# Patient Record
Sex: Female | Born: 1935 | Race: White | Hispanic: No | Smoking: Never smoker
Health system: Southern US, Community
[De-identification: ages and names within clinical notes are randomized; demographics above are authoritative.]

## PROBLEM LIST (undated history)

## (undated) DIAGNOSIS — E785 Hyperlipidemia, unspecified: Secondary | ICD-10-CM

---

## 2011-08-01 ENCOUNTER — Emergency Department (HOSPITAL_BASED_OUTPATIENT_CLINIC_OR_DEPARTMENT_OTHER)
Admission: EM | Admit: 2011-08-01 | Discharge: 2011-08-01 | Disposition: A | Discharge status: Home / Self Care | Payer: Medicare Other | Attending: Emergency Medicine | Admitting: Emergency Medicine

## 2011-08-01 ENCOUNTER — Encounter (HOSPITAL_BASED_OUTPATIENT_CLINIC_OR_DEPARTMENT_OTHER): Payer: Self-pay | Admitting: Emergency Medicine

## 2011-08-01 DIAGNOSIS — X500XXA Overexertion from strenuous movement or load, initial encounter: Secondary | ICD-10-CM | POA: Insufficient documentation

## 2011-08-01 DIAGNOSIS — IMO0002 Reserved for concepts with insufficient information to code with codable children: Secondary | ICD-10-CM | POA: Insufficient documentation

## 2011-08-01 DIAGNOSIS — S76319A Strain of muscle, fascia and tendon of the posterior muscle group at thigh level, unspecified thigh, initial encounter: Secondary | ICD-10-CM

## 2011-08-01 DIAGNOSIS — Z79899 Other long term (current) drug therapy: Secondary | ICD-10-CM | POA: Insufficient documentation

## 2011-08-01 DIAGNOSIS — Y92009 Unspecified place in unspecified non-institutional (private) residence as the place of occurrence of the external cause: Secondary | ICD-10-CM | POA: Insufficient documentation

## 2011-08-01 DIAGNOSIS — E785 Hyperlipidemia, unspecified: Secondary | ICD-10-CM | POA: Insufficient documentation

## 2011-08-01 HISTORY — DX: Hyperlipidemia, unspecified: E78.5

## 2011-08-01 MED ORDER — DIAZEPAM 5 MG PO TABS: 5.0000 mg | ORAL_TABLET | Freq: Three times a day (TID) | ORAL | Status: AC | PRN

## 2011-08-01 MED ORDER — NAPROXEN 375 MG PO TABS: 375.0000 mg | ORAL_TABLET | Freq: Two times a day (BID) | ORAL | Status: AC

## 2011-08-01 NOTE — ED Notes (Signed)
Pt. Bed 1 complains of upper left leg pain. States that she was outdoors when the injury occurred.

## 2011-08-01 NOTE — ED Provider Notes (Signed)
History     CSN: [id number]  Arrival date & time 08/01/11  1138   First MD Initiated Contact with Patient 08/01/11 1212      Chief Complaint  Patient presents with  . Leg Injury    (Consider location/radiation/quality/duration/timing/severity/associated sxs/prior treatment) HPI Comments: Patient presents for evaluation of left posterior thigh pain, worse with ambulation and movement, that she sustained proximally 4 days ago while she was lifting a bird bath in her yard. The patient has been able to walk subsequently has normal strength in her lower extremity, and denies any back pain. She denies any numbness or weakness in the lower extremity.  Patient is a 78 y.o. female presenting with leg pain. The history is provided by the patient.  Leg Pain  Incident onset: 4 days ago the patient was working in her yard and tried to lift a bird bath, experiencing acute pain in the left buttock and posterior thigh. Pain has been intermittent subsequently. The incident occurred at home. Injury mechanism: Lifting. The pain is present in the left thigh. The quality of the pain is described as aching. The pain is moderate. The pain has been fluctuating since onset. Pertinent negatives include no numbness, no inability to bear weight, no loss of motion, no muscle weakness, no loss of sensation and no tingling. She reports no foreign bodies present. The symptoms are aggravated by activity. She has tried heat and NSAIDs for the symptoms. The treatment provided moderate relief.    Past Medical History  Diagnosis Date  . Hyperlipidemia     History reviewed. No pertinent past surgical history.  History reviewed. No pertinent family history.  History  Substance Use Topics  . Smoking status: Not on file  . Smokeless tobacco: Not on file  . Alcohol Use:     OB History    Grav Para Term Preterm Abortions TAB SAB Ect Mult Living                  Review of Systems  Constitutional: Negative for  activity change and fatigue.  HENT: Negative for neck pain.   Respiratory: Negative for shortness of breath.   Cardiovascular: Negative for chest pain.  Gastrointestinal: Negative for abdominal pain.  Musculoskeletal: Positive for myalgias. Negative for back pain, joint swelling, arthralgias and gait problem.  Skin: Negative.   Neurological: Negative for tingling, weakness and numbness.  Hematological: Does not bruise/bleed easily.  Psychiatric/Behavioral: Negative.     Allergies  Morphine and related  Home Medications   Current Outpatient Rx  Name Route Sig Dispense Refill  . ASPIRIN 81 MG PO TABS Oral Take 81 mg by mouth daily.    Marland Kitchen CALCIUM CITRATE-VITAMIN D 200-200 MG-UNIT PO TABS Oral Take 3 tablets by mouth daily.    Marland Kitchen EZETIMIBE 10 MG PO TABS Oral Take 10 mg by mouth daily.    . OMEGA-3 FATTY ACIDS 1000 MG PO CAPS Oral Take 1,000 mg by mouth 3 (three) times daily.    . MULTI-VITAMIN/MINERALS PO TABS Oral Take 1 tablet by mouth daily.    Marland Kitchen DIAZEPAM 5 MG PO TABS Oral Take 1 tablet (5 mg total) by mouth every 8 (eight) hours as needed (Muscle spasm - use caution if taking this medication as it can cause drowsiness). 12 tablet 0  . NAPROXEN 375 MG PO TABS Oral Take 1 tablet (375 mg total) by mouth 2 (two) times daily with a meal. As needed for pain 20 tablet 0    BP 151/82  Pulse 65  Temp(Src) 97.2 F (36.2 C) (Oral)  Resp 20  Ht 5\' 5"  (1.651 m)  Wt 146 lb (66.225 kg)  BMI 24.30 kg/m2  SpO2 97%  Physical Exam  Nursing note and vitals reviewed. Constitutional: She is oriented to person, place, and time. She appears well-developed and well-nourished. No distress.  HENT:  Head: Normocephalic and atraumatic.  Eyes: EOM are normal. Pupils are equal, round, and reactive to light.  Neck: Normal range of motion. Neck supple.  Cardiovascular: Normal rate, regular rhythm, normal heart sounds and intact distal pulses.   Pulmonary/Chest: Effort normal and breath sounds normal. No  accessory muscle usage. No respiratory distress.  Abdominal: Soft. Normal appearance and bowel sounds are normal. She exhibits no distension. There is no tenderness.  Musculoskeletal: Normal range of motion. She exhibits no edema and no tenderness.       Left upper leg: Normal. She exhibits no tenderness, no bony tenderness, no swelling, no edema and no deformity.  Neurological: She is alert and oriented to person, place, and time. She has normal strength and normal reflexes. She displays normal reflexes. No cranial nerve deficit. She exhibits normal muscle tone. Coordination normal. GCS eye subscore is 4. GCS verbal subscore is 5. GCS motor subscore is 6.  Skin: Skin is warm and dry. No rash noted. No erythema. No pallor.  Psychiatric: She has a normal mood and affect. Her behavior is normal. Judgment and thought content normal.    ED Course  Procedures (including critical care time)  Labs Reviewed - No data to display No results found.   1. Hamstring muscle strain       MDM  The patient has full intact range of motion through all joints in the lower extremity, has normal strength and sensation throughout the lower extremity, ambulates independently without difficulty, antalgic gait, or unsteadiness. She currently is having no pain. Based on the mechanism of injury and where she localizes her pain to, believe this is a muscle strain. I advised her that light activity like walking will help, to avoid heavy lifting over the next 5 days, and we'll treat her with a nonsteroidal anti-inflammatory medication as well as a low-dose muscle relaxant as needed. The patient states her understanding of and agreement with the plan of care.        Felisa Bonier, MD 08/01/11 1323

## 2011-08-01 NOTE — Discharge Instructions (Signed)
Muscle Strain A muscle strain, or pulled muscle, occurs when a muscle is over-stretched. A small number of muscle fibers may also be torn. This is especially common in athletes. This happens when a sudden violent force placed on a muscle pushes it past its capacity. Usually, recovery from a pulled muscle takes 1 to 2 weeks. But complete healing will take 5 to 6 weeks. There are millions of muscle fibers. Following injury, your body will usually return to normal quickly. HOME CARE INSTRUCTIONS   While awake, apply ice to the sore muscle for 15 to 20 minutes each hour for the first 2 days. Put ice in a plastic bag and place a towel between the bag of ice and your skin.   Do not use the pulled muscle for several days. Do not use the muscle if you have pain.   You may wrap the injured area with an elastic bandage for comfort. Be careful not to bind it too tightly. This may interfere with blood circulation.   Only take over-the-counter or prescription medicines for pain, discomfort, or fever as directed by your caregiver. Do not use aspirin as this will increase bleeding (bruising) at injury site.   Warming up before exercise helps prevent muscle strains.  SEEK MEDICAL CARE IF:  There is increased pain or swelling in the affected area. MAKE SURE YOU:   Understand these instructions.   Will watch your condition.   Will get help right away if you are not doing well or get worse.  Document Released: 05/06/2005 Document Revised: 04/25/2011 Document Reviewed: 12/03/2006 [facility] Patient Information 2012 [place], [place].Sprains Sprains are painful injuries to joints as a result of partial or complete tearing of ligaments. HOME CARE INSTRUCTIONS   For the first 24 hours, keep the injured limb raised on 2 pillows while lying down.   Apply ice bags about every 2 hours for 20 to 30 minutes, while awake, to the injured area for the first 24 hours. Then apply as directed by your caregiver. Place the ice  in a plastic bag with a towel around it to prevent frostbite to the skin.   Only take over-the-counter or prescription medicines for pain, discomfort, or fever as directed by your caregiver.   If an ace bandage (a stretchy, elastic wrapping bandage) has been applied today, remove and reapply every 3 to 4 hours. Apply firm enough to keep swelling down. Donot apply tightly. Watch fingers or toes for swelling, bluish discoloration, coldness, numbness, or excessive pain. If any of these problems (symptoms) occur, remove the ace bandage and reapply it more loosely. Contact your caregiver or return to this location if these symptoms persist.  Persistent pain and inability to use the injured area for more than 2 to 3 days are warning signs. See a caregiver for a follow-up visit as soon as possible. A hairline fracture (broken bone) may not show on X-rays. Persistent pain and swelling indicate that further evaluation, use of crutches, and/or more X-rays are needed. X-rays may sometimes not show a small fracture until a week or ten days later. Make a follow-up appointment with your own caregiver or to whom we have referred you. A specialist in reading X-rays(radiologist) will re-read your X-rays. Make sure you know how to obtain your X-ray results. Do not assume everything is normal if you do not hear from [place]. SEEK IMMEDIATE MEDICAL CARE IF:  You develop severe pain or more swelling.   The pain is not controlled with medicine.   Your  skin or nails below the injury turn blue or grey or feel cold or numb.  Document Released: 05/03/2000 Document Revised: 04/25/2011 Document Reviewed: 12/21/2007 [facility] Patient Information 2012 [place], [place].  RESOURCE GUIDE  Dental Problems  Patients with Medicaid: [facility] [phone]                                           [phone] [address] [employer] [address] Phone:  [phone]                                                   Phone:  [phone]  If unable to pay or uninsured, contact:  [facility] or [facility] to become qualified for the adult dental clinic.  Chronic Pain Problems Contact [facility]  [phone] Patients need to be referred by their primary care doctor.  Insufficient Money for Medicine Contact [employer] Way:  call "211" or [facility] 814-734-6077.  No Primary Care Doctor Call [facility]  [phone] Other agencies that provide inexpensive medical care    [facility]  [phone]    [facility]  [phone]    [facility]  [phone]    [facility]  [phone]    [facility]  [phone]    [facility]  [phone]  Psychological Services [facility]  [phone] [facility]  [phone] [facility] Mental [facility]   [phone] (emergency services [phone])  Substance Abuse Resources [facility]  [phone] [facility] [phone] [facility] [phone] Floydene Flock [phone] Residential & Outpatient Substance Abuse Program  [phone]  Abuse/Neglect [facility] Child Abuse Hotline 212-467-9239 [facility] Child Abuse Hotline 831-338-2459 (After Hours)  Emergency Shelter [facility] [phone]  Maternity Homes [facility] at [facility] [place] [facility] [phone] [facility] [phone]  MRSA Hotline #:   714-882-8634    [facility] Resources  [facility] [place]     [employer]                          [facility] Dept. [address] [place]                       [address]      [address]  [place]                                                Cristobal Goldmann Phone:  [phone]  Phone:  [phone]                 Phone:  [phone]  [facility] Phone:   [phone]  [facility] Child Abuse Hotline [phone] (After Hours)

## 2011-11-14 DIAGNOSIS — Z961 Presence of intraocular lens: Secondary | ICD-10-CM | POA: Insufficient documentation

## 2015-05-18 DIAGNOSIS — M3500 Sicca syndrome, unspecified: Secondary | ICD-10-CM | POA: Insufficient documentation

## 2015-05-18 DIAGNOSIS — M810 Age-related osteoporosis without current pathological fracture: Secondary | ICD-10-CM | POA: Insufficient documentation

## 2015-05-18 DIAGNOSIS — F411 Generalized anxiety disorder: Secondary | ICD-10-CM | POA: Insufficient documentation

## 2015-05-18 DIAGNOSIS — N182 Chronic kidney disease, stage 2 (mild): Secondary | ICD-10-CM | POA: Insufficient documentation

## 2018-11-03 ENCOUNTER — Other Ambulatory Visit: Payer: Self-pay

## 2018-11-03 ENCOUNTER — Emergency Department (HOSPITAL_COMMUNITY): Payer: Medicare HMO

## 2018-11-03 ENCOUNTER — Encounter (HOSPITAL_COMMUNITY): Payer: Self-pay | Admitting: Emergency Medicine

## 2018-11-03 ENCOUNTER — Emergency Department (HOSPITAL_COMMUNITY)
Admission: EM | Admit: 2018-11-03 | Discharge: 2018-11-03 | Disposition: A | Discharge status: Home / Self Care | Payer: Medicare HMO | Attending: Emergency Medicine | Admitting: Emergency Medicine

## 2018-11-03 DIAGNOSIS — W01190A Fall on same level from slipping, tripping and stumbling with subsequent striking against furniture, initial encounter: Secondary | ICD-10-CM | POA: Insufficient documentation

## 2018-11-03 DIAGNOSIS — Y939 Activity, unspecified: Secondary | ICD-10-CM | POA: Diagnosis not present

## 2018-11-03 DIAGNOSIS — Y999 Unspecified external cause status: Secondary | ICD-10-CM | POA: Insufficient documentation

## 2018-11-03 DIAGNOSIS — Y929 Unspecified place or not applicable: Secondary | ICD-10-CM | POA: Diagnosis not present

## 2018-11-03 DIAGNOSIS — S20212A Contusion of left front wall of thorax, initial encounter: Secondary | ICD-10-CM | POA: Diagnosis not present

## 2018-11-03 DIAGNOSIS — Z7982 Long term (current) use of aspirin: Secondary | ICD-10-CM | POA: Diagnosis not present

## 2018-11-03 DIAGNOSIS — S29001A Unspecified injury of muscle and tendon of front wall of thorax, initial encounter: Secondary | ICD-10-CM | POA: Diagnosis present

## 2018-11-03 DIAGNOSIS — Z79899 Other long term (current) drug therapy: Secondary | ICD-10-CM | POA: Diagnosis not present

## 2018-11-03 DIAGNOSIS — J449 Chronic obstructive pulmonary disease, unspecified: Secondary | ICD-10-CM | POA: Insufficient documentation

## 2018-11-03 MED ORDER — LIDOCAINE 5 % EX PTCH: 1.0000 | MEDICATED_PATCH | CUTANEOUS | 0 refills | Status: DC

## 2018-11-03 MED ORDER — IBUPROFEN 200 MG PO TABS
600.0000 mg | ORAL_TABLET | Freq: Once | ORAL | Status: AC
  Administered 2018-11-03: 08:00:00 600 mg via ORAL
  Filled 2018-11-03: qty 3

## 2018-11-03 MED ORDER — LIDOCAINE 5 % EX PTCH
1.0000 | MEDICATED_PATCH | CUTANEOUS | Status: DC
  Administered 2018-11-03: 1 via TRANSDERMAL
  Filled 2018-11-03: qty 1

## 2018-11-03 NOTE — Discharge Instructions (Addendum)
There are no rib fractures.  However you likely have contusion to your ribs.  Take lidocaine patch as prescribed.  Continue Motrin at home.  Use incentive spirometry as often as possible.

## 2018-11-03 NOTE — ED Provider Notes (Signed)
[facility] DEPT Provider Note   CSN: [id number] Arrival date & time: 11/03/18  0636    History   Chief Complaint Chief Complaint  Patient presents with  . Fall    HPI Lauren Davidson is a 85 y.o. female.     The history is provided by the patient.  Chest Pain Pain location:  L chest Pain quality: aching   Pain radiates to:  Does not radiate Pain severity:  Mild Onset quality:  Gradual Duration:  3 days Timing:  Intermittent Progression:  Waxing and waning Chronicity:  New Context comment:  Patient hit left side of ribs on a chair after fall three days ago.  Relieved by: motrin  Worsened by:  Deep breathing and movement Associated symptoms: no abdominal pain, no back pain, no cough, no fever, no palpitations, no shortness of breath and no vomiting   Risk factors: high cholesterol     Past Medical History:  Diagnosis Date  . Hyperlipidemia     There are no active problems to display for this patient.   History reviewed. No pertinent surgical history.   OB History   No obstetric history on file.      Home Medications    Prior to Admission medications   Medication Sig Start Date End Date Taking? Authorizing Provider  aspirin 81 MG tablet Take 81 mg by mouth daily.    [provider]  calcium citrate-vitamin D 200-200 MG-UNIT TABS Take 3 tablets by mouth daily.    [provider]  ezetimibe (ZETIA) 10 MG tablet Take 10 mg by mouth daily.    [provider]  fish oil-omega-3 fatty acids 1000 MG capsule Take 1,000 mg by mouth 3 (three) times daily.    [provider]  Multiple Vitamins-Minerals (MULTIVITAMIN WITH MINERALS) tablet Take 1 tablet by mouth daily.    [provider]    Family History History reviewed. No pertinent family history.  Social History Social History   Tobacco Use  . Smoking status: Never Smoker  . Smokeless tobacco: Never Used  Substance Use Topics  .  Alcohol use: Never    Frequency: Never  . Drug use: Never     Allergies   Morphine and related   Review of Systems Review of Systems  Constitutional: Negative for chills and fever.  HENT: Negative for ear pain and sore throat.   Eyes: Negative for pain and visual disturbance.  Respiratory: Negative for cough and shortness of breath.   Cardiovascular: Positive for chest pain. Negative for palpitations.  Gastrointestinal: Negative for abdominal pain and vomiting.  Genitourinary: Negative for dysuria and hematuria.  Musculoskeletal: Negative for arthralgias and back pain.  Skin: Negative for color change and rash.  Neurological: Negative for seizures and syncope.  All other systems reviewed and are negative.    Physical Exam Updated Vital Signs  ED Triage Vitals  Enc Vitals Group     BP 11/03/18 0700 (!) 183/73     Pulse Rate 11/03/18 0700 88     Resp --      Temp --      Temp src --      SpO2 11/03/18 0641 100 %     Weight 11/03/18 0645 140 lb (63.5 kg)     Height 11/03/18 0645 5\' 4"  (1.626 m)     Head Circumference --      Peak Flow --      Pain Score 11/03/18 0645 9  Pain Loc --      Pain Edu? --      Excl. in GC? --     Physical Exam Vitals signs and nursing note reviewed.  Constitutional:      General: She is not in acute distress.    Appearance: She is well-developed.  HENT:     Head: Normocephalic and atraumatic.     Mouth/Throat:     Mouth: Mucous membranes are moist.  Eyes:     Extraocular Movements: Extraocular movements intact.     Conjunctiva/sclera: Conjunctivae normal.     Pupils: Pupils are equal, round, and reactive to light.  Neck:     Musculoskeletal: Normal range of motion and neck supple.  Cardiovascular:     Rate and Rhythm: Normal rate and regular rhythm.     Pulses: Normal pulses.     Heart sounds: Normal heart sounds. No murmur.  Pulmonary:     Effort: Pulmonary effort is normal. No respiratory distress.     Breath sounds:  Normal breath sounds.  Abdominal:     Palpations: Abdomen is soft.     Tenderness: There is no abdominal tenderness.  Musculoskeletal:        General: Tenderness (TTP over anterior left ribs) present.     Comments: No midline spinal tenderness  Skin:    General: Skin is warm and dry.  Neurological:     Mental Status: She is alert.      ED Treatments / Results  Labs (all labs ordered are listed, but only abnormal results are displayed) Labs Reviewed - No data to display  EKG None  Radiology Dg Chest 2 View  Result Date: 11/03/2018 CLINICAL DATA:  Fall.  Left lower rib pain. EXAM: CHEST - 2 VIEW COMPARISON:  Chest 05/26/2017.  Thoracic spine 12/18/2016 FINDINGS: Heart size mildly enlarged. Negative for heart failure. Lungs are hyperinflated with changes of COPD and mild scarring. No acute infiltrate or effusion. Atherosclerotic aortic arch. Chronic fracture deformity right humerus Moderate compression fracture approximately T6 was not present on 12/18/2016. No other thoracic fractures. No displaced rib fracture. IMPRESSION: COPD and mild scarring.  No acute cardiopulmonary abnormality Moderate compression fracture approximately T6 of indeterminate age but not present in 2018. Electronically Signed   By: Marlan Palauharles  Clark M.D.   On: 11/03/2018 08:05    Procedures Procedures (including critical care time)  Medications Ordered in ED Medications  lidocaine (LIDODERM) 5 % 1 patch (1 patch Transdermal Patch Applied 11/03/18 0717)  ibuprofen (ADVIL) tablet 600 mg (600 mg Oral Given 11/03/18 [id number])     Initial Impression / Assessment and Plan / ED Course  I have reviewed the triage vital signs and the nursing notes.  Pertinent labs & imaging results that were available during my care of the patient were reviewed by me and considered in my medical decision making (see chart for details).     Lauren Davidson is a 85 year old female with history of COPD who presents to the ED after fall  with left-sided rib pain.  Patient with normal vitals.  No fever.  Patient fell 3 days ago and has had ongoing left-sided rib pain especially when she moves or takes a deep breath.  Patient with no signs of respiratory distress.  Equal breath sounds bilaterally.  Chest x-ray showed no fracture, no pneumothorax.  She has age-indeterminate T6 compression fracture that has not been previously seen however she has no tenderness in this area.  Patient felt better after Motrin, lidocaine patch.  Will prescribe lidocaine patches.  She has been taking Motrin at home.  Discharged in the ED in good condition and told to return to the ED if symptoms worsen.  Patient likely with rib contusion. Incentive spiro ordered.  This chart was dictated using voice recognition software.  Despite best efforts to proofread,  errors can occur which can change the documentation meaning.    Final Clinical Impressions(s) / ED Diagnoses   Final diagnoses:  Contusion of rib on left side, initial encounter    ED Discharge Orders    None       Virgina NorfolkCuratolo, Kimm Ungaro, DO 11/03/18 [id number]

## 2018-11-03 NOTE — ED Notes (Signed)
[facility] has been contacted regarding patient transport.  

## 2018-11-03 NOTE — ED Notes (Signed)
Bed: WA21 Expected date:  Expected time:  Means of arrival:  Comments: 85 yo F/ left rib pain from fall

## 2018-11-03 NOTE — ED Triage Notes (Addendum)
[facility] transported pt from [employer] [facility] [place] to [facility] ED and reports the following:  Pt fell on a chair on 11/01/18, since then the pain has increased to a sharp 9/10 rib pain on left side. No deformity or swelling. Abdomen soft and non-tender. Ambulatory, can move around.

## 2019-03-01 DIAGNOSIS — K219 Gastro-esophageal reflux disease without esophagitis: Secondary | ICD-10-CM | POA: Insufficient documentation

## 2020-05-23 DIAGNOSIS — Z20828 Contact with and (suspected) exposure to other viral communicable diseases: Secondary | ICD-10-CM | POA: Diagnosis not present

## 2020-05-23 DIAGNOSIS — Z1159 Encounter for screening for other viral diseases: Secondary | ICD-10-CM | POA: Diagnosis not present

## 2020-05-26 DIAGNOSIS — Z20828 Contact with and (suspected) exposure to other viral communicable diseases: Secondary | ICD-10-CM | POA: Diagnosis not present

## 2020-05-26 DIAGNOSIS — Z1159 Encounter for screening for other viral diseases: Secondary | ICD-10-CM | POA: Diagnosis not present

## 2020-06-01 DIAGNOSIS — Z20828 Contact with and (suspected) exposure to other viral communicable diseases: Secondary | ICD-10-CM | POA: Diagnosis not present

## 2020-06-06 DIAGNOSIS — Z20828 Contact with and (suspected) exposure to other viral communicable diseases: Secondary | ICD-10-CM | POA: Diagnosis not present

## 2020-06-08 DIAGNOSIS — Z20828 Contact with and (suspected) exposure to other viral communicable diseases: Secondary | ICD-10-CM | POA: Diagnosis not present

## 2020-06-08 DIAGNOSIS — R4789 Other speech disturbances: Secondary | ICD-10-CM | POA: Diagnosis not present

## 2020-06-09 DIAGNOSIS — R4789 Other speech disturbances: Secondary | ICD-10-CM | POA: Diagnosis not present

## 2020-06-12 DIAGNOSIS — H353131 Nonexudative age-related macular degeneration, bilateral, early dry stage: Secondary | ICD-10-CM | POA: Diagnosis not present

## 2020-06-12 DIAGNOSIS — H35343 Macular cyst, hole, or pseudohole, bilateral: Secondary | ICD-10-CM | POA: Diagnosis not present

## 2020-06-12 DIAGNOSIS — R4789 Other speech disturbances: Secondary | ICD-10-CM | POA: Diagnosis not present

## 2020-06-12 DIAGNOSIS — Z20828 Contact with and (suspected) exposure to other viral communicable diseases: Secondary | ICD-10-CM | POA: Diagnosis not present

## 2020-06-12 DIAGNOSIS — H35373 Puckering of macula, bilateral: Secondary | ICD-10-CM | POA: Diagnosis not present

## 2020-06-13 DIAGNOSIS — R4789 Other speech disturbances: Secondary | ICD-10-CM | POA: Diagnosis not present

## 2020-06-15 DIAGNOSIS — Z20828 Contact with and (suspected) exposure to other viral communicable diseases: Secondary | ICD-10-CM | POA: Diagnosis not present

## 2020-06-15 DIAGNOSIS — R4789 Other speech disturbances: Secondary | ICD-10-CM | POA: Diagnosis not present

## 2020-06-19 DIAGNOSIS — Z20828 Contact with and (suspected) exposure to other viral communicable diseases: Secondary | ICD-10-CM | POA: Diagnosis not present

## 2020-06-19 DIAGNOSIS — R4789 Other speech disturbances: Secondary | ICD-10-CM | POA: Diagnosis not present

## 2020-06-20 DIAGNOSIS — R4789 Other speech disturbances: Secondary | ICD-10-CM | POA: Diagnosis not present

## 2020-06-22 DIAGNOSIS — R4789 Other speech disturbances: Secondary | ICD-10-CM | POA: Diagnosis not present

## 2020-06-22 DIAGNOSIS — Z20828 Contact with and (suspected) exposure to other viral communicable diseases: Secondary | ICD-10-CM | POA: Diagnosis not present

## 2020-06-23 ENCOUNTER — Other Ambulatory Visit: Payer: Self-pay

## 2020-06-23 ENCOUNTER — Ambulatory Visit (INDEPENDENT_AMBULATORY_CARE_PROVIDER_SITE_OTHER): Payer: Medicare HMO | Admitting: Podiatry

## 2020-06-23 DIAGNOSIS — B351 Tinea unguium: Secondary | ICD-10-CM

## 2020-06-23 DIAGNOSIS — M79675 Pain in left toe(s): Secondary | ICD-10-CM | POA: Diagnosis not present

## 2020-06-23 DIAGNOSIS — M79674 Pain in right toe(s): Secondary | ICD-10-CM

## 2020-06-26 DIAGNOSIS — R4789 Other speech disturbances: Secondary | ICD-10-CM | POA: Diagnosis not present

## 2020-06-26 DIAGNOSIS — Z20828 Contact with and (suspected) exposure to other viral communicable diseases: Secondary | ICD-10-CM | POA: Diagnosis not present

## 2020-06-27 ENCOUNTER — Encounter: Payer: Self-pay | Admitting: Podiatry

## 2020-06-27 DIAGNOSIS — I340 Nonrheumatic mitral (valve) insufficiency: Secondary | ICD-10-CM | POA: Diagnosis not present

## 2020-06-27 DIAGNOSIS — N1831 Chronic kidney disease, stage 3a: Secondary | ICD-10-CM | POA: Diagnosis not present

## 2020-06-27 NOTE — Progress Notes (Signed)
  Subjective:  Patient ID: Lauren Davidson, female    DOB: 1921/10/07,  MRN: [id number]  Chief Complaint  Patient presents with  . Nail Problem    Bilateral hallux nails possible ingrown Right hallux nail is curving inward    87 y.o. female returns for the above complaint.  Patient presents with thickened elongated dystrophic toenails x10.  Patient states they are ingrowing a little been causing her pain.  She would like to have the nails debrided down.  She is not able to do it herself.  She denies any other acute complaints.  She has not seen anyone else prior to see me.  She is not a diabetic  Objective:  There were no vitals filed for this visit. Podiatric Exam: Vascular: dorsalis pedis and posterior tibial pulses are palpable bilateral. Capillary return is immediate. Temperature gradient is WNL. Skin turgor WNL  Sensorium: Normal Semmes Weinstein monofilament test. Normal tactile sensation bilaterally. Nail Exam: Pt has thick disfigured discolored nails with subungual debris noted bilateral entire nail hallux through fifth toenails.  Pain on palpation to the nails. Ulcer Exam: There is no evidence of ulcer or pre-ulcerative changes or infection. Orthopedic Exam: Muscle tone and strength are WNL. No limitations in general ROM. No crepitus or effusions noted. HAV  B/L.  Hammer toes 2-5  B/L. Skin: No Porokeratosis. No infection or ulcers    Assessment & Plan:   1. Pain due to onychomycosis of toenails of both feet     Patient was evaluated and treated and all questions answered.  Onychomycosis with pain  -Nails palliatively debrided as below. -Educated on self-care  Procedure: Nail Debridement Rationale: pain  Type of Debridement: manual, sharp debridement. Instrumentation: Nail nipper, rotary burr. Number of Nails: 10  Procedures and Treatment: Consent by patient was obtained for treatment procedures. The patient understood the discussion of treatment and procedures well. All  questions were answered thoroughly reviewed. Debridement of mycotic and hypertrophic toenails, 1 through 5 bilateral and clearing of subungual debris. No ulceration, no infection noted.  Return Visit-Office Procedure: Patient instructed to return to the office for a follow up visit 3 months for continued evaluation and treatment.  Nicholes Rough, DPM    No follow-ups on file.

## 2020-06-28 DIAGNOSIS — R4789 Other speech disturbances: Secondary | ICD-10-CM | POA: Diagnosis not present

## 2020-06-29 DIAGNOSIS — R4789 Other speech disturbances: Secondary | ICD-10-CM | POA: Diagnosis not present

## 2020-06-29 DIAGNOSIS — Z20828 Contact with and (suspected) exposure to other viral communicable diseases: Secondary | ICD-10-CM | POA: Diagnosis not present

## 2020-07-03 DIAGNOSIS — Z20828 Contact with and (suspected) exposure to other viral communicable diseases: Secondary | ICD-10-CM | POA: Diagnosis not present

## 2020-07-04 DIAGNOSIS — R4789 Other speech disturbances: Secondary | ICD-10-CM | POA: Diagnosis not present

## 2020-07-06 DIAGNOSIS — R4789 Other speech disturbances: Secondary | ICD-10-CM | POA: Diagnosis not present

## 2020-07-06 DIAGNOSIS — Z20828 Contact with and (suspected) exposure to other viral communicable diseases: Secondary | ICD-10-CM | POA: Diagnosis not present

## 2020-07-10 DIAGNOSIS — Z20828 Contact with and (suspected) exposure to other viral communicable diseases: Secondary | ICD-10-CM | POA: Diagnosis not present

## 2020-07-11 DIAGNOSIS — R4789 Other speech disturbances: Secondary | ICD-10-CM | POA: Diagnosis not present

## 2020-07-12 DIAGNOSIS — M6281 Muscle weakness (generalized): Secondary | ICD-10-CM | POA: Diagnosis not present

## 2020-07-13 DIAGNOSIS — Z20828 Contact with and (suspected) exposure to other viral communicable diseases: Secondary | ICD-10-CM | POA: Diagnosis not present

## 2020-07-13 DIAGNOSIS — R4789 Other speech disturbances: Secondary | ICD-10-CM | POA: Diagnosis not present

## 2020-07-17 DIAGNOSIS — Z20828 Contact with and (suspected) exposure to other viral communicable diseases: Secondary | ICD-10-CM | POA: Diagnosis not present

## 2020-07-17 DIAGNOSIS — M6281 Muscle weakness (generalized): Secondary | ICD-10-CM | POA: Diagnosis not present

## 2020-07-18 DIAGNOSIS — R4789 Other speech disturbances: Secondary | ICD-10-CM | POA: Diagnosis not present

## 2020-07-19 DIAGNOSIS — M6281 Muscle weakness (generalized): Secondary | ICD-10-CM | POA: Diagnosis not present

## 2020-07-20 DIAGNOSIS — Z20828 Contact with and (suspected) exposure to other viral communicable diseases: Secondary | ICD-10-CM | POA: Diagnosis not present

## 2020-07-20 DIAGNOSIS — R4789 Other speech disturbances: Secondary | ICD-10-CM | POA: Diagnosis not present

## 2020-07-21 DIAGNOSIS — M6281 Muscle weakness (generalized): Secondary | ICD-10-CM | POA: Diagnosis not present

## 2020-07-24 DIAGNOSIS — M6281 Muscle weakness (generalized): Secondary | ICD-10-CM | POA: Diagnosis not present

## 2020-07-24 DIAGNOSIS — Z20828 Contact with and (suspected) exposure to other viral communicable diseases: Secondary | ICD-10-CM | POA: Diagnosis not present

## 2020-07-25 DIAGNOSIS — R4789 Other speech disturbances: Secondary | ICD-10-CM | POA: Diagnosis not present

## 2020-07-26 DIAGNOSIS — M6281 Muscle weakness (generalized): Secondary | ICD-10-CM | POA: Diagnosis not present

## 2020-07-27 DIAGNOSIS — R4789 Other speech disturbances: Secondary | ICD-10-CM | POA: Diagnosis not present

## 2020-07-28 DIAGNOSIS — M6281 Muscle weakness (generalized): Secondary | ICD-10-CM | POA: Diagnosis not present

## 2020-08-02 DIAGNOSIS — M6281 Muscle weakness (generalized): Secondary | ICD-10-CM | POA: Diagnosis not present

## 2020-08-07 DIAGNOSIS — Z20828 Contact with and (suspected) exposure to other viral communicable diseases: Secondary | ICD-10-CM | POA: Diagnosis not present

## 2020-08-09 DIAGNOSIS — M6281 Muscle weakness (generalized): Secondary | ICD-10-CM | POA: Diagnosis not present

## 2020-08-14 DIAGNOSIS — Z20828 Contact with and (suspected) exposure to other viral communicable diseases: Secondary | ICD-10-CM | POA: Diagnosis not present

## 2020-08-14 DIAGNOSIS — M6281 Muscle weakness (generalized): Secondary | ICD-10-CM | POA: Diagnosis not present

## 2020-08-16 DIAGNOSIS — M6281 Muscle weakness (generalized): Secondary | ICD-10-CM | POA: Diagnosis not present

## 2020-08-18 DIAGNOSIS — M6281 Muscle weakness (generalized): Secondary | ICD-10-CM | POA: Diagnosis not present

## 2020-08-21 DIAGNOSIS — Z20828 Contact with and (suspected) exposure to other viral communicable diseases: Secondary | ICD-10-CM | POA: Diagnosis not present

## 2020-08-28 DIAGNOSIS — Z20828 Contact with and (suspected) exposure to other viral communicable diseases: Secondary | ICD-10-CM | POA: Diagnosis not present

## 2020-09-04 DIAGNOSIS — Z20828 Contact with and (suspected) exposure to other viral communicable diseases: Secondary | ICD-10-CM | POA: Diagnosis not present

## 2020-09-11 DIAGNOSIS — Z20828 Contact with and (suspected) exposure to other viral communicable diseases: Secondary | ICD-10-CM | POA: Diagnosis not present

## 2020-09-18 DIAGNOSIS — Z20828 Contact with and (suspected) exposure to other viral communicable diseases: Secondary | ICD-10-CM | POA: Diagnosis not present

## 2020-09-20 ENCOUNTER — Ambulatory Visit: Payer: Medicare HMO | Admitting: Podiatry

## 2020-09-25 DIAGNOSIS — Z20828 Contact with and (suspected) exposure to other viral communicable diseases: Secondary | ICD-10-CM | POA: Diagnosis not present

## 2020-09-29 DIAGNOSIS — N1831 Chronic kidney disease, stage 3a: Secondary | ICD-10-CM | POA: Diagnosis not present

## 2020-09-29 DIAGNOSIS — E538 Deficiency of other specified B group vitamins: Secondary | ICD-10-CM | POA: Diagnosis not present

## 2020-09-29 DIAGNOSIS — N393 Stress incontinence (female) (male): Secondary | ICD-10-CM | POA: Diagnosis not present

## 2020-09-29 DIAGNOSIS — I340 Nonrheumatic mitral (valve) insufficiency: Secondary | ICD-10-CM | POA: Diagnosis not present

## 2020-10-02 DIAGNOSIS — Z20828 Contact with and (suspected) exposure to other viral communicable diseases: Secondary | ICD-10-CM | POA: Diagnosis not present

## 2020-10-09 DIAGNOSIS — Z20828 Contact with and (suspected) exposure to other viral communicable diseases: Secondary | ICD-10-CM | POA: Diagnosis not present

## 2020-10-16 DIAGNOSIS — Z20828 Contact with and (suspected) exposure to other viral communicable diseases: Secondary | ICD-10-CM | POA: Diagnosis not present

## 2020-10-23 DIAGNOSIS — Z20828 Contact with and (suspected) exposure to other viral communicable diseases: Secondary | ICD-10-CM | POA: Diagnosis not present

## 2020-10-26 DIAGNOSIS — Z20828 Contact with and (suspected) exposure to other viral communicable diseases: Secondary | ICD-10-CM | POA: Diagnosis not present

## 2020-10-30 DIAGNOSIS — Z20828 Contact with and (suspected) exposure to other viral communicable diseases: Secondary | ICD-10-CM | POA: Diagnosis not present

## 2020-10-31 DIAGNOSIS — L570 Actinic keratosis: Secondary | ICD-10-CM | POA: Diagnosis not present

## 2020-10-31 DIAGNOSIS — Z859 Personal history of malignant neoplasm, unspecified: Secondary | ICD-10-CM | POA: Diagnosis not present

## 2020-10-31 DIAGNOSIS — L820 Inflamed seborrheic keratosis: Secondary | ICD-10-CM | POA: Diagnosis not present

## 2020-10-31 DIAGNOSIS — Z85828 Personal history of other malignant neoplasm of skin: Secondary | ICD-10-CM | POA: Diagnosis not present

## 2020-10-31 DIAGNOSIS — D1801 Hemangioma of skin and subcutaneous tissue: Secondary | ICD-10-CM | POA: Diagnosis not present

## 2020-10-31 DIAGNOSIS — L821 Other seborrheic keratosis: Secondary | ICD-10-CM | POA: Diagnosis not present

## 2020-11-02 DIAGNOSIS — Z20828 Contact with and (suspected) exposure to other viral communicable diseases: Secondary | ICD-10-CM | POA: Diagnosis not present

## 2020-11-06 DIAGNOSIS — Z20828 Contact with and (suspected) exposure to other viral communicable diseases: Secondary | ICD-10-CM | POA: Diagnosis not present

## 2020-11-09 DIAGNOSIS — Z20828 Contact with and (suspected) exposure to other viral communicable diseases: Secondary | ICD-10-CM | POA: Diagnosis not present

## 2020-11-13 DIAGNOSIS — Z20828 Contact with and (suspected) exposure to other viral communicable diseases: Secondary | ICD-10-CM | POA: Diagnosis not present

## 2020-11-16 DIAGNOSIS — Z20828 Contact with and (suspected) exposure to other viral communicable diseases: Secondary | ICD-10-CM | POA: Diagnosis not present

## 2020-11-21 DIAGNOSIS — Z20828 Contact with and (suspected) exposure to other viral communicable diseases: Secondary | ICD-10-CM | POA: Diagnosis not present

## 2020-11-23 DIAGNOSIS — Z20828 Contact with and (suspected) exposure to other viral communicable diseases: Secondary | ICD-10-CM | POA: Diagnosis not present

## 2020-11-27 DIAGNOSIS — Z20828 Contact with and (suspected) exposure to other viral communicable diseases: Secondary | ICD-10-CM | POA: Diagnosis not present

## 2020-12-04 DIAGNOSIS — Z20828 Contact with and (suspected) exposure to other viral communicable diseases: Secondary | ICD-10-CM | POA: Diagnosis not present

## 2020-12-18 DIAGNOSIS — Z20828 Contact with and (suspected) exposure to other viral communicable diseases: Secondary | ICD-10-CM | POA: Diagnosis not present

## 2020-12-21 DIAGNOSIS — Z20828 Contact with and (suspected) exposure to other viral communicable diseases: Secondary | ICD-10-CM | POA: Diagnosis not present

## 2020-12-25 DIAGNOSIS — Z20828 Contact with and (suspected) exposure to other viral communicable diseases: Secondary | ICD-10-CM | POA: Diagnosis not present

## 2020-12-28 DIAGNOSIS — Z20828 Contact with and (suspected) exposure to other viral communicable diseases: Secondary | ICD-10-CM | POA: Diagnosis not present

## 2021-01-01 DIAGNOSIS — I340 Nonrheumatic mitral (valve) insufficiency: Secondary | ICD-10-CM | POA: Diagnosis not present

## 2021-01-01 DIAGNOSIS — N1831 Chronic kidney disease, stage 3a: Secondary | ICD-10-CM | POA: Diagnosis not present

## 2021-01-01 DIAGNOSIS — H903 Sensorineural hearing loss, bilateral: Secondary | ICD-10-CM | POA: Diagnosis not present

## 2021-01-01 DIAGNOSIS — R682 Dry mouth, unspecified: Secondary | ICD-10-CM | POA: Diagnosis not present

## 2021-01-01 DIAGNOSIS — R531 Weakness: Secondary | ICD-10-CM | POA: Diagnosis not present

## 2021-01-08 DIAGNOSIS — Z20828 Contact with and (suspected) exposure to other viral communicable diseases: Secondary | ICD-10-CM | POA: Diagnosis not present

## 2021-01-11 DIAGNOSIS — Z8616 Personal history of COVID-19: Secondary | ICD-10-CM | POA: Diagnosis not present

## 2021-01-12 DIAGNOSIS — H35373 Puckering of macula, bilateral: Secondary | ICD-10-CM | POA: Diagnosis not present

## 2021-01-12 DIAGNOSIS — H5203 Hypermetropia, bilateral: Secondary | ICD-10-CM | POA: Diagnosis not present

## 2021-01-12 DIAGNOSIS — Z961 Presence of intraocular lens: Secondary | ICD-10-CM | POA: Diagnosis not present

## 2021-01-12 DIAGNOSIS — H04123 Dry eye syndrome of bilateral lacrimal glands: Secondary | ICD-10-CM | POA: Diagnosis not present

## 2021-01-15 DIAGNOSIS — Z20828 Contact with and (suspected) exposure to other viral communicable diseases: Secondary | ICD-10-CM | POA: Diagnosis not present

## 2021-01-18 DIAGNOSIS — Z20828 Contact with and (suspected) exposure to other viral communicable diseases: Secondary | ICD-10-CM | POA: Diagnosis not present

## 2021-01-22 DIAGNOSIS — Z20828 Contact with and (suspected) exposure to other viral communicable diseases: Secondary | ICD-10-CM | POA: Diagnosis not present

## 2021-01-25 DIAGNOSIS — Z20828 Contact with and (suspected) exposure to other viral communicable diseases: Secondary | ICD-10-CM | POA: Diagnosis not present

## 2021-02-01 DIAGNOSIS — Z20828 Contact with and (suspected) exposure to other viral communicable diseases: Secondary | ICD-10-CM | POA: Diagnosis not present

## 2021-02-01 DIAGNOSIS — L820 Inflamed seborrheic keratosis: Secondary | ICD-10-CM | POA: Diagnosis not present

## 2021-02-08 DIAGNOSIS — L820 Inflamed seborrheic keratosis: Secondary | ICD-10-CM | POA: Diagnosis not present

## 2021-02-08 DIAGNOSIS — Z20828 Contact with and (suspected) exposure to other viral communicable diseases: Secondary | ICD-10-CM | POA: Diagnosis not present

## 2021-02-08 DIAGNOSIS — L570 Actinic keratosis: Secondary | ICD-10-CM | POA: Diagnosis not present

## 2021-02-15 DIAGNOSIS — Z8616 Personal history of COVID-19: Secondary | ICD-10-CM | POA: Diagnosis not present

## 2021-02-22 DIAGNOSIS — Z8616 Personal history of COVID-19: Secondary | ICD-10-CM | POA: Diagnosis not present

## 2021-03-01 DIAGNOSIS — Z20828 Contact with and (suspected) exposure to other viral communicable diseases: Secondary | ICD-10-CM | POA: Diagnosis not present

## 2021-04-06 DIAGNOSIS — Z79899 Other long term (current) drug therapy: Secondary | ICD-10-CM | POA: Diagnosis not present

## 2021-04-06 DIAGNOSIS — M3500 Sicca syndrome, unspecified: Secondary | ICD-10-CM | POA: Diagnosis not present

## 2021-04-06 DIAGNOSIS — I340 Nonrheumatic mitral (valve) insufficiency: Secondary | ICD-10-CM | POA: Diagnosis not present

## 2021-04-06 DIAGNOSIS — N1831 Chronic kidney disease, stage 3a: Secondary | ICD-10-CM | POA: Diagnosis not present

## 2021-04-06 DIAGNOSIS — E538 Deficiency of other specified B group vitamins: Secondary | ICD-10-CM | POA: Diagnosis not present

## 2021-04-06 DIAGNOSIS — Z0000 Encounter for general adult medical examination without abnormal findings: Secondary | ICD-10-CM | POA: Diagnosis not present

## 2021-04-06 DIAGNOSIS — Z1389 Encounter for screening for other disorder: Secondary | ICD-10-CM | POA: Diagnosis not present

## 2021-04-06 DIAGNOSIS — F411 Generalized anxiety disorder: Secondary | ICD-10-CM | POA: Diagnosis not present

## 2021-04-06 DIAGNOSIS — H903 Sensorineural hearing loss, bilateral: Secondary | ICD-10-CM | POA: Diagnosis not present

## 2021-04-24 DIAGNOSIS — Z20822 Contact with and (suspected) exposure to covid-19: Secondary | ICD-10-CM | POA: Diagnosis not present

## 2021-04-24 DIAGNOSIS — Z85828 Personal history of other malignant neoplasm of skin: Secondary | ICD-10-CM | POA: Diagnosis not present

## 2021-05-31 DIAGNOSIS — H903 Sensorineural hearing loss, bilateral: Secondary | ICD-10-CM | POA: Diagnosis not present

## 2021-05-31 DIAGNOSIS — J384 Edema of larynx: Secondary | ICD-10-CM | POA: Diagnosis not present

## 2021-05-31 DIAGNOSIS — R490 Dysphonia: Secondary | ICD-10-CM | POA: Diagnosis not present

## 2021-05-31 DIAGNOSIS — Z974 Presence of external hearing-aid: Secondary | ICD-10-CM | POA: Diagnosis not present

## 2021-07-11 DIAGNOSIS — R030 Elevated blood-pressure reading, without diagnosis of hypertension: Secondary | ICD-10-CM | POA: Diagnosis not present

## 2021-07-11 DIAGNOSIS — F419 Anxiety disorder, unspecified: Secondary | ICD-10-CM | POA: Diagnosis not present

## 2021-07-11 DIAGNOSIS — N1831 Chronic kidney disease, stage 3a: Secondary | ICD-10-CM | POA: Diagnosis not present

## 2021-07-11 DIAGNOSIS — I340 Nonrheumatic mitral (valve) insufficiency: Secondary | ICD-10-CM | POA: Diagnosis not present

## 2021-07-11 DIAGNOSIS — M3500 Sicca syndrome, unspecified: Secondary | ICD-10-CM | POA: Diagnosis not present

## 2021-07-13 DIAGNOSIS — H35373 Puckering of macula, bilateral: Secondary | ICD-10-CM | POA: Diagnosis not present

## 2021-07-13 DIAGNOSIS — H04123 Dry eye syndrome of bilateral lacrimal glands: Secondary | ICD-10-CM | POA: Diagnosis not present

## 2021-08-07 DIAGNOSIS — H903 Sensorineural hearing loss, bilateral: Secondary | ICD-10-CM | POA: Diagnosis not present

## 2021-08-28 DIAGNOSIS — H903 Sensorineural hearing loss, bilateral: Secondary | ICD-10-CM | POA: Diagnosis not present

## 2021-09-07 ENCOUNTER — Ambulatory Visit
Admission: RE | Admit: 2021-09-07 | Discharge: 2021-09-07 | Disposition: A | Discharge status: Home / Self Care | Payer: Medicare HMO | Source: Ambulatory Visit | Attending: Internal Medicine | Admitting: Internal Medicine

## 2021-09-07 ENCOUNTER — Other Ambulatory Visit: Payer: Self-pay | Admitting: Internal Medicine

## 2021-09-07 DIAGNOSIS — M545 Low back pain, unspecified: Secondary | ICD-10-CM

## 2021-09-17 ENCOUNTER — Emergency Department (HOSPITAL_COMMUNITY): Payer: Medicare HMO

## 2021-09-17 ENCOUNTER — Encounter (HOSPITAL_COMMUNITY): Payer: Self-pay

## 2021-09-17 ENCOUNTER — Observation Stay (HOSPITAL_COMMUNITY)
Admission: EM | Admit: 2021-09-17 | Discharge: 2021-09-20 | Disposition: A | Discharge status: Skilled Nursing Facility | Payer: Medicare HMO | Attending: Internal Medicine | Admitting: Internal Medicine

## 2021-09-17 ENCOUNTER — Other Ambulatory Visit: Payer: Self-pay

## 2021-09-17 DIAGNOSIS — R52 Pain, unspecified: Secondary | ICD-10-CM | POA: Diagnosis present

## 2021-09-17 DIAGNOSIS — X58XXXA Exposure to other specified factors, initial encounter: Secondary | ICD-10-CM | POA: Diagnosis not present

## 2021-09-17 DIAGNOSIS — I700 Atherosclerosis of aorta: Secondary | ICD-10-CM | POA: Diagnosis not present

## 2021-09-17 DIAGNOSIS — M810 Age-related osteoporosis without current pathological fracture: Secondary | ICD-10-CM | POA: Diagnosis not present

## 2021-09-17 DIAGNOSIS — M5126 Other intervertebral disc displacement, lumbar region: Secondary | ICD-10-CM | POA: Insufficient documentation

## 2021-09-17 DIAGNOSIS — Z7982 Long term (current) use of aspirin: Secondary | ICD-10-CM | POA: Diagnosis not present

## 2021-09-17 DIAGNOSIS — E785 Hyperlipidemia, unspecified: Secondary | ICD-10-CM | POA: Insufficient documentation

## 2021-09-17 DIAGNOSIS — Z79899 Other long term (current) drug therapy: Secondary | ICD-10-CM | POA: Diagnosis not present

## 2021-09-17 DIAGNOSIS — R2681 Unsteadiness on feet: Secondary | ICD-10-CM | POA: Insufficient documentation

## 2021-09-17 DIAGNOSIS — M25551 Pain in right hip: Secondary | ICD-10-CM | POA: Diagnosis not present

## 2021-09-17 DIAGNOSIS — M48061 Spinal stenosis, lumbar region without neurogenic claudication: Secondary | ICD-10-CM | POA: Diagnosis not present

## 2021-09-17 DIAGNOSIS — E559 Vitamin D deficiency, unspecified: Secondary | ICD-10-CM | POA: Insufficient documentation

## 2021-09-17 DIAGNOSIS — M549 Dorsalgia, unspecified: Secondary | ICD-10-CM | POA: Diagnosis present

## 2021-09-17 DIAGNOSIS — M545 Low back pain, unspecified: Secondary | ICD-10-CM | POA: Insufficient documentation

## 2021-09-17 DIAGNOSIS — S32040A Wedge compression fracture of fourth lumbar vertebra, initial encounter for closed fracture: Principal | ICD-10-CM | POA: Insufficient documentation

## 2021-09-17 DIAGNOSIS — I10 Essential (primary) hypertension: Secondary | ICD-10-CM | POA: Diagnosis present

## 2021-09-17 DIAGNOSIS — M25559 Pain in unspecified hip: Secondary | ICD-10-CM | POA: Diagnosis present

## 2021-09-17 DIAGNOSIS — Z743 Need for continuous supervision: Secondary | ICD-10-CM | POA: Diagnosis not present

## 2021-09-17 HISTORY — DX: Wedge compression fracture of fourth lumbar vertebra, initial encounter for closed fracture: S32.040A

## 2021-09-17 LAB — CBC WITH DIFFERENTIAL/PLATELET
Abs Immature Granulocytes: 0.03 10*3/uL (ref 0.00–0.07)
Basophils Absolute: 0.1 10*3/uL (ref 0.0–0.1)
Basophils Relative: 1 %
Eosinophils Absolute: 0.1 10*3/uL (ref 0.0–0.5)
Eosinophils Relative: 2 %
HCT: 42.4 % (ref 36.0–46.0)
Hemoglobin: 14.6 g/dL (ref 12.0–15.0)
Immature Granulocytes: 0 %
Lymphocytes Relative: 12 %
Lymphs Abs: 1.1 10*3/uL (ref 0.7–4.0)
MCH: 34.8 pg — ABNORMAL HIGH (ref 26.0–34.0)
MCHC: 34.4 g/dL (ref 30.0–36.0)
MCV: 101.2 fL — ABNORMAL HIGH (ref 80.0–100.0)
Monocytes Absolute: 0.9 10*3/uL (ref 0.1–1.0)
Monocytes Relative: 10 %
Neutro Abs: 7.1 10*3/uL (ref 1.7–7.7)
Neutrophils Relative %: 75 %
Platelets: 238 10*3/uL (ref 150–400)
RBC: 4.19 MIL/uL (ref 3.87–5.11)
RDW: 12.8 % (ref 11.5–15.5)
WBC: 9.2 10*3/uL (ref 4.0–10.5)
nRBC: 0 % (ref 0.0–0.2)

## 2021-09-17 LAB — URINALYSIS, ROUTINE W REFLEX MICROSCOPIC
Bilirubin Urine: NEGATIVE
Glucose, UA: NEGATIVE mg/dL
Hgb urine dipstick: NEGATIVE
Ketones, ur: NEGATIVE mg/dL
Leukocytes,Ua: NEGATIVE
Nitrite: NEGATIVE
Protein, ur: NEGATIVE mg/dL
Specific Gravity, Urine: 1.009 (ref 1.005–1.030)
pH: 8 (ref 5.0–8.0)

## 2021-09-17 LAB — BASIC METABOLIC PANEL
Anion gap: 8 (ref 5–15)
BUN: 22 mg/dL (ref 8–23)
CO2: 27 mmol/L (ref 22–32)
Calcium: 9.1 mg/dL (ref 8.9–10.3)
Chloride: 102 mmol/L (ref 98–111)
Creatinine, Ser: 0.77 mg/dL (ref 0.44–1.00)
GFR, Estimated: >60 mL/min (ref >60)
Glucose, Bld: 123 mg/dL — ABNORMAL HIGH (ref 70–99)
Potassium: 4 mmol/L (ref 3.5–5.1)
Sodium: 137 mmol/L (ref 135–145)

## 2021-09-17 MED ORDER — ACETAMINOPHEN 650 MG RE SUPP
650.0000 mg | Freq: Four times a day (QID) | RECTAL | Status: DC | PRN
Start: 2021-09-17 — End: 2021-09-20

## 2021-09-17 MED ORDER — PROCHLORPERAZINE EDISYLATE 10 MG/2ML IJ SOLN
10.0000 mg | Freq: Four times a day (QID) | INTRAMUSCULAR | Status: DC | PRN
Start: 2021-09-17 — End: 2021-09-20

## 2021-09-17 MED ORDER — HYDROCODONE-ACETAMINOPHEN 5-325 MG PO TABS
1.0000 | ORAL_TABLET | Freq: Once | ORAL | Status: AC
  Administered 2021-09-17: 1 via ORAL
  Filled 2021-09-17: qty 1

## 2021-09-17 MED ORDER — FENTANYL CITRATE PF 50 MCG/ML IJ SOSY
50.0000 ug | PREFILLED_SYRINGE | Freq: Once | INTRAMUSCULAR | Status: AC
  Administered 2021-09-17: 50 ug via INTRAVENOUS
  Filled 2021-09-17: qty 1

## 2021-09-17 MED ORDER — ACETAMINOPHEN 325 MG PO TABS
650.0000 mg | ORAL_TABLET | Freq: Four times a day (QID) | ORAL | Status: DC | PRN
  Administered 2021-09-17 – 2021-09-20 (×3): 650 mg via ORAL
  Filled 2021-09-17 (×3): qty 2

## 2021-09-17 MED ORDER — HYDRALAZINE HCL 20 MG/ML IJ SOLN
10.0000 mg | Freq: Three times a day (TID) | INTRAMUSCULAR | Status: DC | PRN
  Administered 2021-09-17: 10 mg via INTRAVENOUS
  Filled 2021-09-17: qty 1

## 2021-09-17 MED ORDER — DOCUSATE SODIUM 100 MG PO CAPS
100.0000 mg | ORAL_CAPSULE | Freq: Two times a day (BID) | ORAL | Status: DC
  Administered 2021-09-17 – 2021-09-20 (×6): 100 mg via ORAL
  Filled 2021-09-17 (×6): qty 1

## 2021-09-17 MED ORDER — HYDROMORPHONE HCL 1 MG/ML IJ SOLN
0.5000 mg | INTRAMUSCULAR | Status: DC | PRN
  Administered 2021-09-18: 0.5 mg via INTRAVENOUS
  Filled 2021-09-17: qty 0.5

## 2021-09-17 NOTE — ED Provider Notes (Signed)
?[facility]-EMERGENCY DEPT ?Provider Note ? ? ?CSN: [id number] ?Arrival date & time: 09/17/21  0816 ? ?  ? ?History ? ?Chief Complaint  ?Patient presents with  ? Hip Pain  ? ? ?Lauren Davidson is a 88 y.o. female.  She is here with a complaint of low back pain radiating down her right leg its been going on for a few weeks.  She does not recall any trauma although states she did walk in a charity walk recently.  No recent falls.  She saw her primary care doctor who got an x-ray and found a compression fracture.  She has tried Tylenol and ibuprofen and now is on meloxicam without any improvement in her pain.  Pain is worse with movement.  She uses a rollator to get around.  Denies any abdominal pain urinary symptoms numbness or weakness.  No fevers or chills.  Rates the pain as severe 9 out of 10. ? ?The history is provided by the patient.  ?Back Pain ?Location:  Lumbar spine and gluteal region ?Radiates to:  R thigh ?Pain severity:  Severe ?Pain is:  Same all the time ?Onset quality:  Gradual ?Duration:  2 weeks ?Timing:  Constant ?Progression:  Unchanged ?Chronicity:  New ?Context: not recent injury   ?Relieved by:  Nothing ?Worsened by:  Ambulation ?Ineffective treatments:  Bed rest and NSAIDs ?Associated symptoms: leg pain   ?Associated symptoms: no abdominal pain, no bladder incontinence, no bowel incontinence, no chest pain, no dysuria, no fever, no numbness and no weakness   ? ?  ? ?Home Medications ?Prior to Admission medications   ?Medication Sig Start Date End Date Taking? Authorizing Provider  ?aspirin 81 MG tablet Take 81 mg by mouth daily.    [provider]  ?calcium citrate-vitamin D 200-200 MG-UNIT TABS Take 3 tablets by mouth daily.    [provider]  ?ezetimibe (ZETIA) 10 MG tablet Take 10 mg by mouth daily.    [provider]  ?fish oil-omega-3 fatty acids 1000 MG capsule Take 1,000 mg by mouth 3 (three) times daily.    [provider]   ?lidocaine (LIDODERM) 5 % Place 1 patch onto the skin daily. Remove & Discard patch within 12 hours or as directed by MD 11/03/18   Virgina Norfolk, DO  ?Multiple Vitamins-Minerals (MULTIVITAMIN WITH MINERALS) tablet Take 1 tablet by mouth daily.    [provider]  ?   ? ?Allergies    ?Buspirone, Lovastatin, Meperidine, Morphine and related, and Statins   ? ?Review of Systems   ?Review of Systems  ?Constitutional:  Negative for fever.  ?Cardiovascular:  Negative for chest pain and leg swelling.  ?Gastrointestinal:  Negative for abdominal pain and bowel incontinence.  ?Genitourinary:  Negative for bladder incontinence and dysuria.  ?Musculoskeletal:  Positive for back pain and gait problem.  ?Skin:  Negative for rash.  ?Neurological:  Negative for weakness and numbness.  ? ?Physical Exam ?Updated Vital Signs ?BP (!) 186/85 (BP Location: Left Arm)   Pulse 63   Temp 97.9 ?F (36.6 ?C) (Oral)   Resp 16   Ht 5\' 4"  (1.626 m)   Wt 63.5 kg   SpO2 95%   BMI 24.03 kg/m?  ?Physical Exam ?Vitals and nursing note reviewed.  ?Constitutional:   ?   General: She is not in acute distress. ?   Appearance: Normal appearance. She is well-developed.  ?HENT:  ?   Head: Normocephalic and atraumatic.  ?Eyes:  ?   Conjunctiva/sclera:  Conjunctivae normal.  ?Cardiovascular:  ?   Rate and Rhythm: Normal rate and regular rhythm.  ?   Heart sounds: No murmur heard. ?Pulmonary:  ?   Effort: Pulmonary effort is normal. No respiratory distress.  ?   Breath sounds: Normal breath sounds.  ?Abdominal:  ?   Palpations: Abdomen is soft.  ?   Tenderness: There is no abdominal tenderness. There is no guarding or rebound.  ?Musculoskeletal:     ?   General: Tenderness present. No swelling. Normal range of motion.  ?   Cervical back: Neck supple.  ?   Comments: She has some reproducible tenderness in her right buttock area.  She can move her right leg but it causes her discomfort.  Distal pulses intact.  ?Skin: ?   General: Skin is warm and  dry.  ?   Capillary Refill: Capillary refill takes less than 2 seconds.  ?Neurological:  ?   General: No focal deficit present.  ?   Mental Status: She is alert.  ?   Sensory: No sensory deficit.  ?   Motor: No weakness.  ? ? ?ED Results / Procedures / Treatments   ?Labs ?(all labs ordered are listed, but only abnormal results are displayed) ?Labs Reviewed  ?BASIC METABOLIC PANEL - Abnormal; Notable for the following components:  ?    Result Value  ? Glucose, Bld 123 (*)   ? All other components within normal limits  ?CBC WITH DIFFERENTIAL/PLATELET - Abnormal; Notable for the following components:  ? MCV 101.2 (*)   ? MCH 34.8 (*)   ? All other components within normal limits  ?URINALYSIS, ROUTINE W REFLEX MICROSCOPIC - Abnormal; Notable for the following components:  ? APPearance CLOUDY (*)   ? All other components within normal limits  ?COMPREHENSIVE METABOLIC PANEL  ?CBC  ?VITAMIN D 25 HYDROXY (VIT D DEFICIENCY, FRACTURES)  ? ? ?EKG ?None ? ?Radiology ?CT Lumbar Spine Wo Contrast ? ?Result Date: 09/17/2021 ?CLINICAL DATA:  Larey SeatFell. Compression fracture. EXAM: CT LUMBAR SPINE WITHOUT CONTRAST TECHNIQUE: Multidetector CT imaging of the lumbar spine was performed without intravenous contrast administration. Multiplanar CT image reconstructions were also generated. RADIATION DOSE REDUCTION: This exam was performed according to the departmental dose-optimization program which includes automated exposure control, adjustment of the mA and/or kV according to patient size and/or use of iterative reconstruction technique. COMPARISON:  Lumbar spine radiographs 09/07/2021 FINDINGS: Segmentation: There are five lumbar type vertebral bodies. The last full intervertebral disc space is labeled L5-S1. Alignment: Normal overall alignment. Vertebrae: Diffuse osteoporosis. There is a 60% compression fracture the L4 vertebral body. Associated retropulsion. The fracture does involve the pedicles bilaterally and could potentially be  unstable. The facets are maintained. No other fractures are identified. Paraspinal and other soft tissues: Paraspinal hematoma at L4. Advanced aortic calcifications but no aneurysm. Disc levels: T12-L1: No significant findings. L1-2: Mild annular bulge and mild osteophytic ridging but no significant spinal or foraminal stenosis. L2-3: Left paracentral and foraminal disc protrusion contacting and slightly displacing the left L2 nerve. There is also a diffuse bulging annulus and moderate facet disease contributing to mild spinal and bilateral lateral recess stenosis. L3-4: Mild retropulsion the L4 fracture in combination with a bulging degenerated annulus and osteophytic ridging contributing to moderate spinal and bilateral lateral recess stenosis and mild bilateral foraminal stenosis, left greater than right. L4-5: Bulging degenerated annulus, osteophytic ridging, facet disease and ligamentum flavum thickening all contributing to severe spinal and bilateral lateral recess stenosis and moderate bilateral foraminal  stenosis. L5-S1: No significant findings. IMPRESSION: 1. Acute or subacute 60% compression fracture of L4. The fracture does involve the pedicles bilaterally and could potentially be unstable. 2. 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 3. Moderate spinal and bilateral lateral recess stenosis and mild bilateral foraminal stenosis at L3-4. 4. Left paracentral and foraminal disc protrusion at L2-3 contacting and slightly displacing the left L2 nerve. Aortic Atherosclerosis (ICD10-I70.0). Electronically Signed   By: Rudie Meyer M.D.   On: 09/17/2021 10:17  ? ?DG Hip Unilat With Pelvis 2-3 Views Right ? ?Result Date: 09/17/2021 ?CLINICAL DATA:  Right hip pain without trauma. EXAM: DG HIP (WITH OR WITHOUT PELVIS) 2-3V RIGHT COMPARISON:  10/26/2019 from [facility] FINDINGS: AP view of the pelvis and AP/frog leg views of the  right hip. Femoral heads are located. Osteopenia. Joint spaces are maintained for age. No acute fracture. Lumbosacral spondylosis, suboptimally evaluated. IMPRESSION: No acute osseous abnormality. Electronically Signed   By: Jacolyn Reedy

## 2021-09-17 NOTE — H&P (Signed)
?History and Physical  ? ? ?Patient: Lauren Davidson:[id number] DOB: 1921-10-06 ?DOA: 09/17/2021 ?DOS: the patient was seen and examined on 09/17/2021 ?PCP: Patient, No Pcp Per (Inactive)  ?Patient coming from: ALF/ILF ? ?Chief Complaint:  ?Chief Complaint  ?Patient presents with  ? Hip Pain  ? ?HPI: Lauren Davidson is a 88 y.o. female with medical history significant of HLD, GAD. Presenting with back pain. She reports that she was in her normal state of health until a couple of days ago. She began to feel sharp back pain radiating down her right leg. She says that she did a charity walk last week. She didn't have any falls or obvious injury at the time. It was 3 days after that when she began to have the back pain. She tried APAP and ibuprofen, but it didn't help. This morning when her pain was unresolved, she decided to come to the ED for help. She denies any other aggravating or alleviating factors.  ? ?Review of Systems: As mentioned in the history of present illness. All other systems reviewed and are negative. ?Past Medical History:  ?Diagnosis Date  ? Hyperlipidemia   ? ?PSHx ?History reviewed. No pertinent surgical history. ? ? ?Social History:  reports that she has never smoked. She has never used smokeless tobacco. She reports that she does not drink alcohol and does not use drugs. ? ?Allergies  ?Allergen Reactions  ? Buspirone   ?  Other reaction(s): weakness  ? Lovastatin   ?  Other reaction(s): Myalgias (intolerance)  ? Meperidine   ?  Other reaction(s): Other (See Comments) ?Unknown reaction  ? Morphine And Related   ? Statins   ?  Other reaction(s): Myalgias (intolerance), Unknown  ? ? ?History reviewed. No pertinent family history. ? ?Prior to Admission medications   ?Medication Sig Start Date End Date Taking? Authorizing Provider  ?aspirin 81 MG tablet Take 81 mg by mouth daily.    [provider]  ?calcium citrate-vitamin D 200-200 MG-UNIT TABS Take 3 tablets by mouth daily.    [provider]  ?ezetimibe (ZETIA) 10 MG tablet Take 10 mg by mouth daily.    [provider]  ?fish oil-omega-3 fatty acids 1000 MG capsule Take 1,000 mg by mouth 3 (three) times daily.    [provider]  ?lidocaine (LIDODERM) 5 % Place 1 patch onto the skin daily. Remove & Discard patch within 12 hours or as directed by MD 11/03/18   Virgina Norfolk, DO  ?Multiple Vitamins-Minerals (MULTIVITAMIN WITH MINERALS) tablet Take 1 tablet by mouth daily.    [provider]  ? ? ?Physical Exam: ?Vitals:  ? 09/17/21 1600 09/17/21 1615 09/17/21 1630 09/17/21 1634  ?BP: (!) 181/89 (!) 177/75 (!) 169/80   ?Pulse: 88 82 83   ?Resp: 17 15 (!) 21   ?Temp:    97.7 ?F (36.5 ?C)  ?TempSrc:    Oral  ?SpO2: 94% 95% 95%   ?Weight:      ?Height:      ? ?General: 88 y.o. female resting in bed in NAD ?Eyes: PERRL, normal sclera ?ENMT: Nares patent w/o discharge, orophaynx clear, dentition normal, ears w/o discharge/lesions/ulcers ?Neck: Supple, trachea midline ?Cardiovascular: RRR, +S1, S2, no m/g/r, equal pulses throughout ?Respiratory: CTABL, no w/r/r, normal WOB ?GI: BS+, NDNT, no masses noted, no organomegaly noted ?MSK: No e/c/c ?Neuro: A&O x 3, no focal deficits ?Psyc: Appropriate interaction and affect, calm/cooperative ? ?Data Reviewed: ? ?Na+ 137 ?Scr  0.77 ?WBC  9.2 ?Hgb  14.6 ? ?CT Lumbar Spine w/o contrast ?1. Acute or subacute 60% compression fracture of L4. The fracture does involve the pedicles bilaterally and could potentially be unstable. ?2. 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 ?3. Moderate spinal and bilateral lateral recess stenosis and mild bilateral foraminal stenosis at L3-4. ?4. Left paracentral and foraminal disc protrusion at L2-3 contacting and slightly displacing the left L2 nerve. ? ?Assessment and Plan: ?No notes have been filed under this hospital service. ?Service: Hospitalist ? ?L4 Compression  fracture ?Multifactorial spinal and bilateral lateral recess stenosis and moderate bilateral foraminal stenosis of L4-L5 ?    - admit to obs, med-surg ?    - EDP spoke with neurosurgery; recommend bracing and PT/OT; no surgical intervention planned ?    - PT/OT/TOC ? ?Intractable pain ?    - d/t above ? ?HLD ?    - continue home regimen when confirmed ? ?HTN ?    - doesn't look like she's on any home meds ?    - likely pain component here; will have PRNs available ? ? Advance Care Planning:   Code Status: DNR ? ?Consults: EDP spoke with neurosurgery ? ?Family Communication: w/ son by phone ? ?Severity of Illness: ?The appropriate patient status for this patient is OBSERVATION. Observation status is judged to be reasonable and necessary in order to provide the required intensity of service to ensure the patient's safety. The patient's presenting symptoms, physical exam findings, and initial radiographic and laboratory data in the context of their medical condition is felt to place them at decreased risk for further clinical deterioration. Furthermore, it is anticipated that the patient will be medically stable for discharge from the hospital within 2 midnights of admission.  ? ?Author: ?Teddy Spike, DO ?09/17/2021 5:17 PM ? ?For on call review [email or web address].  ?

## 2021-09-17 NOTE — ED Triage Notes (Signed)
Pt BIB EMS from [facility] c/o right hip pain x few days denies fall or injury to area. ?158/82 ?97% ?196 bgl ?88 hr ?

## 2021-09-17 NOTE — Progress Notes (Signed)
Orthopedic Tech Progress Note ?Patient Details:  ?Farrel Gordon Tirey ?02-18-1922 ?[id number] ?Patient refused the application of her back brace from [employer]  ?Patient ID: Katrinka Blazing, female   DOB: February 07, 1922, 88 y.o.   MRN: [id number] ? ?Veleda Mun E Fern Canova ?09/17/2021, 1:12 PM ? ?

## 2021-09-17 NOTE — ED Notes (Signed)
Call received from pt daughter Taiya Nutting [phone] requesting rtn call for pt status/updates. ENMiles ?

## 2021-09-17 NOTE — Progress Notes (Signed)
Orthopedic Tech Progress Note ?Patient Details:  ?Theresia Lo Lindamood ?Jul 09, 1921 ?[id number] ?Called in order for back brace  ?Patient ID: Johny Shears, female   DOB: Feb 11, 1922, 88 y.o.   MRN: [id number] ? ?Donald Pore ?09/17/2021, 11:35 AM ? ?

## 2021-09-17 NOTE — Progress Notes (Signed)
.  Transition of Care [facility]) - Emergency Department Mini Assessment ? ? ?Patient Details  ?Name: Lauren Davidson ?MRN: [id number] ?Date of Birth: Jul 14, 1921 ? ?Transition of Care (TOC) CM/SW Contact:    ?Valentina Shaggy Jibreel Fedewa, LCSW ?Phone Number: ?09/17/2021, 2:51 PM ? ? ?Clinical Narrative: ? ?CSW TOC spoke with pt at the bedside. Pt stated she is from [employer]. She stated she was in a [place] about two weeks ago. She stated she uses a Rollator at baseline. Pt stated when she is weight-bearing she is in a lot of pain. CSW explained that a PT order has been made to recommend if the placement is needed.  ? ?CSW spoke with pt's son [name], he stated pt has never had an SNF placement. He stated he is out of state and does not know the area. CSW informed pt's son of the SNF placement process. And that someone will follow up with him after pt is seen by Physical Therapy.TOC to follow.  ? ? ?ED Mini Assessment: ?  ? ?Barriers to Discharge: SNF Pending bed offer ? ?  ? ?  ? ?Interventions which prevented an admission or readmission: SNF Placement ? ? ? ?Patient Contact and Communications ?  ?  ?  ? ,     ?  ?  ? ?  ?  ?  ? ?Admission diagnosis:  rt leg pain ?There are no problems to display for this patient. ? ?PCP:  Patient, No Pcp Per (Inactive) ?Pharmacy:  No Pharmacies Listed  ?

## 2021-09-17 NOTE — Evaluation (Signed)
Physical Therapy Evaluation ?Patient Details ?Name: Lauren Davidson ?MRN: [id number] ?DOB: [birth year] ?Today's Date: 09/17/2021 ? ?History of Present Illness ? GIRTRUDE ENSLIN is a 88 y.o. female. PMH minimal. Resides at [facility]. Comes to ED 09/17/21  with a complaint of lowback pain.CT- There is 50% decrease in height of body of L4 vertebra. There is  mild retropulsion of upper endplate of body of L1 vertebraback pain radiating down her right leg its been going on for a few weeks.  She does not recall any trauma although states she did walk in a charity walk recently.  ?Clinical Impression ? The patient  reports pain  increases with mobility, reports down to right knee. Patient required max assist with bed mobility. Did stand at Rw and took small side steps with imcreased back and right knee pain. Patient resides in independent Living     and now will require increased assistance. Pt admitted with above diagnosis.  Pt currently with functional limitations due to the deficits listed below (see PT Problem List). Pt will benefit from skilled PT to increase their independence and safety with mobility to allow discharge to the venue listed below.   ? ?   ? ?Recommendations for follow up therapy are one component of a multi-disciplinary discharge planning process, led by the attending physician.  Recommendations may be updated based on patient status, additional functional criteria and insurance authorization. ? ?Follow Up Recommendations Skilled nursing-short term rehab (<3 hours/day) ? ?  ?Assistance Recommended at Discharge Frequent or constant Supervision/Assistance  ?Patient can return home with the following ? A lot of help with walking and/or transfers;A lot of help with bathing/dressing/bathroom;Assistance with cooking/housework;Assist for transportation;Help with stairs or ramp for entrance ? ?  ?Equipment Recommendations None recommended by PT  ?Recommendations for Other Services ?    ?  ?Functional Status  Assessment Patient has had a recent decline in their functional status and demonstrates the ability to make significant improvements in function in a reasonable and predictable amount of time.  ? ?  ?Precautions / Restrictions Precautions ?Precautions: Fall  ? ?  ? ?Mobility ? Bed Mobility ?Overal bed mobility: Needs Assistance ?Bed Mobility: Rolling, Sidelying to Sit, Sit to Sidelying ?Rolling: Mod assist ?Sidelying to sit: Max assist ?  ?  ?Sit to sidelying: Max assist ?General bed mobility comments: assist to roll, assist legs and trunk ?  ? ?Transfers ?Overall transfer level: Needs assistance ?Equipment used: Rolling walker (2 wheels) ?Transfers: Sit to/from Stand ?Sit to Stand: Mod assist ?  ?  ?  ?  ?  ?General transfer comment: stood x 2, side steps  along bed x 2 ' ?  ? ?Ambulation/Gait ?  ?  ?  ?  ?  ?  ?  ?  ? ?Stairs ?  ?  ?  ?  ?  ? ?Wheelchair Mobility ?  ? ?Modified Rankin (Stroke Patients Only) ?  ? ?  ? ?Balance Overall balance assessment: Needs assistance ?Sitting-balance support: Feet supported, Bilateral upper extremity supported ?Sitting balance-Leahy Scale: Fair ?  ?  ?Standing balance support: During functional activity, Bilateral upper extremity supported, Reliant on assistive device for balance ?Standing balance-Leahy Scale: Poor ?  ?  ?  ?  ?  ?  ?  ?  ?  ?  ?  ?  ?   ? ? ? ?Pertinent Vitals/Pain Pain Assessment ?Pain Assessment: 0-10 ?Pain Score: 8  ?Pain Location: back and right knee ?Pain Descriptors / Indicators: Discomfort, Moaning,  Cramping ?Pain Intervention(s): Monitored during session, Premedicated before session, Repositioned, Limited activity within patient's tolerance  ? ? ?Home Living   ?Living Arrangements: Alone ?Available Help at Discharge: Family;Friend(s);Available PRN/intermittently;Personal care attendant ?Type of Home: Independent living facility ?Home Access: Level entry ?  ?  ?  ?Home Layout: One level ?Home Equipment: Rollator (4 wheels) ?   ?  ?Prior Function Prior  Level of Function : Independent/Modified Independent ?  ?  ?  ?  ?  ?  ?  ?  ?  ? ? ?Hand Dominance  ?   ? ?  ?Extremity/Trunk Assessment  ? Upper Extremity Assessment ?Upper Extremity Assessment: Overall WFL for tasks assessed ?  ? ?Lower Extremity Assessment ?Lower Extremity Assessment: Generalized weakness ?  ? ?Cervical / Trunk Assessment ?Cervical / Trunk Assessment: Kyphotic  ?Communication  ? Communication: No difficulties;HOH  ?Cognition Arousal/Alertness: Awake/alert ?Behavior During Therapy: [facility] for tasks assessed/performed ?Overall Cognitive Status: Within Functional Limits for tasks assessed ?  ?  ?  ?  ?  ?  ?  ?  ?  ?  ?  ?  ?  ?  ?  ?  ?  ?  ?  ? ?  ?General Comments   ? ?  ?Exercises    ? ?Assessment/Plan  ?  ?PT Assessment Patient needs continued PT services  ?PT Problem List Decreased strength;Decreased mobility;Decreased safety awareness;Decreased knowledge of precautions;Decreased activity tolerance;Decreased balance;Pain ? ?   ?  ?PT Treatment Interventions DME instruction;Therapeutic activities;Gait training;Therapeutic exercise;Patient/family education;Functional mobility training   ? ?PT Goals (Current goals can be found in the Care Plan section)  ?Acute Rehab PT Goals ?Patient Stated Goal: i want to go home but I can't ?PT Goal Formulation: With patient ?Time For Goal Achievement: 10/01/21 ?Potential to Achieve Goals: Fair ? ?  ?Frequency Min 2X/week ?  ? ? ?Co-evaluation   ?  ?  ?  ?  ? ? ?  ?AM-PAC PT "6 Clicks" Mobility  ?Outcome Measure Help needed turning from your back to your side while in a flat bed without using bedrails?: A Lot ?Help needed moving from lying on your back to sitting on the side of a flat bed without using bedrails?: A Lot ?Help needed moving to and from a bed to a chair (including a wheelchair)?: A Lot ?Help needed standing up from a chair using your arms (e.g., wheelchair or bedside chair)?: A Lot ?Help needed to walk in hospital room?: Total ?Help needed climbing  3-5 steps with a railing? : Total ?6 Click Score: 10 ? ?  ?End of Session Equipment Utilized During Treatment: Gait belt ?Activity Tolerance: Patient limited by pain ?Patient left: in bed ?Nurse Communication: Mobility status ?PT Visit Diagnosis: Unsteadiness on feet (R26.81);Difficulty in walking, not elsewhere classified (R26.2);Pain ?  ? ?Time: 3009-2330 ?PT Time Calculation (min) (ACUTE ONLY): 37 min ? ? ?Charges:   PT Evaluation ?$PT Eval Low Complexity: 1 Low ?PT Treatments ?$Therapeutic Activity: 8-22 mins ?  ?   ? ? ?Blanchard Kelch PT ?Acute Rehabilitation Services ?Pager [phone] ?Office [phone] ? ? ?Lev Cervone, Jobe Igo ?09/17/2021, 5:18 PM ?

## 2021-09-18 DIAGNOSIS — S32040A Wedge compression fracture of fourth lumbar vertebra, initial encounter for closed fracture: Secondary | ICD-10-CM | POA: Diagnosis not present

## 2021-09-18 LAB — CBC
HCT: 39.5 % (ref 36.0–46.0)
Hemoglobin: 13.5 g/dL (ref 12.0–15.0)
MCH: 34.4 pg — ABNORMAL HIGH (ref 26.0–34.0)
MCHC: 34.2 g/dL (ref 30.0–36.0)
MCV: 100.8 fL — ABNORMAL HIGH (ref 80.0–100.0)
Platelets: 268 10*3/uL (ref 150–400)
RBC: 3.92 MIL/uL (ref 3.87–5.11)
RDW: 12.6 % (ref 11.5–15.5)
WBC: 8.3 10*3/uL (ref 4.0–10.5)
nRBC: 0 % (ref 0.0–0.2)

## 2021-09-18 LAB — COMPREHENSIVE METABOLIC PANEL
ALT: 39 U/L (ref 0–44)
AST: 48 U/L — ABNORMAL HIGH (ref 15–41)
Albumin: 3.3 g/dL — ABNORMAL LOW (ref 3.5–5.0)
Alkaline Phosphatase: 86 U/L (ref 38–126)
Anion gap: 7 (ref 5–15)
BUN: 19 mg/dL (ref 8–23)
CO2: 25 mmol/L (ref 22–32)
Calcium: 8.9 mg/dL (ref 8.9–10.3)
Chloride: 103 mmol/L (ref 98–111)
Creatinine, Ser: 0.79 mg/dL (ref 0.44–1.00)
GFR, Estimated: >60 mL/min (ref >60)
Glucose, Bld: 99 mg/dL (ref 70–99)
Potassium: 4 mmol/L (ref 3.5–5.1)
Sodium: 135 mmol/L (ref 135–145)
Total Bilirubin: 0.9 mg/dL (ref 0.3–1.2)
Total Protein: 6.7 g/dL (ref 6.5–8.1)

## 2021-09-18 LAB — VITAMIN D 25 HYDROXY (VIT D DEFICIENCY, FRACTURES): Vit D, 25-Hydroxy: 83.53 ng/mL (ref 30–100)

## 2021-09-18 MED ORDER — PROSIGHT PO TABS
1.0000 | ORAL_TABLET | Freq: Every day | ORAL | Status: DC
  Administered 2021-09-18 – 2021-09-19 (×2): 1 via ORAL
  Filled 2021-09-18 (×3): qty 1

## 2021-09-18 MED ORDER — POLYETHYL GLYC-PROPYL GLYC PF 0.4-0.3 % OP SOLN: 1.0000 [drp] | Freq: Four times a day (QID) | OPHTHALMIC | Status: DC

## 2021-09-18 MED ORDER — OYSTER SHELL CALCIUM/D3 500-5 MG-MCG PO TABS
1.0000 | ORAL_TABLET | Freq: Every day | ORAL | Status: DC
  Administered 2021-09-19 – 2021-09-20 (×2): 1 via ORAL
  Filled 2021-09-18 (×2): qty 1

## 2021-09-18 MED ORDER — HYPROMELLOSE 0.3 % OP GEL: 1.0000 "application " | Freq: Every day | OPHTHALMIC | Status: DC

## 2021-09-18 MED ORDER — LIDOCAINE 5 % EX PTCH
1.0000 | MEDICATED_PATCH | CUTANEOUS | Status: DC
  Administered 2021-09-19 – 2021-09-20 (×2): 1 via TRANSDERMAL
  Filled 2021-09-18 (×3): qty 1

## 2021-09-18 MED ORDER — POLYVINYL ALCOHOL 1.4 % OP SOLN
1.0000 [drp] | Freq: Four times a day (QID) | OPHTHALMIC | Status: DC
  Administered 2021-09-18 – 2021-09-20 (×7): 1 [drp] via OPHTHALMIC
  Filled 2021-09-18: qty 15

## 2021-09-18 MED ORDER — ARTIFICIAL TEARS OPHTHALMIC OINT
TOPICAL_OINTMENT | Freq: Every day | OPHTHALMIC | Status: DC
Start: 2021-09-18 — End: 2021-09-20
  Filled 2021-09-18: qty 3.5

## 2021-09-18 MED ORDER — ADULT MULTIVITAMIN W/MINERALS CH
1.0000 | ORAL_TABLET | Freq: Every day | ORAL | Status: DC
  Administered 2021-09-19 – 2021-09-20 (×2): 1 via ORAL
  Filled 2021-09-18 (×2): qty 1

## 2021-09-18 MED ORDER — MELOXICAM 15 MG PO TABS
15.0000 mg | ORAL_TABLET | Freq: Every day | ORAL | Status: DC
  Administered 2021-09-18 – 2021-09-20 (×3): 15 mg via ORAL
  Filled 2021-09-18 (×3): qty 1

## 2021-09-18 NOTE — Progress Notes (Signed)
?Progress Note ? ? ?Patient: Lauren Davidson [id number] DOB: 08-Oct-1921 DOA: 09/17/2021     0 ?DOS: the patient was seen and examined on 09/18/2021 ?  ?Brief hospital course: ?Taken from H&P. ? ? Lauren Davidson is a 88 y.o. female with medical history significant of HLD, GAD. Presenting with back pain. She reports that she was in her normal state of health until a couple of days ago. She began to feel sharp back pain radiating down her right leg. She says that she did a charity walk last week. She didn't have any falls or obvious injury at the time. It was 3 days after that when she began to have the back pain. She tried APAP and ibuprofen, but it didn't help. This morning when her pain was unresolved, she decided to come to the ED for help. She denies any other aggravating or alleviating factors.  ? ?CT Lumbar Spine w/o contrast ?1. Acute or subacute 60% compression fracture of L4. The fracture does involve the pedicles bilaterally and could potentially be unstable. ?2. 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 ?3. Moderate spinal and bilateral lateral recess stenosis and mild bilateral foraminal stenosis at L3-4. ?4. Left paracentral and foraminal disc protrusion at L2-3 contacting and slightly displacing the left L2 nerve. ? ?09/19/21: Patient was still having pain with ambulation, no pain while lying down.  No other complaints.  Neurosurgery was consulted from ED and they are recommending back brace and conservative management.  No surgical intervention planned. ?PT and OT are recommending rehab-TOC started working on it. ?Patient lives alone in an independent living facility, at baseline she was independent for ADLs. ? ? ?Assessment and Plan: ?* Closed compression fracture of L4 lumbar vertebra, initial encounter ([place]) ?Multifactorial spinal and bilateral lateral recess stenosis and moderate bilateral foraminal stenosis of L4-L5. ?EDP spoke  with neurosurgery; recommend bracing and PT/OT; no surgical intervention planned. ?PT/OT are recommending rehab. ?-TOC consult for work on placement. ?-Continue with pain management and conservative measures ? ?Back pain ?Secondary to above. ?-Continue with pain management ? ?HTN (hypertension) ?Blood pressure within normal limit.  Patient was not on any antihypertensives at home. ?-Continue to monitor ? ? ?HLD (hyperlipidemia) ?Not on any medication at home.  Just taking fish oil. ? ? ?Subjective: Patient was lying comfortably when seen today.  Denies any pain while lying down, stating that pain only occurs with ambulation.  She is a very pleasant but hard of hearing lady, keep apologizing that she does not has her hearing aid.  At baseline she lives independently at an independent living facility and perform all ADLs without any difficulty. ? ?Physical Exam: ?Vitals:  ? 09/18/21 0400 09/18/21 0603 09/18/21 0918 09/18/21 1322  ?BP: (!) 118/57 140/66 (!) 167/73 (!) 165/64  ?Pulse: 71 83 78 75  ?Resp: 18 16 (!) 22 18  ?Temp: 97.7 ?F (36.5 ?C) 97.6 ?F (36.4 ?C) 98 ?F (36.7 ?C) (!) 97.4 ?F (36.3 ?C)  ?TempSrc: Oral  Oral Oral  ?SpO2: 94% 96% 96% 97%  ?Weight:      ?Height:      ? ?General.  Frail elderly lady, in no acute distress. ?Pulmonary.  Lungs clear bilaterally, normal respiratory effort. ?CV.  Regular rate and rhythm, no JVD, rub or murmur. ?Abdomen.  Soft, nontender, nondistended, BS positive. ?CNS.  Alert and oriented .  No focal neurologic deficit. ?Extremities.  No edema, no cyanosis, pulses intact and symmetrical. ?Psychiatry.  Judgment  and insight appears normal. ? ?Data Reviewed: ?Prior notes, labs and images reviewed ? ?Family Communication: Discussed with son on phone. ? ?Disposition: ?Status is: Observation ?The patient remains OBS appropriate and will d/c before 2 midnights. ? Planned Discharge Destination: Skilled nursing facility ? ?Time spent: 45  Minutes ? ?This record has been created using  Systems analyst. Errors have been sought and corrected,but may not always be located. Such creation errors do not reflect on the standard of care. ? ?Author: ?Lorella Nimrod, MD ?09/18/2021 1:45 PM ? ?For on call review [email or web address].  ?

## 2021-09-18 NOTE — Assessment & Plan Note (Signed)
Not on any medication at home.  Just taking fish oil. ?

## 2021-09-18 NOTE — Hospital Course (Signed)
Taken from H&P. ? ? Lauren Davidson is a 88 y.o. female with medical history significant of HLD, GAD. Presenting with back pain. She reports that she was in her normal state of health until a couple of days ago. She began to feel sharp back pain radiating down her right leg. She says that she did a charity walk last week. She didn't have any falls or obvious injury at the time. It was 3 days after that when she began to have the back pain. She tried APAP and ibuprofen, but it didn't help. This morning when her pain was unresolved, she decided to come to the ED for help. She denies any other aggravating or alleviating factors.  ? ?CT Lumbar Spine w/o contrast ?1. Acute or subacute 60% compression fracture of L4. The fracture does involve the pedicles bilaterally and could potentially be unstable. ?2. 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 ?3. Moderate spinal and bilateral lateral recess stenosis and mild bilateral foraminal stenosis at L3-4. ?4. Left paracentral and foraminal disc protrusion at L2-3 contacting and slightly displacing the left L2 nerve. ? ?09/19/21: Patient was still having pain with ambulation, no pain while lying down.  No other complaints.  Neurosurgery was consulted from ED and they are recommending back brace and conservative management.  No surgical intervention planned. ?PT and OT are recommending rehab-TOC started working on it. ?Patient lives alone in an independent living facility, at baseline she was independent for ADLs. ?

## 2021-09-18 NOTE — Evaluation (Signed)
Occupational Therapy Evaluation ?Patient Details ?Name: Lauren Davidson ?MRN: [id number] ?DOB: 1921-12-22 ?Today's Date: 09/18/2021 ? ? ?History of Present Illness Lauren Davidson is a 88 y.o. female. PMH minimal. [facility] Comes to ED 09/17/21  with a complaint of lowback pain.CT- There is 50% decrease in height of body of L4 vertebra. There is  mild retropulsion of upper endplate of body of L1 vertebraback pain radiating down her right leg its been going on for a few weeks.  She does not recall any trauma although states she did walk in a charity walk recently.  ? ?Clinical Impression ?  ?Patient resides at independent living facility and is typically mod I for self care and functional mobility. Currently patient needing increased assistance with self care and transfers due to pain, limited activity tolerance, safety awareness and precautions.Patient needing min A and verbal cues to log roll out of bed and lumbar corset donned in sitting. Overall min A +2 for safety to transfer to recliner chair needing cues to push from bed and not to push walker too far forward while taking steps. Patient total A to don socks due to back precautions/pain. If patient able to increase caregiver support at ILF close to 24/7 could D/C with [facility], if unable then recommend SNF. Acute OT to follow. ?   ? ?Recommendations for follow up therapy are one component of a multi-disciplinary discharge planning process, led by the attending physician.  Recommendations may be updated based on patient status, additional functional criteria and insurance authorization.  ? ?Follow Up Recommendations ? Skilled nursing-short term rehab (<3 hours/day) (unless PCA can increase hours ~24/7)  ?  ?Assistance Recommended at Discharge Frequent or constant Supervision/Assistance  ?Patient can return home with the following A little help with walking and/or transfers;A lot of help with bathing/dressing/bathroom;Assistance with  cooking/housework;Assist for transportation;Help with stairs or ramp for entrance ? ?  ?Functional Status Assessment ? Patient has had a recent decline in their functional status and demonstrates the ability to make significant improvements in function in a reasonable and predictable amount of time.  ?Equipment Recommendations ? None recommended by OT  ?  ?   ?Precautions / Restrictions Precautions ?Precautions: Fall ?Required Braces or Orthoses: Spinal Brace ?Spinal Brace: Lumbar corset;Applied in sitting position ?Restrictions ?Weight Bearing Restrictions: No  ? ?  ? ?Mobility Bed Mobility ?Overal bed mobility: Needs Assistance ?Bed Mobility: Rolling, Sidelying to Sit ?Rolling: Min assist ?Sidelying to sit: Min assist, HOB elevated ?  ?  ?  ?General bed mobility comments: Verbal cues for log rolling, min A to bring legs to edge of bed then upright trunk. Use of bed rail and HOB elevated ?  ? ? ? ?  ?Balance Overall balance assessment: Needs assistance ?Sitting-balance support: Feet supported ?Sitting balance-Leahy Scale: Fair ?  ?  ?Standing balance support: During functional activity, Bilateral upper extremity supported, Reliant on assistive device for balance ?Standing balance-Leahy Scale: Poor ?  ?  ?  ?  ?  ?  ?  ?  ?  ?  ?  ?  ?   ? ?ADL either performed or assessed with clinical judgement  ? ?ADL Overall ADL's : Needs assistance/impaired ?Eating/Feeding: Independent;Sitting ?  ?Grooming: Set up;Sitting ?  ?Upper Body Bathing: Minimal assistance;Sitting ?  ?Lower Body Bathing: Maximal assistance;Sit to/from stand;Sitting/lateral leans ?  ?Upper Body Dressing : Minimal assistance;Sitting ?  ?Lower Body Dressing: Total assistance;Sitting/lateral leans;Bed level ?Lower Body Dressing Details (indicate cue type and reason): To  don socks 2* back pain and acute compression fracture ?Toilet Transfer: Minimal assistance;+2 for safety/equipment;Cueing for safety;Cueing for sequencing;Ambulation;Rolling walker (2  wheels) ?Toilet Transfer Details (indicate cue type and reason): Patient needs increased time, min A for steadying and verbal cues not to push walker too far forward while taking steps to recliner. ?Toileting- Clothing Manipulation and Hygiene: Moderate assistance;Sitting/lateral lean;Sit to/from stand ?  ?  ?  ?Functional mobility during ADLs: Minimal assistance;+2 for safety/equipment;Cueing for sequencing;Cueing for safety;Rolling walker (2 wheels) ?General ADL Comments: Patient requiring increased assistance for self care tasks due to pain, decreased activity tolerance, balance, safety  ? ? ? ? ?Pertinent Vitals/Pain Pain Assessment ?Pain Assessment: 0-10 ?Pain Score: 5  ?Pain Location: Back ?Pain Descriptors / Indicators: Discomfort ?Pain Intervention(s): Premedicated before session  ? ? ? ?Hand Dominance Right ?  ?Extremity/Trunk Assessment Upper Extremity Assessment ?Upper Extremity Assessment: Generalized weakness ?  ?Lower Extremity Assessment ?Lower Extremity Assessment: Defer to PT evaluation ?  ?Cervical / Trunk Assessment ?Cervical / Trunk Assessment: Kyphotic ?  ?Communication Communication ?Communication: No difficulties;HOH ?  ?Cognition Arousal/Alertness: Awake/alert ?Behavior During Therapy: [facility] for tasks assessed/performed ?Overall Cognitive Status: Within Functional Limits for tasks assessed ?  ?  ?  ?  ?  ?  ?  ?  ?  ?  ?  ?  ?  ?  ?  ?  ?General Comments: Patient is very pleasant and cooperative. Retired Charity fundraiser ?  ?  ?   ?   ?   ? ? ?Home Living   ?Living Arrangements: Alone ?Available Help at Discharge: Family;Friend(s);Available PRN/intermittently;Personal care attendant ?Type of Home: Independent living facility ?Home Access: Level entry ?  ?  ?Home Layout: One level ?  ?  ?Bathroom Shower/Tub: Walk-in shower ?  ?  ?  ?  ?Home Equipment: Rollator (4 wheels) ?  ?  ?  ? ?  ?Prior Functioning/Environment Prior Level of Function : Independent/Modified Independent ?  ?  ?  ?  ?  ?  ?  ?  ?  ? ?  ?   ?OT Problem List: Decreased activity tolerance;Impaired balance (sitting and/or standing);Decreased safety awareness;Pain;Decreased knowledge of use of DME or AE;Decreased knowledge of precautions ?  ?   ?OT Treatment/Interventions: Self-care/ADL training;DME and/or AE instruction;Energy conservation;Therapeutic activities;Patient/family education;Balance training  ?  ?OT Goals(Current goals can be found in the care plan section) Acute Rehab OT Goals ?Patient Stated Goal: Sit up to chair ~30 mins ?OT Goal Formulation: With patient ?Time For Goal Achievement: 10/02/21 ?Potential to Achieve Goals: Good  ?OT Frequency: Min 2X/week ?  ? ?   ?AM-PAC OT "6 Clicks" Daily Activity     ?Outcome Measure Help from another person eating meals?: None ?Help from another person taking care of personal grooming?: A Little ?Help from another person toileting, which includes using toliet, bedpan, or urinal?: A Lot ?Help from another person bathing (including washing, rinsing, drying)?: A Lot ?Help from another person to put on and taking off regular upper body clothing?: A Little ?Help from another person to put on and taking off regular lower body clothing?: Total ?6 Click Score: 15 ?  ?End of Session Equipment Utilized During Treatment: Rolling walker (2 wheels);Back brace ?Nurse Communication: Mobility status;Other (comment) (doff lumbar corset once in bed) ? ?Activity Tolerance: Patient limited by fatigue ?Patient left: in chair;with call bell/phone within reach ? ?OT Visit Diagnosis: Unsteadiness on feet (R26.81);Other abnormalities of gait and mobility (R26.89)  ?              ?  Time: [id number] ?OT Time Calculation (min): 25 min ?Charges:  OT General Charges ?$OT Visit: 1 Visit ?OT Evaluation ?$OT Eval Low Complexity: 1 Low ?OT Treatments ?$Self Care/Home Management : 8-22 mins ? ?Marlyce HugeEmily Dade Rodin OT ?OT pager: [phone] ? ?Carmelia RollerEmily K Aubree Doody ?09/18/2021, 12:52 PM ?

## 2021-09-18 NOTE — Assessment & Plan Note (Signed)
Multifactorial spinal and bilateral lateral recess stenosis and moderate bilateral foraminal stenosis of L4-L5. ?EDP spoke with neurosurgery; recommend bracing and PT/OT; no surgical intervention planned. ?PT/OT are recommending rehab. ?-TOC consult for work on placement. ?-Continue with pain management and conservative measures ?

## 2021-09-18 NOTE — TOC Progression Note (Addendum)
Transition of Care (TOC) - Progression Note  ? ? ?Patient Details  ?Name: Lauren Davidson ?MRN: [id number] ?Date of Birth: 1921-10-24 ? ?Transition of Care (TOC) CM/SW Contact  ?Kaloni Bisaillon, LCSW ?Phone Number: ?09/18/2021, 1:38 PM ? ?Clinical Narrative:    ?Met with pt and spoke with son, Gershon Mussel, to further discuss plan for SNF.  Pt would prefer to return to IL at [facility], however, aware that therapies continue to recommend SNF rehab to attempt to get her as close to baseline functioning as possible (mod ind with rw).  With pt's permission, I have spoken with Carollee Herter at [facility] who confirms that pt is in Ind Living and would agree with therapy recs as they do not provide 24/7 assistance in [place].  Will begin bed search.  Will monitor progress with therapies while here in case pt makes needed gains to reconsider direct return to IL apt.  ? ?Addendum:  ?SNF bed offered and accepted with [facility].  Pt and son-in-law aware that facility to start authorization and alert [place] when this has been completed and can admit. ?  ?Barriers to Discharge: SNF Pending bed offer ? ?Expected Discharge Plan and Services ?  ?  ?  ?  ?  ?                ?  ?  ?  ?  ?  ?  ?  ?  ?  ?  ? ? ?Social Determinants of Health (SDOH) Interventions ?  ? ?Readmission Risk Interventions ?   ? View : No data to display.  ?  ?  ?  ? ? ?

## 2021-09-18 NOTE — Assessment & Plan Note (Signed)
Secondary to above. ?-Continue with pain management ?

## 2021-09-18 NOTE — Assessment & Plan Note (Signed)
Blood pressure within normal limit.  Patient was not on any antihypertensives at home. ?-Continue to monitor ? ?

## 2021-09-18 NOTE — NC FL2 (Signed)
?  [place] MEDICAID FL2 LEVEL OF CARE SCREENING TOOL  ?  ? ?IDENTIFICATION  ?Patient Name: ?Lauren Davidson Birthdate: 1921-11-30 Sex: female Admission Date (Current Location): ?09/17/2021  ?[place] and [place] Number: ? [place] ?  Facility and Address:  ?[facility],  [place], [place] ?     Provider Number: ?[id number]  ?Attending Physician Name and Address:  ?Lorella Nimrod, MD ? Relative Name and Phone Number:  ?son, Buffi Witkin [phone] ?   ?Current Level of Care: ?Hospital Recommended Level of Care: ?[place] Prior Approval Number: ?  ? ?Date Approved/Denied: ?  PASRR Number: ?[id number] ? ?Discharge Plan: ?SNF ?  ? ?Current Diagnoses: ?Patient Active Problem List  ? Diagnosis Date Noted  ? Back pain 09/17/2021  ? Closed compression fracture of L4 lumbar vertebra, initial encounter ([place]) 09/17/2021  ? Lumbar spinal stenosis 09/17/2021  ? HTN (hypertension) 09/17/2021  ? HLD (hyperlipidemia) 09/17/2021  ? ? ?Orientation RESPIRATION BLADDER Height & Weight   ?  ?Self, Time, Situation, Place ? Normal Continent Weight: 139 lb 15.9 oz (63.5 kg) ?Height:  5\' 4"  (162.6 cm)  ?BEHAVIORAL SYMPTOMS/MOOD NEUROLOGICAL BOWEL NUTRITION STATUS  ?    Continent    ?AMBULATORY STATUS COMMUNICATION OF NEEDS Skin   ?Extensive Assist Verbally Normal ?  ?  ?  ?    ?     ?     ? ? ?Personal Care Assistance Level of Assistance  ?Bathing, Dressing Bathing Assistance: Limited assistance ?  ?Dressing Assistance: Limited assistance ?   ? ?Functional Limitations Info  ?    ?  ?   ? ? ?SPECIAL CARE FACTORS FREQUENCY  ?PT (By licensed PT), OT (By licensed OT)   ?  ?PT Frequency: 5x/wk ?OT Frequency: 5x/wk ?  ?  ?  ?   ? ? ?Contractures Contractures Info: Not present  ? ? ?Additional Factors Info  ?Code Status, Allergies Code Status Info: DNR ?Allergies Info: Statins, Buprenorphine Hcl, Buspirone, Lexapro (Escitalopram), Lovastatin, Meperidine, Morphine And Related ?  ?  ?  ?   ? ?Current  Medications (09/18/2021):  This is the current hospital active medication list ?Current Facility-Administered Medications  ?Medication Dose Route Frequency Provider Last Rate Last Admin  ? acetaminophen (TYLENOL) tablet 650 mg  650 mg Oral Q6H PRN Marylyn Ishihara, Tyrone A, DO   650 mg at 09/17/21 2259  ? Or  ? acetaminophen (TYLENOL) suppository 650 mg  650 mg Rectal Q6H PRN Marylyn Ishihara, Tyrone A, DO      ? docusate sodium (COLACE) capsule 100 mg  100 mg Oral BID Marylyn Ishihara, Tyrone A, DO   100 mg at 09/18/21 [id number]  ? hydrALAZINE (APRESOLINE) injection 10 mg  10 mg Intravenous Q8H PRN Marylyn Ishihara, Tyrone A, DO   10 mg at 09/17/21 2252  ? HYDROmorphone (DILAUDID) injection 0.5 mg  0.5 mg Intravenous Q4H PRN Marylyn Ishihara, Tyrone A, DO   0.5 mg at 09/18/21 1046  ? prochlorperazine (COMPAZINE) injection 10 mg  10 mg Intravenous Q6H PRN Marylyn Ishihara, Tyrone A, DO      ? ? ? ?Discharge Medications: ?Please see discharge summary for a list of discharge medications. ? ?Relevant Imaging Results: ? ?Relevant Lab Results: ? ? ?Additional Information ?SS# [id number]; corset brace when up ? ?Arthea Nobel, LCSW ? ? ? ? ?

## 2021-09-19 DIAGNOSIS — S32040A Wedge compression fracture of fourth lumbar vertebra, initial encounter for closed fracture: Secondary | ICD-10-CM | POA: Diagnosis not present

## 2021-09-19 MED ORDER — OXYCODONE HCL 5 MG PO TABS
5.0000 mg | ORAL_TABLET | ORAL | Status: DC | PRN
  Administered 2021-09-19: 5 mg via ORAL
  Filled 2021-09-19: qty 1

## 2021-09-19 MED ORDER — HYDROMORPHONE HCL 1 MG/ML IJ SOLN: 0.5000 mg | INTRAMUSCULAR | Status: DC | PRN

## 2021-09-19 NOTE — Progress Notes (Signed)
?Progress Note ? ? ?Patient: Lauren Davidson PTW:[id number] DOB: 1921/11/12 DOA: 09/17/2021     0 ?DOS: the patient was seen and examined on 09/19/2021 ?  ?Brief hospital course: ?Taken from H&P. ? ? Lauren Davidson is a 88 y.o. female with medical history significant of HLD, GAD. Presenting with back pain. She reports that she was in her normal state of health until a couple of days ago. She began to feel sharp back pain radiating down her right leg. She says that she did a charity walk last week. She didn't have any falls or obvious injury at the time. It was 3 days after that when she began to have the back pain. She tried APAP and ibuprofen, but it didn't help. This morning when her pain was unresolved, she decided to come to the ED for help. She denies any other aggravating or alleviating factors.  ? ?CT Lumbar Spine w/o contrast ?1. Acute or subacute 60% compression fracture of L4. The fracture does involve the pedicles bilaterally and could potentially be unstable. ?2. 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 ?3. Moderate spinal and bilateral lateral recess stenosis and mild bilateral foraminal stenosis at L3-4. ?4. Left paracentral and foraminal disc protrusion at L2-3 contacting and slightly displacing the left L2 nerve. ? ?09/19/21: Patient was still having pain with ambulation, no pain while lying down.  No other complaints.  Neurosurgery was consulted from ED and they are recommending back brace and conservative management.  No surgical intervention planned. ?PT and OT are recommending rehab-TOC started working on it. ?Patient lives alone in an independent living facility, at baseline she was independent for ADLs. ? ?Assessment and Plan: ?* Closed compression fracture of L4 lumbar vertebra, initial encounter ([facility]) ?Multifactorial spinal and bilateral lateral recess stenosis and moderate bilateral foraminal stenosis of L4-L5. ?EDP spoke  with neurosurgery; recommend bracing and PT/OT; no surgical intervention planned. ?PT/OT are recommending SNF, pending. TOC following ?-Continue with pain management and conservative measures ? ?Back pain ?Secondary to above. ?-Continue with analgesia as needed ? ?HTN (hypertension) ?Blood pressure within normal limit.  Patient was not on any antihypertensives at home. ?-Continue to monitor ? ? ?HLD (hyperlipidemia) ?Not on any medication at home.  Just taking fish oil. ? ? ?  ? ?Subjective: Reports pain when working with PT ? ?Physical Exam: ?Vitals:  ? 09/18/21 1322 09/18/21 2131 09/19/21 0457 09/19/21 1324  ?BP: (!) 165/64 (!) 156/72 (!) 147/90 (!) 125/49  ?Pulse: 75 80 72 77  ?Resp: 18 (!) 22 18 15   ?Temp: (!) 97.4 ?F (36.3 ?C) 97.7 ?F (36.5 ?C) 97.7 ?F (36.5 ?C) 97.7 ?F (36.5 ?C)  ?TempSrc: Oral Oral Oral Oral  ?SpO2: 97% 100% 96% 97%  ?Weight:      ?Height:      ? ?General exam: Awake, laying in bed, in nad ?Respiratory system: Normal respiratory effort, no wheezing ?Cardiovascular system: regular rate, s1, s2 ?Gastrointestinal system: Soft, nondistended, positive BS ?Central nervous system: CN2-12 grossly intact, strength intact ?Extremities: Perfused, no clubbing ?Skin: Normal skin turgor, no notable skin lesions seen ?Psychiatry: Mood normal // no visual hallucinations  ? ?Data Reviewed: ? ?There are no new results to review at this time. ? ?Family Communication: Pt in room, family at bedside ? ?Disposition: ?Status is: Observation ?The patient remains OBS appropriate and will d/c before 2 midnights. ? Planned Discharge Destination: Skilled nursing facility ? ? ? ? ?Author: ? , MD ?09/19/2021 4:28  PM ? ?For on call review [email or web address].  ?

## 2021-09-19 NOTE — Assessment & Plan Note (Signed)
Multifactorial spinal and bilateral lateral recess stenosis and moderate bilateral foraminal stenosis of L4-L5. ?EDP spoke with neurosurgery; recommend bracing and PT/OT; no surgical intervention planned. ?PT/OT are recommending SNF, pending. TOC following ?-Continue with pain management and conservative measures ?

## 2021-09-19 NOTE — Progress Notes (Signed)
Physical Therapy Treatment ?Patient Details ?Name: Lauren Davidson ?MRN: [id number] ?DOB: [birth year] ?Today's Date: 09/19/2021 ? ? ?History of Present Illness Lauren Davidson is a 88 y.o. female. PMH minimal. Resides at [facility]. Comes to ED 09/17/21  with a complaint of lowback pain.CT- There is 50% decrease in height of body of L4 vertebra. There is  mild retropulsion of upper endplate of body of L1 vertebraback pain radiating down her right leg its been going on for a few weeks.  She does not recall any trauma although states she did walk in a charity walk recently. ? ?  ?PT Comments  ? ? Significant functional improvement today. Pt able to transition from supine to edge of bed with supervision. Once sitting, pt required full assist to donn Lumbar brace with vc's to explain proper application.  Pt able to stand from bed with MinA and complete gait training with RW 64ft with Min Guard. Seated B LE strengthening exercises completed and pt educated on continuing to Log roll and prevent twisting at lower back.  Pt remains very motivated to improve and will benefit from short term stay at SNF once medically stable. ?  ?Recommendations for follow up therapy are one component of a multi-disciplinary discharge planning process, led by the attending physician.  Recommendations may be updated based on patient status, additional functional criteria and insurance authorization. ? ?Follow Up Recommendations ? Skilled nursing-short term rehab (<3 hours/day) ?  ?  ?Assistance Recommended at Discharge Frequent or constant Supervision/Assistance  ?Patient can return home with the following A little help with walking and/or transfers;A little help with bathing/dressing/bathroom;Assist for transportation ?  ?Equipment Recommendations ? None recommended by PT  ?  ?Recommendations for Other Services   ? ? ?  ?Precautions / Restrictions Precautions ?Precautions: Fall ?Required Braces or Orthoses: Spinal Brace ?Spinal Brace: Lumbar  corset;Applied in sitting position ?Restrictions ?Weight Bearing Restrictions: No  ?  ? ?Mobility ? Bed Mobility ?Overal bed mobility: Needs Assistance ?Bed Mobility: Rolling, Sidelying to Sit ?Rolling: Min assist ?Sidelying to sit: Supervision, HOB elevated ?  ?  ?  ?General bed mobility comments: Verbal cues for log rolling, min A to bring legs to edge of bed then upright trunk. Use of bed rail and HOB elevated ?  ? ?Transfers ?Overall transfer level: Needs assistance ?Equipment used: Rolling walker (2 wheels) ?Transfers: Sit to/from Stand ?Sit to Stand: Min assist ?  ?  ?  ?  ?  ?  ?  ? ?Ambulation/Gait ?Ambulation/Gait assistance: Min guard ?Gait Distance (Feet): 40 Feet ?Assistive device: Rolling walker (2 wheels) ?Gait Pattern/deviations: Step-to pattern, Decreased stride length, Trunk flexed ?  ?  ?  ?  ? ? ?Stairs ?  ?  ?  ?  ?  ? ? ?Wheelchair Mobility ?  ? ?Modified Rankin (Stroke Patients Only) ?  ? ? ?  ?Balance   ?  ?  ?  ?  ?  ?  ?  ?  ?  ?  ?  ?  ?  ?  ?  ?  ?  ?  ?  ? ?  ?Cognition Arousal/Alertness: Awake/alert ?Behavior During Therapy: [facility] for tasks assessed/performed ?Overall Cognitive Status: Within Functional Limits for tasks assessed ?  ?  ?  ?  ?  ?  ?  ?  ?  ?  ?  ?  ?  ?  ?  ?  ?General Comments: Patient is very pleasant and cooperative. Retired Charity fundraiser ?  ?  ? ?  ?  Exercises General Exercises - Lower Extremity ?Ankle Circles/Pumps: AROM, Both, 10 reps ?Quad Sets: AROM, Both, 5 reps ?Gluteal Sets: AROM, Both, 5 reps ?Long Arc Quad: AROM, Both, 10 reps ? ?  ?General Comments   ?  ?  ? ?Pertinent Vitals/Pain Pain Assessment ?Pain Assessment: 0-10 ?Pain Score: 5  ?Pain Location: Lumbar region radiating down R LE ?Pain Descriptors / Indicators: Discomfort, Aching ?Pain Intervention(s): Monitored during session, Patient requesting pain meds-RN notified, Relaxation, Repositioned  ? ? ?Home Living   ?  ?  ?  ?  ?  ?  ?  ?  ?  ?   ?  ?Prior Function    ?  ?  ?   ? ?PT Goals (current goals can now be found  in the care plan section) Acute Rehab PT Goals ?Patient Stated Goal: i want to go home but I can't ?Progress towards PT goals: Progressing toward goals ? ?  ?Frequency ? ? ? Min 2X/week ? ? ? ?  ?PT Plan Current plan remains appropriate  ? ? ?Co-evaluation   ?  ?  ?  ?  ? ?  ?AM-PAC PT "6 Clicks" Mobility   ?Outcome Measure ? Help needed turning from your back to your side while in a flat bed without using bedrails?: A Little ?Help needed moving from lying on your back to sitting on the side of a flat bed without using bedrails?: A Little ?Help needed moving to and from a bed to a chair (including a wheelchair)?: A Little ?Help needed standing up from a chair using your arms (e.g., wheelchair or bedside chair)?: A Lot ?  ?Help needed climbing 3-5 steps with a railing? : A Lot ?6 Click Score: 13 ? ?  ?End of Session Equipment Utilized During Treatment: Gait belt ?Activity Tolerance: Patient limited by fatigue;Patient limited by pain ?Patient left: in chair;with call bell/phone within reach;with chair alarm set ?Nurse Communication: Mobility status ?PT Visit Diagnosis: Unsteadiness on feet (R26.81);Difficulty in walking, not elsewhere classified (R26.2);Pain ?  ? ? ?Time: 5573-2202 ?PT Time Calculation (min) (ACUTE ONLY): 38 min ? ?Charges:  $Gait Training: 8-22 mins ?$Therapeutic Exercise: 8-22 mins ?$Therapeutic Activity: 8-22 mins          ?          ?Zadie Cleverly, PTA ? ? ? ?Jannet Askew ?09/19/2021, 3:23 PM ? ?

## 2021-09-19 NOTE — Assessment & Plan Note (Addendum)
Secondary to above. ?-Continue with analgesia as needed, recently added oxycodone as tolerated and as needed ?

## 2021-09-20 DIAGNOSIS — M25559 Pain in unspecified hip: Secondary | ICD-10-CM | POA: Diagnosis not present

## 2021-09-20 DIAGNOSIS — E785 Hyperlipidemia, unspecified: Secondary | ICD-10-CM | POA: Diagnosis not present

## 2021-09-20 DIAGNOSIS — X58XXXA Exposure to other specified factors, initial encounter: Secondary | ICD-10-CM | POA: Diagnosis not present

## 2021-09-20 DIAGNOSIS — E559 Vitamin D deficiency, unspecified: Secondary | ICD-10-CM | POA: Diagnosis not present

## 2021-09-20 DIAGNOSIS — M8448XD Pathological fracture, other site, subsequent encounter for fracture with routine healing: Secondary | ICD-10-CM | POA: Diagnosis not present

## 2021-09-20 DIAGNOSIS — I10 Essential (primary) hypertension: Secondary | ICD-10-CM | POA: Diagnosis not present

## 2021-09-20 DIAGNOSIS — E782 Mixed hyperlipidemia: Secondary | ICD-10-CM | POA: Diagnosis not present

## 2021-09-20 DIAGNOSIS — M5126 Other intervertebral disc displacement, lumbar region: Secondary | ICD-10-CM | POA: Diagnosis not present

## 2021-09-20 DIAGNOSIS — R2681 Unsteadiness on feet: Secondary | ICD-10-CM | POA: Diagnosis not present

## 2021-09-20 DIAGNOSIS — M8000XD Age-related osteoporosis with current pathological fracture, unspecified site, subsequent encounter for fracture with routine healing: Secondary | ICD-10-CM | POA: Diagnosis not present

## 2021-09-20 DIAGNOSIS — S32040D Wedge compression fracture of fourth lumbar vertebra, subsequent encounter for fracture with routine healing: Secondary | ICD-10-CM | POA: Diagnosis not present

## 2021-09-20 DIAGNOSIS — M48061 Spinal stenosis, lumbar region without neurogenic claudication: Secondary | ICD-10-CM | POA: Diagnosis not present

## 2021-09-20 DIAGNOSIS — S32040A Wedge compression fracture of fourth lumbar vertebra, initial encounter for closed fracture: Secondary | ICD-10-CM | POA: Diagnosis not present

## 2021-09-20 DIAGNOSIS — I129 Hypertensive chronic kidney disease with stage 1 through stage 4 chronic kidney disease, or unspecified chronic kidney disease: Secondary | ICD-10-CM | POA: Diagnosis not present

## 2021-09-20 DIAGNOSIS — Z79899 Other long term (current) drug therapy: Secondary | ICD-10-CM | POA: Diagnosis not present

## 2021-09-20 DIAGNOSIS — I700 Atherosclerosis of aorta: Secondary | ICD-10-CM | POA: Diagnosis not present

## 2021-09-20 DIAGNOSIS — Z7982 Long term (current) use of aspirin: Secondary | ICD-10-CM | POA: Diagnosis not present

## 2021-09-20 DIAGNOSIS — M48062 Spinal stenosis, lumbar region with neurogenic claudication: Secondary | ICD-10-CM | POA: Diagnosis not present

## 2021-09-20 DIAGNOSIS — M2550 Pain in unspecified joint: Secondary | ICD-10-CM | POA: Diagnosis not present

## 2021-09-20 DIAGNOSIS — Z7401 Bed confinement status: Secondary | ICD-10-CM | POA: Diagnosis not present

## 2021-09-20 DIAGNOSIS — N182 Chronic kidney disease, stage 2 (mild): Secondary | ICD-10-CM | POA: Diagnosis not present

## 2021-09-20 LAB — CBC
HCT: 40.7 % (ref 36.0–46.0)
Hemoglobin: 13.5 g/dL (ref 12.0–15.0)
MCH: 34.1 pg — ABNORMAL HIGH (ref 26.0–34.0)
MCHC: 33.2 g/dL (ref 30.0–36.0)
MCV: 102.8 fL — ABNORMAL HIGH (ref 80.0–100.0)
Platelets: 253 10*3/uL (ref 150–400)
RBC: 3.96 MIL/uL (ref 3.87–5.11)
RDW: 12.9 % (ref 11.5–15.5)
WBC: 7.3 10*3/uL (ref 4.0–10.5)
nRBC: 0 % (ref 0.0–0.2)

## 2021-09-20 LAB — COMPREHENSIVE METABOLIC PANEL
ALT: 38 U/L (ref 0–44)
AST: 48 U/L — ABNORMAL HIGH (ref 15–41)
Albumin: 3.3 g/dL — ABNORMAL LOW (ref 3.5–5.0)
Alkaline Phosphatase: 93 U/L (ref 38–126)
Anion gap: 8 (ref 5–15)
BUN: 29 mg/dL — ABNORMAL HIGH (ref 8–23)
CO2: 26 mmol/L (ref 22–32)
Calcium: 9.1 mg/dL (ref 8.9–10.3)
Chloride: 101 mmol/L (ref 98–111)
Creatinine, Ser: 0.96 mg/dL (ref 0.44–1.00)
GFR, Estimated: 54 mL/min — ABNORMAL LOW (ref >60)
Glucose, Bld: 103 mg/dL — ABNORMAL HIGH (ref 70–99)
Potassium: 4.7 mmol/L (ref 3.5–5.1)
Sodium: 135 mmol/L (ref 135–145)
Total Bilirubin: 0.6 mg/dL (ref 0.3–1.2)
Total Protein: 6.6 g/dL (ref 6.5–8.1)

## 2021-09-20 MED ORDER — OXYCODONE HCL 5 MG PO TABS: 5.0000 mg | ORAL_TABLET | ORAL | 0 refills | Status: DC | PRN

## 2021-09-20 MED ORDER — DOCUSATE SODIUM 100 MG PO CAPS: 100.0000 mg | ORAL_CAPSULE | Freq: Two times a day (BID) | ORAL | 0 refills | Status: AC

## 2021-09-20 NOTE — Progress Notes (Signed)
Occupational Therapy Treatment ?Patient Details ?Name: Lauren Davidson ?MRN: [id number] ?DOB: 08/11/1921 ?Today's Date: 09/20/2021 ? ? ?History of present illness Lauren Davidson is a 88 y.o. female. PMH minimal. Resides at [facility]. Comes to ED 09/17/21  with a complaint of lowback pain.CT- There is 50% decrease in height of body of L4 vertebra. There is  mild retropulsion of upper endplate of body of L1 vertebraback pain radiating down her right leg its been going on for a few weeks.  She does not recall any trauma although states she did walk in a charity walk recently. ?  ?OT comments ? Patient sitting upright in bed upon arrival, wanting to get up for breakfast. Patient needing increased time for all mobility however supervision level for bed mobility. Verbal cues needed for safety to push from bed to stand and min G assist for steadying to transfer to recliner. Patient needing total A to don lumbar corset and reports pain with donning. Patient progressing towards acute OT goals, will continue to follow.   ? ?Recommendations for follow up therapy are one component of a multi-disciplinary discharge planning process, led by the attending physician.  Recommendations may be updated based on patient status, additional functional criteria and insurance authorization. ?   ?Follow Up Recommendations ? Skilled nursing-short term rehab (<3 hours/day)  ?  ?Assistance Recommended at Discharge Frequent or constant Supervision/Assistance  ?Patient can return home with the following ? A little help with walking and/or transfers;A lot of help with bathing/dressing/bathroom;Assistance with cooking/housework;Assist for transportation;Help with stairs or ramp for entrance ?  ?Equipment Recommendations ? None recommended by OT  ?  ?   ?Precautions / Restrictions Precautions ?Precautions: Fall ?Required Braces or Orthoses: Spinal Brace ?Spinal Brace: Lumbar corset;Applied in sitting position ?Restrictions ?Weight Bearing  Restrictions: No  ? ? ?  ? ?Mobility Bed Mobility ?Overal bed mobility: Needs Assistance ?Bed Mobility: Rolling, Sidelying to Sit ?Rolling: Supervision ?Sidelying to sit: Supervision, HOB elevated ?  ?  ?  ?General bed mobility comments: Supervision for safety ?  ? ? ?  ?Balance Overall balance assessment: Needs assistance ?Sitting-balance support: Feet supported ?Sitting balance-Leahy Scale: Good ?  ?  ?Standing balance support: Reliant on assistive device for balance, During functional activity ?Standing balance-Leahy Scale: Poor ?  ?  ?  ?  ?  ?  ?  ?  ?  ?  ?  ?  ?   ? ?ADL either performed or assessed with clinical judgement  ? ?ADL Overall ADL's : Needs assistance/impaired ?Eating/Feeding: Independent;Sitting ?  ?Grooming: Therapist, nutritional;Wash/dry hands;Set up;Sitting ?  ?  ?  ?  ?  ?  ?  ?  ?  ?Toilet Transfer: Min guard;Ambulation;Rolling walker (2 wheels);Cueing for safety ?Toilet Transfer Details (indicate cue type and reason): Patient needs verbal cues to push from bed vs pulling on walker to stand. Cues that it is a rolling walker vs rollator what patient is used to, she attempted to lock rolling walker. Min G for steadying ?  ?  ?  ?  ?Functional mobility during ADLs: Min guard;Rolling walker (2 wheels);Cueing for safety ?  ?  ? ? ? ?Cognition Arousal/Alertness: Awake/alert ?Behavior During Therapy: Lauren Davidson for tasks assessed/performed ?Overall Cognitive Status: Within Functional Limits for tasks assessed ?  ?  ?  ?  ?  ?  ?  ?  ?  ?  ?  ?  ?  ?  ?  ?  ?General Comments: Patient is very pleasant and  cooperative. Retired Charity fundraiser ?  ?  ?   ?   ?   ?   ? ? ?Pertinent Vitals/ Pain       Pain Assessment ?Pain Assessment: Faces ?Faces Pain Scale: Hurts little more ?Pain Location: Lumbar region radiating down R LE ?Pain Descriptors / Indicators: Discomfort, Aching ?Pain Intervention(s): Limited activity within patient's tolerance, Monitored during session ? ?   ?   ? ?Frequency ? Min 2X/week  ? ? ? ? ?  ?Progress Toward  Goals ? ?OT Goals(current goals can now be found in the care plan section) ? Progress towards OT goals: Progressing toward goals ? ?Acute Rehab OT Goals ?Patient Stated Goal: Eat breakfast ?OT Goal Formulation: With patient ?Time For Goal Achievement: 10/02/21 ?Potential to Achieve Goals: Good ?ADL Goals ?Pt Will Perform Lower Body Dressing: with min assist;with adaptive equipment;sit to/from stand;sitting/lateral leans ?Pt Will Transfer to Toilet: with supervision;bedside commode;ambulating (walker) ?Pt Will Perform Toileting - Clothing Manipulation and hygiene: with min assist;sit to/from stand;sitting/lateral leans ?Additional ADL Goal #1: Patient will tolerate 10 minutes of out of bed activity in order to participate in self care tasks.  ?Plan Discharge plan remains appropriate   ? ?   ?AM-PAC OT "6 Clicks" Daily Activity     ?Outcome Measure ? ? Help from another person eating meals?: None ?Help from another person taking care of personal grooming?: A Little ?Help from another person toileting, which includes using toliet, bedpan, or urinal?: A Lot ?Help from another person bathing (including washing, rinsing, drying)?: A Lot ?Help from another person to put on and taking off regular upper body clothing?: A Little ?Help from another person to put on and taking off regular lower body clothing?: Total ?6 Click Score: 15 ? ?  ?End of Session Equipment Utilized During Treatment: Rolling walker (2 wheels);Back brace ? ?OT Visit Diagnosis: Unsteadiness on feet (R26.81);Other abnormalities of gait and mobility (R26.89) ?  ?Activity Tolerance Patient tolerated treatment well ?  ?Patient Left in chair;with call bell/phone within reach ?  ?Nurse Communication Mobility status ?  ? ?   ? ?Time: 5035-4656 ?OT Time Calculation (min): 19 min ? ?Charges: OT General Charges ?$OT Visit: 1 Visit ?OT Treatments ?$Self Care/Home Management : 8-22 mins ? ?Marlyce Huge OT ?OT pager: [phone] ? ? ?Carmelia Roller ?09/20/2021, 9:53 AM ?

## 2021-09-20 NOTE — TOC Transition Note (Signed)
Transition of Care (TOC) - CM/SW Discharge Note ? ? ?Patient Details  ?Name: Lauren Davidson ?MRN: [id number] ?Date of Birth: [birth year] ? ?Transition of Care (TOC) CM/SW Contact:  ?Aleigh Grunden, LCSW ?Phone Number: ?09/20/2021, 3:47 PM ? ? ?Clinical Narrative:    ?Notified by facility they have received insurance auth and can admit pt today. MD has medically cleared for dc.  Pt and son-in-law aware and agreeable.  PTAR called at 3:45pm.  RN to call report to [phone].  No further TOC needs. ? ? ?Final next level of care: Skilled Nursing Facility ?Barriers to Discharge: Barriers Resolved ? ? ?Patient Goals and CMS Choice ?Patient states their goals for this hospitalization and ongoing recovery are:: eventually return to [place] apt at [facility] ?  ?  ? ?Discharge Placement ?  ?           ?Patient chooses bed at: [facility] ?Patient to be transferred to facility by: PTAR ?Name of family member notified: son-in-law, Loni Beckwith ?Patient and family notified of of transfer: 09/20/21 ? ?Discharge Plan and Services ?  ?  ?           ?DME Arranged: N/A ?DME Agency: NA ?  ?  ?  ?  ?  ?  ?  ?  ? ?Social Determinants of Health (SDOH) Interventions ?  ? ? ?Readmission Risk Interventions ?   ? View : No data to display.  ?  ?  ?  ? ? ? ? ? ?

## 2021-09-20 NOTE — TOC Progression Note (Signed)
Transition of Care (TOC) - Progression Note  ? ? ?Patient Details  ?Name: Lauren Davidson ?MRN: [id number] ?Date of Birth: Jul 01, 1921 ? ?Transition of Care (TOC) CM/SW Contact  ?Cadin Luka, LCSW ?Phone Number: ?09/20/2021, 2:37 PM ? ?Clinical Narrative:    ?Insurance auth still pending on SNF.  Have alerted MD/RN/patient and family. ? ? ?Expected Discharge Plan: Skilled Nursing Facility ?Barriers to Discharge: Insurance Authorization ? ?Expected Discharge Plan and Services ?Expected Discharge Plan: Skilled Nursing Facility ?  ?  ?  ?  ?                ?  ?  ?  ?  ?  ?  ?  ?  ?  ?  ? ? ?Social Determinants of Health (SDOH) Interventions ?  ? ?Readmission Risk Interventions ?   ? View : No data to display.  ?  ?  ?  ? ? ?

## 2021-09-20 NOTE — Discharge Summary (Addendum)
?Physician Discharge Summary ?  ?Patient: Lauren Davidson MRN: [id number] DOB: November 09, 1921  ?Admit date:     09/17/2021  ?Discharge date: 09/20/21  ?Discharge Physician: Marylu Lund  ? ?PCP: Patient, No Pcp Per (Inactive)  ? ?Recommendations at discharge:  ? ? Follow up with PCP in 1-2 weeks ?Recommend to avoid narcotic unless truly necessary and when participating with PT. Please ensure normal bowel movements while in rehab ? ?[place] reviewed. No recent controlled substances are listed. Limited quantity of controlled substance prescribed for SNF placement ? ?Discharge Diagnoses: ?Principal Problem: ?  Closed compression fracture of L4 lumbar vertebra, initial encounter (Lauren Davidson) ?Active Problems: ?  Back pain ?  HTN (hypertension) ?  HLD (hyperlipidemia) ? ?Resolved Problems: ?  Intractable pain ? ?Hospital Course: ?Taken from H&P. ? ? Lauren Davidson is a 88 y.o. female with medical history significant of HLD, GAD. Presenting with back pain. She reports that she was in her normal state of health until a couple of days ago. She began to feel sharp back pain radiating down her right leg. She says that she did a charity walk last week. She didn't have any falls or obvious injury at the time. It was 3 days after that when she began to have the back pain. She tried APAP and ibuprofen, but it didn't help. This morning when her pain was unresolved, she decided to come to the ED for help. She denies any other aggravating or alleviating factors.  ? ?CT Lumbar Spine w/o contrast ?1. Acute or subacute 60% compression fracture of L4. The fracture does involve the pedicles bilaterally and could potentially be unstable. ?2. 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 ?3. Moderate spinal and bilateral lateral recess stenosis and mild bilateral foraminal stenosis at L3-4. ?4. Left paracentral and foraminal disc protrusion at L2-3 contacting and slightly displacing  the left L2 nerve. ? ?09/19/21: Patient was still having pain with ambulation, no pain while lying down.  No other complaints.  Neurosurgery was consulted from ED and they are recommending back brace and conservative management.  No surgical intervention planned. ?PT and OT are recommending rehab-TOC started working on it. ?Patient lives alone in an independent living facility, at baseline she was independent for ADLs. ? 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 ?EDP spoke with neurosurgery; recommend bracing and PT/OT; no surgical intervention planned. ?PT/OT are recommending SNF, pending. TOC following ?-Continue with pain management and conservative measures ? ?Back pain ?Secondary to above. ?-Continue with analgesia as needed, recently added oxycodone as tolerated and as needed ? ?HTN (hypertension) ?Blood pressure within normal limit.  Patient was not on any antihypertensives at home. ?-Continue to monitor ? ? ?HLD (hyperlipidemia) ?Not on any medication at home.  Just taking fish oil. ? ? ? ? ?  ? ? ?Consultants:  ?Disposition: Skilled nursing facility ?Diet recommendation:  ?Cardiac diet ?DISCHARGE MEDICATION: ?Allergies as of 09/20/2021   ? ?   Reactions  ? Statins Other (See Comments)  ? Muscle weakness and pain  ? Buprenorphine Hcl Other (See Comments)  ? Patient questioned this entry  ? Buspirone Other (See Comments)  ? Weakness  ? Lexapro [escitalopram] Other (See Comments)  ? "Muscle problems"  ? Lovastatin Other (See Comments)  ? Muscle pain  ? Meperidine Other (See Comments)  ? Demerol- questionable allergy-  reaction not recalled  ? Morphine And Related Rash  ? ?  ? ?  ?Medication List  ?  ? ?TAKE these medications   ? ?acetaminophen 500 MG tablet ?Commonly known as: TYLENOL ?Take 500 mg by mouth every 6 (six) hours as needed (for  pain). ?  ?CALCIUM+D3 PO ?Take 1 tablet by mouth daily with breakfast. ?  ?docusate sodium 100 MG capsule ?Commonly known as: COLACE ?Take 1 capsule (100 mg total) by mouth 2 (two) times daily. ?  ?fish oil-omega-3 fatty acids 1000 MG capsule ?Take 1,000 mg by mouth 2 (two) times daily after a meal. ?  ?lidocaine 5 % ?Commonly known as: Lidoderm ?Place 1 patch onto the skin daily. Remove & Discard patch within 12 hours or as directed by MD ?  ?meloxicam 15 MG tablet ?Commonly known as: MOBIC ?Take 15 mg by mouth daily. ?  ?PreserVision AREDS 2 Caps ?Take 1 capsule by mouth in the morning and at bedtime. ?  ?multivitamin with minerals tablet ?Take 1 tablet by mouth daily with lunch. ?  ?oxyCODONE 5 MG immediate release tablet ?Commonly known as: Oxy IR/ROXICODONE ?Take 1 tablet (5 mg total) by mouth every 4 (four) hours as needed for severe pain. ?  ?Systane Overnight Therapy 0.3 % Gel ophthalmic ointment ?Generic drug: hypromellose ?Place 1 application. into both eyes at bedtime. ?  ?Systane Ultra PF 0.4-0.3 % Soln ?Generic drug: Polyethyl Glyc-Propyl Glyc PF ?Place 1 drop into both eyes 4 (four) times daily. ?  ? ?  ? ? Contact information for after-discharge care   ? ? Destination   ? ? [facility] SNF[facility] .   ?Service: Skilled Nursing ?Contact information: ?[address] ?[place] ?[phone] ? ?  ?  ? ?  ?  ? ?  ?  ? ?  ? ?Discharge Exam: ?Danley Danker Weights  ? 09/17/21 0825  ?Weight: 63.5 kg  ? ?General exam: Awake, laying in bed, in nad ?Respiratory system: Normal respiratory effort, no wheezing ?Cardiovascular system: regular rate, s1, s2 ?Gastrointestinal system: Soft, nondistended, positive BS ?Central nervous system: CN2-12 grossly intact, strength intact ?Extremities: Perfused, no clubbing ?Skin: Normal skin turgor, no notable skin lesions seen ?Psychiatry: Mood normal // no visual hallucinations  ? ?Condition at discharge: fair ? ?The results of  significant diagnostics from this hospitalization (including imaging, microbiology, ancillary and laboratory) are listed below for reference.  ? ?Imaging Studies: ?DG Lumbar Spine Complete ? ?Result Date: 09/10/2021 ?CLINICAL DATA:  Acute low back pain EXAM: LUMBAR SPINE - COMPLETE 4+ VIEW COMPARISON:  None. FINDINGS: There is 50% decrease in height of body of L4 vertebra. There is mild retropulsion of upper endplate of body of L1 vertebra. Degenerative changes are noted with bony spurs and facet hypertrophy at multiple levels. Arterial calcifications are seen in the soft tissues. IMPRESSION: There is compression fracture with 50% decrease in height in the body of L4 vertebra which may suggest recent or old compression fracture. Lumbar spondylosis. Electronically Signed   By: Elmer Picker M.D.   On: 09/10/2021 13:47  ? ?CT Lumbar Spine Wo Contrast ? ?Result Date: 09/17/2021 ?CLINICAL DATA:  [place]. Compression fracture. EXAM: CT LUMBAR SPINE WITHOUT CONTRAST TECHNIQUE: Multidetector CT imaging of the lumbar spine was performed without intravenous contrast administration. Multiplanar CT image reconstructions were also generated. RADIATION DOSE REDUCTION: This exam was performed according to the departmental dose-optimization program which includes automated exposure control, adjustment of the mA and/or kV according to patient size  and/or use of iterative reconstruction technique. COMPARISON:  Lumbar spine radiographs 09/07/2021 FINDINGS: Segmentation: There are five lumbar type vertebral bodies. The last full intervertebral disc space is labeled L5-S1. Alignment: Normal overall alignment. Vertebrae: Diffuse osteoporosis. There is a 60% compression fracture the L4 vertebral body. Associated retropulsion. The fracture does involve the pedicles bilaterally and could potentially be unstable. The facets are maintained. No other fractures are identified. Paraspinal and other soft tissues: Paraspinal hematoma at L4.  Advanced aortic calcifications but no aneurysm. Disc levels: T12-L1: No significant findings. L1-2: Mild annular bulge and mild osteophytic ridging but no significant spinal or foraminal stenosis. L2-3: Left paracentral

## 2021-09-21 DIAGNOSIS — N182 Chronic kidney disease, stage 2 (mild): Secondary | ICD-10-CM | POA: Diagnosis not present

## 2021-09-21 DIAGNOSIS — M48062 Spinal stenosis, lumbar region with neurogenic claudication: Secondary | ICD-10-CM | POA: Diagnosis not present

## 2021-09-21 DIAGNOSIS — S32040D Wedge compression fracture of fourth lumbar vertebra, subsequent encounter for fracture with routine healing: Secondary | ICD-10-CM | POA: Diagnosis not present

## 2021-09-21 DIAGNOSIS — I129 Hypertensive chronic kidney disease with stage 1 through stage 4 chronic kidney disease, or unspecified chronic kidney disease: Secondary | ICD-10-CM | POA: Diagnosis not present

## 2021-09-21 DIAGNOSIS — E782 Mixed hyperlipidemia: Secondary | ICD-10-CM | POA: Diagnosis not present

## 2021-09-21 DIAGNOSIS — M8000XD Age-related osteoporosis with current pathological fracture, unspecified site, subsequent encounter for fracture with routine healing: Secondary | ICD-10-CM | POA: Diagnosis not present

## 2021-10-04 DIAGNOSIS — M48061 Spinal stenosis, lumbar region without neurogenic claudication: Secondary | ICD-10-CM | POA: Diagnosis not present

## 2021-10-04 DIAGNOSIS — E785 Hyperlipidemia, unspecified: Secondary | ICD-10-CM | POA: Diagnosis not present

## 2021-10-04 DIAGNOSIS — S32040A Wedge compression fracture of fourth lumbar vertebra, initial encounter for closed fracture: Secondary | ICD-10-CM | POA: Diagnosis not present

## 2021-10-04 DIAGNOSIS — I10 Essential (primary) hypertension: Secondary | ICD-10-CM | POA: Diagnosis not present

## 2021-10-09 DIAGNOSIS — R488 Other symbolic dysfunctions: Secondary | ICD-10-CM | POA: Diagnosis not present

## 2021-10-09 DIAGNOSIS — R2689 Other abnormalities of gait and mobility: Secondary | ICD-10-CM | POA: Diagnosis not present

## 2021-10-09 DIAGNOSIS — R41841 Cognitive communication deficit: Secondary | ICD-10-CM | POA: Diagnosis not present

## 2021-10-09 DIAGNOSIS — R490 Dysphonia: Secondary | ICD-10-CM | POA: Diagnosis not present

## 2021-10-09 DIAGNOSIS — M6281 Muscle weakness (generalized): Secondary | ICD-10-CM | POA: Diagnosis not present

## 2021-10-10 DIAGNOSIS — M6281 Muscle weakness (generalized): Secondary | ICD-10-CM | POA: Diagnosis not present

## 2021-10-10 DIAGNOSIS — R2689 Other abnormalities of gait and mobility: Secondary | ICD-10-CM | POA: Diagnosis not present

## 2021-10-11 DIAGNOSIS — K5900 Constipation, unspecified: Secondary | ICD-10-CM | POA: Diagnosis not present

## 2021-10-11 DIAGNOSIS — S32040A Wedge compression fracture of fourth lumbar vertebra, initial encounter for closed fracture: Secondary | ICD-10-CM | POA: Diagnosis not present

## 2021-10-11 DIAGNOSIS — M6281 Muscle weakness (generalized): Secondary | ICD-10-CM | POA: Diagnosis not present

## 2021-10-11 DIAGNOSIS — R2689 Other abnormalities of gait and mobility: Secondary | ICD-10-CM | POA: Diagnosis not present

## 2021-10-11 DIAGNOSIS — M3500 Sicca syndrome, unspecified: Secondary | ICD-10-CM | POA: Diagnosis not present

## 2021-10-12 DIAGNOSIS — R488 Other symbolic dysfunctions: Secondary | ICD-10-CM | POA: Diagnosis not present

## 2021-10-12 DIAGNOSIS — M6281 Muscle weakness (generalized): Secondary | ICD-10-CM | POA: Diagnosis not present

## 2021-10-12 DIAGNOSIS — R41841 Cognitive communication deficit: Secondary | ICD-10-CM | POA: Diagnosis not present

## 2021-10-12 DIAGNOSIS — R490 Dysphonia: Secondary | ICD-10-CM | POA: Diagnosis not present

## 2021-10-16 DIAGNOSIS — M6281 Muscle weakness (generalized): Secondary | ICD-10-CM | POA: Diagnosis not present

## 2021-10-17 DIAGNOSIS — M6281 Muscle weakness (generalized): Secondary | ICD-10-CM | POA: Diagnosis not present

## 2021-10-17 DIAGNOSIS — R2689 Other abnormalities of gait and mobility: Secondary | ICD-10-CM | POA: Diagnosis not present

## 2021-10-18 DIAGNOSIS — R2689 Other abnormalities of gait and mobility: Secondary | ICD-10-CM | POA: Diagnosis not present

## 2021-10-18 DIAGNOSIS — M6281 Muscle weakness (generalized): Secondary | ICD-10-CM | POA: Diagnosis not present

## 2021-10-19 DIAGNOSIS — R2689 Other abnormalities of gait and mobility: Secondary | ICD-10-CM | POA: Diagnosis not present

## 2021-10-19 DIAGNOSIS — M6281 Muscle weakness (generalized): Secondary | ICD-10-CM | POA: Diagnosis not present

## 2021-10-22 DIAGNOSIS — M6281 Muscle weakness (generalized): Secondary | ICD-10-CM | POA: Diagnosis not present

## 2021-10-22 DIAGNOSIS — R2689 Other abnormalities of gait and mobility: Secondary | ICD-10-CM | POA: Diagnosis not present

## 2021-10-23 DIAGNOSIS — R2689 Other abnormalities of gait and mobility: Secondary | ICD-10-CM | POA: Diagnosis not present

## 2021-10-23 DIAGNOSIS — M6281 Muscle weakness (generalized): Secondary | ICD-10-CM | POA: Diagnosis not present

## 2021-10-24 DIAGNOSIS — M6281 Muscle weakness (generalized): Secondary | ICD-10-CM | POA: Diagnosis not present

## 2021-10-24 DIAGNOSIS — R490 Dysphonia: Secondary | ICD-10-CM | POA: Diagnosis not present

## 2021-10-24 DIAGNOSIS — R488 Other symbolic dysfunctions: Secondary | ICD-10-CM | POA: Diagnosis not present

## 2021-10-24 DIAGNOSIS — R41841 Cognitive communication deficit: Secondary | ICD-10-CM | POA: Diagnosis not present

## 2021-10-24 DIAGNOSIS — R2689 Other abnormalities of gait and mobility: Secondary | ICD-10-CM | POA: Diagnosis not present

## 2021-10-25 DIAGNOSIS — M6281 Muscle weakness (generalized): Secondary | ICD-10-CM | POA: Diagnosis not present

## 2021-10-25 DIAGNOSIS — R2689 Other abnormalities of gait and mobility: Secondary | ICD-10-CM | POA: Diagnosis not present

## 2021-10-26 DIAGNOSIS — M6281 Muscle weakness (generalized): Secondary | ICD-10-CM | POA: Diagnosis not present

## 2021-10-27 DIAGNOSIS — R488 Other symbolic dysfunctions: Secondary | ICD-10-CM | POA: Diagnosis not present

## 2021-10-27 DIAGNOSIS — R490 Dysphonia: Secondary | ICD-10-CM | POA: Diagnosis not present

## 2021-10-27 DIAGNOSIS — R41841 Cognitive communication deficit: Secondary | ICD-10-CM | POA: Diagnosis not present

## 2021-10-29 DIAGNOSIS — M6281 Muscle weakness (generalized): Secondary | ICD-10-CM | POA: Diagnosis not present

## 2021-10-29 DIAGNOSIS — R2689 Other abnormalities of gait and mobility: Secondary | ICD-10-CM | POA: Diagnosis not present

## 2021-10-30 DIAGNOSIS — R41841 Cognitive communication deficit: Secondary | ICD-10-CM | POA: Diagnosis not present

## 2021-10-30 DIAGNOSIS — R2689 Other abnormalities of gait and mobility: Secondary | ICD-10-CM | POA: Diagnosis not present

## 2021-10-30 DIAGNOSIS — R488 Other symbolic dysfunctions: Secondary | ICD-10-CM | POA: Diagnosis not present

## 2021-10-30 DIAGNOSIS — R490 Dysphonia: Secondary | ICD-10-CM | POA: Diagnosis not present

## 2021-10-30 DIAGNOSIS — M6281 Muscle weakness (generalized): Secondary | ICD-10-CM | POA: Diagnosis not present

## 2021-10-31 DIAGNOSIS — R2689 Other abnormalities of gait and mobility: Secondary | ICD-10-CM | POA: Diagnosis not present

## 2021-10-31 DIAGNOSIS — R41841 Cognitive communication deficit: Secondary | ICD-10-CM | POA: Diagnosis not present

## 2021-10-31 DIAGNOSIS — R488 Other symbolic dysfunctions: Secondary | ICD-10-CM | POA: Diagnosis not present

## 2021-10-31 DIAGNOSIS — R490 Dysphonia: Secondary | ICD-10-CM | POA: Diagnosis not present

## 2021-10-31 DIAGNOSIS — M6281 Muscle weakness (generalized): Secondary | ICD-10-CM | POA: Diagnosis not present

## 2021-11-01 DIAGNOSIS — R41841 Cognitive communication deficit: Secondary | ICD-10-CM | POA: Diagnosis not present

## 2021-11-01 DIAGNOSIS — R490 Dysphonia: Secondary | ICD-10-CM | POA: Diagnosis not present

## 2021-11-01 DIAGNOSIS — M6281 Muscle weakness (generalized): Secondary | ICD-10-CM | POA: Diagnosis not present

## 2021-11-01 DIAGNOSIS — R2689 Other abnormalities of gait and mobility: Secondary | ICD-10-CM | POA: Diagnosis not present

## 2021-11-01 DIAGNOSIS — R488 Other symbolic dysfunctions: Secondary | ICD-10-CM | POA: Diagnosis not present

## 2021-11-02 DIAGNOSIS — S32040D Wedge compression fracture of fourth lumbar vertebra, subsequent encounter for fracture with routine healing: Secondary | ICD-10-CM | POA: Diagnosis not present

## 2021-11-02 DIAGNOSIS — M8000XD Age-related osteoporosis with current pathological fracture, unspecified site, subsequent encounter for fracture with routine healing: Secondary | ICD-10-CM | POA: Diagnosis not present

## 2021-11-03 DIAGNOSIS — M6281 Muscle weakness (generalized): Secondary | ICD-10-CM | POA: Diagnosis not present

## 2021-11-05 DIAGNOSIS — R488 Other symbolic dysfunctions: Secondary | ICD-10-CM | POA: Diagnosis not present

## 2021-11-05 DIAGNOSIS — R41841 Cognitive communication deficit: Secondary | ICD-10-CM | POA: Diagnosis not present

## 2021-11-05 DIAGNOSIS — M6281 Muscle weakness (generalized): Secondary | ICD-10-CM | POA: Diagnosis not present

## 2021-11-05 DIAGNOSIS — R490 Dysphonia: Secondary | ICD-10-CM | POA: Diagnosis not present

## 2021-11-05 DIAGNOSIS — R2689 Other abnormalities of gait and mobility: Secondary | ICD-10-CM | POA: Diagnosis not present

## 2021-11-06 DIAGNOSIS — R2689 Other abnormalities of gait and mobility: Secondary | ICD-10-CM | POA: Diagnosis not present

## 2021-11-06 DIAGNOSIS — M6281 Muscle weakness (generalized): Secondary | ICD-10-CM | POA: Diagnosis not present

## 2021-11-07 DIAGNOSIS — M6281 Muscle weakness (generalized): Secondary | ICD-10-CM | POA: Diagnosis not present

## 2021-11-07 DIAGNOSIS — R2689 Other abnormalities of gait and mobility: Secondary | ICD-10-CM | POA: Diagnosis not present

## 2021-11-08 DIAGNOSIS — R41841 Cognitive communication deficit: Secondary | ICD-10-CM | POA: Diagnosis not present

## 2021-11-08 DIAGNOSIS — M6281 Muscle weakness (generalized): Secondary | ICD-10-CM | POA: Diagnosis not present

## 2021-11-08 DIAGNOSIS — R488 Other symbolic dysfunctions: Secondary | ICD-10-CM | POA: Diagnosis not present

## 2021-11-08 DIAGNOSIS — R2689 Other abnormalities of gait and mobility: Secondary | ICD-10-CM | POA: Diagnosis not present

## 2021-11-08 DIAGNOSIS — R490 Dysphonia: Secondary | ICD-10-CM | POA: Diagnosis not present

## 2021-11-09 DIAGNOSIS — R488 Other symbolic dysfunctions: Secondary | ICD-10-CM | POA: Diagnosis not present

## 2021-11-09 DIAGNOSIS — R490 Dysphonia: Secondary | ICD-10-CM | POA: Diagnosis not present

## 2021-11-09 DIAGNOSIS — R41841 Cognitive communication deficit: Secondary | ICD-10-CM | POA: Diagnosis not present

## 2021-11-12 DIAGNOSIS — R41841 Cognitive communication deficit: Secondary | ICD-10-CM | POA: Diagnosis not present

## 2021-11-12 DIAGNOSIS — M6281 Muscle weakness (generalized): Secondary | ICD-10-CM | POA: Diagnosis not present

## 2021-11-12 DIAGNOSIS — R490 Dysphonia: Secondary | ICD-10-CM | POA: Diagnosis not present

## 2021-11-12 DIAGNOSIS — R488 Other symbolic dysfunctions: Secondary | ICD-10-CM | POA: Diagnosis not present

## 2021-11-12 DIAGNOSIS — R2689 Other abnormalities of gait and mobility: Secondary | ICD-10-CM | POA: Diagnosis not present

## 2021-11-13 DIAGNOSIS — M6281 Muscle weakness (generalized): Secondary | ICD-10-CM | POA: Diagnosis not present

## 2021-11-14 DIAGNOSIS — R41841 Cognitive communication deficit: Secondary | ICD-10-CM | POA: Diagnosis not present

## 2021-11-14 DIAGNOSIS — M6281 Muscle weakness (generalized): Secondary | ICD-10-CM | POA: Diagnosis not present

## 2021-11-14 DIAGNOSIS — R2689 Other abnormalities of gait and mobility: Secondary | ICD-10-CM | POA: Diagnosis not present

## 2021-11-14 DIAGNOSIS — R488 Other symbolic dysfunctions: Secondary | ICD-10-CM | POA: Diagnosis not present

## 2021-11-14 DIAGNOSIS — R490 Dysphonia: Secondary | ICD-10-CM | POA: Diagnosis not present

## 2021-11-15 DIAGNOSIS — R2689 Other abnormalities of gait and mobility: Secondary | ICD-10-CM | POA: Diagnosis not present

## 2021-11-15 DIAGNOSIS — R41841 Cognitive communication deficit: Secondary | ICD-10-CM | POA: Diagnosis not present

## 2021-11-15 DIAGNOSIS — M6281 Muscle weakness (generalized): Secondary | ICD-10-CM | POA: Diagnosis not present

## 2021-11-15 DIAGNOSIS — R490 Dysphonia: Secondary | ICD-10-CM | POA: Diagnosis not present

## 2021-11-15 DIAGNOSIS — R488 Other symbolic dysfunctions: Secondary | ICD-10-CM | POA: Diagnosis not present

## 2021-11-16 DIAGNOSIS — R2689 Other abnormalities of gait and mobility: Secondary | ICD-10-CM | POA: Diagnosis not present

## 2021-11-16 DIAGNOSIS — M6281 Muscle weakness (generalized): Secondary | ICD-10-CM | POA: Diagnosis not present

## 2021-11-19 DIAGNOSIS — R2689 Other abnormalities of gait and mobility: Secondary | ICD-10-CM | POA: Diagnosis not present

## 2021-11-19 DIAGNOSIS — M6281 Muscle weakness (generalized): Secondary | ICD-10-CM | POA: Diagnosis not present

## 2021-11-21 DIAGNOSIS — R2689 Other abnormalities of gait and mobility: Secondary | ICD-10-CM | POA: Diagnosis not present

## 2021-11-21 DIAGNOSIS — M6281 Muscle weakness (generalized): Secondary | ICD-10-CM | POA: Diagnosis not present

## 2021-11-22 DIAGNOSIS — M6281 Muscle weakness (generalized): Secondary | ICD-10-CM | POA: Diagnosis not present

## 2021-11-22 DIAGNOSIS — R41841 Cognitive communication deficit: Secondary | ICD-10-CM | POA: Diagnosis not present

## 2021-11-22 DIAGNOSIS — R488 Other symbolic dysfunctions: Secondary | ICD-10-CM | POA: Diagnosis not present

## 2021-11-22 DIAGNOSIS — R490 Dysphonia: Secondary | ICD-10-CM | POA: Diagnosis not present

## 2021-11-22 DIAGNOSIS — R2689 Other abnormalities of gait and mobility: Secondary | ICD-10-CM | POA: Diagnosis not present

## 2021-11-23 DIAGNOSIS — R2689 Other abnormalities of gait and mobility: Secondary | ICD-10-CM | POA: Diagnosis not present

## 2021-11-23 DIAGNOSIS — M6281 Muscle weakness (generalized): Secondary | ICD-10-CM | POA: Diagnosis not present

## 2021-11-24 DIAGNOSIS — R488 Other symbolic dysfunctions: Secondary | ICD-10-CM | POA: Diagnosis not present

## 2021-11-24 DIAGNOSIS — R490 Dysphonia: Secondary | ICD-10-CM | POA: Diagnosis not present

## 2021-11-24 DIAGNOSIS — R41841 Cognitive communication deficit: Secondary | ICD-10-CM | POA: Diagnosis not present

## 2021-11-26 DIAGNOSIS — R490 Dysphonia: Secondary | ICD-10-CM | POA: Diagnosis not present

## 2021-11-26 DIAGNOSIS — M6281 Muscle weakness (generalized): Secondary | ICD-10-CM | POA: Diagnosis not present

## 2021-11-26 DIAGNOSIS — R488 Other symbolic dysfunctions: Secondary | ICD-10-CM | POA: Diagnosis not present

## 2021-11-26 DIAGNOSIS — R41841 Cognitive communication deficit: Secondary | ICD-10-CM | POA: Diagnosis not present

## 2021-11-26 DIAGNOSIS — R2689 Other abnormalities of gait and mobility: Secondary | ICD-10-CM | POA: Diagnosis not present

## 2021-11-27 DIAGNOSIS — R490 Dysphonia: Secondary | ICD-10-CM | POA: Diagnosis not present

## 2021-11-27 DIAGNOSIS — M6281 Muscle weakness (generalized): Secondary | ICD-10-CM | POA: Diagnosis not present

## 2021-11-27 DIAGNOSIS — R2689 Other abnormalities of gait and mobility: Secondary | ICD-10-CM | POA: Diagnosis not present

## 2021-11-27 DIAGNOSIS — R488 Other symbolic dysfunctions: Secondary | ICD-10-CM | POA: Diagnosis not present

## 2021-11-27 DIAGNOSIS — R41841 Cognitive communication deficit: Secondary | ICD-10-CM | POA: Diagnosis not present

## 2021-11-28 DIAGNOSIS — R490 Dysphonia: Secondary | ICD-10-CM | POA: Diagnosis not present

## 2021-11-28 DIAGNOSIS — R488 Other symbolic dysfunctions: Secondary | ICD-10-CM | POA: Diagnosis not present

## 2021-11-28 DIAGNOSIS — R41841 Cognitive communication deficit: Secondary | ICD-10-CM | POA: Diagnosis not present

## 2021-11-28 DIAGNOSIS — M6281 Muscle weakness (generalized): Secondary | ICD-10-CM | POA: Diagnosis not present

## 2021-11-29 DIAGNOSIS — R41841 Cognitive communication deficit: Secondary | ICD-10-CM | POA: Diagnosis not present

## 2021-11-29 DIAGNOSIS — M6281 Muscle weakness (generalized): Secondary | ICD-10-CM | POA: Diagnosis not present

## 2021-11-29 DIAGNOSIS — R488 Other symbolic dysfunctions: Secondary | ICD-10-CM | POA: Diagnosis not present

## 2021-11-29 DIAGNOSIS — R490 Dysphonia: Secondary | ICD-10-CM | POA: Diagnosis not present

## 2021-11-29 DIAGNOSIS — R2689 Other abnormalities of gait and mobility: Secondary | ICD-10-CM | POA: Diagnosis not present

## 2021-11-30 DIAGNOSIS — M6281 Muscle weakness (generalized): Secondary | ICD-10-CM | POA: Diagnosis not present

## 2021-12-04 DIAGNOSIS — R2689 Other abnormalities of gait and mobility: Secondary | ICD-10-CM | POA: Diagnosis not present

## 2021-12-04 DIAGNOSIS — M6281 Muscle weakness (generalized): Secondary | ICD-10-CM | POA: Diagnosis not present

## 2021-12-05 ENCOUNTER — Other Ambulatory Visit: Payer: Self-pay | Admitting: Internal Medicine

## 2021-12-05 ENCOUNTER — Ambulatory Visit
Admission: RE | Admit: 2021-12-05 | Discharge: 2021-12-05 | Disposition: A | Discharge status: Home / Self Care | Payer: Medicare HMO | Source: Ambulatory Visit | Attending: Internal Medicine | Admitting: Internal Medicine

## 2021-12-05 DIAGNOSIS — S32050D Wedge compression fracture of fifth lumbar vertebra, subsequent encounter for fracture with routine healing: Secondary | ICD-10-CM

## 2021-12-05 DIAGNOSIS — M545 Low back pain, unspecified: Secondary | ICD-10-CM | POA: Diagnosis not present

## 2021-12-05 DIAGNOSIS — M7989 Other specified soft tissue disorders: Secondary | ICD-10-CM | POA: Diagnosis not present

## 2021-12-05 DIAGNOSIS — R5383 Other fatigue: Secondary | ICD-10-CM | POA: Diagnosis not present

## 2021-12-05 DIAGNOSIS — R262 Difficulty in walking, not elsewhere classified: Secondary | ICD-10-CM | POA: Diagnosis not present

## 2021-12-05 DIAGNOSIS — R0602 Shortness of breath: Secondary | ICD-10-CM | POA: Diagnosis not present

## 2021-12-06 DIAGNOSIS — R2689 Other abnormalities of gait and mobility: Secondary | ICD-10-CM | POA: Diagnosis not present

## 2021-12-06 DIAGNOSIS — M6281 Muscle weakness (generalized): Secondary | ICD-10-CM | POA: Diagnosis not present

## 2021-12-07 DIAGNOSIS — M6281 Muscle weakness (generalized): Secondary | ICD-10-CM | POA: Diagnosis not present

## 2021-12-07 DIAGNOSIS — R2689 Other abnormalities of gait and mobility: Secondary | ICD-10-CM | POA: Diagnosis not present

## 2021-12-10 DIAGNOSIS — M6281 Muscle weakness (generalized): Secondary | ICD-10-CM | POA: Diagnosis not present

## 2021-12-10 DIAGNOSIS — R2689 Other abnormalities of gait and mobility: Secondary | ICD-10-CM | POA: Diagnosis not present

## 2021-12-18 DIAGNOSIS — R490 Dysphonia: Secondary | ICD-10-CM | POA: Diagnosis not present

## 2021-12-18 DIAGNOSIS — R2689 Other abnormalities of gait and mobility: Secondary | ICD-10-CM | POA: Diagnosis not present

## 2021-12-18 DIAGNOSIS — R41841 Cognitive communication deficit: Secondary | ICD-10-CM | POA: Diagnosis not present

## 2021-12-18 DIAGNOSIS — R488 Other symbolic dysfunctions: Secondary | ICD-10-CM | POA: Diagnosis not present

## 2021-12-18 DIAGNOSIS — M6281 Muscle weakness (generalized): Secondary | ICD-10-CM | POA: Diagnosis not present

## 2021-12-19 DIAGNOSIS — R488 Other symbolic dysfunctions: Secondary | ICD-10-CM | POA: Diagnosis not present

## 2021-12-19 DIAGNOSIS — R41841 Cognitive communication deficit: Secondary | ICD-10-CM | POA: Diagnosis not present

## 2021-12-19 DIAGNOSIS — M6281 Muscle weakness (generalized): Secondary | ICD-10-CM | POA: Diagnosis not present

## 2021-12-19 DIAGNOSIS — R490 Dysphonia: Secondary | ICD-10-CM | POA: Diagnosis not present

## 2021-12-19 DIAGNOSIS — R2689 Other abnormalities of gait and mobility: Secondary | ICD-10-CM | POA: Diagnosis not present

## 2021-12-20 DIAGNOSIS — M6281 Muscle weakness (generalized): Secondary | ICD-10-CM | POA: Diagnosis not present

## 2021-12-20 DIAGNOSIS — R2689 Other abnormalities of gait and mobility: Secondary | ICD-10-CM | POA: Diagnosis not present

## 2021-12-21 DIAGNOSIS — R2689 Other abnormalities of gait and mobility: Secondary | ICD-10-CM | POA: Diagnosis not present

## 2021-12-21 DIAGNOSIS — M6281 Muscle weakness (generalized): Secondary | ICD-10-CM | POA: Diagnosis not present

## 2021-12-24 DIAGNOSIS — R2689 Other abnormalities of gait and mobility: Secondary | ICD-10-CM | POA: Diagnosis not present

## 2021-12-24 DIAGNOSIS — M6281 Muscle weakness (generalized): Secondary | ICD-10-CM | POA: Diagnosis not present

## 2021-12-25 DIAGNOSIS — M6281 Muscle weakness (generalized): Secondary | ICD-10-CM | POA: Diagnosis not present

## 2021-12-26 DIAGNOSIS — R41841 Cognitive communication deficit: Secondary | ICD-10-CM | POA: Diagnosis not present

## 2021-12-26 DIAGNOSIS — M6281 Muscle weakness (generalized): Secondary | ICD-10-CM | POA: Diagnosis not present

## 2021-12-26 DIAGNOSIS — R488 Other symbolic dysfunctions: Secondary | ICD-10-CM | POA: Diagnosis not present

## 2021-12-26 DIAGNOSIS — R2689 Other abnormalities of gait and mobility: Secondary | ICD-10-CM | POA: Diagnosis not present

## 2021-12-26 DIAGNOSIS — R490 Dysphonia: Secondary | ICD-10-CM | POA: Diagnosis not present

## 2021-12-27 DIAGNOSIS — R488 Other symbolic dysfunctions: Secondary | ICD-10-CM | POA: Diagnosis not present

## 2021-12-27 DIAGNOSIS — M6281 Muscle weakness (generalized): Secondary | ICD-10-CM | POA: Diagnosis not present

## 2021-12-27 DIAGNOSIS — R41841 Cognitive communication deficit: Secondary | ICD-10-CM | POA: Diagnosis not present

## 2021-12-27 DIAGNOSIS — M5459 Other low back pain: Secondary | ICD-10-CM | POA: Diagnosis not present

## 2021-12-27 DIAGNOSIS — R2689 Other abnormalities of gait and mobility: Secondary | ICD-10-CM | POA: Diagnosis not present

## 2021-12-27 DIAGNOSIS — R490 Dysphonia: Secondary | ICD-10-CM | POA: Diagnosis not present

## 2021-12-28 DIAGNOSIS — R41841 Cognitive communication deficit: Secondary | ICD-10-CM | POA: Diagnosis not present

## 2021-12-28 DIAGNOSIS — R490 Dysphonia: Secondary | ICD-10-CM | POA: Diagnosis not present

## 2021-12-28 DIAGNOSIS — R2689 Other abnormalities of gait and mobility: Secondary | ICD-10-CM | POA: Diagnosis not present

## 2021-12-28 DIAGNOSIS — R488 Other symbolic dysfunctions: Secondary | ICD-10-CM | POA: Diagnosis not present

## 2021-12-28 DIAGNOSIS — M6281 Muscle weakness (generalized): Secondary | ICD-10-CM | POA: Diagnosis not present

## 2021-12-31 DIAGNOSIS — R488 Other symbolic dysfunctions: Secondary | ICD-10-CM | POA: Diagnosis not present

## 2021-12-31 DIAGNOSIS — R2689 Other abnormalities of gait and mobility: Secondary | ICD-10-CM | POA: Diagnosis not present

## 2021-12-31 DIAGNOSIS — R490 Dysphonia: Secondary | ICD-10-CM | POA: Diagnosis not present

## 2021-12-31 DIAGNOSIS — M6281 Muscle weakness (generalized): Secondary | ICD-10-CM | POA: Diagnosis not present

## 2021-12-31 DIAGNOSIS — R41841 Cognitive communication deficit: Secondary | ICD-10-CM | POA: Diagnosis not present

## 2022-01-01 DIAGNOSIS — R2689 Other abnormalities of gait and mobility: Secondary | ICD-10-CM | POA: Diagnosis not present

## 2022-01-01 DIAGNOSIS — M6281 Muscle weakness (generalized): Secondary | ICD-10-CM | POA: Diagnosis not present

## 2022-01-02 DIAGNOSIS — M6281 Muscle weakness (generalized): Secondary | ICD-10-CM | POA: Diagnosis not present

## 2022-01-02 DIAGNOSIS — R2689 Other abnormalities of gait and mobility: Secondary | ICD-10-CM | POA: Diagnosis not present

## 2022-01-03 DIAGNOSIS — M6281 Muscle weakness (generalized): Secondary | ICD-10-CM | POA: Diagnosis not present

## 2022-01-03 DIAGNOSIS — R490 Dysphonia: Secondary | ICD-10-CM | POA: Diagnosis not present

## 2022-01-03 DIAGNOSIS — R488 Other symbolic dysfunctions: Secondary | ICD-10-CM | POA: Diagnosis not present

## 2022-01-03 DIAGNOSIS — R2689 Other abnormalities of gait and mobility: Secondary | ICD-10-CM | POA: Diagnosis not present

## 2022-01-03 DIAGNOSIS — R41841 Cognitive communication deficit: Secondary | ICD-10-CM | POA: Diagnosis not present

## 2022-01-07 DIAGNOSIS — R490 Dysphonia: Secondary | ICD-10-CM | POA: Diagnosis not present

## 2022-01-07 DIAGNOSIS — M6281 Muscle weakness (generalized): Secondary | ICD-10-CM | POA: Diagnosis not present

## 2022-01-07 DIAGNOSIS — R41841 Cognitive communication deficit: Secondary | ICD-10-CM | POA: Diagnosis not present

## 2022-01-07 DIAGNOSIS — R2689 Other abnormalities of gait and mobility: Secondary | ICD-10-CM | POA: Diagnosis not present

## 2022-01-07 DIAGNOSIS — R488 Other symbolic dysfunctions: Secondary | ICD-10-CM | POA: Diagnosis not present

## 2022-01-08 DIAGNOSIS — R490 Dysphonia: Secondary | ICD-10-CM | POA: Diagnosis not present

## 2022-01-08 DIAGNOSIS — M6281 Muscle weakness (generalized): Secondary | ICD-10-CM | POA: Diagnosis not present

## 2022-01-08 DIAGNOSIS — R41841 Cognitive communication deficit: Secondary | ICD-10-CM | POA: Diagnosis not present

## 2022-01-08 DIAGNOSIS — R488 Other symbolic dysfunctions: Secondary | ICD-10-CM | POA: Diagnosis not present

## 2022-01-09 DIAGNOSIS — M6281 Muscle weakness (generalized): Secondary | ICD-10-CM | POA: Diagnosis not present

## 2022-01-09 DIAGNOSIS — R2689 Other abnormalities of gait and mobility: Secondary | ICD-10-CM | POA: Diagnosis not present

## 2022-01-10 DIAGNOSIS — R488 Other symbolic dysfunctions: Secondary | ICD-10-CM | POA: Diagnosis not present

## 2022-01-10 DIAGNOSIS — R41841 Cognitive communication deficit: Secondary | ICD-10-CM | POA: Diagnosis not present

## 2022-01-10 DIAGNOSIS — Z961 Presence of intraocular lens: Secondary | ICD-10-CM | POA: Diagnosis not present

## 2022-01-10 DIAGNOSIS — R490 Dysphonia: Secondary | ICD-10-CM | POA: Diagnosis not present

## 2022-01-10 DIAGNOSIS — M6281 Muscle weakness (generalized): Secondary | ICD-10-CM | POA: Diagnosis not present

## 2022-01-10 DIAGNOSIS — R2689 Other abnormalities of gait and mobility: Secondary | ICD-10-CM | POA: Diagnosis not present

## 2022-01-11 DIAGNOSIS — R41841 Cognitive communication deficit: Secondary | ICD-10-CM | POA: Diagnosis not present

## 2022-01-11 DIAGNOSIS — M6281 Muscle weakness (generalized): Secondary | ICD-10-CM | POA: Diagnosis not present

## 2022-01-11 DIAGNOSIS — R2689 Other abnormalities of gait and mobility: Secondary | ICD-10-CM | POA: Diagnosis not present

## 2022-01-11 DIAGNOSIS — R490 Dysphonia: Secondary | ICD-10-CM | POA: Diagnosis not present

## 2022-01-11 DIAGNOSIS — R488 Other symbolic dysfunctions: Secondary | ICD-10-CM | POA: Diagnosis not present

## 2022-01-14 DIAGNOSIS — R488 Other symbolic dysfunctions: Secondary | ICD-10-CM | POA: Diagnosis not present

## 2022-01-14 DIAGNOSIS — R41841 Cognitive communication deficit: Secondary | ICD-10-CM | POA: Diagnosis not present

## 2022-01-14 DIAGNOSIS — R490 Dysphonia: Secondary | ICD-10-CM | POA: Diagnosis not present

## 2022-01-14 DIAGNOSIS — M6281 Muscle weakness (generalized): Secondary | ICD-10-CM | POA: Diagnosis not present

## 2022-01-15 DIAGNOSIS — Z0100 Encounter for examination of eyes and vision without abnormal findings: Secondary | ICD-10-CM | POA: Diagnosis not present

## 2022-01-15 DIAGNOSIS — M6281 Muscle weakness (generalized): Secondary | ICD-10-CM | POA: Diagnosis not present

## 2022-01-15 DIAGNOSIS — R488 Other symbolic dysfunctions: Secondary | ICD-10-CM | POA: Diagnosis not present

## 2022-01-15 DIAGNOSIS — R41841 Cognitive communication deficit: Secondary | ICD-10-CM | POA: Diagnosis not present

## 2022-01-15 DIAGNOSIS — R2689 Other abnormalities of gait and mobility: Secondary | ICD-10-CM | POA: Diagnosis not present

## 2022-01-15 DIAGNOSIS — R490 Dysphonia: Secondary | ICD-10-CM | POA: Diagnosis not present

## 2022-01-16 DIAGNOSIS — R488 Other symbolic dysfunctions: Secondary | ICD-10-CM | POA: Diagnosis not present

## 2022-01-16 DIAGNOSIS — R490 Dysphonia: Secondary | ICD-10-CM | POA: Diagnosis not present

## 2022-01-16 DIAGNOSIS — M6281 Muscle weakness (generalized): Secondary | ICD-10-CM | POA: Diagnosis not present

## 2022-01-16 DIAGNOSIS — R41841 Cognitive communication deficit: Secondary | ICD-10-CM | POA: Diagnosis not present

## 2022-01-16 DIAGNOSIS — R2689 Other abnormalities of gait and mobility: Secondary | ICD-10-CM | POA: Diagnosis not present

## 2022-01-17 DIAGNOSIS — R2689 Other abnormalities of gait and mobility: Secondary | ICD-10-CM | POA: Diagnosis not present

## 2022-01-17 DIAGNOSIS — M6281 Muscle weakness (generalized): Secondary | ICD-10-CM | POA: Diagnosis not present

## 2022-01-18 DIAGNOSIS — R2689 Other abnormalities of gait and mobility: Secondary | ICD-10-CM | POA: Diagnosis not present

## 2022-01-18 DIAGNOSIS — M6281 Muscle weakness (generalized): Secondary | ICD-10-CM | POA: Diagnosis not present

## 2022-01-21 DIAGNOSIS — R490 Dysphonia: Secondary | ICD-10-CM | POA: Diagnosis not present

## 2022-01-21 DIAGNOSIS — R488 Other symbolic dysfunctions: Secondary | ICD-10-CM | POA: Diagnosis not present

## 2022-01-21 DIAGNOSIS — R41841 Cognitive communication deficit: Secondary | ICD-10-CM | POA: Diagnosis not present

## 2022-01-21 DIAGNOSIS — M6281 Muscle weakness (generalized): Secondary | ICD-10-CM | POA: Diagnosis not present

## 2022-01-21 DIAGNOSIS — R2689 Other abnormalities of gait and mobility: Secondary | ICD-10-CM | POA: Diagnosis not present

## 2022-01-22 DIAGNOSIS — M6281 Muscle weakness (generalized): Secondary | ICD-10-CM | POA: Diagnosis not present

## 2022-01-22 DIAGNOSIS — R2689 Other abnormalities of gait and mobility: Secondary | ICD-10-CM | POA: Diagnosis not present

## 2022-01-23 DIAGNOSIS — R2689 Other abnormalities of gait and mobility: Secondary | ICD-10-CM | POA: Diagnosis not present

## 2022-01-23 DIAGNOSIS — M6281 Muscle weakness (generalized): Secondary | ICD-10-CM | POA: Diagnosis not present

## 2022-01-23 DIAGNOSIS — R490 Dysphonia: Secondary | ICD-10-CM | POA: Diagnosis not present

## 2022-01-23 DIAGNOSIS — R41841 Cognitive communication deficit: Secondary | ICD-10-CM | POA: Diagnosis not present

## 2022-01-23 DIAGNOSIS — R488 Other symbolic dysfunctions: Secondary | ICD-10-CM | POA: Diagnosis not present

## 2022-01-24 DIAGNOSIS — R488 Other symbolic dysfunctions: Secondary | ICD-10-CM | POA: Diagnosis not present

## 2022-01-24 DIAGNOSIS — R41841 Cognitive communication deficit: Secondary | ICD-10-CM | POA: Diagnosis not present

## 2022-01-24 DIAGNOSIS — R490 Dysphonia: Secondary | ICD-10-CM | POA: Diagnosis not present

## 2022-01-25 DIAGNOSIS — R490 Dysphonia: Secondary | ICD-10-CM | POA: Diagnosis not present

## 2022-01-25 DIAGNOSIS — R488 Other symbolic dysfunctions: Secondary | ICD-10-CM | POA: Diagnosis not present

## 2022-01-25 DIAGNOSIS — R41841 Cognitive communication deficit: Secondary | ICD-10-CM | POA: Diagnosis not present

## 2022-01-25 DIAGNOSIS — M6281 Muscle weakness (generalized): Secondary | ICD-10-CM | POA: Diagnosis not present

## 2022-01-25 DIAGNOSIS — R2689 Other abnormalities of gait and mobility: Secondary | ICD-10-CM | POA: Diagnosis not present

## 2022-01-28 DIAGNOSIS — H903 Sensorineural hearing loss, bilateral: Secondary | ICD-10-CM | POA: Diagnosis not present

## 2022-01-28 DIAGNOSIS — R2689 Other abnormalities of gait and mobility: Secondary | ICD-10-CM | POA: Diagnosis not present

## 2022-01-28 DIAGNOSIS — M6281 Muscle weakness (generalized): Secondary | ICD-10-CM | POA: Diagnosis not present

## 2022-01-29 DIAGNOSIS — R488 Other symbolic dysfunctions: Secondary | ICD-10-CM | POA: Diagnosis not present

## 2022-01-29 DIAGNOSIS — R41841 Cognitive communication deficit: Secondary | ICD-10-CM | POA: Diagnosis not present

## 2022-01-29 DIAGNOSIS — M6281 Muscle weakness (generalized): Secondary | ICD-10-CM | POA: Diagnosis not present

## 2022-01-29 DIAGNOSIS — R2689 Other abnormalities of gait and mobility: Secondary | ICD-10-CM | POA: Diagnosis not present

## 2022-01-29 DIAGNOSIS — R490 Dysphonia: Secondary | ICD-10-CM | POA: Diagnosis not present

## 2022-01-30 DIAGNOSIS — R2689 Other abnormalities of gait and mobility: Secondary | ICD-10-CM | POA: Diagnosis not present

## 2022-01-30 DIAGNOSIS — M6281 Muscle weakness (generalized): Secondary | ICD-10-CM | POA: Diagnosis not present

## 2022-01-31 DIAGNOSIS — R490 Dysphonia: Secondary | ICD-10-CM | POA: Diagnosis not present

## 2022-01-31 DIAGNOSIS — R488 Other symbolic dysfunctions: Secondary | ICD-10-CM | POA: Diagnosis not present

## 2022-01-31 DIAGNOSIS — R2689 Other abnormalities of gait and mobility: Secondary | ICD-10-CM | POA: Diagnosis not present

## 2022-01-31 DIAGNOSIS — M6281 Muscle weakness (generalized): Secondary | ICD-10-CM | POA: Diagnosis not present

## 2022-01-31 DIAGNOSIS — R41841 Cognitive communication deficit: Secondary | ICD-10-CM | POA: Diagnosis not present

## 2022-02-04 DIAGNOSIS — M6281 Muscle weakness (generalized): Secondary | ICD-10-CM | POA: Diagnosis not present

## 2022-02-04 DIAGNOSIS — R2689 Other abnormalities of gait and mobility: Secondary | ICD-10-CM | POA: Diagnosis not present

## 2022-02-05 DIAGNOSIS — R490 Dysphonia: Secondary | ICD-10-CM | POA: Diagnosis not present

## 2022-02-05 DIAGNOSIS — R41841 Cognitive communication deficit: Secondary | ICD-10-CM | POA: Diagnosis not present

## 2022-02-05 DIAGNOSIS — M6281 Muscle weakness (generalized): Secondary | ICD-10-CM | POA: Diagnosis not present

## 2022-02-05 DIAGNOSIS — R488 Other symbolic dysfunctions: Secondary | ICD-10-CM | POA: Diagnosis not present

## 2022-02-05 DIAGNOSIS — R2689 Other abnormalities of gait and mobility: Secondary | ICD-10-CM | POA: Diagnosis not present

## 2022-02-06 DIAGNOSIS — M6281 Muscle weakness (generalized): Secondary | ICD-10-CM | POA: Diagnosis not present

## 2022-02-06 DIAGNOSIS — R2689 Other abnormalities of gait and mobility: Secondary | ICD-10-CM | POA: Diagnosis not present

## 2022-02-07 DIAGNOSIS — M6281 Muscle weakness (generalized): Secondary | ICD-10-CM | POA: Diagnosis not present

## 2022-02-07 DIAGNOSIS — R2689 Other abnormalities of gait and mobility: Secondary | ICD-10-CM | POA: Diagnosis not present

## 2022-02-08 DIAGNOSIS — M6281 Muscle weakness (generalized): Secondary | ICD-10-CM | POA: Diagnosis not present

## 2022-02-08 DIAGNOSIS — R41841 Cognitive communication deficit: Secondary | ICD-10-CM | POA: Diagnosis not present

## 2022-02-08 DIAGNOSIS — R488 Other symbolic dysfunctions: Secondary | ICD-10-CM | POA: Diagnosis not present

## 2022-02-08 DIAGNOSIS — R490 Dysphonia: Secondary | ICD-10-CM | POA: Diagnosis not present

## 2022-02-09 DIAGNOSIS — M6281 Muscle weakness (generalized): Secondary | ICD-10-CM | POA: Diagnosis not present

## 2022-02-12 DIAGNOSIS — R2689 Other abnormalities of gait and mobility: Secondary | ICD-10-CM | POA: Diagnosis not present

## 2022-02-12 DIAGNOSIS — M6281 Muscle weakness (generalized): Secondary | ICD-10-CM | POA: Diagnosis not present

## 2022-02-13 DIAGNOSIS — M6281 Muscle weakness (generalized): Secondary | ICD-10-CM | POA: Diagnosis not present

## 2022-02-13 DIAGNOSIS — R2689 Other abnormalities of gait and mobility: Secondary | ICD-10-CM | POA: Diagnosis not present

## 2022-02-14 DIAGNOSIS — R490 Dysphonia: Secondary | ICD-10-CM | POA: Diagnosis not present

## 2022-02-14 DIAGNOSIS — M6281 Muscle weakness (generalized): Secondary | ICD-10-CM | POA: Diagnosis not present

## 2022-02-14 DIAGNOSIS — R488 Other symbolic dysfunctions: Secondary | ICD-10-CM | POA: Diagnosis not present

## 2022-02-14 DIAGNOSIS — R2689 Other abnormalities of gait and mobility: Secondary | ICD-10-CM | POA: Diagnosis not present

## 2022-02-14 DIAGNOSIS — R41841 Cognitive communication deficit: Secondary | ICD-10-CM | POA: Diagnosis not present

## 2022-02-18 DIAGNOSIS — R2689 Other abnormalities of gait and mobility: Secondary | ICD-10-CM | POA: Diagnosis not present

## 2022-02-18 DIAGNOSIS — M6281 Muscle weakness (generalized): Secondary | ICD-10-CM | POA: Diagnosis not present

## 2022-02-19 DIAGNOSIS — M6281 Muscle weakness (generalized): Secondary | ICD-10-CM | POA: Diagnosis not present

## 2022-02-19 DIAGNOSIS — R2689 Other abnormalities of gait and mobility: Secondary | ICD-10-CM | POA: Diagnosis not present

## 2022-02-20 DIAGNOSIS — R2689 Other abnormalities of gait and mobility: Secondary | ICD-10-CM | POA: Diagnosis not present

## 2022-02-20 DIAGNOSIS — M6281 Muscle weakness (generalized): Secondary | ICD-10-CM | POA: Diagnosis not present

## 2022-02-21 DIAGNOSIS — R2689 Other abnormalities of gait and mobility: Secondary | ICD-10-CM | POA: Diagnosis not present

## 2022-02-21 DIAGNOSIS — M6281 Muscle weakness (generalized): Secondary | ICD-10-CM | POA: Diagnosis not present

## 2022-02-22 DIAGNOSIS — R2689 Other abnormalities of gait and mobility: Secondary | ICD-10-CM | POA: Diagnosis not present

## 2022-02-22 DIAGNOSIS — M6281 Muscle weakness (generalized): Secondary | ICD-10-CM | POA: Diagnosis not present

## 2022-02-25 DIAGNOSIS — M6281 Muscle weakness (generalized): Secondary | ICD-10-CM | POA: Diagnosis not present

## 2022-02-25 DIAGNOSIS — R2689 Other abnormalities of gait and mobility: Secondary | ICD-10-CM | POA: Diagnosis not present

## 2022-02-26 DIAGNOSIS — R2689 Other abnormalities of gait and mobility: Secondary | ICD-10-CM | POA: Diagnosis not present

## 2022-02-26 DIAGNOSIS — M6281 Muscle weakness (generalized): Secondary | ICD-10-CM | POA: Diagnosis not present

## 2022-02-27 DIAGNOSIS — M6281 Muscle weakness (generalized): Secondary | ICD-10-CM | POA: Diagnosis not present

## 2022-02-27 DIAGNOSIS — R2689 Other abnormalities of gait and mobility: Secondary | ICD-10-CM | POA: Diagnosis not present

## 2022-02-28 DIAGNOSIS — R2689 Other abnormalities of gait and mobility: Secondary | ICD-10-CM | POA: Diagnosis not present

## 2022-02-28 DIAGNOSIS — M6281 Muscle weakness (generalized): Secondary | ICD-10-CM | POA: Diagnosis not present

## 2022-03-01 DIAGNOSIS — M6281 Muscle weakness (generalized): Secondary | ICD-10-CM | POA: Diagnosis not present

## 2022-03-01 DIAGNOSIS — R2689 Other abnormalities of gait and mobility: Secondary | ICD-10-CM | POA: Diagnosis not present

## 2022-03-05 DIAGNOSIS — R2689 Other abnormalities of gait and mobility: Secondary | ICD-10-CM | POA: Diagnosis not present

## 2022-03-05 DIAGNOSIS — M6281 Muscle weakness (generalized): Secondary | ICD-10-CM | POA: Diagnosis not present

## 2022-03-06 DIAGNOSIS — R2689 Other abnormalities of gait and mobility: Secondary | ICD-10-CM | POA: Diagnosis not present

## 2022-03-06 DIAGNOSIS — M6281 Muscle weakness (generalized): Secondary | ICD-10-CM | POA: Diagnosis not present

## 2022-03-07 DIAGNOSIS — M6281 Muscle weakness (generalized): Secondary | ICD-10-CM | POA: Diagnosis not present

## 2022-03-07 DIAGNOSIS — R2689 Other abnormalities of gait and mobility: Secondary | ICD-10-CM | POA: Diagnosis not present

## 2022-03-12 DIAGNOSIS — R2689 Other abnormalities of gait and mobility: Secondary | ICD-10-CM | POA: Diagnosis not present

## 2022-03-12 DIAGNOSIS — M6281 Muscle weakness (generalized): Secondary | ICD-10-CM | POA: Diagnosis not present

## 2022-03-14 DIAGNOSIS — M6281 Muscle weakness (generalized): Secondary | ICD-10-CM | POA: Diagnosis not present

## 2022-03-14 DIAGNOSIS — R2689 Other abnormalities of gait and mobility: Secondary | ICD-10-CM | POA: Diagnosis not present

## 2022-03-18 DIAGNOSIS — R2689 Other abnormalities of gait and mobility: Secondary | ICD-10-CM | POA: Diagnosis not present

## 2022-03-18 DIAGNOSIS — M6281 Muscle weakness (generalized): Secondary | ICD-10-CM | POA: Diagnosis not present

## 2022-03-19 DIAGNOSIS — M6281 Muscle weakness (generalized): Secondary | ICD-10-CM | POA: Diagnosis not present

## 2022-03-19 DIAGNOSIS — R2689 Other abnormalities of gait and mobility: Secondary | ICD-10-CM | POA: Diagnosis not present

## 2022-03-20 DIAGNOSIS — M6281 Muscle weakness (generalized): Secondary | ICD-10-CM | POA: Diagnosis not present

## 2022-03-20 DIAGNOSIS — R2689 Other abnormalities of gait and mobility: Secondary | ICD-10-CM | POA: Diagnosis not present

## 2022-03-22 DIAGNOSIS — M6281 Muscle weakness (generalized): Secondary | ICD-10-CM | POA: Diagnosis not present

## 2022-03-22 DIAGNOSIS — R2689 Other abnormalities of gait and mobility: Secondary | ICD-10-CM | POA: Diagnosis not present

## 2022-03-26 DIAGNOSIS — R2689 Other abnormalities of gait and mobility: Secondary | ICD-10-CM | POA: Diagnosis not present

## 2022-03-26 DIAGNOSIS — M6281 Muscle weakness (generalized): Secondary | ICD-10-CM | POA: Diagnosis not present

## 2022-03-29 DIAGNOSIS — R2689 Other abnormalities of gait and mobility: Secondary | ICD-10-CM | POA: Diagnosis not present

## 2022-03-29 DIAGNOSIS — M6281 Muscle weakness (generalized): Secondary | ICD-10-CM | POA: Diagnosis not present

## 2022-04-02 DIAGNOSIS — M6281 Muscle weakness (generalized): Secondary | ICD-10-CM | POA: Diagnosis not present

## 2022-04-02 DIAGNOSIS — R2689 Other abnormalities of gait and mobility: Secondary | ICD-10-CM | POA: Diagnosis not present

## 2022-04-04 DIAGNOSIS — M6281 Muscle weakness (generalized): Secondary | ICD-10-CM | POA: Diagnosis not present

## 2022-04-04 DIAGNOSIS — R2689 Other abnormalities of gait and mobility: Secondary | ICD-10-CM | POA: Diagnosis not present

## 2022-04-05 DIAGNOSIS — M6281 Muscle weakness (generalized): Secondary | ICD-10-CM | POA: Diagnosis not present

## 2022-04-05 DIAGNOSIS — R2689 Other abnormalities of gait and mobility: Secondary | ICD-10-CM | POA: Diagnosis not present

## 2022-04-08 DIAGNOSIS — M6281 Muscle weakness (generalized): Secondary | ICD-10-CM | POA: Diagnosis not present

## 2022-04-08 DIAGNOSIS — R2689 Other abnormalities of gait and mobility: Secondary | ICD-10-CM | POA: Diagnosis not present

## 2022-04-12 DIAGNOSIS — R2689 Other abnormalities of gait and mobility: Secondary | ICD-10-CM | POA: Diagnosis not present

## 2022-04-12 DIAGNOSIS — M6281 Muscle weakness (generalized): Secondary | ICD-10-CM | POA: Diagnosis not present

## 2022-04-15 DIAGNOSIS — N1831 Chronic kidney disease, stage 3a: Secondary | ICD-10-CM | POA: Diagnosis not present

## 2022-04-15 DIAGNOSIS — Z79899 Other long term (current) drug therapy: Secondary | ICD-10-CM | POA: Diagnosis not present

## 2022-04-15 DIAGNOSIS — I340 Nonrheumatic mitral (valve) insufficiency: Secondary | ICD-10-CM | POA: Diagnosis not present

## 2022-04-15 DIAGNOSIS — E538 Deficiency of other specified B group vitamins: Secondary | ICD-10-CM | POA: Diagnosis not present

## 2022-04-15 DIAGNOSIS — R262 Difficulty in walking, not elsewhere classified: Secondary | ICD-10-CM | POA: Diagnosis not present

## 2022-04-15 DIAGNOSIS — M8000XS Age-related osteoporosis with current pathological fracture, unspecified site, sequela: Secondary | ICD-10-CM | POA: Diagnosis not present

## 2022-04-15 DIAGNOSIS — F411 Generalized anxiety disorder: Secondary | ICD-10-CM | POA: Diagnosis not present

## 2022-04-15 DIAGNOSIS — M3500 Sicca syndrome, unspecified: Secondary | ICD-10-CM | POA: Diagnosis not present

## 2022-04-15 DIAGNOSIS — H903 Sensorineural hearing loss, bilateral: Secondary | ICD-10-CM | POA: Diagnosis not present

## 2022-04-15 DIAGNOSIS — Z0000 Encounter for general adult medical examination without abnormal findings: Secondary | ICD-10-CM | POA: Diagnosis not present

## 2022-04-15 DIAGNOSIS — Z1389 Encounter for screening for other disorder: Secondary | ICD-10-CM | POA: Diagnosis not present

## 2022-04-16 DIAGNOSIS — R2689 Other abnormalities of gait and mobility: Secondary | ICD-10-CM | POA: Diagnosis not present

## 2022-04-16 DIAGNOSIS — M6281 Muscle weakness (generalized): Secondary | ICD-10-CM | POA: Diagnosis not present

## 2022-04-17 DIAGNOSIS — M6281 Muscle weakness (generalized): Secondary | ICD-10-CM | POA: Diagnosis not present

## 2022-04-17 DIAGNOSIS — R2689 Other abnormalities of gait and mobility: Secondary | ICD-10-CM | POA: Diagnosis not present

## 2022-04-25 DIAGNOSIS — M6281 Muscle weakness (generalized): Secondary | ICD-10-CM | POA: Diagnosis not present

## 2022-04-25 DIAGNOSIS — R2689 Other abnormalities of gait and mobility: Secondary | ICD-10-CM | POA: Diagnosis not present

## 2022-04-26 DIAGNOSIS — M6281 Muscle weakness (generalized): Secondary | ICD-10-CM | POA: Diagnosis not present

## 2022-04-26 DIAGNOSIS — R2689 Other abnormalities of gait and mobility: Secondary | ICD-10-CM | POA: Diagnosis not present

## 2022-04-29 DIAGNOSIS — M6281 Muscle weakness (generalized): Secondary | ICD-10-CM | POA: Diagnosis not present

## 2022-04-29 DIAGNOSIS — R2689 Other abnormalities of gait and mobility: Secondary | ICD-10-CM | POA: Diagnosis not present

## 2022-05-01 DIAGNOSIS — R2689 Other abnormalities of gait and mobility: Secondary | ICD-10-CM | POA: Diagnosis not present

## 2022-05-01 DIAGNOSIS — M6281 Muscle weakness (generalized): Secondary | ICD-10-CM | POA: Diagnosis not present

## 2022-05-07 DIAGNOSIS — M6281 Muscle weakness (generalized): Secondary | ICD-10-CM | POA: Diagnosis not present

## 2022-05-07 DIAGNOSIS — R2689 Other abnormalities of gait and mobility: Secondary | ICD-10-CM | POA: Diagnosis not present

## 2022-05-08 DIAGNOSIS — M6281 Muscle weakness (generalized): Secondary | ICD-10-CM | POA: Diagnosis not present

## 2022-05-08 DIAGNOSIS — R2689 Other abnormalities of gait and mobility: Secondary | ICD-10-CM | POA: Diagnosis not present

## 2022-05-15 DIAGNOSIS — R2689 Other abnormalities of gait and mobility: Secondary | ICD-10-CM | POA: Diagnosis not present

## 2022-05-15 DIAGNOSIS — M6281 Muscle weakness (generalized): Secondary | ICD-10-CM | POA: Diagnosis not present

## 2022-05-16 DIAGNOSIS — R2689 Other abnormalities of gait and mobility: Secondary | ICD-10-CM | POA: Diagnosis not present

## 2022-05-16 DIAGNOSIS — M6281 Muscle weakness (generalized): Secondary | ICD-10-CM | POA: Diagnosis not present

## 2022-05-17 DIAGNOSIS — M6281 Muscle weakness (generalized): Secondary | ICD-10-CM | POA: Diagnosis not present

## 2022-05-17 DIAGNOSIS — R2689 Other abnormalities of gait and mobility: Secondary | ICD-10-CM | POA: Diagnosis not present

## 2022-05-31 DIAGNOSIS — M6281 Muscle weakness (generalized): Secondary | ICD-10-CM | POA: Diagnosis not present

## 2022-06-12 ENCOUNTER — Other Ambulatory Visit: Payer: Self-pay

## 2022-06-12 ENCOUNTER — Encounter (HOSPITAL_BASED_OUTPATIENT_CLINIC_OR_DEPARTMENT_OTHER): Payer: Self-pay

## 2022-06-12 ENCOUNTER — Inpatient Hospital Stay (HOSPITAL_BASED_OUTPATIENT_CLINIC_OR_DEPARTMENT_OTHER)
Admission: EM | Admit: 2022-06-12 | Discharge: 2022-06-25 | DRG: 478 | Disposition: A | Discharge status: Skilled Nursing Facility | Payer: Medicare HMO | Source: Skilled Nursing Facility | Attending: Family Medicine | Admitting: Family Medicine

## 2022-06-12 ENCOUNTER — Emergency Department (HOSPITAL_BASED_OUTPATIENT_CLINIC_OR_DEPARTMENT_OTHER): Payer: Medicare HMO

## 2022-06-12 ENCOUNTER — Emergency Department (HOSPITAL_BASED_OUTPATIENT_CLINIC_OR_DEPARTMENT_OTHER): Payer: Medicare HMO | Admitting: Radiology

## 2022-06-12 ENCOUNTER — Encounter (HOSPITAL_COMMUNITY): Payer: Self-pay

## 2022-06-12 DIAGNOSIS — E860 Dehydration: Secondary | ICD-10-CM | POA: Diagnosis present

## 2022-06-12 DIAGNOSIS — F419 Anxiety disorder, unspecified: Secondary | ICD-10-CM | POA: Diagnosis not present

## 2022-06-12 DIAGNOSIS — M48061 Spinal stenosis, lumbar region without neurogenic claudication: Secondary | ICD-10-CM | POA: Diagnosis not present

## 2022-06-12 DIAGNOSIS — M545 Low back pain, unspecified: Secondary | ICD-10-CM | POA: Diagnosis not present

## 2022-06-12 DIAGNOSIS — S32000A Wedge compression fracture of unspecified lumbar vertebra, initial encounter for closed fracture: Secondary | ICD-10-CM | POA: Diagnosis present

## 2022-06-12 DIAGNOSIS — N182 Chronic kidney disease, stage 2 (mild): Secondary | ICD-10-CM | POA: Diagnosis not present

## 2022-06-12 DIAGNOSIS — M47816 Spondylosis without myelopathy or radiculopathy, lumbar region: Secondary | ICD-10-CM | POA: Diagnosis present

## 2022-06-12 DIAGNOSIS — I129 Hypertensive chronic kidney disease with stage 1 through stage 4 chronic kidney disease, or unspecified chronic kidney disease: Secondary | ICD-10-CM | POA: Diagnosis present

## 2022-06-12 DIAGNOSIS — Z888 Allergy status to other drugs, medicaments and biological substances status: Secondary | ICD-10-CM

## 2022-06-12 DIAGNOSIS — K219 Gastro-esophageal reflux disease without esophagitis: Secondary | ICD-10-CM | POA: Diagnosis present

## 2022-06-12 DIAGNOSIS — D696 Thrombocytopenia, unspecified: Secondary | ICD-10-CM | POA: Diagnosis present

## 2022-06-12 DIAGNOSIS — E871 Hypo-osmolality and hyponatremia: Secondary | ICD-10-CM | POA: Diagnosis not present

## 2022-06-12 DIAGNOSIS — E785 Hyperlipidemia, unspecified: Secondary | ICD-10-CM | POA: Diagnosis present

## 2022-06-12 DIAGNOSIS — R9431 Abnormal electrocardiogram [ECG] [EKG]: Secondary | ICD-10-CM | POA: Diagnosis present

## 2022-06-12 DIAGNOSIS — Z1152 Encounter for screening for COVID-19: Secondary | ICD-10-CM | POA: Diagnosis not present

## 2022-06-12 DIAGNOSIS — Z885 Allergy status to narcotic agent status: Secondary | ICD-10-CM | POA: Diagnosis not present

## 2022-06-12 DIAGNOSIS — M4126 Other idiopathic scoliosis, lumbar region: Secondary | ICD-10-CM | POA: Diagnosis not present

## 2022-06-12 DIAGNOSIS — G8929 Other chronic pain: Secondary | ICD-10-CM | POA: Diagnosis present

## 2022-06-12 DIAGNOSIS — Z66 Do not resuscitate: Secondary | ICD-10-CM | POA: Diagnosis not present

## 2022-06-12 DIAGNOSIS — R0902 Hypoxemia: Secondary | ICD-10-CM | POA: Diagnosis not present

## 2022-06-12 DIAGNOSIS — Z79899 Other long term (current) drug therapy: Secondary | ICD-10-CM

## 2022-06-12 DIAGNOSIS — S32020A Wedge compression fracture of second lumbar vertebra, initial encounter for closed fracture: Secondary | ICD-10-CM | POA: Diagnosis not present

## 2022-06-12 DIAGNOSIS — M3500 Sicca syndrome, unspecified: Secondary | ICD-10-CM | POA: Diagnosis present

## 2022-06-12 DIAGNOSIS — M4856XA Collapsed vertebra, not elsewhere classified, lumbar region, initial encounter for fracture: Secondary | ICD-10-CM | POA: Diagnosis not present

## 2022-06-12 DIAGNOSIS — S32040A Wedge compression fracture of fourth lumbar vertebra, initial encounter for closed fracture: Secondary | ICD-10-CM | POA: Diagnosis not present

## 2022-06-12 DIAGNOSIS — M549 Dorsalgia, unspecified: Secondary | ICD-10-CM | POA: Diagnosis not present

## 2022-06-12 DIAGNOSIS — J439 Emphysema, unspecified: Secondary | ICD-10-CM | POA: Diagnosis not present

## 2022-06-12 DIAGNOSIS — J90 Pleural effusion, not elsewhere classified: Secondary | ICD-10-CM | POA: Diagnosis not present

## 2022-06-12 HISTORY — DX: Wedge compression fracture of unspecified lumbar vertebra, initial encounter for closed fracture: S32.000A

## 2022-06-12 LAB — URINALYSIS, ROUTINE W REFLEX MICROSCOPIC
Bilirubin Urine: NEGATIVE
Glucose, UA: NEGATIVE mg/dL
Hgb urine dipstick: NEGATIVE
Ketones, ur: NEGATIVE mg/dL
Leukocytes,Ua: NEGATIVE
Nitrite: NEGATIVE
Protein, ur: NEGATIVE mg/dL
Specific Gravity, Urine: 1.01 (ref 1.005–1.030)
pH: 7 (ref 5.0–8.0)

## 2022-06-12 LAB — RESP PANEL BY RT-PCR (RSV, FLU A&B, COVID)  RVPGX2
Influenza A by PCR: NEGATIVE
Influenza B by PCR: NEGATIVE
Resp Syncytial Virus by PCR: NEGATIVE
SARS Coronavirus 2 by RT PCR: NEGATIVE

## 2022-06-12 LAB — CBC WITH DIFFERENTIAL/PLATELET
Abs Immature Granulocytes: 0.03 10*3/uL (ref 0.00–0.07)
Basophils Absolute: 0 10*3/uL (ref 0.0–0.1)
Basophils Relative: 0 %
Eosinophils Absolute: 0 10*3/uL (ref 0.0–0.5)
Eosinophils Relative: 0 %
HCT: 38.4 % (ref 36.0–46.0)
Hemoglobin: 13.2 g/dL (ref 12.0–15.0)
Immature Granulocytes: 0 %
Lymphocytes Relative: 6 %
Lymphs Abs: 0.6 10*3/uL — ABNORMAL LOW (ref 0.7–4.0)
MCH: 34.8 pg — ABNORMAL HIGH (ref 26.0–34.0)
MCHC: 34.4 g/dL (ref 30.0–36.0)
MCV: 101.3 fL — ABNORMAL HIGH (ref 80.0–100.0)
Monocytes Absolute: 0.9 10*3/uL (ref 0.1–1.0)
Monocytes Relative: 10 %
Neutro Abs: 8.1 10*3/uL — ABNORMAL HIGH (ref 1.7–7.7)
Neutrophils Relative %: 84 %
Platelets: 142 10*3/uL — ABNORMAL LOW (ref 150–400)
RBC: 3.79 MIL/uL — ABNORMAL LOW (ref 3.87–5.11)
RDW: 13.2 % (ref 11.5–15.5)
WBC: 9.7 10*3/uL (ref 4.0–10.5)
nRBC: 0 % (ref 0.0–0.2)

## 2022-06-12 LAB — BRAIN NATRIURETIC PEPTIDE: B Natriuretic Peptide: 343 pg/mL — ABNORMAL HIGH (ref 0.0–100.0)

## 2022-06-12 LAB — BASIC METABOLIC PANEL
Anion gap: 8 (ref 5–15)
BUN: 17 mg/dL (ref 8–23)
CO2: 27 mmol/L (ref 22–32)
Calcium: 9.5 mg/dL (ref 8.9–10.3)
Chloride: 99 mmol/L (ref 98–111)
Creatinine, Ser: 0.73 mg/dL (ref 0.44–1.00)
GFR, Estimated: >60 mL/min (ref >60)
Glucose, Bld: 103 mg/dL — ABNORMAL HIGH (ref 70–99)
Potassium: 4 mmol/L (ref 3.5–5.1)
Sodium: 134 mmol/L — ABNORMAL LOW (ref 135–145)

## 2022-06-12 MED ORDER — OXYCODONE HCL 5 MG PO TABS
2.5000 mg | ORAL_TABLET | Freq: Once | ORAL | Status: AC
Start: 2022-06-12 — End: 2022-06-12
  Administered 2022-06-12: 2.5 mg via ORAL
  Filled 2022-06-12: qty 1

## 2022-06-12 MED ORDER — ACETAMINOPHEN 500 MG PO TABS
1000.0000 mg | ORAL_TABLET | Freq: Once | ORAL | Status: AC
  Administered 2022-06-12: 1000 mg via ORAL
  Filled 2022-06-12: qty 2

## 2022-06-12 NOTE — ED Notes (Signed)
Sheliah Hatch Paged [phone] for TSO Brace.-ABB(NS)

## 2022-06-12 NOTE — ED Notes (Signed)
Pt awake and alert - HOH.  Pt lying supine in bed at this time.  RR even and unlabored on 2L O2 via [place] with symmetrical rise and fall of chest.  Continuous cardiac and pulse ox maintained.  TLSO brace pending.  Skin dry and intact with urinary incontinence reported by patient wearing an adult brief.  Pericare subsequently performed with peri spray by this nurse with assistance of NT [facility] followed by placement of purewick.  Will monitor for acute changes and maintain plan of care.

## 2022-06-12 NOTE — ED Provider Notes (Signed)
[place] EMERGENCY DEPARTMENT AT [facility] Provider Note   CSN: [id number] Arrival date & time: 06/12/22  1352     History  Chief Complaint  Patient presents with   Back Pain    Lauren Davidson is a 89 y.o. female with lumbar spinal stenosis, HTN, HLD, h/0 L4 compression fracture, GERD, osteoporosis, sjogren's syndrome, CKD stage 2, s/p lens implant who presents with back pain.  Per chart review patient was admitted from 09/17/2021 to 09/20/2021 for acute on chronic lower back pain found to have a compression fracture of L4 and severe multifactorial spinal bilateral stenosis, was admitted for severe pain and discharged to a skilled nursing facility.  She is currently living at [facility] independent living center.  She walks with a rollator at baseline.  Patient yesterday she turned her body while sitting causing severe pain in her lower back.  States that she did not have any falls or new trauma.  Denies any radicular pain, changes in bowel or bladder (she states she is intermittently incontinent of bowel/bladder at baseline, and this is unchanged). Cannot pinpoint an exact location of the pain but states it is diffuse throughout her lower back. Denies h/o malignancy, f/c, saddle anesthesia, new numbness/tingling or LE weakness.   With EMS patient was noted to be SpO2 88-91%, placed on 2L [place]. Patient denies CP, SOB, cough, recent illnesses, f/c, urinary symptoms.    Back Pain      Home Medications Prior to Admission medications   Medication Sig Start Date End Date Taking? Authorizing Provider  acetaminophen (TYLENOL) 500 MG tablet Take 500 mg by mouth every 6 (six) hours as needed (for pain).    [provider]  Calcium Carb-Cholecalciferol (CALCIUM+D3 PO) Take 1 tablet by mouth daily with breakfast.    [provider]  docusate sodium (COLACE) 100 MG capsule Take 1 capsule (100 mg total) by mouth 2 (two) times daily. 09/20/21   Jerald Kief, MD   fish oil-omega-3 fatty acids 1000 MG capsule Take 1,000 mg by mouth 2 (two) times daily after a meal.    [provider]  lidocaine (LIDODERM) 5 % Place 1 patch onto the skin daily. Remove & Discard patch within 12 hours or as directed by MD Patient not taking: Reported on 09/17/2021 11/03/18   Virgina Norfolk, DO  meloxicam (MOBIC) 15 MG tablet Take 15 mg by mouth daily.    [provider]  Multiple Vitamins-Minerals (MULTIVITAMIN WITH MINERALS) tablet Take 1 tablet by mouth daily with lunch.    [provider]  Multiple Vitamins-Minerals (PRESERVISION AREDS 2) CAPS Take 1 capsule by mouth in the morning and at bedtime.    [provider]  oxyCODONE (OXY IR/ROXICODONE) 5 MG immediate release tablet Take 1 tablet (5 mg total) by mouth every 4 (four) hours as needed for severe pain. 09/20/21   Jerald Kief, MD  SYSTANE OVERNIGHT THERAPY 0.3 % GEL ophthalmic ointment Place 1 application. into both eyes at bedtime.    [provider]  SYSTANE ULTRA PF 0.4-0.3 % SOLN Place 1 drop into both eyes 4 (four) times daily.    [provider]      Allergies    Statins, Buprenorphine hcl, Buspirone, Lexapro [escitalopram], Lovastatin, Meperidine, and Morphine and related    Review of Systems   Review of Systems  Musculoskeletal:  Positive for back pain.   Review of systems Negative for f/c.  A 10 point review of systems was performed and is  negative unless otherwise reported in HPI.  Physical Exam Updated Vital Signs BP (!) 122/52 (BP Location: Right Arm)   Pulse 70   Temp 98 F (36.7 C) (Oral)   Resp 16   Ht 5\' 4"  (1.626 m)   Wt 63.5 kg   SpO2 99%   BMI 24.03 kg/m  Physical Exam General: Normal appearing female, lying in bed.  HEENT: PERRLA, EOMI, Sclera anicteric, MMM, trachea midline.  Cardiology: RRR, no murmurs/rubs/gallops. BL radial and DP pulses equal bilaterally.  Resp: Normal respiratory rate and effort. CTAB, no wheezes, rhonchi,  crackles. On 2L [place].  Abd: Soft, non-tender, non-distended. No rebound tenderness or guarding.  GU: Deferred. MSK: No peripheral edema or signs of trauma. Extremities without deformity or TTP. No cyanosis or clubbing. Skin: warm, dry. No rashes or lesions. Back: Diffuse TTP throughout lumbar spine and paraspinal muscles. No significant point tenderness to palpation. No e/o trauma.  Neuro: A&Ox4, CNs II-XII grossly intact. 5/5 strength in all four extremities. Sensation grossly intact.  Psych: Normal mood and affect.   ED Results / Procedures / Treatments   Labs (all labs ordered are listed, but only abnormal results are displayed) Labs Reviewed  CBC WITH DIFFERENTIAL/PLATELET - Abnormal; Notable for the following components:      Result Value   RBC 3.79 (*)    MCV 101.3 (*)    MCH 34.8 (*)    Platelets 142 (*)    Neutro Abs 8.1 (*)    Lymphs Abs 0.6 (*)    All other components within normal limits  BASIC METABOLIC PANEL - Abnormal; Notable for the following components:   Sodium 134 (*)    Glucose, Bld 103 (*)    All other components within normal limits  BRAIN NATRIURETIC PEPTIDE - Abnormal; Notable for the following components:   B Natriuretic Peptide 343.0 (*)    All other components within normal limits  RESP PANEL BY RT-PCR (RSV, FLU A&B, COVID)  [id number]  URINALYSIS, ROUTINE W REFLEX MICROSCOPIC    EKG EKG Interpretation  Date/Time:  Wednesday June 12 2022 16:20:11 EST Ventricular Rate:  73 PR Interval:  207 QRS Duration: 144 QT Interval:  458 QTC Calculation: 505 R Axis:   72 Text Interpretation: Sinus rhythm Sinus rhythm with first degree AV block Consider left atrial enlargement IVCD, consider atypical RBBB Confirmed by 03-08-2003 [phone]) on 06/12/2022 4:45:46 PM  Radiology CT Lumbar Spine Wo Contrast  Result Date: 06/12/2022 CLINICAL DATA:  Trauma EXAM: CT LUMBAR SPINE WITHOUT CONTRAST TECHNIQUE: Multidetector CT imaging of the lumbar spine was performed  without intravenous contrast administration. Multiplanar CT image reconstructions were also generated. RADIATION DOSE REDUCTION: This exam was performed according to the departmental dose-optimization program which includes automated exposure control, adjustment of the mA and/or kV according to patient size and/or use of iterative reconstruction technique. COMPARISON:  Same day lumbar spine radiograph, lumbar spine CT 09/17/2021 FINDINGS: Segmentation: 5 lumbar type vertebrae. Alignment: Normal. Vertebrae: Redemonstrated is a severe chronic deformity at L4 with progressive height loss along the anterior aspect of the vertebral body compared to 09/17/2021. compared to prior exam there is a new superior endplate compression deformity at the L2 vertebral body level without significant height loss. Paraspinal and other soft tissues: Aortic atherosclerotic calcification. Disc levels: There is no significant change from prior exam with persistent moderate spinal canal stenosis at L1-L2, severe spinal canal stenosis at L2-L5. there is also severe bilateral neural foraminal stenosis at L3-L4 and L4-L5. IMPRESSION: 1. New superior  endplate compression deformity at the L2 vertebral body level without significant height loss. 2. Severe chronic deformity at L4 with progressive height loss along the anterior aspect of the vertebral body compared to 09/17/2021. 3. Unchanged severe spinal canal stenosis at L2-L5 and severe bilateral neural foraminal stenosis at L3-L4 and L4-L5. Aortic Atherosclerosis (ICD10-I70.0). Electronically Signed   By: Marin Roberts M.D.   On: 06/12/2022 17:41   DG Lumbar Spine Complete  Result Date: 06/12/2022 CLINICAL DATA:  Lower back pain EXAM: LUMBAR SPINE - COMPLETE 4+ VIEW COMPARISON:  12/05/2021 FINDINGS: Osseous demineralization. Five non-rib-bearing lumbar vertebra. Marked compression deformity of L4 vertebral body similar to previous study. Mild superior endplate compression deformity of L2  with minimal height loss, new. No additional fracture, subluxation, or bone destruction. Multilevel facet degenerative changes. Minimal levoconvex lumbar scoliosis. SI joints preserved. Atherosclerotic calcifications aorta. IMPRESSION: Chronic L4 compression fracture with minimal superior endplate compression deformity of L2 vertebral body new since 12/05/2021. Aortic Atherosclerosis (ICD10-I70.0). Electronically Signed   By: Lavonia Dana M.D.   On: 06/12/2022 16:13   DG Chest 2 View  Result Date: 06/12/2022 CLINICAL DATA:  Hypoxia, new oxygen requirement EXAM: CHEST - 2 VIEW COMPARISON:  11/03/2018 FINDINGS: Enlargement of cardiac silhouette. Mediastinal contours and pulmonary vascularity normal. Atherosclerotic calcification aorta. Emphysematous changes with chronic accentuation of interstitial markings unchanged. Small bibasilar pleural effusions. No acute infiltrate or pneumothorax. Bones demineralized with slightly increased compression fracture of a midthoracic vertebra. IMPRESSION: COPD changes with small bibasilar pleural effusions increased versus previous study. Chronic accentuation of interstitial markings without acute infiltrate. Slightly increased compression fracture of a midthoracic vertebra. Aortic Atherosclerosis (ICD10-I70.0) and Emphysema (ICD10-J43.9). Electronically Signed   By: Lavonia Dana M.D.   On: 06/12/2022 16:11    Procedures Procedures    Medications Ordered in ED Medications  acetaminophen (TYLENOL) tablet 1,000 mg (1,000 mg Oral Given 06/12/22 1542)  oxyCODONE (Oxy IR/ROXICODONE) immediate release tablet 2.5 mg (2.5 mg Oral Given 06/12/22 1541)    ED Course/ Medical Decision Making/ A&P                          Medical Decision Making Amount and/or Complexity of Data Reviewed Labs: ordered. Decision-making details documented in ED Course. Radiology: ordered. Decision-making details documented in ED Course.  Risk OTC drugs. Prescription drug management. Decision  regarding hospitalization.    This patient presents to the ED for concern of lower back pain and new O2 requirement, this involves an extensive number of treatment options, and is a complaint that carries with it a high risk of complications and morbidity.  I considered the following differential and admission for this acute  threatening condition.   MDM:    For patient's acute back pain, differential diagnoses includes compression fracture, disc herniation given twisting motion reported at onset, lumbago versus musculoskeletal spasm / strain.  Patient has no radicular pain consistent with sciatica.  Per chart review patient does have a history of a similar sort of atraumatic lower back pain that was ultimately found to have a an L4 compression fracture.  Will obtain imaging including x-ray now to evaluate for fracture.  Presentation not consistent with malignancy (lack of history of malignancy, lack of B symptoms).no symptoms consistent with cauda equina (no bowel or urinary incontinence/retention, no saddle anesthesia, no distal weakness).  Patient has no urinary symptoms no fevers chills reported though given her diffuse tenderness in the back and also in bilateral paraspinal regions will consider  pyelonephritis/UTI and obtain a urine.  For patient's new oxygen requirement, consider possible pneumonia though she reports no cough or fever/chills, pulmonary edema though she has no history of heart failure and there is no significant lower extremity edema, pleural effusion, COVID/flu/RSV or other upper respiratory infection/bronchitis, PTX or PE though no chest pain or lower extremity edema.  Do not believe PE is the most likely explanation.  Patient is lumbar x-ray with possible new L2 compression fracture.  Will obtain CT L-spine to evaluate further.  Clinical Course as of 06/12/22 2336  Wed Jun 12, 2022  1606 Maintained 88% on RA. 93-95% on 2L [place]. Patient has no h/o oxygen requirement. [HN]  1606  WBC: 9.7 No leukocytosis [HN]  1607 Hemoglobin: 13.2 No anemia [HN]  1607 Platelets(!): 142 Has had values of 150-250 in the past, no bleeding currently  [HN]  1644 Resp panel by RT-PCR (RSV, Flu A&B, Covid) Anterior Nasal Swab Neg [HN]  1644 B Natriuretic Peptide(!): 343.0 [HN]  1645 DG Lumbar Spine Complete Chronic L4 compression fracture with minimal superior endplate compression deformity of L2 vertebral body new since 12/05/2021.  Aortic Atherosclerosis (ICD10-I70.0).   [HN]  1745 CT Lumbar Spine Wo Contrast 1. New superior endplate compression deformity at the L2 vertebral body level without significant height loss. 2. Severe chronic deformity at L4 with progressive height loss along the anterior aspect of the vertebral body compared to 09/17/2021. 3. Unchanged severe spinal canal stenosis at L2-L5 and severe bilateral neural foraminal stenosis at L3-L4 and L4-L5.  Aortic Atherosclerosis (ICD10-I70.0).   [HN]  1808 NSGY recommending TLSO when out of bed for comfort.  [HN]  1809 Consulted to hospitalist for admission  [HN]  2051 Repaging hospitalist, awaiting TLSO [HN]  2225 Repaging hospitalist [HN]  2335 Patient has been off O2 for a lot of the evening but seems to be intermittently requiring 2L [place]. Urine without e/o infection. Patient is admitted to hospitalist. [HN]    Clinical Course User Index [HN] Loetta Rough, MD    Labs: I Ordered, and personally interpreted labs.  The pertinent results include: those listed above   Imaging Studies ordered: I ordered imaging studies including CXR, lumbar spine XR I independently visualized and interpreted imaging. I agree with the radiologist interpretation  Additional history obtained from chart review, daughter at bedside.    Cardiac Monitoring: The patient was maintained on a cardiac monitor.  I personally viewed and interpreted the cardiac monitored which showed an underlying rhythm of: NSR  Reevaluation: After  the interventions noted above, I reevaluated the patient and found that they have :stayed the same  Social Determinants of Health: Patient lives in independent living   Disposition: Patient found to have an L2 compression fracture acute.  Given patient's age, I believe she is most appropriate for admission.  She is unable to get around at her independent living facility and will not be able to take care of herself.  She initially had an oxygen requirement, seems to now maintaining okay on room air, workup for which was reassuring though she has a new BNP elevation at 343 with no history of heart failure.  Patient will likely benefit from an echo in this admission as well.  Patient is admitted to hospitalist for continued management.    Co morbidities that complicate the patient evaluation  Past Medical History:  Diagnosis Date   Hyperlipidemia      Medicines Meds ordered this encounter  Medications   acetaminophen (TYLENOL) tablet 1,000  mg   oxyCODONE (Oxy IR/ROXICODONE) immediate release tablet 2.5 mg    I have reviewed the patients home medicines and have made adjustments as needed  Problem List / ED Course: Problem List Items Addressed This Visit   None Visit Diagnoses     Closed compression fracture of L2 lumbar vertebra, initial encounter [facility])    -  Primary                   This note was created using dictation software, which may contain spelling or grammatical errors.    Audley Hose, MD 06/12/22 [phone]

## 2022-06-12 NOTE — ED Notes (Signed)
Patient de sating to 88%  Placed on o2 at 2 L

## 2022-06-12 NOTE — Progress Notes (Signed)
Hospitalist Transfer Note:  Transferring facility: [facility] Requesting provider: Dr. Mayra Neer (EDP at [facility]) Reason for transfer: admission for further evaluation and management of acute atraumatic L2 compression fracture.   89  year old  female with history notable for atraumatic L4 compression fracture in May 2023, who presented to [facility] ED complaining of new onset low back pain, without any report of associated red flag symptoms or new onset weakness involving the lower extremities.  No recent trauma or fall.    She reportedly had similar atraumatic presentation May 2023 prompting hospitalization at that time, at which time she was diagnosed with L4 compression fracture for which she underwent conservative management.    She lives independently at home, but notes increasing difficulty performing ADLs over the last few days in the context of progression of acute low back pain.  Overall, she conveys that this evening's presentation is very similar to that which she was experiencing at the time of the May 2023 hospitalization for L4 compression fracture.  Vital signs in the ED were reportedly stable, including normotensive blood pressure.  Imaging notable for CT of the lumbar spine, conveying the aforementioned L2 compression fracture.  Additionally, chest x-ray reportedly showed no evidence of acute cardiopulmonary process.  EDP discussed patient's case/imaging with the on-call neurosurgeon, Dr. Marcello Moores, who recommended conservative management.    Subsequently, I accepted this patient for transfer for observation to a med-surg bed at  [facility] for further work-up and management of new diagnosis of L2 compression fracture.      Check [email or web address] for on-call coverage.   Nursing staff, Please call [place] number on Amion as soon as patient's arrival, so appropriate admitting provider can evaluate the pt.     Babs Bertin, DO Hospitalist

## 2022-06-12 NOTE — ED Triage Notes (Signed)
Patient BIB [facility] from [facility] at [place].  Endorses Lower Back Pain that began today. Per EMS, the patient turned her Body causing her Pain 1-2 Days ago while in sitting position.   VSS with EMS but Mild Hypoxia at 88-91%. Placed on [facility] and responded well.   NAD Noted during Triage. A&Ox4. GCS 15. BIB Stretcher and Transferred.

## 2022-06-13 ENCOUNTER — Observation Stay (HOSPITAL_BASED_OUTPATIENT_CLINIC_OR_DEPARTMENT_OTHER): Payer: Medicare HMO

## 2022-06-13 ENCOUNTER — Observation Stay (HOSPITAL_COMMUNITY): Payer: Medicare HMO

## 2022-06-13 DIAGNOSIS — I509 Heart failure, unspecified: Secondary | ICD-10-CM | POA: Diagnosis not present

## 2022-06-13 DIAGNOSIS — S32020A Wedge compression fracture of second lumbar vertebra, initial encounter for closed fracture: Secondary | ICD-10-CM | POA: Diagnosis not present

## 2022-06-13 DIAGNOSIS — S32040A Wedge compression fracture of fourth lumbar vertebra, initial encounter for closed fracture: Secondary | ICD-10-CM | POA: Diagnosis not present

## 2022-06-13 LAB — CBC
HCT: 36.9 % (ref 36.0–46.0)
Hemoglobin: 12.8 g/dL (ref 12.0–15.0)
MCH: 35.2 pg — ABNORMAL HIGH (ref 26.0–34.0)
MCHC: 34.7 g/dL (ref 30.0–36.0)
MCV: 101.4 fL — ABNORMAL HIGH (ref 80.0–100.0)
Platelets: 133 10*3/uL — ABNORMAL LOW (ref 150–400)
RBC: 3.64 MIL/uL — ABNORMAL LOW (ref 3.87–5.11)
RDW: 13.1 % (ref 11.5–15.5)
WBC: 8.3 10*3/uL (ref 4.0–10.5)
nRBC: 0 % (ref 0.0–0.2)

## 2022-06-13 LAB — COMPREHENSIVE METABOLIC PANEL
ALT: 23 U/L (ref 0–44)
AST: 38 U/L (ref 15–41)
Albumin: 3.1 g/dL — ABNORMAL LOW (ref 3.5–5.0)
Alkaline Phosphatase: 68 U/L (ref 38–126)
Anion gap: 9 (ref 5–15)
BUN: 13 mg/dL (ref 8–23)
CO2: 25 mmol/L (ref 22–32)
Calcium: 8.5 mg/dL — ABNORMAL LOW (ref 8.9–10.3)
Chloride: 100 mmol/L (ref 98–111)
Creatinine, Ser: 0.76 mg/dL (ref 0.44–1.00)
GFR, Estimated: >60 mL/min (ref >60)
Glucose, Bld: 82 mg/dL (ref 70–99)
Potassium: 4 mmol/L (ref 3.5–5.1)
Sodium: 134 mmol/L — ABNORMAL LOW (ref 135–145)
Total Bilirubin: 1.5 mg/dL — ABNORMAL HIGH (ref 0.3–1.2)
Total Protein: 6.2 g/dL — ABNORMAL LOW (ref 6.5–8.1)

## 2022-06-13 LAB — ECHOCARDIOGRAM COMPLETE
AR max vel: 2.76 cm2
AV Area VTI: 2.9 cm2
AV Area mean vel: 2.6 cm2
AV Mean grad: 6.5 mmHg
AV Peak grad: 12 mmHg
Ao pk vel: 1.73 m/s
Area-P 1/2: 2.42 cm2
Height: 64 in
MV M vel: 3.9 m/s
MV Peak grad: 60.8 mmHg
MV VTI: 1.67 cm2
Radius: 0.8 cm
S' Lateral: 2.4 cm
Weight: 2239.87 oz

## 2022-06-13 LAB — MAGNESIUM: Magnesium: 1.9 mg/dL (ref 1.7–2.4)

## 2022-06-13 LAB — PHOSPHORUS: Phosphorus: 2.7 mg/dL (ref 2.5–4.6)

## 2022-06-13 MED ORDER — ENOXAPARIN SODIUM 40 MG/0.4ML IJ SOSY
40.0000 mg | PREFILLED_SYRINGE | Freq: Every day | INTRAMUSCULAR | Status: DC
  Administered 2022-06-13 – 2022-06-18 (×6): 40 mg via SUBCUTANEOUS
  Filled 2022-06-13 (×7): qty 0.4

## 2022-06-13 MED ORDER — PROCHLORPERAZINE EDISYLATE 10 MG/2ML IJ SOLN: 5.0000 mg | Freq: Four times a day (QID) | INTRAMUSCULAR | Status: DC | PRN

## 2022-06-13 MED ORDER — ACETAMINOPHEN 325 MG PO TABS
650.0000 mg | ORAL_TABLET | Freq: Four times a day (QID) | ORAL | Status: DC | PRN
  Administered 2022-06-13 – 2022-06-23 (×4): 650 mg via ORAL
  Filled 2022-06-13 (×4): qty 2

## 2022-06-13 MED ORDER — FENTANYL CITRATE PF 50 MCG/ML IJ SOSY
50.0000 ug | PREFILLED_SYRINGE | Freq: Once | INTRAMUSCULAR | Status: AC
  Administered 2022-06-13: 50 ug via INTRAVENOUS
  Filled 2022-06-13: qty 1

## 2022-06-13 MED ORDER — POLYETHYLENE GLYCOL 3350 17 G PO PACK: 17.0000 g | PACK | Freq: Every day | ORAL | Status: DC | PRN

## 2022-06-13 MED ORDER — LIDOCAINE 5 % EX PTCH
1.0000 | MEDICATED_PATCH | CUTANEOUS | Status: DC
  Administered 2022-06-13 – 2022-06-25 (×11): 1 via TRANSDERMAL
  Filled 2022-06-13 (×16): qty 1

## 2022-06-13 MED ORDER — SENNOSIDES-DOCUSATE SODIUM 8.6-50 MG PO TABS
1.0000 | ORAL_TABLET | Freq: Every day | ORAL | Status: DC
  Administered 2022-06-14 – 2022-06-24 (×7): 1 via ORAL
  Filled 2022-06-13 (×10): qty 1

## 2022-06-13 MED ORDER — MELATONIN 3 MG PO TABS: 3.0000 mg | ORAL_TABLET | Freq: Every evening | ORAL | Status: DC | PRN

## 2022-06-13 MED ORDER — ADULT MULTIVITAMIN W/MINERALS CH
1.0000 | ORAL_TABLET | Freq: Every day | ORAL | Status: DC
  Administered 2022-06-13 – 2022-06-24 (×10): 1 via ORAL
  Filled 2022-06-13 (×11): qty 1

## 2022-06-13 MED ORDER — OXYCODONE HCL 5 MG PO TABS
5.0000 mg | ORAL_TABLET | Freq: Four times a day (QID) | ORAL | Status: AC | PRN
  Administered 2022-06-14 – 2022-06-17 (×4): 5 mg via ORAL
  Filled 2022-06-13 (×5): qty 1

## 2022-06-13 NOTE — ED Notes (Signed)
Pt lying awake in bed; resting comfortably at this time.  Denies any complaints.  RR even and unlabored on 2L O2 via [place] -- continuous cardiac and pulse ox maintained.   [facility] enroute- pt updated.

## 2022-06-13 NOTE — TOC Initial Note (Signed)
Transition of Care [facility]) - Initial/Assessment Note    Patient Details  Name: Lauren Davidson MRN: [id number] Date of Birth: [birth year]  Transition of [facility]) CM/SW Contact:    Sharin Mons, RN Phone Number: 06/13/2022, 3:53 PM  Clinical Narrative:  Presents after recent fall, c/o LBP/ L2 compression fracture . NCM @ bedside to speak with pt about d/c planning, daughter and son in law in room. Pt gave verbal consent for NCM  to speak with daughter and son in law regarding hospitalization and TOC needs.             Pt resides alone @ [place]. Pt is HOH. PTA independent with ADL's. Owns Rolator and RW. Supportive daughter and son in law.   NCM shared PT's evaluation /recommendation: home health PT. Pt agreeable to home health services. [facility] has in house therapy services. NCM spoke to [employer] Specialist @ [facility] and made her aware of potential need for home health services (PT/OT).  OT  eval. pending ...   IR consulted to evaluate for possible kyphoplasty, pending .  TOC team following and will assist with TOC needs         Expected Discharge Plan: [place] Barriers to Discharge: Continued Medical Work up   Patient Goals and CMS Choice     Choice offered to / list presented to : Patient      Expected Discharge Plan and Services   Discharge Planning Services: CM Consult   Living arrangements for the past 2 months: [place]: PT [facility] Agency: Other - See comment ([facility]/ in house therapy @ [place])        Prior Living Arrangements/Services Living arrangements for the past 2 months: [place] Lives with:: Self Patient language and need for interpreter reviewed:: Yes Do you feel safe going back to the place where you live?: Yes      Need for Family Participation in Patient Care: Yes (Comment) Care giver  support system in place?: No (comment)   Criminal Activity/Legal Involvement Pertinent to Current Situation/Hospitalization: No - Comment as needed  Activities of Daily Living Home Assistive Devices/Equipment: None ADL Screening (condition at time of admission) Patient's cognitive ability adequate to safely complete daily activities?: Yes Is the patient deaf or have difficulty hearing?: Yes Does the patient have difficulty seeing, even when wearing glasses/contacts?: No Does the patient have difficulty concentrating, remembering, or making decisions?: No Patient able to express need for assistance with ADLs?: Yes Does the patient have difficulty dressing or bathing?: Yes Independently performs ADLs?: No Communication: Independent Dressing (OT): Needs assistance Is this a change from baseline?: Change from baseline, expected to last >3 days Grooming: Needs assistance Is this a change from baseline?: Change from baseline, expected to last >3 days Feeding: Independent Bathing: Needs assistance Is this a change from baseline?: Change from baseline, expected to last >3 days Toileting: Needs assistance Is this a change from baseline?: Change from baseline, expected to last >3days In/Out Bed: Dependent Is this a change from baseline?: Change from baseline, expected to last >3 days Walks in Home: Independent Does the patient have difficulty walking or climbing stairs?: Yes Weakness of Legs: Both Weakness of Arms/Hands: None  Permission Sought/Granted   Permission granted to share information with :  Yes, Verbal Permission Granted  Share Information with NAME: Sofie Rower  Daughter  [phone]           Emotional Assessment Appearance:: Appears stated age Attitude/Demeanor/Rapport: Engaged Affect (typically observed): Accepting Orientation: : Oriented to Self, Oriented to Place, Oriented to  Time, Oriented to Situation Alcohol / Substance Use: Not Applicable Psych Involvement: No  (comment)  Admission diagnosis:  Lumbar compression fracture ([place]) [S32.000A] Closed compression fracture of L2 lumbar vertebra, initial encounter ([place]) [S32.020A] Patient Active Problem List   Diagnosis Date Noted   Lumbar compression fracture ([place]) 06/12/2022   Back pain 09/17/2021   Closed compression fracture of L4 lumbar vertebra, initial encounter ([place]) 09/17/2021   Lumbar spinal stenosis 09/17/2021   HTN (hypertension) 09/17/2021   HLD (hyperlipidemia) 09/17/2021   Gastroesophageal reflux disease without esophagitis 03/01/2019   Sjogren's syndrome ([place]) 05/18/2015   Chronic kidney disease (CKD), stage II (mild) 05/18/2015   Generalized anxiety disorder 05/18/2015   Osteoporosis 05/18/2015   Status post intraocular lens implant 11/14/2011   PCP:  Charlane Ferretti, MD Pharmacy:   [id number] [address], [place] - [address] [place] [address] Phone: [phone] Fax: [phone]  [id number] [place], [address] [place] [address] Phone: [phone] Fax: [phone]     Social Determinants of Health (SDOH) Social History: SDOH Screenings   Food Insecurity: No Food Insecurity (06/13/2022)  Housing: Low Risk  (06/13/2022)  Transportation Needs: No Transportation Needs (06/13/2022)  Utilities: Not At Risk (06/13/2022)  Tobacco Use: Low Risk  (06/12/2022)   SDOH Interventions:     Readmission Risk Interventions     No data to display

## 2022-06-13 NOTE — Progress Notes (Signed)
Lauren Davidson is a 89 y.o. female with medical history significant for hyperlipidemia, hypertension, chronic back pain, atraumatic L4 compression fracture in May 2023, who initially presented to [facility] ED from independent living facility, with complaints of new onset low back pain over the last few days.   CT of lumbar spine revealed L2 compression fracture. EDP discussed the case with neurosurgery on-call Dr. Marcello Moores who recommended TLSO when out of bed for comfort. The patient was transferred to [place] unit for symptoms management. IR consulted to evaluate for possible kyphoplasty. The patient was accepted by [facility], hospitalist service.   Mri of the lumbar spine ordered for further evaluation.    Will monitor.    Hosie Poisson, MD

## 2022-06-13 NOTE — Progress Notes (Signed)
  Echocardiogram 2D Echocardiogram has been performed.  Lauren Davidson 06/13/2022, 3:39 PM

## 2022-06-13 NOTE — Evaluation (Signed)
Physical Therapy Evaluation  Patient Details Name: Lauren Davidson MRN: [id number] DOB: 07/18/1921 Today's Date: 06/13/2022  History of Present Illness  Pt is a 89 y/o female who presents from independent living facility with new onset low back pain. Pt denies recent fall or trauma. CT revealed L2 compression fracture. TLSO recommended for comfort. No significant PMH noted in chart.   Clinical Impression  Pt admitted with above diagnosis. At the time of PT eval, pt was able to demonstrate transfers with up to min assist and RW for support. Did not progress ambulation distance as SpO2 decreasing to 84% on RA at rest. Will need O2 tubing extension or O2 tank to ambulate away from the bed. Focus of session was TLSO adjustment for optimal fit, and getting pt sitting in chair to wash up which was pt's main goal this morning. Pt was educated on precautions, and  brace application/wearing schedule. Pt currently with functional limitations due to the deficits listed below (see PT Problem List). Pt will benefit from skilled PT to increase their independence and safety with mobility to allow discharge to the venue listed below.         Recommendations for follow up therapy are one component of a multi-disciplinary discharge planning process, led by the attending physician.  Recommendations may be updated based on patient status, additional functional criteria and insurance authorization.  Follow Up Recommendations Home health PT      Assistance Recommended at Discharge Frequent or constant Supervision/Assistance  Patient can return home with the following  A little help with walking and/or transfers;A little help with bathing/dressing/bathroom;Assistance with cooking/housework;Assist for transportation;Help with stairs or ramp for entrance    Equipment Recommendations Rolling walker (2 wheels)  Recommendations for Other Services       Functional Status Assessment Patient has had a recent decline in  their functional status and demonstrates the ability to make significant improvements in function in a reasonable and predictable amount of time.     Precautions / Restrictions Precautions Precautions: Fall;Back Precaution Booklet Issued: No Precaution Comments: Pt cued for back precautions throughout functional mobility for comfort. Required Braces or Orthoses: Spinal Brace Spinal Brace: Thoracolumbosacral orthotic;Applied in sitting position Restrictions Weight Bearing Restrictions: No      Mobility  Bed Mobility Overal bed mobility: Needs Assistance Bed Mobility: Rolling, Sidelying to Sit Rolling: Min assist Sidelying to sit: Min assist       General bed mobility comments: Tactile cues to reach for railings for support. Pt initiating log roll well. Assist to complete full roll, get LE's off EOB before elevating trunk to full sitting position.    Transfers Overall transfer level: Needs assistance Equipment used: Rolling walker (2 wheels) Transfers: Sit to/from Stand, Bed to chair/wheelchair/BSC Sit to Stand: Min guard   Step pivot transfers: Min assist       General transfer comment: Pt was able to power up to full stand without assistance. Increased time required and assist required for walker management.    Ambulation/Gait               General Gait Details: Did not progress ambulation at this time. Will need O2 tank or extension tubing for ambulation away from the bed.  Stairs            Wheelchair Mobility    Modified Rankin (Stroke Patients Only)       Balance Overall balance assessment: Needs assistance Sitting-balance support: Feet supported, Single extremity supported Sitting balance-Leahy Scale: Poor Sitting balance -  Comments: Required at least 1 UE support on the bed when sitting Postural control: Posterior lean Standing balance support: Bilateral upper extremity supported, During functional activity, Reliant on assistive device for  balance Standing balance-Leahy Scale: Poor                               Pertinent Vitals/Pain Pain Assessment Pain Assessment: Faces Faces Pain Scale: Hurts even more Pain Location: Back Pain Descriptors / Indicators: Operative site guarding, Sore, Aching, Grimacing, Guarding Pain Intervention(s): Limited activity within patient's tolerance, Monitored during session, Repositioned    Home Living Family/patient expects to be discharged to:: Private residence Living Arrangements: Alone Available Help at Discharge: Family;Friend(s);Available PRN/intermittently;Personal care attendant Type of Home: Independent living facility Home Access: Level entry       Home Layout: One level Home Equipment: Rollator (4 wheels)      Prior Function Prior Level of Function : Independent/Modified Independent                     Hand Dominance   Dominant Hand: Right    Extremity/Trunk Assessment   Upper Extremity Assessment Upper Extremity Assessment: Overall WFL for tasks assessed (Age appropriate)    Lower Extremity Assessment Lower Extremity Assessment: Overall WFL for tasks assessed (Age appropriate)    Cervical / Trunk Assessment Cervical / Trunk Assessment: Kyphotic  Communication   Communication: No difficulties (Extremely HOH without hearing aides.)  Cognition Arousal/Alertness: Awake/alert Behavior During Therapy: WFL for tasks assessed/performed Overall Cognitive Status: Difficult to assess                                 General Comments: Appears WFL and likely baseline but pt's hearing aides not charged and very difficult to communicate.        General Comments      Exercises     Assessment/Plan    PT Assessment Patient needs continued PT services  PT Problem List Decreased strength;Decreased activity tolerance;Decreased balance;Decreased mobility;Decreased knowledge of use of DME;Decreased safety awareness;Decreased knowledge  of precautions;Pain       PT Treatment Interventions DME instruction;Gait training;Stair training;Therapeutic activities;Functional mobility training;Therapeutic exercise;Balance training;Patient/family education    PT Goals (Current goals can be found in the Care Plan section)  Acute Rehab PT Goals Patient Stated Goal: Be able to go back to her independent living apartment. PT Goal Formulation: With patient/family Time For Goal Achievement: 06/27/22 Potential to Achieve Goals: Good    Frequency Min 5X/week     Co-evaluation               AM-PAC PT "6 Clicks" Mobility  Outcome Measure Help needed turning from your back to your side while in a flat bed without using bedrails?: A Little Help needed moving from lying on your back to sitting on the side of a flat bed without using bedrails?: A Little Help needed moving to and from a bed to a chair (including a wheelchair)?: A Little Help needed standing up from a chair using your arms (e.g., wheelchair or bedside chair)?: A Little Help needed to walk in hospital room?: A Little Help needed climbing 3-5 steps with a railing? : Total 6 Click Score: 16    End of Session Equipment Utilized During Treatment: Gait belt;Back brace Activity Tolerance: Patient tolerated treatment well Patient left: in chair;with call bell/phone within reach;with chair alarm set  Nurse Communication: Mobility status PT Visit Diagnosis: Unsteadiness on feet (R26.81);Pain;Difficulty in walking, not elsewhere classified (R26.2) Pain - part of body:  (back)    Time: 0109-3235 PT Time Calculation (min) (ACUTE ONLY): 50 min   Charges:   PT Evaluation $PT Eval Moderate Complexity: 1 Mod PT Treatments $Gait Training: 8-22 mins $Therapeutic Activity: 8-22 mins        Conni Slipper, PT, DPT Acute Rehabilitation Services Secure Chat Preferred Office: [phone]   Marylynn Pearson 06/13/2022, 2:02 PM

## 2022-06-13 NOTE — H&P (Addendum)
History and Physical  Lauren Davidson LOV:[id number] DOB: 06-18-1921 DOA: 06/12/2022  Referring physician: Accepted by Dr. Velia Meyer, [facility], Hospitalist service.  PCP: Charlane Ferretti, MD  Outpatient Specialists: ENT Patient coming from: Independent living facility through [facility] ED.  Chief Complaint: Lower back pain  HPI: Lauren Davidson is a 89 y.o. female with medical history significant for hyperlipidemia, hypertension, chronic back pain, atraumatic L4 compression fracture in May 2023, who initially presented to [facility] ED from independent living facility, with complaints of new onset low back pain over the last few days.  No recent fall or trauma.  No associated weakness involving lower extremities.  Endorses increased difficulty performing her ADLs since onset of her lower back pain.  In the ED, CT of lumbar spine revealed L2 compression fracture.  EDP discussed the case with neurosurgery on-call Dr. Marcello Moores who recommended TLSO when out of bed for comfort.  The patient was transferred to [place] unit for symptoms management.  IR consulted to evaluate for possible kyphoplasty.  The patient was accepted by [facility], hospitalist service.  ED Course: Tmax 98.4.  BP 153/64, pulse 70, respiratory rate 20, oxygen saturation 94% on 2 L.  Lab studies markable for serum sodium 134, BNP 343.  Platelet count 142.  Review of Systems: Review of systems as noted in the HPI. All other systems reviewed and are negative.   Past Medical History:  Diagnosis Date   Hyperlipidemia    History reviewed. No pertinent surgical history.  Social History:  reports that she has never smoked. She has never used smokeless tobacco. She reports that she does not drink alcohol and does not use drugs.   Allergies  Allergen Reactions   Statins Other (See Comments)    Muscle weakness and pain   Buprenorphine Hcl Other (See Comments)    Patient questioned this entry   Buspirone Other (See Comments)     Weakness    Lexapro [Escitalopram] Other (See Comments)    "Muscle problems"   Lovastatin Other (See Comments)    Muscle pain   Meperidine Other (See Comments)    Demerol- questionable allergy- reaction not recalled   Morphine And Related Rash    Family history: None reported.  Prior to Admission medications   Medication Sig Start Date End Date Taking? Authorizing Provider  acetaminophen (TYLENOL) 500 MG tablet Take 500 mg by mouth every 6 (six) hours as needed (for pain).    [provider]  Calcium Carb-Cholecalciferol (CALCIUM+D3 PO) Take 1 tablet by mouth daily with breakfast.    [provider]  docusate sodium (COLACE) 100 MG capsule Take 1 capsule (100 mg total) by mouth 2 (two) times daily. 09/20/21   Donne Hazel, MD  fish oil-omega-3 fatty acids 1000 MG capsule Take 1,000 mg by mouth 2 (two) times daily after a meal.    [provider]  lidocaine (LIDODERM) 5 % Place 1 patch onto the skin daily. Remove & Discard patch within 12 hours or as directed by MD Patient not taking: Reported on 09/17/2021 11/03/18   Lennice Sites, DO  meloxicam (MOBIC) 15 MG tablet Take 15 mg by mouth daily.    [provider]  Multiple Vitamins-Minerals (MULTIVITAMIN WITH MINERALS) tablet Take 1 tablet by mouth daily with lunch.    [provider]  Multiple Vitamins-Minerals (PRESERVISION AREDS 2) CAPS Take 1 capsule by mouth in the morning and at bedtime.    [provider]  oxyCODONE (OXY IR/ROXICODONE) 5 MG  immediate release tablet Take 1 tablet (5 mg total) by mouth every 4 (four) hours as needed for severe pain. 09/20/21   Jerald Kief, MD  SYSTANE OVERNIGHT THERAPY 0.3 % GEL ophthalmic ointment Place 1 application. into both eyes at bedtime.    [provider]  SYSTANE ULTRA PF 0.4-0.3 % SOLN Place 1 drop into both eyes 4 (four) times daily.    [provider]    Physical Exam: BP (!) 153/64   Pulse 73   Temp 98.1 F  (36.7 C) (Oral)   Resp 16   Ht 5\' 4"  (1.626 m)   Wt 63.5 kg   SpO2 95%   BMI 24.03 kg/m   General: 89 y.o. year-old female well developed well nourished in no acute distress.  Alert and oriented x3. Cardiovascular: Regular rate and rhythm with no rubs or gallops.  No thyromegaly or JVD noted.  Trace lower extremity edema bilaterally. Respiratory: Clear to auscultation with no wheezes or rales. Good inspiratory effort. Abdomen: Soft nontender nondistended with normal bowel sounds x4 quadrants. Muskuloskeletal: No cyanosis or clubbing.  Trace edema noted bilaterally in lower extremities. Neuro: CN II-XII intact, strength, sensation, reflexes Skin: No ulcerative lesions noted or rashes Psychiatry: Judgement and insight appear normal. Mood is appropriate for condition and setting          Labs on Admission:  Basic Metabolic Panel: Recent Labs  Lab 06/12/22 1550  NA 134*  K 4.0  CL 99  CO2 27  GLUCOSE 103*  BUN 17  CREATININE 0.73  CALCIUM 9.5   Liver Function Tests: No results for input(s): "AST", "ALT", "ALKPHOS", "BILITOT", "PROT", "ALBUMIN" in the last 168 hours. No results for input(s): "LIPASE", "AMYLASE" in the last 168 hours. No results for input(s): "AMMONIA" in the last 168 hours. CBC: Recent Labs  Lab 06/12/22 1550  WBC 9.7  NEUTROABS 8.1*  HGB 13.2  HCT 38.4  MCV 101.3*  PLT 142*   Cardiac Enzymes: No results for input(s): "CKTOTAL", "CKMB", "CKMBINDEX", "TROPONINI" in the last 168 hours.  BNP (last 3 results) Recent Labs    06/12/22 1550  BNP 343.0*    ProBNP (last 3 results) No results for input(s): "PROBNP" in the last 8760 hours.  CBG: No results for input(s): "GLUCAP" in the last 168 hours.  Radiological Exams on Admission: CT Lumbar Spine Wo Contrast  Result Date: 06/12/2022 CLINICAL DATA:  Trauma EXAM: CT LUMBAR SPINE WITHOUT CONTRAST TECHNIQUE: Multidetector CT imaging of the lumbar spine was performed without intravenous contrast  administration. Multiplanar CT image reconstructions were also generated. RADIATION DOSE REDUCTION: This exam was performed according to the departmental dose-optimization program which includes automated exposure control, adjustment of the mA and/or kV according to patient size and/or use of iterative reconstruction technique. COMPARISON:  Same day lumbar spine radiograph, lumbar spine CT 09/17/2021 FINDINGS: Segmentation: 5 lumbar type vertebrae. Alignment: Normal. Vertebrae: Redemonstrated is a severe chronic deformity at L4 with progressive height loss along the anterior aspect of the vertebral body compared to 09/17/2021. compared to prior exam there is a new superior endplate compression deformity at the L2 vertebral body level without significant height loss. Paraspinal and other soft tissues: Aortic atherosclerotic calcification. Disc levels: There is no significant change from prior exam with persistent moderate spinal canal stenosis at L1-L2, severe spinal canal stenosis at L2-L5. there is also severe bilateral neural foraminal stenosis at L3-L4 and L4-L5. IMPRESSION: 1. New superior endplate compression deformity at the L2 vertebral body level without significant  height loss. 2. Severe chronic deformity at L4 with progressive height loss along the anterior aspect of the vertebral body compared to 09/17/2021. 3. Unchanged severe spinal canal stenosis at L2-L5 and severe bilateral neural foraminal stenosis at L3-L4 and L4-L5. Aortic Atherosclerosis (ICD10-I70.0). Electronically Signed   By: Marin Roberts M.D.   On: 06/12/2022 17:41   DG Lumbar Spine Complete  Result Date: 06/12/2022 CLINICAL DATA:  Lower back pain EXAM: LUMBAR SPINE - COMPLETE 4+ VIEW COMPARISON:  12/05/2021 FINDINGS: Osseous demineralization. Five non-rib-bearing lumbar vertebra. Marked compression deformity of L4 vertebral body similar to previous study. Mild superior endplate compression deformity of L2 with minimal height loss, new.  No additional fracture, subluxation, or bone destruction. Multilevel facet degenerative changes. Minimal levoconvex lumbar scoliosis. SI joints preserved. Atherosclerotic calcifications aorta. IMPRESSION: Chronic L4 compression fracture with minimal superior endplate compression deformity of L2 vertebral body new since 12/05/2021. Aortic Atherosclerosis (ICD10-I70.0). Electronically Signed   By: Lavonia Dana M.D.   On: 06/12/2022 16:13   DG Chest 2 View  Result Date: 06/12/2022 CLINICAL DATA:  Hypoxia, new oxygen requirement EXAM: CHEST - 2 VIEW COMPARISON:  11/03/2018 FINDINGS: Enlargement of cardiac silhouette. Mediastinal contours and pulmonary vascularity normal. Atherosclerotic calcification aorta. Emphysematous changes with chronic accentuation of interstitial markings unchanged. Small bibasilar pleural effusions. No acute infiltrate or pneumothorax. Bones demineralized with slightly increased compression fracture of a midthoracic vertebra. IMPRESSION: COPD changes with small bibasilar pleural effusions increased versus previous study. Chronic accentuation of interstitial markings without acute infiltrate. Slightly increased compression fracture of a midthoracic vertebra. Aortic Atherosclerosis (ICD10-I70.0) and Emphysema (ICD10-J43.9). Electronically Signed   By: Lavonia Dana M.D.   On: 06/12/2022 16:11    EKG: I independently viewed the EKG done and my findings are as followed: Sinus rhythm first-degree AV block.  Nonspecific ST-T changes.  QTc 505.  Assessment/Plan Present on Admission:  Lumbar compression fracture (HCC)  Principal Problem:   Lumbar compression fracture (HCC)  Atraumatic L2 compression fracture, POA EDP discussed the case with neurosurgery, recommended TLSO when out of bed for comfort. IR consulted for possible kyphoplasty Pain control and bowel regimen  Elevated BNP BNP on presentation 343 No prior history of heart failure Follow 2D echo  Mild hyponatremia Serum  sodium 134 Repeat BMP  Physical debility PT OT assessment Fall precautions.  Prolonged QTc QTc on twelve-lead EKG 505. Avoid QTc prolonging agents Optimize magnesium and potassium levels    DVT prophylaxis: Subcu Lovenox daily  Code Status: DNR per the patient herself.  Family Communication: None at bedside.  Disposition Plan: Admitted to MedSurg unit  Consults called: Neurosurgery consulted by EDP.  IR consulted.  Admission status: Observation status.   Status is: Observation    Kayleen Memos MD [facility] Pager [phone]  If 7PM-7AM, please contact night-coverage [email or web address] Password [email or web address]  06/13/2022, 5:09 AM

## 2022-06-13 NOTE — Progress Notes (Signed)
Orthopedic Tech Progress Note Patient Details:  CRISTIE MCKINNEY 15-Jan-1922 [id number]  Ortho Devices Type of Ortho Device: Thoracolumbar corset (TLSO) Ortho Device/Splint Location: Back Ortho Device/Splint Interventions: Ordered, Adjustment  Patient stated she had back brace already but it was not a TLSO. Sized and left brace at bedside for patient to use OOB only.     Danton Sewer A [place] Berkey 06/13/2022, 9:51 AM

## 2022-06-13 NOTE — Progress Notes (Signed)
OT Cancellation Note  Patient Details Name: Lauren Davidson MRN: [id number] DOB: 1922-01-17   Cancelled Treatment:    Reason Eval/Treat Not Completed: Patient at procedure or test/ unavailable (Pt receiving echocardiogram and unable to participate in OT evaluation. Will re-attempt at a later time when able.)  Ailene Ravel, OTR/L,CBIS  Supplemental OT - [facility] and [facility] Secure Chat Preferred   06/13/2022, 3:58 PM

## 2022-06-14 DIAGNOSIS — S32020D Wedge compression fracture of second lumbar vertebra, subsequent encounter for fracture with routine healing: Secondary | ICD-10-CM | POA: Diagnosis not present

## 2022-06-14 DIAGNOSIS — F419 Anxiety disorder, unspecified: Secondary | ICD-10-CM

## 2022-06-14 DIAGNOSIS — I10 Essential (primary) hypertension: Secondary | ICD-10-CM | POA: Diagnosis not present

## 2022-06-14 MED ORDER — ESCITALOPRAM OXALATE 10 MG PO TABS
10.0000 mg | ORAL_TABLET | Freq: Every day | ORAL | Status: DC
  Administered 2022-06-14 – 2022-06-25 (×4): 10 mg via ORAL
  Filled 2022-06-14 (×10): qty 1

## 2022-06-14 MED ORDER — ARTIFICIAL TEARS OPHTHALMIC OINT
1.0000 | TOPICAL_OINTMENT | Freq: Every day | OPHTHALMIC | Status: DC
  Administered 2022-06-20 – 2022-06-24 (×5): 1 via OPHTHALMIC
  Filled 2022-06-14: qty 3.5

## 2022-06-14 MED ORDER — DOCUSATE SODIUM 100 MG PO CAPS
100.0000 mg | ORAL_CAPSULE | Freq: Two times a day (BID) | ORAL | Status: DC
  Administered 2022-06-14 – 2022-06-25 (×18): 100 mg via ORAL
  Filled 2022-06-14 (×20): qty 1

## 2022-06-14 MED ORDER — POLYVINYL ALCOHOL 1.4 % OP SOLN
1.0000 [drp] | Freq: Four times a day (QID) | OPHTHALMIC | Status: DC
  Administered 2022-06-14 – 2022-06-25 (×39): 1 [drp] via OPHTHALMIC
  Filled 2022-06-14: qty 15

## 2022-06-14 NOTE — Plan of Care (Signed)

## 2022-06-14 NOTE — Evaluation (Signed)
Occupational Therapy Evaluation Patient Details Name: Lauren Davidson MRN: [id number] DOB: October 23, 1921 Today's Date: 06/14/2022   History of Present Illness Pt is a 89 y/o female who presents from independent living facility with new onset low back pain. Pt denies recent fall or trauma. CT revealed L2 compression fracture. TLSO recommended for comfort. No significant PMH noted in chart.   Clinical Impression   Pt in bed upon therapy arrival and agreeable to participate in OT evaluation. Reports that she does better in the afternoon versus in the morning. Pt admitted with L2 compression fracture. Pt currently with functional limitations due to the deficits listed below (see OT Problem List). Prior to admit, pt was living at [facility] and completing all BADL tasks and functional transfers/mobility at Mod I level. Currently, pt is experiencing increased pain which is limiting her ability to participate in BADL tasks without increased assist.  Pt will benefit from skilled OT to increase their safety and independence with ADL and functional mobility for ADL to facilitate discharge to venue listed below. Pt will more than likely need to wear the TLSO for comfort for a short amount of time and recommend having assistance to don when discharged back to ILF.        Recommendations for follow up therapy are one component of a multi-disciplinary discharge planning process, led by the attending physician.  Recommendations may be updated based on patient status, additional functional criteria and insurance authorization.   Follow Up Recommendations  Home health OT     Assistance Recommended at Discharge PRN (Pt will need assist to don TLSO brace when up out of bed)  Patient can return home with the following A little help with walking and/or transfers;A little help with bathing/dressing/bathroom;Assist for transportation    Functional Status Assessment  Patient has had a recent decline in their  functional status and demonstrates the ability to make significant improvements in function in a reasonable and predictable amount of time.  Equipment Recommendations  BSC/3in1    Recommendations for Other Services       Precautions / Restrictions Precautions Precautions: Fall;Back Precaution Booklet Issued: No Precaution Comments: Pt cued for back precautions throughout functional mobility for comfort. Required Braces or Orthoses: Spinal Brace Spinal Brace: Thoracolumbosacral orthotic;Applied in sitting position Restrictions Weight Bearing Restrictions: No      Mobility Bed Mobility Overal bed mobility: Needs Assistance Bed Mobility: Rolling, Sidelying to Sit Rolling: Min assist Sidelying to sit: Min assist       General bed mobility comments: Tactile cues to reach for railings for support. Pt initiating log roll well. Assist to complete full roll, get LE's off EOB before elevating trunk to full sitting position. Patient Response: Cooperative  Transfers Overall transfer level: Needs assistance Equipment used: Rolling walker (2 wheels) Transfers: Sit to/from Stand, Bed to chair/wheelchair/BSC Sit to Stand: Min guard     Step pivot transfers: Min guard     General transfer comment: Increased time needed to complete stand to sit transition due to back pain.      Balance Overall balance assessment: Needs assistance Sitting-balance support: No upper extremity supported, Feet supported Sitting balance-Leahy Scale: Fair Sitting balance - Comments: sitting EOB   Standing balance support: Bilateral upper extremity supported, During functional activity, Reliant on assistive device for balance Standing balance-Leahy Scale: Poor       ADL either performed or assessed with clinical judgement   ADL Overall ADL's : Needs assistance/impaired Eating/Feeding: Set up;Sitting   Grooming: Wash/dry  face;Wash/dry hands;Oral care;Sitting;Set up   Upper Body Bathing: Set  up;Sitting   Lower Body Bathing: Minimal assistance;Sit to/from stand   Upper Body Dressing : Set up;Sitting   Lower Body Dressing: Minimal assistance;Sit to/from stand   Toilet Transfer: Min guard;Rolling walker (2 wheels);BSC/3in1   Toileting- Water quality scientist and Hygiene: Minimal assistance;Sit to/from stand               Vision Baseline Vision/History: 1 Wears glasses Ability to See in Adequate Light: 0 Adequate Patient Visual Report: No change from baseline Vision Assessment?: No apparent visual deficits            Pertinent Vitals/Pain Pain Assessment Pain Assessment: Faces Faces Pain Scale: Hurts even more Pain Location: Back - during mobility Pain Descriptors / Indicators: Operative site guarding, Sore, Aching, Grimacing, Guarding Pain Intervention(s): Limited activity within patient's tolerance, Monitored during session, Patient requesting pain meds-RN notified     Hand Dominance Right   Extremity/Trunk Assessment Upper Extremity Assessment Upper Extremity Assessment: Overall WFL for tasks assessed   Lower Extremity Assessment Lower Extremity Assessment: Defer to PT evaluation   Cervical / Trunk Assessment Cervical / Trunk Assessment: Kyphotic   Communication Communication Communication: HOH   Cognition Arousal/Alertness: Awake/alert Behavior During Therapy: WFL for tasks assessed/performed Overall Cognitive Status: Difficult to assess         General Comments: Appears to be [facility]. Hearding aids are both in. Pt did require some repeating of questions due to hearing.                Home Living Family/patient expects to be discharged to:: Private residence ([place]) Living Arrangements: Alone Available Help at Discharge: Family;Friend(s);Available PRN/intermittently;Personal care attendant Type of Home: Independent living facility Home Access: Level entry     Home Layout: One level     Bathroom Shower/Tub: Emergency planning/management officer: Handicapped height     Home Equipment: Rollator (4 wheels)          Prior Functioning/Environment Prior Level of Function : Independent/Modified Independent            OT Problem List: Decreased strength;Impaired balance (sitting and/or standing);Pain;Decreased activity tolerance      OT Treatment/Interventions: Therapeutic exercise;Self-care/ADL training;Neuromuscular education;Therapeutic activities;Energy conservation;Patient/family education;DME and/or AE instruction;Balance training;Manual therapy;Modalities    OT Goals(Current goals can be found in the care plan section) Acute Rehab OT Goals Patient Stated Goal: to decreased pain OT Goal Formulation: With patient Time For Goal Achievement: 06/28/22 Potential to Achieve Goals: Good  OT Frequency: Min 2X/week       AM-PAC OT "6 Clicks" Daily Activity     Outcome Measure Help from another person eating meals?: None Help from another person taking care of personal grooming?: None Help from another person toileting, which includes using toliet, bedpan, or urinal?: A Little Help from another person bathing (including washing, rinsing, drying)?: A Little Help from another person to put on and taking off regular upper body clothing?: A Little Help from another person to put on and taking off regular lower body clothing?: A Little 6 Click Score: 20   End of Session Equipment Utilized During Treatment: Rolling walker (2 wheels) Nurse Communication: Patient requests pain meds  Activity Tolerance: Patient tolerated treatment well;Patient limited by pain Patient left: in chair;with call bell/phone within reach;with chair alarm set  OT Visit Diagnosis: Muscle weakness (generalized) (M62.81);Repeated falls (R29.6)                Time:  7341-9379 OT Time Calculation (min): 38 min Charges:  OT General Charges $OT Visit: 1 Visit OT Evaluation $OT Eval Moderate Complexity: 1 Mod  [employer],  OTR/L,CBIS  Supplemental OT - [facility] and [facility] Secure Chat Preferred    Birney Belshe, Clarene Duke 06/14/2022, 11:45 AM

## 2022-06-14 NOTE — Progress Notes (Signed)
Physical Therapy Treatment Patient Details Name: Lauren Davidson MRN: [id number] DOB: [birth year] Today's Date: 06/14/2022   History of Present Illness Pt is a 89 y/o female who presents from independent living facility with new onset low back pain. Pt denies recent fall or trauma. CT revealed L2 compression fracture. TLSO recommended for comfort. No significant PMH noted in chart.    PT Comments    Pt admitted with above diagnosis. Pt was able to ambulate with RW and incr distance today with overall good safety awareness. Should progress and be able to go back to [employer]. Will need someone to assist with brace application in am's and donning in evenings most likely.  Will continue to follow acutely.  Pt currently with functional limitations due to balance and endurance deficits. Pt will benefit from skilled PT to increase their independence and safety with mobility to allow discharge to the venue listed below.      Recommendations for follow up therapy are one component of a multi-disciplinary discharge planning process, led by the attending physician.  Recommendations may be updated based on patient status, additional functional criteria and insurance authorization.  Follow Up Recommendations  Home health PT     Assistance Recommended at Discharge Frequent or constant Supervision/Assistance  Patient can return home with the following A little help with walking and/or transfers;A little help with bathing/dressing/bathroom;Assistance with cooking/housework;Assist for transportation;Help with stairs or ramp for entrance   Equipment Recommendations  Rolling walker (2 wheels)    Recommendations for Other Services       Precautions / Restrictions Precautions Precautions: Fall;Back Precaution Booklet Issued: No Precaution Comments: Pt cued for back precautions throughout functional mobility for comfort. Required Braces or Orthoses: Spinal Brace Spinal Brace: Thoracolumbosacral  orthotic;Applied in sitting position Restrictions Weight Bearing Restrictions: No     Mobility  Bed Mobility               General bed mobility comments: in chair on arrival    Transfers Overall transfer level: Needs assistance Equipment used: Rolling walker (2 wheels) Transfers: Sit to/from Stand, Bed to chair/wheelchair/BSC Sit to Stand: Min guard   Step pivot transfers: Min guard       General transfer comment: Increased time needed to complete stand to sit transition due to back pain.    Ambulation/Gait Ambulation/Gait assistance: Min guard Gait Distance (Feet): 45 Feet Assistive device: Rolling walker (2 wheels) Gait Pattern/deviations: Step-through pattern, Decreased stride length, Trunk flexed   Gait velocity interpretation: <1.31 ft/sec, indicative of household ambulator   General Gait Details: Pt progressed ambulation with RW with overall good stability. A little assist to steer RW at times as well as cues to stand tall as pt tends to flex at trunk and knees.   Stairs             Wheelchair Mobility    Modified Rankin (Stroke Patients Only)       Balance           Standing balance support: Bilateral upper extremity supported, During functional activity, Reliant on assistive device for balance Standing balance-Leahy Scale: Poor Standing balance comment: relies on RW and UE support                            Cognition Arousal/Alertness: Awake/alert Behavior During Therapy: WFL for tasks assessed/performed Overall Cognitive Status: Difficult to assess  General Comments: Appears to be [facility]. Hearding aids are both in. Pt did require some repeating of questions due to hearing.        Exercises General Exercises - Lower Extremity Ankle Circles/Pumps: AROM, Both, 10 reps, Supine Quad Sets: AROM, Both, 10 reps, Supine Long Arc Quad: AROM, Both, 10 reps, Seated Hip Flexion/Marching:  AROM, Both, 10 reps, Seated    General Comments General comments (skin integrity, edema, etc.): Educated regarding brace application with pt needing reinforcement      Pertinent Vitals/Pain Pain Assessment Pain Assessment: Faces Faces Pain Scale: Hurts even more Pain Location: Back - during mobility Pain Descriptors / Indicators: Operative site guarding, Sore, Aching, Grimacing, Guarding Pain Intervention(s): Limited activity within patient's tolerance, Monitored during session, Repositioned    Home Living Family/patient expects to be discharged to:: Private residence ([place]) Living Arrangements: Alone Available Help at Discharge: Family;Friend(s);Available PRN/intermittently;Personal care attendant Type of Home: Independent living facility Home Access: Level entry       Home Layout: One level Home Equipment: Rollator (4 wheels)      Prior Function            PT Goals (current goals can now be found in the care plan section) Acute Rehab PT Goals Patient Stated Goal: Be able to go back to her independent living apartment. Progress towards PT goals: Progressing toward goals    Frequency    Min 5X/week      PT Plan Current plan remains appropriate    Co-evaluation              AM-PAC PT "6 Clicks" Mobility   Outcome Measure  Help needed turning from your back to your side while in a flat bed without using bedrails?: A Little Help needed moving from lying on your back to sitting on the side of a flat bed without using bedrails?: A Little Help needed moving to and from a bed to a chair (including a wheelchair)?: A Little Help needed standing up from a chair using your arms (e.g., wheelchair or bedside chair)?: A Little Help needed to walk in hospital room?: A Little Help needed climbing 3-5 steps with a railing? : Total 6 Click Score: 16    End of Session Equipment Utilized During Treatment: Gait belt;Back brace Activity Tolerance: Patient  tolerated treatment well Patient left: in chair;with call bell/phone within reach;with chair alarm set Nurse Communication: Mobility status PT Visit Diagnosis: Unsteadiness on feet (R26.81);Pain;Difficulty in walking, not elsewhere classified (R26.2) Pain - part of body:  (back)     Time: 3614-4315 PT Time Calculation (min) (ACUTE ONLY): 23 min  Charges:  $Gait Training: 8-22 mins $Therapeutic Exercise: 8-22 mins                     [facility] M,PT Acute Rehab Services [phone]    Alvira Philips 06/14/2022, 2:32 PM

## 2022-06-14 NOTE — Progress Notes (Signed)
Procedure request for kyphoplasty for L2 & L4. Case approved by [facility] Attending Dr. Raliegh Ip. [facility] approval for KP pending at this time. IR will continue to follow and plan to proceed once insurance approves procedure.     Please call with questions or concerns.

## 2022-06-14 NOTE — Progress Notes (Signed)
[facility]                                                                               Otilia Kareem, is a 89 y.o. female, DOB - Sep 09, 1921, JXB:[id number] Admit date - 06/12/2022    Outpatient Primary MD for the patient is Thana Ates, MD  LOS - 0  days    Brief summary    MARGARETTE VANNATTER is a 89 y.o. female with medical history significant for hyperlipidemia, hypertension, chronic back pain, atraumatic L4 compression fracture in May 2023, who initially presented to [facility] ED from independent living facility, with complaints of new onset low back pain over the last few days.    CT of lumbar spine revealed L2 compression fracture. EDP discussed the case with neurosurgery on-call Dr. Maisie Fus who recommended TLSO when out of bed for comfort. The patient was transferred to [facility] MedSurg unit for symptoms management. IR consulted to evaluate for possible kyphoplasty. The patient was accepted by [facility], hospitalist service.    Mri of the lumbar spine ordered for further evaluation.     Assessment & Plan    Assessment and Plan:   L2 compression fracture:  MRI of the lumbar spine done and results discussed with family and the patient.  Awaiting IR recommendations on the timing of the kyphoplasty.  Therapy eval recommending home health PT.    Deconditioning:  Therapy evals.    Hypertension:  Well controlled BP parameters.   Anxiety:  On lexapro , continue the same.      Estimated body mass index is 24.03 kg/m as calculated from the following:   Height as of this encounter: 5\' 4"  (1.626 m).   Weight as of this encounter: 63.5 kg.  Code Status: DNR DVT Prophylaxis:  enoxaparin (LOVENOX) injection 40 mg Start: 06/13/22 1000   Level of Care: Level of care: Med-Surg Family Communication: discussed with patient's family on the phone.   Disposition Plan:     Remains inpatient appropriate:   Procedures:  None.   Consultants:    IR  Antimicrobials:   Anti-infectives (From admission, onward)    None        Medications  Scheduled Meds:  enoxaparin (LOVENOX) injection  40 mg Subcutaneous Daily   lidocaine  1 patch Transdermal Q24H   multivitamin with minerals  1 tablet Oral Q lunch   senna-docusate  1 tablet Oral QHS   Continuous Infusions: PRN Meds:.acetaminophen, melatonin, oxyCODONE, polyethylene glycol, prochlorperazine    Subjective:   Naara Kelty was seen and examined today.  Pain controlled.   Objective:   Vitals:   06/13/22 0847 06/13/22 2123 06/14/22 0850 06/14/22 1455  BP: (!) 144/61 (!) 109/51 (!) 163/72 (!) 121/54  Pulse: 79 79 81 67  Resp: 17 18 19 17   Temp: 99.4 F (37.4 C) 98.6 F (37 C) 98.5 F (36.9 C) 98.4 F (36.9 C)  TempSrc: Oral Oral Oral Oral  SpO2: 95% 92% 90% 92%  Weight:      Height:       No intake or output data in the 24 hours ending 06/14/22 1527 Filed Weights   06/12/22 1357  Weight:  63.5 kg     Exam General exam: Appears calm and comfortable  Respiratory system: Clear to auscultation. Respiratory effort normal. Cardiovascular system: S1 & S2 heard, RRR. No JVD, No pedal edema. Gastrointestinal system: Abdomen is nondistended, soft and nontender.  Central nervous system: Alert and oriented. No focal neurological deficits. Extremities: Symmetric 5 x 5 power. Skin: No rashes, lesions or ulcers Psychiatry: Mood & affect appropriate.     Data Reviewed:  I have personally reviewed following labs and imaging studies   CBC Lab Results  Component Value Date   WBC 8.3 06/13/2022   RBC 3.64 (L) 06/13/2022   HGB 12.8 06/13/2022   HCT 36.9 06/13/2022   MCV 101.4 (H) 06/13/2022   MCH 35.2 (H) 06/13/2022   PLT 133 (L) 06/13/2022   MCHC 34.7 06/13/2022   RDW 13.1 06/13/2022   LYMPHSABS 0.6 (L) 06/12/2022   MONOABS 0.9 06/12/2022   EOSABS 0.0 06/12/2022   BASOSABS 0.0 30/86/5784     Last metabolic panel Lab Results  Component Value Date    NA 134 (L) 06/13/2022   K 4.0 06/13/2022   CL 100 06/13/2022   CO2 25 06/13/2022   BUN 13 06/13/2022   CREATININE 0.76 06/13/2022   GLUCOSE 82 06/13/2022   GFRNONAA >60 06/13/2022   CALCIUM 8.5 (L) 06/13/2022   PHOS 2.7 06/13/2022   PROT 6.2 (L) 06/13/2022   ALBUMIN 3.1 (L) 06/13/2022   BILITOT 1.5 (H) 06/13/2022   ALKPHOS 68 06/13/2022   AST 38 06/13/2022   ALT 23 06/13/2022   ANIONGAP 9 06/13/2022    CBG (last 3)  No results for input(s): "GLUCAP" in the last 72 hours.    Coagulation Profile: No results for input(s): "INR", "PROTIME" in the last 168 hours.   Radiology Studies: MR LUMBAR SPINE WO CONTRAST  Result Date: 06/13/2022 CLINICAL DATA:  Follow-up examination for compression fracture. EXAM: MRI LUMBAR SPINE WITHOUT CONTRAST TECHNIQUE: Multiplanar, multisequence MR imaging of the lumbar spine was performed. No intravenous contrast was administered. COMPARISON:  Prior CT from 06/12/2022. FINDINGS: Segmentation: Standard. Lowest well-formed disc space labeled the L5-S1 level. Alignment: Mild levoscoliosis. Mild grade 1 degenerative stepwise anterolisthesis of L2 on L3 through L4 on L5. Vertebrae: Acute compression fracture involving the upper-mid L2 vertebral body is seen. Associated height loss measures up to 20% with trace 2 mm bony retropulsion. Compression deformity involving the L4 vertebral body demonstrates an acute to subacute component with associated marrow edema. Associated height loss measures up to 70% with 4 mm bony retropulsion. Otherwise, vertebral body height maintained. Reactive endplate changes at the inferior endplate of L3 and superior endplate of L5 noted, favored to be degenerative. Underlying bone marrow signal intensity within normal limits. No worrisome osseous lesions. Prominent reactive marrow edema noted about the right greater than left L3-4 and L4-5 facets due to facet arthritis. Conus medullaris and cauda equina: Conus extends to the L1 level.  Conus and cauda equina appear normal. Paraspinal and other soft tissues: Paraspinous soft tissues demonstrate no acute finding. 2.2 cm cystic lesion within the left upper quadrant most likely reflects a small gastric diverticulum, also seen on prior CT. Colonic diverticulosis noted. Disc levels: T12-L1: Disc desiccation with mild disc bulge. Mild endplate spurring. Superimposed tiny central disc protrusion with annular fissure. No spinal stenosis. Mild bilateral foraminal narrowing. L1-2: Diffuse disc bulge with disc desiccation. Disc bulging eccentric to the left. Associated mild endplate spurring with trace 2 mm bony retropulsion related to the L2 fracture. Mild facet hypertrophy.  Resultant mild left lateral recess stenosis. Central canal remains patent. Foramina remain patent. L2-3: Diffuse disc bulge with disc desiccation. Superimposed broad-based left subarticular to extraforaminal disc protrusion (series 8, image 15). Mild to moderate facet hypertrophy. Resultant mild canal with left lateral recess stenosis. Mild right with moderate left L2 foraminal narrowing. L3-4: Degenerative intervertebral disc space narrowing with diffuse disc bulge and disc desiccation. Reactive endplate change with up to 4 mm bony retropulsion related to the L4 fracture. Moderate facet and ligament flavum hypertrophy. Resultant severe spinal stenosis with the thecal sac measuring 4-5 mm in AP diameter at its most narrow point. Moderate bilateral L3 foraminal stenosis. L4-5: Disc bulge with disc desiccation and reactive endplate spurring. Moderate facet hypertrophy. Resultant mild canal with bilateral subarticular stenosis. Moderate right worse than left L4 foraminal narrowing. L5-S1: Disc desiccation without significant disc bulge. Mild endplate spurring. Mild facet hypertrophy. No spinal stenosis. Foramina remain patent IMPRESSION: 1. Acute compression fracture involving the L2 vertebral body with up to 20% height loss and trace 2 mm  bony retropulsion. 2. Acute to subacute compression fracture involving the L4 vertebral body with up to 70% height loss and 4 mm bony retropulsion. 3. Underlying multilevel degenerative spondylosis and facet arthrosis, most pronounced at L3-4 where there is resultant severe spinal stenosis with moderate bilateral L3 foraminal narrowing. Additional moderate left L2 and bilateral L4 foraminal stenosis. 4. Prominent reactive marrow edema about the right greater than left L3-4 and L4-5 facets due to facet arthritis. Finding could also contribute to lower back pain. Electronically Signed   By: Jeannine Boga M.D.   On: 06/13/2022 20:39   ECHOCARDIOGRAM COMPLETE  Result Date: 06/13/2022    ECHOCARDIOGRAM REPORT   Patient Name:   LANIESHA DAS Date of Exam: 06/13/2022 Medical Rec #:  [id number]      Height:       64.0 in Accession #:    [id number]     Weight:       140.0 lb Date of Birth:  1922/03/19      BSA:          1.681 m Patient Age:    4 years       BP:           144/61 mmHg Patient Gender: F              HR:           79 bpm. Exam Location:  Inpatient Procedure: 2D Echo, 3D Echo, Color Doppler and Cardiac Doppler Indications:    I50.40* Unspecified combined systolic (congestive) and diastolic                 (congestive) heart failure  History:        Patient has no prior history of Echocardiogram examinations.                 Mitral Valve Prolapse and Mitral Valve Disease; Risk                 Factors:Hypertension and Dyslipidemia.  Sonographer:    Roseanna Rainbow RDCS Referring Phys: Kayleen Memos  Sonographer Comments: Technically difficult study due to poor echo windows. IMPRESSIONS  1. Left ventricular ejection fraction, by estimation, is 60 to 65%. The left ventricle has normal function. The left ventricle has no regional wall motion abnormalities. There is moderate asymmetric left ventricular hypertrophy of the basal-septal segment. Left ventricular diastolic parameters are consistent with Grade I  diastolic dysfunction (impaired relaxation).  There is the interventricular septum is flattened in systole and diastole, consistent with right ventricular pressure and volume overload.  2. Right ventricular systolic function is normal. The right ventricular size is severely enlarged. There is moderately elevated pulmonary artery systolic pressure. The estimated right ventricular systolic pressure is 55.1 mmHg.  3. HR 79 bpm. The mitral valve is degenerative. Mild to moderate mitral valve regurgitation. Mild mitral stenosis. The mean mitral valve gradient is 5.0 mmHg. Severe mitral annular calcification.  4. Tricuspid valve regurgitation is moderate.  5. The aortic valve was not well visualized. Aortic valve regurgitation is not visualized.  6. The inferior vena cava is normal in size with <50% respiratory variability, suggesting right atrial pressure of 8 mmHg. FINDINGS  Left Ventricle: Left ventricular ejection fraction, by estimation, is 60 to 65%. The left ventricle has normal function. The left ventricle has no regional wall motion abnormalities. The left ventricular internal cavity size was normal in size. There is  moderate asymmetric left ventricular hypertrophy of the basal-septal segment. The interventricular septum is flattened in systole and diastole, consistent with right ventricular pressure and volume overload. Left ventricular diastolic parameters are consistent with Grade I diastolic dysfunction (impaired relaxation). Right Ventricle: The right ventricular size is severely enlarged. Right ventricular systolic function is normal. There is moderately elevated pulmonary artery systolic pressure. The tricuspid regurgitant velocity is 3.43 m/s, and with an assumed right atrial pressure of 8 mmHg, the estimated right ventricular systolic pressure is 55.1 mmHg. Left Atrium: Left atrial size was normal in size. Right Atrium: Right atrial size was normal in size. Pericardium: There is no evidence of  pericardial effusion. Mitral Valve: HR 79 bpm. The mitral valve is degenerative in appearance. Severe mitral annular calcification. Mild to moderate mitral valve regurgitation. Mild mitral valve stenosis. MV peak gradient, 10.5 mmHg. The mean mitral valve gradient is 5.0 mmHg. Tricuspid Valve: Tricuspid valve regurgitation is moderate. Aortic Valve: The aortic valve was not well visualized. Aortic valve regurgitation is not visualized. Aortic valve mean gradient measures 6.5 mmHg. Aortic valve peak gradient measures 12.0 mmHg. Aortic valve area, by VTI measures 2.90 cm. Pulmonic Valve: Pulmonic valve regurgitation is not visualized. Aorta: The aortic root and ascending aorta are structurally normal, with no evidence of dilitation. Venous: The inferior vena cava is normal in size with less than 50% respiratory variability, suggesting right atrial pressure of 8 mmHg. IAS/Shunts: No atrial level shunt detected by color flow Doppler.  LEFT VENTRICLE PLAX 2D LVIDd:         3.70 cm   Diastology LVIDs:         2.40 cm   LV e' medial:    3.48 cm/s LV PW:         1.10 cm   LV E/e' medial:  35.6 LV IVS:        1.50 cm   LV e' lateral:   4.90 cm/s LVOT diam:     2.00 cm   LV E/e' lateral: 25.3 LV SV:         95 LV SV Index:   56 LVOT Area:     3.14 cm                           3D Volume EF:                          3D EF:  68 %                          LV EDV:       90 ml                          LV ESV:       29 ml                          LV SV:        61 ml RIGHT VENTRICLE             IVC RV S prime:     14.50 cm/s  IVC diam: 2.10 cm TAPSE (M-mode): 2.6 cm LEFT ATRIUM           Index        RIGHT ATRIUM           Index LA Vol (A2C): 33.5 ml 19.93 ml/m  RA Area:     15.10 cm LA Vol (A4C): 46.6 ml 27.72 ml/m  RA Volume:   38.10 ml  22.66 ml/m  AORTIC VALVE AV Area (Vmax):    2.76 cm AV Area (Vmean):   2.60 cm AV Area (VTI):     2.90 cm AV Vmax:           173.00 cm/s AV Vmean:          117.000 cm/s AV VTI:             0.327 m AV Peak Grad:      12.0 mmHg AV Mean Grad:      6.5 mmHg LVOT Vmax:         152.00 cm/s LVOT Vmean:        97.000 cm/s LVOT VTI:          0.302 m LVOT/AV VTI ratio: 0.92  AORTA Ao Root diam: 2.80 cm Ao Asc diam:  2.70 cm MITRAL VALVE                  TRICUSPID VALVE MV Area (PHT): 2.42 cm       TR Peak grad:   47.1 mmHg MV Area VTI:   1.67 cm       TR Vmax:        343.00 cm/s MV Peak grad:  10.5 mmHg MV Mean grad:  5.0 mmHg       SHUNTS MV Vmax:       1.62 m/s       Systemic VTI:  0.30 m MV Vmean:      103.0 cm/s     Systemic Diam: 2.00 cm MV Decel Time: 313 msec MR Peak grad:    60.8 mmHg MR Mean grad:    37.0 mmHg MR Vmax:         390.00 cm/s MR Vmean:        286.0 cm/s MR PISA:         4.02 cm MR PISA Eff ROA: 41 mm MR PISA Radius:  0.80 cm MV E velocity: 124.00 cm/s MV A velocity: 152.00 cm/s MV E/A ratio:  0.82 [place] signed by Carolan Clines Signature Date/Time: 06/13/2022/4:22:23 PM    Final    CT Lumbar Spine Wo Contrast  Result Date: 06/12/2022 CLINICAL DATA:  Trauma EXAM: CT LUMBAR SPINE WITHOUT CONTRAST TECHNIQUE: Multidetector CT imaging of the lumbar spine was performed  without intravenous contrast administration. Multiplanar CT image reconstructions were also generated. RADIATION DOSE REDUCTION: This exam was performed according to the departmental dose-optimization program which includes automated exposure control, adjustment of the mA and/or kV according to patient size and/or use of iterative reconstruction technique. COMPARISON:  Same day lumbar spine radiograph, lumbar spine CT 09/17/2021 FINDINGS: Segmentation: 5 lumbar type vertebrae. Alignment: Normal. Vertebrae: Redemonstrated is a severe chronic deformity at L4 with progressive height loss along the anterior aspect of the vertebral body compared to 09/17/2021. compared to prior exam there is a new superior endplate compression deformity at the L2 vertebral body level without significant height loss.  Paraspinal and other soft tissues: Aortic atherosclerotic calcification. Disc levels: There is no significant change from prior exam with persistent moderate spinal canal stenosis at L1-L2, severe spinal canal stenosis at L2-L5. there is also severe bilateral neural foraminal stenosis at L3-L4 and L4-L5. IMPRESSION: 1. New superior endplate compression deformity at the L2 vertebral body level without significant height loss. 2. Severe chronic deformity at L4 with progressive height loss along the anterior aspect of the vertebral body compared to 09/17/2021. 3. Unchanged severe spinal canal stenosis at L2-L5 and severe bilateral neural foraminal stenosis at L3-L4 and L4-L5. Aortic Atherosclerosis (ICD10-I70.0). Electronically Signed   By: Lorenza Cambridge M.D.   On: 06/12/2022 17:41   DG Lumbar Spine Complete  Result Date: 06/12/2022 CLINICAL DATA:  Lower back pain EXAM: LUMBAR SPINE - COMPLETE 4+ VIEW COMPARISON:  12/05/2021 FINDINGS: Osseous demineralization. Five non-rib-bearing lumbar vertebra. Marked compression deformity of L4 vertebral body similar to previous study. Mild superior endplate compression deformity of L2 with minimal height loss, new. No additional fracture, subluxation, or bone destruction. Multilevel facet degenerative changes. Minimal levoconvex lumbar scoliosis. SI joints preserved. Atherosclerotic calcifications aorta. IMPRESSION: Chronic L4 compression fracture with minimal superior endplate compression deformity of L2 vertebral body new since 12/05/2021. Aortic Atherosclerosis (ICD10-I70.0). Electronically Signed   By: Ulyses Southward M.D.   On: 06/12/2022 16:13   DG Chest 2 View  Result Date: 06/12/2022 CLINICAL DATA:  Hypoxia, new oxygen requirement EXAM: CHEST - 2 VIEW COMPARISON:  11/03/2018 FINDINGS: Enlargement of cardiac silhouette. Mediastinal contours and pulmonary vascularity normal. Atherosclerotic calcification aorta. Emphysematous changes with chronic accentuation of  interstitial markings unchanged. Small bibasilar pleural effusions. No acute infiltrate or pneumothorax. Bones demineralized with slightly increased compression fracture of a midthoracic vertebra. IMPRESSION: COPD changes with small bibasilar pleural effusions increased versus previous study. Chronic accentuation of interstitial markings without acute infiltrate. Slightly increased compression fracture of a midthoracic vertebra. Aortic Atherosclerosis (ICD10-I70.0) and Emphysema (ICD10-J43.9). Electronically Signed   By: Ulyses Southward M.D.   On: 06/12/2022 16:11       Kathlen Mody M.D. [facility] 06/14/2022, 3:27 PM  Available via Epic secure chat 7am-7pm After 7 pm, please refer to night coverage provider listed on amion.

## 2022-06-14 NOTE — Progress Notes (Signed)
Mobility Specialist Progress Note   06/14/22 1438  Mobility  Activity Transferred from chair to bed  Level of Assistance Minimal assist, patient does 75% or more  Assistive Device Front wheel walker  Distance Ambulated (ft) 2 ft  Activity Response Tolerated well  Mobility Referral Yes  $Mobility charge 1 Mobility   Pt requesting assistance to get from chair to bed d/t increased fatigue and soreness. Required minA on initial rise when standing but CGA for remainder of transfer. Once sitting EOB, minA to donn TLSO, Pt left in supine with all needs met, call bell in reach and bed alarm on.  [place] Mobility Specialist Please contact via SecureChat or  Rehab office at [phone]

## 2022-06-15 DIAGNOSIS — S32020D Wedge compression fracture of second lumbar vertebra, subsequent encounter for fracture with routine healing: Secondary | ICD-10-CM | POA: Diagnosis not present

## 2022-06-15 DIAGNOSIS — I10 Essential (primary) hypertension: Secondary | ICD-10-CM | POA: Diagnosis not present

## 2022-06-15 DIAGNOSIS — F419 Anxiety disorder, unspecified: Secondary | ICD-10-CM | POA: Diagnosis not present

## 2022-06-15 NOTE — Progress Notes (Signed)
[facility]                                                                               Lauren Davidson, is a 89 y.o. female, DOB - [birth year], JYN:[id number] Admit date - 06/12/2022    Outpatient Primary MD for the patient is Thana Ates, MD  LOS - 0  days    Brief summary    Lauren Davidson is a 89 y.o. female with medical history significant for hyperlipidemia, hypertension, chronic back pain, atraumatic L4 compression fracture in May 2023, who initially presented to [facility] ED from independent living facility, with complaints of new onset low back pain over the last few days.    CT of lumbar spine revealed L2 compression fracture. EDP discussed the case with neurosurgery on-call Dr. Maisie Fus who recommended TLSO when out of bed for comfort. The patient was transferred to [facility] MedSurg unit for symptoms management. IR consulted to evaluate for possible kyphoplasty. The patient was accepted by [facility], hospitalist service.  MRI  of the lumbar spine ordered for further evaluation.     Assessment & Plan    Assessment and Plan:   L2 AND  L4 vertebral compression fracture:  MRI of the lumbar spine done and results discussed with family and the patient.  Awaiting IR recommendations on the timing of the kyphoplasty. Awaiting insurance approval.  Therapy eval recommending home health PT.  Discussed about going home and coming back for the surgery when its scheduled.  Patient reports that that she lives at [facility] in the independent section and she is not able to care for herself and she is in tremendous pain with movement and hence rather stay in the hospital and get the procedure done prior to being discharged.   Deconditioning:  Therapy evals recommending HHPT.    Hypertension:  Bp parameters are optimal.    Anxiety:  On lexapro , continue the same.   Chronic back pain:  Pain control.     Estimated body mass index is 24.03 kg/m as  calculated from the following:   Height as of this encounter: 5\' 4"  (1.626 m).   Weight as of this encounter: 63.5 kg.  Code Status: DNR DVT Prophylaxis:  enoxaparin (LOVENOX) injection 40 mg Start: 06/13/22 1000   Level of Care: Level of care: Med-Surg Family Communication: discussed with patient's family on the phone.   Disposition Plan:     Remains inpatient appropriate:   Procedures:  None.   Consultants:   IR  Antimicrobials:   Anti-infectives (From admission, onward)    None        Medications  Scheduled Meds:  artificial tears  1 Application Both Eyes QHS   docusate sodium  100 mg Oral BID   enoxaparin (LOVENOX) injection  40 mg Subcutaneous Daily   escitalopram  10 mg Oral Daily   lidocaine  1 patch Transdermal Q24H   multivitamin with minerals  1 tablet Oral Q lunch   polyvinyl alcohol  1 drop Both Eyes QID   senna-docusate  1 tablet Oral QHS   Continuous Infusions: PRN Meds:.acetaminophen, melatonin, oxyCODONE, polyethylene glycol, prochlorperazine    Subjective:  Lauren Davidson was seen and examined today. She reports pain on movement. Denies any other complaints. Does not want to be discharged .   Objective:   Vitals:   06/14/22 2100 06/15/22 0446 06/15/22 0810 06/15/22 1625  BP: 133/70 (!) 151/88 (!) 161/73 127/63  Pulse: 70 73 73 62  Resp: 18 18 20    Temp: 98.6 F (37 C) 97.8 F (36.6 C) 98.4 F (36.9 C) 98.6 F (37 C)  TempSrc: Axillary Oral Oral Oral  SpO2: 93% 93% 92% 91%  Weight:      Height:        Intake/Output Summary (Last 24 hours) at 06/15/2022 1806 Last data filed at 06/15/2022 1456 Gross per 24 hour  Intake 480 ml  Output 750 ml  Net -270 ml   Filed Weights   06/12/22 1357  Weight: 63.5 kg     Exam General exam: Elderly woman, deconditioned, does not appear to be in any distress Respiratory system: Clear to auscultation. Respiratory effort normal. Cardiovascular system: S1 & S2 heard, RRR. No JVD, No pedal  edema. Gastrointestinal system: Abdomen is nondistended, soft and nontender.  Central nervous system: Alert and oriented. Grossly non focal. Very hard of hearing.  Extremities: Symmetric 5 x 5 power. Skin: No rashes, Psychiatry:  Mood & affect appropriate.      Data Reviewed:  I have personally reviewed following labs and imaging studies   CBC Lab Results  Component Value Date   WBC 8.3 06/13/2022   RBC 3.64 (L) 06/13/2022   HGB 12.8 06/13/2022   HCT 36.9 06/13/2022   MCV 101.4 (H) 06/13/2022   MCH 35.2 (H) 06/13/2022   PLT 133 (L) 06/13/2022   MCHC 34.7 06/13/2022   RDW 13.1 06/13/2022   LYMPHSABS 0.6 (L) 06/12/2022   MONOABS 0.9 06/12/2022   EOSABS 0.0 06/12/2022   BASOSABS 0.0 27/07/5007     Last metabolic panel Lab Results  Component Value Date   NA 134 (L) 06/13/2022   K 4.0 06/13/2022   CL 100 06/13/2022   CO2 25 06/13/2022   BUN 13 06/13/2022   CREATININE 0.76 06/13/2022   GLUCOSE 82 06/13/2022   GFRNONAA >60 06/13/2022   CALCIUM 8.5 (L) 06/13/2022   PHOS 2.7 06/13/2022   PROT 6.2 (L) 06/13/2022   ALBUMIN 3.1 (L) 06/13/2022   BILITOT 1.5 (H) 06/13/2022   ALKPHOS 68 06/13/2022   AST 38 06/13/2022   ALT 23 06/13/2022   ANIONGAP 9 06/13/2022    CBG (last 3)  No results for input(s): "GLUCAP" in the last 72 hours.    Coagulation Profile: No results for input(s): "INR", "PROTIME" in the last 168 hours.   Radiology Studies: MR LUMBAR SPINE WO CONTRAST  Result Date: 06/13/2022 CLINICAL DATA:  Follow-up examination for compression fracture. EXAM: MRI LUMBAR SPINE WITHOUT CONTRAST TECHNIQUE: Multiplanar, multisequence MR imaging of the lumbar spine was performed. No intravenous contrast was administered. COMPARISON:  Prior CT from 06/12/2022. FINDINGS: Segmentation: Standard. Lowest well-formed disc space labeled the L5-S1 level. Alignment: Mild levoscoliosis. Mild grade 1 degenerative stepwise anterolisthesis of L2 on L3 through L4 on L5. Vertebrae:  Acute compression fracture involving the upper-mid L2 vertebral body is seen. Associated height loss measures up to 20% with trace 2 mm bony retropulsion. Compression deformity involving the L4 vertebral body demonstrates an acute to subacute component with associated marrow edema. Associated height loss measures up to 70% with 4 mm bony retropulsion. Otherwise, vertebral body height maintained. Reactive endplate changes at the inferior endplate of  L3 and superior endplate of L5 noted, favored to be degenerative. Underlying bone marrow signal intensity within normal limits. No worrisome osseous lesions. Prominent reactive marrow edema noted about the right greater than left L3-4 and L4-5 facets due to facet arthritis. Conus medullaris and cauda equina: Conus extends to the L1 level. Conus and cauda equina appear normal. Paraspinal and other soft tissues: Paraspinous soft tissues demonstrate no acute finding. 2.2 cm cystic lesion within the left upper quadrant most likely reflects a small gastric diverticulum, also seen on prior CT. Colonic diverticulosis noted. Disc levels: T12-L1: Disc desiccation with mild disc bulge. Mild endplate spurring. Superimposed tiny central disc protrusion with annular fissure. No spinal stenosis. Mild bilateral foraminal narrowing. L1-2: Diffuse disc bulge with disc desiccation. Disc bulging eccentric to the left. Associated mild endplate spurring with trace 2 mm bony retropulsion related to the L2 fracture. Mild facet hypertrophy. Resultant mild left lateral recess stenosis. Central canal remains patent. Foramina remain patent. L2-3: Diffuse disc bulge with disc desiccation. Superimposed broad-based left subarticular to extraforaminal disc protrusion (series 8, image 15). Mild to moderate facet hypertrophy. Resultant mild canal with left lateral recess stenosis. Mild right with moderate left L2 foraminal narrowing. L3-4: Degenerative intervertebral disc space narrowing with diffuse  disc bulge and disc desiccation. Reactive endplate change with up to 4 mm bony retropulsion related to the L4 fracture. Moderate facet and ligament flavum hypertrophy. Resultant severe spinal stenosis with the thecal sac measuring 4-5 mm in AP diameter at its most narrow point. Moderate bilateral L3 foraminal stenosis. L4-5: Disc bulge with disc desiccation and reactive endplate spurring. Moderate facet hypertrophy. Resultant mild canal with bilateral subarticular stenosis. Moderate right worse than left L4 foraminal narrowing. L5-S1: Disc desiccation without significant disc bulge. Mild endplate spurring. Mild facet hypertrophy. No spinal stenosis. Foramina remain patent IMPRESSION: 1. Acute compression fracture involving the L2 vertebral body with up to 20% height loss and trace 2 mm bony retropulsion. 2. Acute to subacute compression fracture involving the L4 vertebral body with up to 70% height loss and 4 mm bony retropulsion. 3. Underlying multilevel degenerative spondylosis and facet arthrosis, most pronounced at L3-4 where there is resultant severe spinal stenosis with moderate bilateral L3 foraminal narrowing. Additional moderate left L2 and bilateral L4 foraminal stenosis. 4. Prominent reactive marrow edema about the right greater than left L3-4 and L4-5 facets due to facet arthritis. Finding could also contribute to lower back pain. Electronically Signed   By: Jeannine Boga M.D.   On: 06/13/2022 20:39       Hosie Poisson M.D. [facility] 06/15/2022, 6:06 PM  Available via Epic secure chat 7am-7pm After 7 pm, please refer to night coverage provider listed on amion.

## 2022-06-15 NOTE — Progress Notes (Signed)
Mobility Specialist Progress Note   06/15/22 1200  Mobility  Activity Ambulated with assistance in room  Level of Assistance Contact guard assist, steadying assist  Assistive Device Front wheel walker  Distance Ambulated (ft) 20 ft  Range of Motion/Exercises Active;All extremities  Activity Response Tolerated well   Patient received in supine and agreeable to participate. Transferred to recliner chair first with min A for bath. After personal care, donned TLSO with TA while seated in recliner. Stood with minimal HHA and ambulated short distance in room with min guard. Distance limited by fatigue and generalized weakness. Returned to recliner without complaint or incident. Was left with all needs met, call bell in reach.    Lauren Davidson, BS EXP Mobility Specialist Please contact via SecureChat or Rehab office at [phone]

## 2022-06-16 DIAGNOSIS — Z885 Allergy status to narcotic agent status: Secondary | ICD-10-CM | POA: Diagnosis not present

## 2022-06-16 DIAGNOSIS — E860 Dehydration: Secondary | ICD-10-CM | POA: Diagnosis not present

## 2022-06-16 DIAGNOSIS — E785 Hyperlipidemia, unspecified: Secondary | ICD-10-CM | POA: Diagnosis not present

## 2022-06-16 DIAGNOSIS — M8008XA Age-related osteoporosis with current pathological fracture, vertebra(e), initial encounter for fracture: Secondary | ICD-10-CM | POA: Diagnosis not present

## 2022-06-16 DIAGNOSIS — M4856XA Collapsed vertebra, not elsewhere classified, lumbar region, initial encounter for fracture: Secondary | ICD-10-CM | POA: Diagnosis not present

## 2022-06-16 DIAGNOSIS — Z1152 Encounter for screening for COVID-19: Secondary | ICD-10-CM | POA: Diagnosis not present

## 2022-06-16 DIAGNOSIS — I10 Essential (primary) hypertension: Secondary | ICD-10-CM | POA: Diagnosis not present

## 2022-06-16 DIAGNOSIS — R9431 Abnormal electrocardiogram [ECG] [EKG]: Secondary | ICD-10-CM | POA: Diagnosis not present

## 2022-06-16 DIAGNOSIS — S32020A Wedge compression fracture of second lumbar vertebra, initial encounter for closed fracture: Secondary | ICD-10-CM

## 2022-06-16 DIAGNOSIS — M898X8 Other specified disorders of bone, other site: Secondary | ICD-10-CM | POA: Diagnosis not present

## 2022-06-16 DIAGNOSIS — M47816 Spondylosis without myelopathy or radiculopathy, lumbar region: Secondary | ICD-10-CM | POA: Diagnosis not present

## 2022-06-16 DIAGNOSIS — D696 Thrombocytopenia, unspecified: Secondary | ICD-10-CM | POA: Diagnosis not present

## 2022-06-16 DIAGNOSIS — S32000A Wedge compression fracture of unspecified lumbar vertebra, initial encounter for closed fracture: Secondary | ICD-10-CM | POA: Diagnosis not present

## 2022-06-16 DIAGNOSIS — K219 Gastro-esophageal reflux disease without esophagitis: Secondary | ICD-10-CM | POA: Diagnosis not present

## 2022-06-16 DIAGNOSIS — E871 Hypo-osmolality and hyponatremia: Secondary | ICD-10-CM | POA: Diagnosis not present

## 2022-06-16 DIAGNOSIS — I129 Hypertensive chronic kidney disease with stage 1 through stage 4 chronic kidney disease, or unspecified chronic kidney disease: Secondary | ICD-10-CM | POA: Diagnosis not present

## 2022-06-16 DIAGNOSIS — S32030A Wedge compression fracture of third lumbar vertebra, initial encounter for closed fracture: Secondary | ICD-10-CM | POA: Diagnosis not present

## 2022-06-16 DIAGNOSIS — F419 Anxiety disorder, unspecified: Secondary | ICD-10-CM | POA: Diagnosis not present

## 2022-06-16 DIAGNOSIS — N182 Chronic kidney disease, stage 2 (mild): Secondary | ICD-10-CM | POA: Diagnosis not present

## 2022-06-16 DIAGNOSIS — Z79899 Other long term (current) drug therapy: Secondary | ICD-10-CM | POA: Diagnosis not present

## 2022-06-16 DIAGNOSIS — Z888 Allergy status to other drugs, medicaments and biological substances status: Secondary | ICD-10-CM | POA: Diagnosis not present

## 2022-06-16 DIAGNOSIS — S32020D Wedge compression fracture of second lumbar vertebra, subsequent encounter for fracture with routine healing: Secondary | ICD-10-CM | POA: Diagnosis not present

## 2022-06-16 DIAGNOSIS — M48061 Spinal stenosis, lumbar region without neurogenic claudication: Secondary | ICD-10-CM | POA: Diagnosis not present

## 2022-06-16 DIAGNOSIS — M3500 Sicca syndrome, unspecified: Secondary | ICD-10-CM | POA: Diagnosis not present

## 2022-06-16 DIAGNOSIS — G8929 Other chronic pain: Secondary | ICD-10-CM | POA: Diagnosis not present

## 2022-06-16 DIAGNOSIS — Z66 Do not resuscitate: Secondary | ICD-10-CM | POA: Diagnosis not present

## 2022-06-16 HISTORY — DX: Wedge compression fracture of second lumbar vertebra, initial encounter for closed fracture: S32.020A

## 2022-06-16 LAB — CBC WITH DIFFERENTIAL/PLATELET
Abs Immature Granulocytes: 0.02 10*3/uL (ref 0.00–0.07)
Basophils Absolute: 0 10*3/uL (ref 0.0–0.1)
Basophils Relative: 1 %
Eosinophils Absolute: 0.2 10*3/uL (ref 0.0–0.5)
Eosinophils Relative: 3 %
HCT: 37.3 % (ref 36.0–46.0)
Hemoglobin: 12.6 g/dL (ref 12.0–15.0)
Immature Granulocytes: 0 %
Lymphocytes Relative: 16 %
Lymphs Abs: 1 10*3/uL (ref 0.7–4.0)
MCH: 34.3 pg — ABNORMAL HIGH (ref 26.0–34.0)
MCHC: 33.8 g/dL (ref 30.0–36.0)
MCV: 101.6 fL — ABNORMAL HIGH (ref 80.0–100.0)
Monocytes Absolute: 1 10*3/uL (ref 0.1–1.0)
Monocytes Relative: 17 %
Neutro Abs: 3.9 10*3/uL (ref 1.7–7.7)
Neutrophils Relative %: 63 %
Platelets: 145 10*3/uL — ABNORMAL LOW (ref 150–400)
RBC: 3.67 MIL/uL — ABNORMAL LOW (ref 3.87–5.11)
RDW: 12.8 % (ref 11.5–15.5)
WBC: 6.1 10*3/uL (ref 4.0–10.5)
nRBC: 0 % (ref 0.0–0.2)

## 2022-06-16 LAB — BASIC METABOLIC PANEL
Anion gap: 8 (ref 5–15)
BUN: 11 mg/dL (ref 8–23)
CO2: 27 mmol/L (ref 22–32)
Calcium: 8.5 mg/dL — ABNORMAL LOW (ref 8.9–10.3)
Chloride: 94 mmol/L — ABNORMAL LOW (ref 98–111)
Creatinine, Ser: 0.71 mg/dL (ref 0.44–1.00)
GFR, Estimated: >60 mL/min (ref >60)
Glucose, Bld: 105 mg/dL — ABNORMAL HIGH (ref 70–99)
Potassium: 4.2 mmol/L (ref 3.5–5.1)
Sodium: 129 mmol/L — ABNORMAL LOW (ref 135–145)

## 2022-06-16 MED ORDER — SODIUM CHLORIDE 0.9 % IV SOLN: INTRAVENOUS | Status: DC

## 2022-06-16 NOTE — Progress Notes (Addendum)
[facility]                                                                               Lauren Davidson, is a 89 y.o. female, DOB - [birth year], KWI:[id number] Admit date - 06/12/2022    Outpatient Primary MD for the patient is Charlane Ferretti, MD  LOS - 0  days    Brief summary    Lauren Davidson is a 89 y.o. female with medical history significant for hyperlipidemia, hypertension, chronic back pain, atraumatic L4 compression fracture in May 2023, who initially presented to [facility] ED from independent living facility, with complaints of new onset low back pain over the last few days.    CT of lumbar spine revealed L2 compression fracture. EDP discussed the case with neurosurgery on-call Dr. Marcello Moores who recommended TLSO when out of bed for comfort. The patient was transferred to [place] unit for symptoms management. IR consulted to evaluate for possible kyphoplasty. The patient was accepted by [facility], hospitalist service.  MRI  of the lumbar spine ordered for further evaluation.  Currently waiting for timing of the kyphoplasty.  Patient is not stable to go home at this time due to increased pain and deconditioning.    Assessment & Plan    Assessment and Plan:   L2 AND  L4 vertebral compression fracture:  MRI of the lumbar spine done and results discussed with family and the patient.  Awaiting IR recommendations on the timing of the kyphoplasty. Awaiting insurance approval.  Therapy eval recommending home health PT.  Discussed about going home and coming back for the surgery when its scheduled.  Patient reports that that she lives at [facility] in the independent section and she is not able to care for herself and she is in tremendous pain with movement and hence rather stay in the hospital and get the procedure done prior to being discharged. Discussed the plan with the patient and family at bedside  Deconditioning:  Therapy evals recommending HHPT.     Hypertension:  Bp parameters are well controlled hold at this time  Anxiety:  On lexapro , continue the same.   Chronic back pain:  Pain control.    Hyponatremia suspect is from dehydration Will start her on IV fluids and recheck sodium in the morning. Check for SIADH, to get TSH, serum osmolality, urine osmolality and urine sodium.    Estimated body mass index is 24.03 kg/m as calculated from the following:   Height as of this encounter: 5\' 4"  (1.626 m).   Weight as of this encounter: 63.5 kg.  Code Status: DNR DVT Prophylaxis:  enoxaparin (LOVENOX) injection 40 mg Start: 06/13/22 1000   Level of Care: Level of care: Med-Surg Family Communication: discussed with patient's family on the phone.   Disposition Plan:     Remains inpatient appropriate: Awaiting kyphoplasty Procedures:  None.   Consultants:   IR  Antimicrobials:   Anti-infectives (From admission, onward)    None        Medications  Scheduled Meds:  artificial tears  1 Application Both Eyes QHS   docusate sodium  100 mg Oral BID   enoxaparin (  LOVENOX) injection  40 mg Subcutaneous Daily   escitalopram  10 mg Oral Daily   lidocaine  1 patch Transdermal Q24H   multivitamin with minerals  1 tablet Oral Q lunch   polyvinyl alcohol  1 drop Both Eyes QID   senna-docusate  1 tablet Oral QHS   Continuous Infusions:  sodium chloride 75 mL/hr at 06/16/22 0944   PRN Meds:.acetaminophen, melatonin, oxyCODONE, polyethylene glycol, prochlorperazine    Subjective:   Lauren Davidson was seen and examined today.  No new complaints this morning other than pain on movement  Objective:   Vitals:   06/15/22 2005 06/16/22 0524 06/16/22 0911 06/16/22 1617  BP: 137/61 133/66 (!) 144/61 (!) 131/49  Pulse: 63 67 69 66  Resp:  20 16 16   Temp: 98.3 F (36.8 C)  98.2 F (36.8 C) 98.7 F (37.1 C)  TempSrc: Oral  Oral Oral  SpO2: 93% 93% 95% 92%  Weight:      Height:        Intake/Output Summary  (Last 24 hours) at 06/16/2022 1629 Last data filed at 06/16/2022 0900 Gross per 24 hour  Intake 630 ml  Output 500 ml  Net 130 ml    Filed Weights   06/12/22 1357  Weight: 63.5 kg     Exam General exam: Elderly woman in the bed not in any kind of distress. Respiratory system: Clear to auscultation. Respiratory effort normal. Cardiovascular system: S1 & S2 heard, RRR. No JVD,  Gastrointestinal system: Abdomen is nondistended, soft and nontender.  Central nervous system: Alert and oriented.  Grossly nonfocal Extremities: Symmetric 5 x 5 power. Skin: No rashes, lesions or ulcers Psychiatry:  Mood & affect appropriate.      Data Reviewed:  I have personally reviewed following labs and imaging studies   CBC Lab Results  Component Value Date   WBC 6.1 06/16/2022   RBC 3.67 (L) 06/16/2022   HGB 12.6 06/16/2022   HCT 37.3 06/16/2022   MCV 101.6 (H) 06/16/2022   MCH 34.3 (H) 06/16/2022   PLT 145 (L) 06/16/2022   MCHC 33.8 06/16/2022   RDW 12.8 06/16/2022   LYMPHSABS 1.0 06/16/2022   MONOABS 1.0 06/16/2022   EOSABS 0.2 06/16/2022   BASOSABS 0.0 35/36/1443     Last metabolic panel Lab Results  Component Value Date   NA 129 (L) 06/16/2022   K 4.2 06/16/2022   CL 94 (L) 06/16/2022   CO2 27 06/16/2022   BUN 11 06/16/2022   CREATININE 0.71 06/16/2022   GLUCOSE 105 (H) 06/16/2022   GFRNONAA >60 06/16/2022   CALCIUM 8.5 (L) 06/16/2022   PHOS 2.7 06/13/2022   PROT 6.2 (L) 06/13/2022   ALBUMIN 3.1 (L) 06/13/2022   BILITOT 1.5 (H) 06/13/2022   ALKPHOS 68 06/13/2022   AST 38 06/13/2022   ALT 23 06/13/2022   ANIONGAP 8 06/16/2022    CBG (last 3)  No results for input(s): "GLUCAP" in the last 72 hours.    Coagulation Profile: No results for input(s): "INR", "PROTIME" in the last 168 hours.   Radiology Studies: No results found.     Hosie Poisson M.D. [facility] 06/16/2022, 4:29 PM  Available via Epic secure chat 7am-7pm After 7 pm, please refer to  night coverage provider listed on amion.

## 2022-06-17 DIAGNOSIS — I10 Essential (primary) hypertension: Secondary | ICD-10-CM | POA: Diagnosis not present

## 2022-06-17 DIAGNOSIS — S32020D Wedge compression fracture of second lumbar vertebra, subsequent encounter for fracture with routine healing: Secondary | ICD-10-CM | POA: Diagnosis not present

## 2022-06-17 DIAGNOSIS — F419 Anxiety disorder, unspecified: Secondary | ICD-10-CM | POA: Diagnosis not present

## 2022-06-17 LAB — BASIC METABOLIC PANEL
Anion gap: 9 (ref 5–15)
BUN: 13 mg/dL (ref 8–23)
CO2: 23 mmol/L (ref 22–32)
Calcium: 8.3 mg/dL — ABNORMAL LOW (ref 8.9–10.3)
Chloride: 99 mmol/L (ref 98–111)
Creatinine, Ser: 0.72 mg/dL (ref 0.44–1.00)
GFR, Estimated: >60 mL/min (ref >60)
Glucose, Bld: 98 mg/dL (ref 70–99)
Potassium: 4.3 mmol/L (ref 3.5–5.1)
Sodium: 131 mmol/L — ABNORMAL LOW (ref 135–145)

## 2022-06-17 LAB — OSMOLALITY: Osmolality: 283 mOsm/kg (ref 275–295)

## 2022-06-17 LAB — CORTISOL: Cortisol, Plasma: 13 ug/dL

## 2022-06-17 LAB — TSH: TSH: 2.859 u[IU]/mL (ref 0.350–4.500)

## 2022-06-17 NOTE — Progress Notes (Signed)
Mobility Specialist Progress Note   06/17/22 1241  Mobility  Activity Ambulated with assistance in room;Transferred from bed to chair  Level of Assistance Moderate assist, patient does 50-74%  Assistive Device Front wheel walker  Distance Ambulated (ft) 16 ft  Activity Response Tolerated fair  Mobility Referral Yes  $Mobility charge 1 Mobility   Received pt in bed c/o R wrist pain but agreeable. MinA to EOB for trunk weakness and increased pain in back. Provided totalA to donn TLSO brace, pt uncomfortable throughout placement. Upon standing, pt stuck in flexed position and requiring ModA to fully stand upright. Ambulation in room limited to increase fatigue and weakness. Left in chair w/ brace off d/t increased uncomfortable feeling, placed call bell in reach, chair alarm on and food tray in front.    Holland Falling Mobility Specialist Please contact via SecureChat or  Rehab office at [phone]

## 2022-06-17 NOTE — Progress Notes (Signed)
[facility]                                                                               Lauren Davidson, is a 89 y.o. female, DOB - 1921-08-15, YBO:[id number] Admit date - 06/12/2022    Outpatient Primary MD for the patient is Charlane Ferretti, MD  LOS - 1  days    Brief summary    Lauren Davidson is a 89 y.o. female with medical history significant for hyperlipidemia, hypertension, chronic back pain, atraumatic L4 compression fracture in May 2023, who initially presented to [facility] ED from independent living facility, with complaints of new onset low back pain over the last few days.    CT of lumbar spine revealed L2 compression fracture. EDP discussed the case with neurosurgery on-call Dr. Marcello Moores who recommended TLSO when out of bed for comfort. The patient was transferred to [place] unit for symptoms management. IR consulted to evaluate for possible kyphoplasty. The patient was accepted by [facility], hospitalist service.  MRI  of the lumbar spine ordered for further evaluation.  Currently waiting for timing of the kyphoplasty.  Patient is not stable to go home at this time due to increased pain and deconditioning.    Assessment & Plan    Assessment and Plan:   L2 AND  L4 vertebral compression fracture:  MRI of the lumbar spine done and results discussed with family and the patient.  Awaiting IR recommendations on the timing of the kyphoplasty. Awaiting insurance approval.  Therapy eval recommending home health PT.  Discussed about going home and coming back for the surgery when its scheduled.  Patient reports that that she lives at [facility] in the independent section and she is not able to care for herself and she is in tremendous pain with movement and hence rather stay in the hospital and get the procedure done prior to being discharged.  Ambulated only 16 feet today with mobility specialist.  Discussed the plan with the patient and family at  bedside  Deconditioning:  Therapy evals recommending HHPT.    Hypertension:  Bp parameters are well controlled hold at this time  Anxiety:  On lexapro , continue the same.   Chronic back pain:  Pain control.    Hyponatremia suspect is from dehydration Started her on IV fluids and sodium has improved to 131.  Osmo is 283, cortisol is 13, TSH WNL.   Estimated body mass index is 24.03 kg/m as calculated from the following:   Height as of this encounter: 5\' 4"  (1.626 m).   Weight as of this encounter: 63.5 kg.  Code Status: DNR DVT Prophylaxis:  enoxaparin (LOVENOX) injection 40 mg Start: 06/13/22 1000   Level of Care: Level of care: Med-Surg Family Communication: discussed with patient's family on the phone.   Disposition Plan:     Remains inpatient appropriate: Awaiting kyphoplasty Procedures:  None.   Consultants:   IR  Antimicrobials:   Anti-infectives (From admission, onward)    None        Medications  Scheduled Meds:  artificial tears  1 Application Both Eyes QHS   docusate sodium  100 mg Oral  BID   enoxaparin (LOVENOX) injection  40 mg Subcutaneous Daily   escitalopram  10 mg Oral Daily   lidocaine  1 patch Transdermal Q24H   multivitamin with minerals  1 tablet Oral Q lunch   polyvinyl alcohol  1 drop Both Eyes QID   senna-docusate  1 tablet Oral QHS   Continuous Infusions:  sodium chloride 75 mL/hr at 06/17/22 0123   PRN Meds:.acetaminophen, melatonin, oxyCODONE, polyethylene glycol, prochlorperazine    Subjective:   Lauren Davidson was seen and examined today.  Reports feeling weak.   Objective:   Vitals:   06/16/22 0911 06/16/22 1617 06/16/22 1935 06/17/22 0731  BP: (!) 144/61 (!) 131/49 (!) 109/54 134/60  Pulse: 69 66  67  Resp: 16 16 18 17   Temp: 98.2 F (36.8 C) 98.7 F (37.1 C) 98.6 F (37 C) 99.3 F (37.4 C)  TempSrc: Oral Oral Oral Oral  SpO2: 95% 92% 93% 90%  Weight:      Height:        Intake/Output Summary (Last  24 hours) at 06/17/2022 1348 Last data filed at 06/17/2022 0600 Gross per 24 hour  Intake 851.59 ml  Output 650 ml  Net 201.59 ml    Filed Weights   06/12/22 1357  Weight: 63.5 kg     Exam General exam: Appears calm and comfortable  Respiratory system: Clear to auscultation. Respiratory effort normal. Cardiovascular system: S1 & S2 heard, RRR. No JVD, murmurs, Gastrointestinal system: Abdomen is nondistended, soft and nontender.  Central nervous system: Alert and oriented. No focal neurological deficits. Extremities: Symmetric 5 x 5 power. Skin: No rashes,  Psychiatry:  Mood & affect appropriate.       Data Reviewed:  I have personally reviewed following labs and imaging studies   CBC Lab Results  Component Value Date   WBC 6.1 06/16/2022   RBC 3.67 (L) 06/16/2022   HGB 12.6 06/16/2022   HCT 37.3 06/16/2022   MCV 101.6 (H) 06/16/2022   MCH 34.3 (H) 06/16/2022   PLT 145 (L) 06/16/2022   MCHC 33.8 06/16/2022   RDW 12.8 06/16/2022   LYMPHSABS 1.0 06/16/2022   MONOABS 1.0 06/16/2022   EOSABS 0.2 06/16/2022   BASOSABS 0.0 40/12/6759     Last metabolic panel Lab Results  Component Value Date   NA 131 (L) 06/17/2022   K 4.3 06/17/2022   CL 99 06/17/2022   CO2 23 06/17/2022   BUN 13 06/17/2022   CREATININE 0.72 06/17/2022   GLUCOSE 98 06/17/2022   GFRNONAA >60 06/17/2022   CALCIUM 8.3 (L) 06/17/2022   PHOS 2.7 06/13/2022   PROT 6.2 (L) 06/13/2022   ALBUMIN 3.1 (L) 06/13/2022   BILITOT 1.5 (H) 06/13/2022   ALKPHOS 68 06/13/2022   AST 38 06/13/2022   ALT 23 06/13/2022   ANIONGAP 9 06/17/2022    CBG (last 3)  No results for input(s): "GLUCAP" in the last 72 hours.    Coagulation Profile: No results for input(s): "INR", "PROTIME" in the last 168 hours.   Radiology Studies: No results found.     Hosie Poisson M.D. [facility] 06/17/2022, 1:48 PM  Available via Epic secure chat 7am-7pm After 7 pm, please refer to night coverage provider  listed on amion.

## 2022-06-17 NOTE — Plan of Care (Signed)

## 2022-06-17 NOTE — Progress Notes (Signed)
Occupational Therapy Treatment Patient Details Name: Lauren Davidson MRN: [id number] DOB: 1921-11-27 Today's Date: 06/17/2022   History of present illness Pt is a 89 y/o female who presents from independent living facility with new onset low back pain. Pt denies recent fall or trauma. CT revealed L2 compression fracture. TLSO recommended for comfort. No significant PMH noted in chart.   OT comments  Pt continues to be limited by back pain. Pt wanting to bathe and dress with OT, but unable to tolerate movement. RN notified of pt's request for pain meds. Completed seated grooming. Stood x 1 with increased time and min assist. Pt choosing to remain up in chair with TLSO doffed. Set up to eat ice cream.   Recommendations for follow up therapy are one component of a multi-disciplinary discharge planning process, led by the attending physician.  Recommendations may be updated based on patient status, additional functional criteria and insurance authorization.    Follow Up Recommendations  Home health OT     Assistance Recommended at Discharge PRN  Patient can return home with the following  A little help with walking and/or transfers;A lot of help with bathing/dressing/bathroom;Assistance with cooking/housework;Assist for transportation;Help with stairs or ramp for entrance   Equipment Recommendations  BSC/3in1    Recommendations for Other Services      Precautions / Restrictions Precautions Precautions: Fall;Back Precaution Comments: pt able to state back precautions Required Braces or Orthoses: Spinal Brace Spinal Brace: Thoracolumbosacral orthotic;Applied in sitting position (for comfort)       Mobility Bed Mobility               General bed mobility comments: in chair on arrival    Transfers Overall transfer level: Needs assistance Equipment used: Rolling walker (2 wheels) Transfers: Sit to/from Stand Sit to Stand: Min assist           General transfer comment:  increased time, assist to rise and steady, heavy reliance on UEs     Balance Overall balance assessment: Needs assistance Sitting-balance support: No upper extremity supported, Feet supported Sitting balance-Leahy Scale: Fair     Standing balance support: Bilateral upper extremity supported, During functional activity, Reliant on assistive device for balance Standing balance-Leahy Scale: Poor                             ADL either performed or assessed with clinical judgement   ADL Overall ADL's : Needs assistance/impaired Eating/Feeding: Set up;Sitting   Grooming: Wash/dry hands;Wash/dry face;Sitting;Set up                                      Extremity/Trunk Assessment              Vision       Perception     Praxis      Cognition Arousal/Alertness: Awake/alert Behavior During Therapy: WFL for tasks assessed/performed Overall Cognitive Status: Difficult to assess                                 General Comments: Appears to be [facility]. Hearding aids are both in. Pt did require some repeating of questions due to hearing.        Exercises      Shoulder Instructions       General Comments  Pertinent Vitals/ Pain       Pain Assessment Pain Assessment: Faces Faces Pain Scale: Hurts even more Pain Location: back Pain Descriptors / Indicators: Grimacing, Guarding, Discomfort Pain Intervention(s): Repositioned, Patient requesting pain meds-RN notified  Home Living                                          Prior Functioning/Environment              Frequency  Min 2X/week        Progress Toward Goals  OT Goals(current goals can now be found in the care plan section)  Progress towards OT goals: Progressing toward goals  Acute Rehab OT Goals OT Goal Formulation: With patient Time For Goal Achievement: 06/28/22 Potential to Achieve Goals: Good  Plan Discharge plan remains appropriate     Co-evaluation                 AM-PAC OT "6 Clicks" Daily Activity     Outcome Measure   Help from another person eating meals?: None Help from another person taking care of personal grooming?: A Little Help from another person toileting, which includes using toliet, bedpan, or urinal?: A Little Help from another person bathing (including washing, rinsing, drying)?: A Little Help from another person to put on and taking off regular upper body clothing?: A Little Help from another person to put on and taking off regular lower body clothing?: A Little 6 Click Score: 19    End of Session Equipment Utilized During Treatment: Rolling walker (2 wheels)  OT Visit Diagnosis: Muscle weakness (generalized) (M62.81);Repeated falls (R29.6)   Activity Tolerance Patient limited by pain   Patient Left in chair;with call bell/phone within reach;with chair alarm set   Nurse Communication Patient requests pain meds        Time: 1400-1425 OT Time Calculation (min): 25 min  Charges: OT General Charges $OT Visit: 1 Visit OT Treatments $Self Care/Home Management : 23-37 mins  Cleta Alberts, OTR/L Acute Rehabilitation Services Office: [phone]   Malka So 06/17/2022, 2:25 PM

## 2022-06-17 NOTE — Progress Notes (Signed)
Mobility Specialist Progress Note   06/17/22 1619  Mobility  Activity Transferred from chair to bed  Level of Assistance Minimal assist, patient does 75% or more  Assistive Device Front wheel walker  Distance Ambulated (ft) 4 ft  Activity Response Tolerated well  Mobility Referral Yes  $Mobility charge 1 Mobility   Pt requesting assistance to get from chair to bed d/t increase in pain in lower back. Required MinA to rise from chair w/ steady use of RW while adhereing to cues well. Pt able to get in bed w/ standby assist. Left w/ all needs met, call bell in reach and bed alarm on. RN notified about pain.   Holland Falling Mobility Specialist Please contact via SecureChat or  Rehab office at [phone]

## 2022-06-17 NOTE — Progress Notes (Signed)
Physical Therapy Treatment Patient Details Name: Lauren Davidson MRN: [id number] DOB: Dec 09, 1921 Today's Date: 06/17/2022   History of Present Illness Pt is a 89 y/o female who presents from independent living facility with new onset low back pain. Pt denies recent fall or trauma. CT revealed L2 compression fracture. TLSO recommended for comfort.  PMH: hyperlipidemia, hypertension, chronic back pain, atraumatic L4 compression fracture in May 2023.    PT Comments    Pt received in supine, agreeable limited bed-level session and c/o fatigue after recently sitting up in chair and working with mobility specialist and OT earlier in the day. Pt needing up to modA when rolling due to pain/fatigue and needed +2 modA for long sitting in bed while pillows/ice pack adjusted behind her. Pt defers standing due to fatigue. Pt given handouts for back precautions and BLE HEP at bed level. Plan to work on transfers/gait trial with chair follow next session pending tolerance. Pt continues to benefit from PT services to progress toward functional mobility goals.    Recommendations for follow up therapy are one component of a multi-disciplinary discharge planning process, led by the attending physician.  Recommendations may be updated based on patient status, additional functional criteria and insurance authorization.  Follow Up Recommendations  Home health PT     Assistance Recommended at Discharge Frequent or constant Supervision/Assistance  Patient can return home with the following A little help with walking and/or transfers;A little help with bathing/dressing/bathroom;Assistance with cooking/housework;Assist for transportation;Help with stairs or ramp for entrance   Equipment Recommendations  Rolling walker (2 wheels)    Recommendations for Other Services       Precautions / Restrictions Precautions Precautions: Fall;Back Precaution Booklet Issued: Yes (comment) Precaution Comments: handout given to  reinforce Required Braces or Orthoses: Spinal Brace Spinal Brace: Thoracolumbosacral orthotic;Applied in sitting position (for comfort) Restrictions Weight Bearing Restrictions: No     Mobility  Bed Mobility Overal bed mobility: Needs Assistance Bed Mobility: Rolling Rolling: Min assist, Mod assist         General bed mobility comments: pt refusing EOB/OOB as she recently returned to supine, she did roll to each direction x2 reps for hygiene assist and removal of wet bed pad. pt needing increased assist up to modA due to pain/fatigue.    Transfers Overall transfer level: Needs assistance                 General transfer comment: pt defers due to pain, wanting to eat dinner    Ambulation/Gait                   Stairs             Wheelchair Mobility    Modified Rankin (Stroke Patients Only)       Balance Overall balance assessment: Needs assistance Sitting-balance support: No upper extremity supported, Feet supported Sitting balance-Leahy Scale: Fair     Standing balance support: Bilateral upper extremity supported, During functional activity, Reliant on assistive device for balance Standing balance-Leahy Scale: Poor Standing balance comment: attempted long sitting in bed, pt needed +2 modA due to LBP while pillows readjusted behind her and while ice pack placed                            Cognition Arousal/Alertness: Awake/alert Behavior During Therapy: WFL for tasks assessed/performed Overall Cognitive Status: Difficult to assess  General Comments: Appears to be [facility]. Hearding aids are both in. Pt did require some repeating of questions due to hearing, needs therapist to speak very slowly. Pt asking for handout to reinforce HEP instruction.        Exercises General Exercises - Lower Extremity Ankle Circles/Pumps: AROM, Both, 10 reps, Supine Quad Sets: AROM, Both, 10 reps,  Supine Gluteal Sets: AROM, 5 reps, Supine Short Arc Quad: AROM, Both, 5 reps, Supine    General Comments General comments (skin integrity, edema, etc.): Pt bed pad noted to be wet, pt had not notified Conservation officer, historic buildings. Pt agreeable to roll for replacement of bed pad but refusing to stand at bedside while this was changed due to pain/fatigue.      Pertinent Vitals/Pain Pain Assessment Pain Assessment: Faces Faces Pain Scale: Hurts whole lot Pain Location: back Pain Descriptors / Indicators: Grimacing, Guarding, Discomfort, Moaning Pain Intervention(s): Limited activity within patient's tolerance, Monitored during session, Repositioned, Patient requesting pain meds-RN notified, Ice applied     PT Goals (current goals can now be found in the care plan section) Acute Rehab PT Goals Patient Stated Goal: Be able to go back to her independent living apartment. PT Goal Formulation: With patient/family Time For Goal Achievement: 06/27/22 Progress towards PT goals: Progressing toward goals (slowly)    Frequency    Min 5X/week      PT Plan Current plan remains appropriate       AM-PAC PT "6 Clicks" Mobility   Outcome Measure  Help needed turning from your back to your side while in a flat bed without using bedrails?: A Lot (due to pain for this and all items below) Help needed moving from lying on your back to sitting on the side of a flat bed without using bedrails?: A Lot Help needed moving to and from a bed to a chair (including a wheelchair)?: A Lot Help needed standing up from a chair using your arms (e.g., wheelchair or bedside chair)?: A Lot Help needed to walk in hospital room?: Total Help needed climbing 3-5 steps with a railing? : Total 6 Click Score: 10    End of Session   Activity Tolerance: Patient limited by pain;Patient limited by fatigue Patient left: in bed;with call bell/phone within reach;with bed alarm set;Other (comment) (bed in partial chair posture, heels  floated, RN bringing pain meds to her room) Nurse Communication: Patient requests pain meds;Precautions;Other (comment) (bed pad wet (RN assisted PTA for pt hygiene needs)) PT Visit Diagnosis: Unsteadiness on feet (R26.81);Pain;Difficulty in walking, not elsewhere classified (R26.2) Pain - part of body:  (low back pain)     Time: 1478-2956 PT Time Calculation (min) (ACUTE ONLY): 32 min  Charges:  $Therapeutic Exercise: 8-22 mins $Therapeutic Activity: 8-22 mins                     Kwali Wrinkle P., PTA Acute Rehabilitation Services Secure Chat Preferred 9a-5:30pm Office: [place] 06/17/2022, 5:41 PM

## 2022-06-18 DIAGNOSIS — I10 Essential (primary) hypertension: Secondary | ICD-10-CM | POA: Diagnosis not present

## 2022-06-18 DIAGNOSIS — S32020D Wedge compression fracture of second lumbar vertebra, subsequent encounter for fracture with routine healing: Secondary | ICD-10-CM | POA: Diagnosis not present

## 2022-06-18 DIAGNOSIS — F419 Anxiety disorder, unspecified: Secondary | ICD-10-CM | POA: Diagnosis not present

## 2022-06-18 MED ORDER — CEFAZOLIN SODIUM-DEXTROSE 2-4 GM/100ML-% IV SOLN: 2.0000 g | INTRAVENOUS | Status: AC

## 2022-06-18 NOTE — Progress Notes (Signed)
Physical Therapy Treatment Patient Details Name: Lauren Davidson MRN: [id number] DOB: 13-Jul-1921 Today's Date: 06/18/2022   History of Present Illness Pt is a 89 y/o female who presents from independent living facility with new onset low back pain. Pt denies recent fall or trauma. CT revealed L2 compression fracture. TLSO recommended for comfort.  PMH: hyperlipidemia, hypertension, chronic back pain, atraumatic L4 compression fracture in May 2023.    PT Comments    Pt was received in supine and agreeable to therapy with encouragement. PTA reviewed LE HEP exercises and pt able to demonstrated with cues for technique. Pt required assistance donning/doffing TLSO before and after gait trial. Pt required increased assistance for transfers and gait, up to max +2, this session due to pain and fatigue. Pt attempted stand to sit from RW to raised EOB to simulate getting into an SUV, however required increased assistance to scoot backwards and will need to practice again before discharge. Pt continues to benefit from PT services to progress toward functional mobility goals.     Recommendations for follow up therapy are one component of a multi-disciplinary discharge planning process, led by the attending physician.  Recommendations may be updated based on patient status, additional functional criteria and insurance authorization.  Follow Up Recommendations  Home health PT     Assistance Recommended at Discharge Frequent or constant Supervision/Assistance  Patient can return home with the following A little help with walking and/or transfers;A little help with bathing/dressing/bathroom;Assistance with cooking/housework;Assist for transportation;Help with stairs or ramp for entrance   Equipment Recommendations  Rolling walker (2 wheels)    Recommendations for Other Services       Precautions / Restrictions Precautions Precautions: Fall;Back Precaution Booklet Issued: Yes (comment) Precaution  Comments: handout given to reinforce Required Braces or Orthoses: Spinal Brace Spinal Brace: Thoracolumbosacral orthotic;Applied in sitting position Restrictions Weight Bearing Restrictions: No     Mobility  Bed Mobility Overal bed mobility: Needs Assistance Bed Mobility: Rolling, Sit to Supine Rolling: Min assist Sidelying to sit: Min assist, +2 for safety/equipment   Sit to supine: Min assist, +2 for safety/equipment   General bed mobility comments: Pt required up to min A (+2 for safety) due to pain and weakness    Transfers Overall transfer level: Needs assistance Equipment used: Rolling walker (2 wheels) Transfers: Sit to/from Stand Sit to Stand: Max assist, Mod assist, +2 physical assistance           General transfer comment: Pt initially required max +2 to stand due to pain and progressed to mod +2 during the second trial    Ambulation/Gait Ambulation/Gait assistance: Min assist Gait Distance (Feet): 12 Feet Assistive device: Rolling walker (2 wheels) Gait Pattern/deviations: Decreased stride length, Trunk flexed, Step-to pattern Gait velocity: decreased     General Gait Details: Pt able to take lateral and forward steps in room, however deferred further ambulation secondary to pain and fatigue. Pt required intermittent assistance with RW placement and cues for upright posture.      Balance Overall balance assessment: Needs assistance Sitting-balance support: Single extremity supported Sitting balance-Leahy Scale: Fair Sitting balance - Comments: sitting EOB   Standing balance support: Bilateral upper extremity supported, During functional activity, Reliant on assistive device for balance Standing balance-Leahy Scale: Poor Standing balance comment: with RW support                            Cognition Arousal/Alertness: Awake/alert Behavior During Therapy: WFL for tasks  assessed/performed Overall Cognitive Status: Difficult to assess                                  General Comments: Pt able to follow simple commands, but had trouble if multiple people were talking at once due to hearing impairment.        Exercises General Exercises - Lower Extremity Ankle Circles/Pumps: AROM, Both, 10 reps, Supine Quad Sets: AROM, Both, Supine, 5 reps Gluteal Sets: AROM, 5 reps, Supine Short Arc Quad: AROM, Both, 5 reps, Supine Heel Slides: AROM, Supine, Both, 5 reps Hip ABduction/ADduction: AROM, Both, 5 reps, Supine    General Comments        Pertinent Vitals/Pain Pain Assessment Pain Assessment: Faces Faces Pain Scale: Hurts whole lot Pain Location: back Pain Descriptors / Indicators: Grimacing, Guarding, Discomfort, Moaning Pain Intervention(s): Limited activity within patient's tolerance, Monitored during session, Repositioned     PT Goals (current goals can now be found in the care plan section) Acute Rehab PT Goals Patient Stated Goal: Be able to go back to her independent living apartment. PT Goal Formulation: With patient/family Time For Goal Achievement: 06/27/22 Potential to Achieve Goals: Good Progress towards PT goals: Progressing toward goals    Frequency    Min 5X/week      PT Plan Current plan remains appropriate       AM-PAC PT "6 Clicks" Mobility   Outcome Measure  Help needed turning from your back to your side while in a flat bed without using bedrails?: A Little Help needed moving from lying on your back to sitting on the side of a flat bed without using bedrails?: A Lot Help needed moving to and from a bed to a chair (including a wheelchair)?: A Lot Help needed standing up from a chair using your arms (e.g., wheelchair or bedside chair)?: A Lot Help needed to walk in hospital room?: Total Help needed climbing 3-5 steps with a railing? : Total 6 Click Score: 11    End of Session Equipment Utilized During Treatment: Gait belt;Back brace Activity Tolerance: Patient limited by  pain;Patient limited by fatigue Patient left: in bed;with call bell/phone within reach;Other (comment);with family/visitor present   PT Visit Diagnosis: Unsteadiness on feet (R26.81);Pain;Difficulty in walking, not elsewhere classified (R26.2)     Time: 0254-2706 PT Time Calculation (min) (ACUTE ONLY): 46 min  Charges:  $Gait Training: 8-22 mins $Therapeutic Exercise: 8-22 mins $Therapeutic Activity: 8-22 mins                     Michelle Nasuti, PTA Acute Rehabilitation Services Secure Chat Preferred  Office:([phone]) [phone]    Michelle Nasuti 06/18/2022, 4:55 PM

## 2022-06-18 NOTE — Progress Notes (Signed)
Mobility Specialist Progress Note   06/18/22 1224  Mobility  Activity Transferred to/from [facility]  Level of Assistance Moderate assist, patient does 50-74%  Assistive Device Front wheel walker  Distance Ambulated (ft) 4 ft  Activity Response Tolerated well  Mobility Referral Yes  $Mobility charge 1 Mobility   Pt requiring assistance to [facility] but not realizing they already soiled self. Stand by A to EOB, total assist to donn TLSO and ModA to stand. Transferred w/o fault, left on [facility] w/ RN aware and call bell in reach.    Holland Falling Mobility Specialist Please contact via SecureChat or  Rehab office at [phone]

## 2022-06-18 NOTE — Consult Note (Signed)
Chief Complaint: Patient was seen in consultation today for atraumatic compression fractures   Supervising Physician: Baldemar Lenis  Patient Status: [facility] - In-pt  History of Present Illness: Lauren Davidson is a 89 y.o. female with medical history significant for hyperlipidemia, hypertension, Sjogren's with significant dry mouth, chronic back pain, and atraumatic L4 compression fracture in May 2023, who initially presented to [facility] ED from independent living facility, with complaints of new onset low back pain for greater than a week. She is not able to care for herself and she is in tremendous pain. IR consulted for possible kyphoplasty   MRI on 06/13/22:  1. Acute compression fracture involving the L2 vertebral body with up to 20% height loss and trace 2 mm bony retropulsion. 2. Acute to subacute compression fracture involving the L4 vertebral body with up to 70% height loss and 4 mm bony retropulsion.  Imaging reviewed by Dr. Irwin Brakeman who approves L2 and L4 Kyphoplasty.  Insurance authorization gained on 06/18/22.  Patient sitting in bedside chair wearing TLSO brace and family friend, Kennon Rounds, in room.  Past Medical History:  Diagnosis Date   Hyperlipidemia     History reviewed. No pertinent surgical history.  Allergies: Statins, Buprenorphine hcl, Buspirone, Lexapro [escitalopram], Lovastatin, Meperidine, and Morphine and related  Medications: Prior to Admission medications   Medication Sig Start Date End Date Taking? Authorizing Provider  acetaminophen (TYLENOL) 500 MG tablet Take 500 mg by mouth every 6 (six) hours as needed (for pain).   Yes [provider]  Calcium Carb-Cholecalciferol (CALCIUM+D3 PO) Take 1 tablet by mouth daily with breakfast.   Yes [provider]  docusate sodium (COLACE) 100 MG capsule Take 1 capsule (100 mg total) by mouth 2 (two) times daily. 09/20/21  Yes Jerald Kief, MD  escitalopram  (LEXAPRO) 10 MG tablet Take 10 mg by mouth daily.   Yes [provider]  fish oil-omega-3 fatty acids 1000 MG capsule Take 1,000 mg by mouth 2 (two) times daily after a meal.   Yes [provider]  Multiple Vitamins-Minerals (MULTIVITAMIN WITH MINERALS) tablet Take 1 tablet by mouth daily with lunch.   Yes [provider]  Multiple Vitamins-Minerals (PRESERVISION AREDS 2) CAPS Take 1 capsule by mouth in the morning and at bedtime.   Yes [provider]  SYSTANE OVERNIGHT THERAPY 0.3 % GEL ophthalmic ointment Place 1 application. into both eyes at bedtime.   Yes [provider]  SYSTANE ULTRA PF 0.4-0.3 % SOLN Place 1 drop into both eyes 4 (four) times daily.   Yes [provider]     History reviewed. No pertinent family history.  Social History   Socioeconomic History   Marital status: Divorced    Spouse name: Not on file   Number of children: Not on file   Years of education: Not on file   Highest education level: Not on file  Occupational History   Not on file  Tobacco Use   Smoking status: Never   Smokeless tobacco: Never  Vaping Use   Vaping Use: Never used  Substance and Sexual Activity   Alcohol use: Never   Drug use: Never   Sexual activity: Not on file  Other Topics Concern   Not on file  Social History Narrative   Not on file   Social Determinants of Health   Financial Resource Strain: Not on file  Food Insecurity: No Food Insecurity (06/13/2022)   Hunger Vital Sign    Worried  About Running Out of Food in the Last Year: Never true    Ran Out of Food in the Last Year: Never true  Transportation Needs: No Transportation Needs (06/13/2022)   PRAPARE - Hydrologist (Medical): No    Lack of Transportation (Non-Medical): No  Physical Activity: Not on file  Stress: Not on file  Social Connections: Not on file    Review of Systems: A 12 point ROS discussed and pertinent positives are  indicated in the HPI above.  All other systems are negative.  Review of Systems  Constitutional:  Positive for activity change.  HENT: Negative.    Eyes: Negative.   Respiratory: Negative.    Cardiovascular: Negative.   Gastrointestinal: Negative.   Musculoskeletal:  Positive for back pain.  All other systems reviewed and are negative.   Vital Signs: BP (!) 141/88 (BP Location: Right Arm)   Pulse 63   Temp 98.7 F (37.1 C)   Resp 18   Ht 5\' 4"  (1.626 m)   Wt 139 lb 15.9 oz (63.5 kg)   SpO2 95%   BMI 24.03 kg/m   Physical Exam Vitals reviewed.  Constitutional:      General: She is not in acute distress.    Appearance: She is not toxic-appearing.  HENT:     Head: Normocephalic and atraumatic.     Mouth/Throat:     Mouth: Mucous membranes are dry.     Pharynx: Oropharynx is clear.  Eyes:     General: No scleral icterus.    Extraocular Movements: Extraocular movements intact.  Cardiovascular:     Rate and Rhythm: Normal rate.     Pulses: Normal pulses.  Pulmonary:     Effort: Pulmonary effort is normal. No respiratory distress.  Abdominal:     General: Abdomen is flat.     Palpations: Abdomen is soft.  Musculoskeletal:        General: Tenderness present.  Skin:    General: Skin is warm and dry.  Neurological:     General: No focal deficit present.     Mental Status: She is alert and oriented to person, place, and time.     Motor: Weakness present.  Psychiatric:        Mood and Affect: Mood normal.        Behavior: Behavior normal.     Imaging: MR LUMBAR SPINE WO CONTRAST  Result Date: 06/13/2022 CLINICAL DATA:  Follow-up examination for compression fracture. EXAM: MRI LUMBAR SPINE WITHOUT CONTRAST TECHNIQUE: Multiplanar, multisequence MR imaging of the lumbar spine was performed. No intravenous contrast was administered. COMPARISON:  Prior CT from 06/12/2022. FINDINGS: Segmentation: Standard. Lowest well-formed disc space labeled the L5-S1 level. Alignment:  Mild levoscoliosis. Mild grade 1 degenerative stepwise anterolisthesis of L2 on L3 through L4 on L5. Vertebrae: Acute compression fracture involving the upper-mid L2 vertebral body is seen. Associated height loss measures up to 20% with trace 2 mm bony retropulsion. Compression deformity involving the L4 vertebral body demonstrates an acute to subacute component with associated marrow edema. Associated height loss measures up to 70% with 4 mm bony retropulsion. Otherwise, vertebral body height maintained. Reactive endplate changes at the inferior endplate of L3 and superior endplate of L5 noted, favored to be degenerative. Underlying bone marrow signal intensity within normal limits. No worrisome osseous lesions. Prominent reactive marrow edema noted about the right greater than left L3-4 and L4-5 facets due to facet arthritis. Conus medullaris and cauda equina: Conus extends to  the L1 level. Conus and cauda equina appear normal. Paraspinal and other soft tissues: Paraspinous soft tissues demonstrate no acute finding. 2.2 cm cystic lesion within the left upper quadrant most likely reflects a small gastric diverticulum, also seen on prior CT. Colonic diverticulosis noted. Disc levels: T12-L1: Disc desiccation with mild disc bulge. Mild endplate spurring. Superimposed tiny central disc protrusion with annular fissure. No spinal stenosis. Mild bilateral foraminal narrowing. L1-2: Diffuse disc bulge with disc desiccation. Disc bulging eccentric to the left. Associated mild endplate spurring with trace 2 mm bony retropulsion related to the L2 fracture. Mild facet hypertrophy. Resultant mild left lateral recess stenosis. Central canal remains patent. Foramina remain patent. L2-3: Diffuse disc bulge with disc desiccation. Superimposed broad-based left subarticular to extraforaminal disc protrusion (series 8, image 15). Mild to moderate facet hypertrophy. Resultant mild canal with left lateral recess stenosis. Mild right  with moderate left L2 foraminal narrowing. L3-4: Degenerative intervertebral disc space narrowing with diffuse disc bulge and disc desiccation. Reactive endplate change with up to 4 mm bony retropulsion related to the L4 fracture. Moderate facet and ligament flavum hypertrophy. Resultant severe spinal stenosis with the thecal sac measuring 4-5 mm in AP diameter at its most narrow point. Moderate bilateral L3 foraminal stenosis. L4-5: Disc bulge with disc desiccation and reactive endplate spurring. Moderate facet hypertrophy. Resultant mild canal with bilateral subarticular stenosis. Moderate right worse than left L4 foraminal narrowing. L5-S1: Disc desiccation without significant disc bulge. Mild endplate spurring. Mild facet hypertrophy. No spinal stenosis. Foramina remain patent IMPRESSION: 1. Acute compression fracture involving the L2 vertebral body with up to 20% height loss and trace 2 mm bony retropulsion. 2. Acute to subacute compression fracture involving the L4 vertebral body with up to 70% height loss and 4 mm bony retropulsion. 3. Underlying multilevel degenerative spondylosis and facet arthrosis, most pronounced at L3-4 where there is resultant severe spinal stenosis with moderate bilateral L3 foraminal narrowing. Additional moderate left L2 and bilateral L4 foraminal stenosis. 4. Prominent reactive marrow edema about the right greater than left L3-4 and L4-5 facets due to facet arthritis. Finding could also contribute to lower back pain. Electronically Signed   By: Rise Mu M.D.   On: 06/13/2022 20:39   ECHOCARDIOGRAM COMPLETE  Result Date: 06/13/2022    ECHOCARDIOGRAM REPORT   Patient Name:   MILINA PAGETT Date of Exam: 06/13/2022 Medical Rec #:  [id number]      Height:       64.0 in Accession #:    [id number]     Weight:       140.0 lb Date of Birth:  [birth year]      BSA:          1.681 m Patient Age:    89 years       BP:           144/61 mmHg Patient Gender: F              HR:            79 bpm. Exam Location:  Inpatient Procedure: 2D Echo, 3D Echo, Color Doppler and Cardiac Doppler Indications:    I50.40* Unspecified combined systolic (congestive) and diastolic                 (congestive) heart failure  History:        Patient has no prior history of Echocardiogram examinations.  Mitral Valve Prolapse and Mitral Valve Disease; Risk                 Factors:Hypertension and Dyslipidemia.  Sonographer:    Roseanna Rainbow RDCS Referring Phys: Kayleen Memos  Sonographer Comments: Technically difficult study due to poor echo windows. IMPRESSIONS  1. Left ventricular ejection fraction, by estimation, is 60 to 65%. The left ventricle has normal function. The left ventricle has no regional wall motion abnormalities. There is moderate asymmetric left ventricular hypertrophy of the basal-septal segment. Left ventricular diastolic parameters are consistent with Grade I diastolic dysfunction (impaired relaxation). There is the interventricular septum is flattened in systole and diastole, consistent with right ventricular pressure and volume overload.  2. Right ventricular systolic function is normal. The right ventricular size is severely enlarged. There is moderately elevated pulmonary artery systolic pressure. The estimated right ventricular systolic pressure is 31.5 mmHg.  3. HR 79 bpm. The mitral valve is degenerative. Mild to moderate mitral valve regurgitation. Mild mitral stenosis. The mean mitral valve gradient is 5.0 mmHg. Severe mitral annular calcification.  4. Tricuspid valve regurgitation is moderate.  5. The aortic valve was not well visualized. Aortic valve regurgitation is not visualized.  6. The inferior vena cava is normal in size with <50% respiratory variability, suggesting right atrial pressure of 8 mmHg. FINDINGS  Left Ventricle: Left ventricular ejection fraction, by estimation, is 60 to 65%. The left ventricle has normal function. The left ventricle has no regional wall  motion abnormalities. The left ventricular internal cavity size was normal in size. There is  moderate asymmetric left ventricular hypertrophy of the basal-septal segment. The interventricular septum is flattened in systole and diastole, consistent with right ventricular pressure and volume overload. Left ventricular diastolic parameters are consistent with Grade I diastolic dysfunction (impaired relaxation). Right Ventricle: The right ventricular size is severely enlarged. Right ventricular systolic function is normal. There is moderately elevated pulmonary artery systolic pressure. The tricuspid regurgitant velocity is 3.43 m/s, and with an assumed right atrial pressure of 8 mmHg, the estimated right ventricular systolic pressure is 17.6 mmHg. Left Atrium: Left atrial size was normal in size. Right Atrium: Right atrial size was normal in size. Pericardium: There is no evidence of pericardial effusion. Mitral Valve: HR 79 bpm. The mitral valve is degenerative in appearance. Severe mitral annular calcification. Mild to moderate mitral valve regurgitation. Mild mitral valve stenosis. MV peak gradient, 10.5 mmHg. The mean mitral valve gradient is 5.0 mmHg. Tricuspid Valve: Tricuspid valve regurgitation is moderate. Aortic Valve: The aortic valve was not well visualized. Aortic valve regurgitation is not visualized. Aortic valve mean gradient measures 6.5 mmHg. Aortic valve peak gradient measures 12.0 mmHg. Aortic valve area, by VTI measures 2.90 cm. Pulmonic Valve: Pulmonic valve regurgitation is not visualized. Aorta: The aortic root and ascending aorta are structurally normal, with no evidence of dilitation. Venous: The inferior vena cava is normal in size with less than 50% respiratory variability, suggesting right atrial pressure of 8 mmHg. IAS/Shunts: No atrial level shunt detected by color flow Doppler.  LEFT VENTRICLE PLAX 2D LVIDd:         3.70 cm   Diastology LVIDs:         2.40 cm   LV e' medial:    3.48  cm/s LV PW:         1.10 cm   LV E/e' medial:  35.6 LV IVS:        1.50 cm   LV e' lateral:  4.90 cm/s LVOT diam:     2.00 cm   LV E/e' lateral: 25.3 LV SV:         95 LV SV Index:   56 LVOT Area:     3.14 cm                           3D Volume EF:                          3D EF:        68 %                          LV EDV:       90 ml                          LV ESV:       29 ml                          LV SV:        61 ml RIGHT VENTRICLE             IVC RV S prime:     14.50 cm/s  IVC diam: 2.10 cm TAPSE (M-mode): 2.6 cm LEFT ATRIUM           Index        RIGHT ATRIUM           Index LA Vol (A2C): 33.5 ml 19.93 ml/m  RA Area:     15.10 cm LA Vol (A4C): 46.6 ml 27.72 ml/m  RA Volume:   38.10 ml  22.66 ml/m  AORTIC VALVE AV Area (Vmax):    2.76 cm AV Area (Vmean):   2.60 cm AV Area (VTI):     2.90 cm AV Vmax:           173.00 cm/s AV Vmean:          117.000 cm/s AV VTI:            0.327 m AV Peak Grad:      12.0 mmHg AV Mean Grad:      6.5 mmHg LVOT Vmax:         152.00 cm/s LVOT Vmean:        97.000 cm/s LVOT VTI:          0.302 m LVOT/AV VTI ratio: 0.92  AORTA Ao Root diam: 2.80 cm Ao Asc diam:  2.70 cm MITRAL VALVE                  TRICUSPID VALVE MV Area (PHT): 2.42 cm       TR Peak grad:   47.1 mmHg MV Area VTI:   1.67 cm       TR Vmax:        343.00 cm/s MV Peak grad:  10.5 mmHg MV Mean grad:  5.0 mmHg       SHUNTS MV Vmax:       1.62 m/s       Systemic VTI:  0.30 m MV Vmean:      103.0 cm/s     Systemic Diam: 2.00 cm MV Decel Time: 313 msec MR Peak grad:    60.8 mmHg MR Mean grad:    37.0 mmHg MR Vmax:  390.00 cm/s MR Vmean:        286.0 cm/s MR PISA:         4.02 cm MR PISA Eff ROA: 41 mm MR PISA Radius:  0.80 cm MV E velocity: 124.00 cm/s MV A velocity: 152.00 cm/s MV E/A ratio:  0.82 Mary LandBranch Electronically signed by Carolan ClinesMary Branch Signature Date/Time: 06/13/2022/4:22:23 PM    Final    CT Lumbar Spine Wo Contrast  Result Date: 06/12/2022 CLINICAL DATA:  Trauma EXAM: CT LUMBAR SPINE  WITHOUT CONTRAST TECHNIQUE: Multidetector CT imaging of the lumbar spine was performed without intravenous contrast administration. Multiplanar CT image reconstructions were also generated. RADIATION DOSE REDUCTION: This exam was performed according to the departmental dose-optimization program which includes automated exposure control, adjustment of the mA and/or kV according to patient size and/or use of iterative reconstruction technique. COMPARISON:  Same day lumbar spine radiograph, lumbar spine CT 09/17/2021 FINDINGS: Segmentation: 5 lumbar type vertebrae. Alignment: Normal. Vertebrae: Redemonstrated is a severe chronic deformity at L4 with progressive height loss along the anterior aspect of the vertebral body compared to 09/17/2021. compared to prior exam there is a new superior endplate compression deformity at the L2 vertebral body level without significant height loss. Paraspinal and other soft tissues: Aortic atherosclerotic calcification. Disc levels: There is no significant change from prior exam with persistent moderate spinal canal stenosis at L1-L2, severe spinal canal stenosis at L2-L5. there is also severe bilateral neural foraminal stenosis at L3-L4 and L4-L5. IMPRESSION: 1. New superior endplate compression deformity at the L2 vertebral body level without significant height loss. 2. Severe chronic deformity at L4 with progressive height loss along the anterior aspect of the vertebral body compared to 09/17/2021. 3. Unchanged severe spinal canal stenosis at L2-L5 and severe bilateral neural foraminal stenosis at L3-L4 and L4-L5. Aortic Atherosclerosis (ICD10-I70.0). Electronically Signed   By: Lorenza CambridgeHemant  Desai M.D.   On: 06/12/2022 17:41   DG Lumbar Spine Complete  Result Date: 06/12/2022 CLINICAL DATA:  Lower back pain EXAM: LUMBAR SPINE - COMPLETE 4+ VIEW COMPARISON:  12/05/2021 FINDINGS: Osseous demineralization. Five non-rib-bearing lumbar vertebra. Marked compression deformity of L4  vertebral body similar to previous study. Mild superior endplate compression deformity of L2 with minimal height loss, new. No additional fracture, subluxation, or bone destruction. Multilevel facet degenerative changes. Minimal levoconvex lumbar scoliosis. SI joints preserved. Atherosclerotic calcifications aorta. IMPRESSION: Chronic L4 compression fracture with minimal superior endplate compression deformity of L2 vertebral body new since 12/05/2021. Aortic Atherosclerosis (ICD10-I70.0). Electronically Signed   By: Ulyses SouthwardMark  Boles M.D.   On: 06/12/2022 16:13   DG Chest 2 View  Result Date: 06/12/2022 CLINICAL DATA:  Hypoxia, new oxygen requirement EXAM: CHEST - 2 VIEW COMPARISON:  11/03/2018 FINDINGS: Enlargement of cardiac silhouette. Mediastinal contours and pulmonary vascularity normal. Atherosclerotic calcification aorta. Emphysematous changes with chronic accentuation of interstitial markings unchanged. Small bibasilar pleural effusions. No acute infiltrate or pneumothorax. Bones demineralized with slightly increased compression fracture of a midthoracic vertebra. IMPRESSION: COPD changes with small bibasilar pleural effusions increased versus previous study. Chronic accentuation of interstitial markings without acute infiltrate. Slightly increased compression fracture of a midthoracic vertebra. Aortic Atherosclerosis (ICD10-I70.0) and Emphysema (ICD10-J43.9). Electronically Signed   By: Ulyses SouthwardMark  Boles M.D.   On: 06/12/2022 16:11    Labs:  CBC: Recent Labs    09/20/21 0437 06/12/22 1550 06/13/22 0559 06/16/22 0336  WBC 7.3 9.7 8.3 6.1  HGB 13.5 13.2 12.8 12.6  HCT 40.7 38.4 36.9 37.3  PLT 253 142* 133* 145*  COAGS: No results for input(s): "INR", "APTT" in the last 8760 hours.  BMP: Recent Labs    06/12/22 1550 06/13/22 0559 06/16/22 0336 06/17/22 0327  NA 134* 134* 129* 131*  K 4.0 4.0 4.2 4.3  CL 99 100 94* 99  CO2 27 25 27 23   GLUCOSE 103* 82 105* 98  BUN 17 13 11 13    CALCIUM 9.5 8.5* 8.5* 8.3*  CREATININE 0.73 0.76 0.71 0.72  GFRNONAA >60 >60 >60 >60    LIVER FUNCTION TESTS: Recent Labs    09/18/21 0431 09/20/21 0437 06/13/22 0559  BILITOT 0.9 0.6 1.5*  AST 48* 48* 38  ALT 39 38 23  ALKPHOS 86 93 68  PROT 6.7 6.6 6.2*  ALBUMIN 3.3* 3.3* 3.1*    TUMOR MARKERS: No results for input(s): "AFPTM", "CEA", "CA199", "CHROMGRNA" in the last 8760 hours.  Assessment and Plan:  Lumbar compression fractures --at L2 and L4 -- pain not controlled --recommend Kyphoplasty at L2 and L4; insurance authorization received --lengthy conversation had about realistic expectations and answering all questions --tentatively planned for tomorrow, 1/31  --NPO at midnight, hold lovenox, abx ordered.  Risks and benefits of kyphoplasty were discussed with the patient including, but not limited to education regarding the natural healing process of compression fractures without intervention, bleeding, infection, cement migration which may cause spinal cord damage, paralysis, pulmonary embolism or even death.  This interventional procedure involves the use of X-rays and because of the nature of the planned procedure, it is possible that we will have prolonged use of X-ray fluoroscopy.  Potential radiation risks to you include (but are not limited to) the following: - A slightly elevated risk for cancer  several years later in life. This risk is typically less than 0.5% percent. This risk is low in comparison to the normal incidence of human cancer, which is 33% for women and 50% for men according to the [place]. - Radiation induced injury can include skin redness, resembling a rash, tissue breakdown / ulcers and hair loss (which can be temporary or permanent).   The likelihood of either of these occurring depends on the difficulty of the procedure and whether you are sensitive to radiation due to previous procedures, disease, or genetic conditions.    IF your procedure requires a prolonged use of radiation, you will be notified and given written instructions for further action.  It is your responsibility to monitor the irradiated area for the 2 weeks following the procedure and to notify your physician if you are concerned that you have suffered a radiation induced injury.    All of the patient's questions were answered, patient is agreeable to proceed.  Consent signed and in chart.   Thank you for this interesting consult.  I greatly enjoyed meeting Lauren Davidson and look forward to participating in their care.  A copy of this report was sent to the requesting provider on this date.  Electronically Signed: Pasty Spillers, PA 06/18/2022, 1:25 PM   I spent a total of 55 Mi[place]utes in face to face in clinical consultation, greater than 50% of which was counseling/coordinating care for atraumatic vertebral fractures.

## 2022-06-18 NOTE — TOC Progression Note (Signed)
Transition of Care [facility]) - Progression Note    Patient Details  Name: Lauren Davidson MRN: [id number] Date of Birth: 26-Jun-1921  [facility] of [facility] CM/SW Contact  Sharin Mons, RN Phone Number: 06/18/2022, 11:01 AM  Clinical Narrative:     NCM called IR for status of insurance authorization for Kyphoplasty. Voice message left with [place] PA. Awaiting call back.  TOC team will continue to monitor and assist with needs.  Expected Discharge Plan: [place] Barriers to Discharge: Continued Medical Work up  Expected Discharge Plan and Services   Discharge Planning Services: CM Consult   Living arrangements for the past 2 months: [place]: PT [place] Agency: Other - See comment ([facility]/ in house therapy @ [facility])         Social Determinants of Health (SDOH) Interventions SDOH Screenings   Food Insecurity: No Food Insecurity (06/13/2022)  Housing: Low Risk  (06/13/2022)  Transportation Needs: No Transportation Needs (06/13/2022)  Utilities: Not At Risk (06/13/2022)  Tobacco Use: Low Risk  (06/12/2022)    Readmission Risk Interventions     No data to display

## 2022-06-18 NOTE — Progress Notes (Signed)
Mobility Specialist Progress Note   06/18/22 1520  Mobility  Activity Transferred from chair to bed  Level of Assistance Minimal assist, patient does 75% or more  Assistive Device Front wheel walker  Distance Ambulated (ft) 4 ft  Activity Response Tolerated well  Mobility Referral Yes  $Mobility charge 1 Mobility   Pt requesting assistance to get from chair to bed d/t fatigue and tiredness. Required MinA to rise from chair w/ steady use of RW. Pt able to adhere to cues well. Pt able to get in bed w/ standby assist. Left w/ all needs met, call bell in reach and bed alarm on. RN notified about pain.    Holland Falling Mobility Specialist Please contact via SecureChat or  Rehab office at [phone]'

## 2022-06-18 NOTE — Progress Notes (Signed)
[facility]                                                                               Lauren Davidson, is a 89 y.o. female, DOB - [birth year], MVH:[id number] Admit date - 06/12/2022    Outpatient Primary MD for the patient is Charlane Ferretti, MD  LOS - 2  days    Brief summary    Lauren Davidson is a 89 y.o. female with medical history significant for hyperlipidemia, hypertension, chronic back pain, atraumatic L4 compression fracture in May 2023, who initially presented to [facility] ED from independent living facility, with complaints of new onset low back pain over the last few days.    CT of lumbar spine revealed L2 compression fracture. EDP discussed the case with neurosurgery on-call Dr. Marcello Moores who recommended TLSO when out of bed for comfort. The patient was transferred to [facility] for symptoms management. IR consulted to evaluate for possible kyphoplasty. The patient was accepted by [facility], hospitalist service.  MRI  of the lumbar spine ordered for further evaluation.  Currently waiting for timing of the kyphoplasty.  Patient is not stable to go home at this time due to increased pain and deconditioning. Patient seen and examined at bedside. No new complaints.     Assessment & Plan    Assessment and Plan:   L2 AND  L4 vertebral compression fracture:  MRI of the lumbar spine done and results discussed with family and the patient.  Awaiting IR recommendations on the timing of the kyphoplasty. Awaiting insurance approval.  Therapy eval recommending home health PT.  Discussed about going home and coming back for the surgery when its scheduled.  Patient reports that that she lives at [facility] in the independent section and she is not able to care for herself and she is in tremendous pain with movement and hence rather stay in the hospital and get the procedure done prior to being discharged.  Discussed the plan with the patient and family at  bedside. Currently waiting for the timing of the kyphoplasty.   Deconditioning:  Therapy evals recommending HHPT.    Hypertension:  Bp parameters are well controlled.   Anxiety:  On lexapro , continue the same.   Chronic back pain:  Pain control.   Mild thrombocytopenia:  Improving platelets.    Hyponatremia:  Suspect from poor oral intake/ probably from pain too.  Started her on IV fluids and sodium improved from 129 to 131.  Serum osmo is 283, TSH wnl, am cortisol level is 131. Check labs in am.     Estimated body mass index is 24.03 kg/m as calculated from the following:   Height as of this encounter: 5\' 4"  (1.626 m).   Weight as of this encounter: 63.5 kg.  Code Status: DNR DVT Prophylaxis:  enoxaparin (LOVENOX) injection 40 mg Start: 06/13/22 1000   Level of Care: Level of care: Med-Surg Family Communication: discussed with patient's family on the phone.   Disposition Plan:     Remains inpatient appropriate: Awaiting kyphoplasty Procedures:  None.   Consultants:   IR  Antimicrobials:   Anti-infectives (From admission, onward)  None        Medications  Scheduled Meds:  artificial tears  1 Application Both Eyes QHS   docusate sodium  100 mg Oral BID   enoxaparin (LOVENOX) injection  40 mg Subcutaneous Daily   escitalopram  10 mg Oral Daily   lidocaine  1 patch Transdermal Q24H   multivitamin with minerals  1 tablet Oral Q lunch   polyvinyl alcohol  1 drop Both Eyes QID   senna-docusate  1 tablet Oral QHS   Continuous Infusions:  sodium chloride 75 mL/hr at 06/18/22 0703   PRN Meds:.acetaminophen, melatonin, polyethylene glycol, prochlorperazine    Subjective:   Lauren Davidson was seen and examined today.  No new complaints.   Objective:   Vitals:   06/17/22 1458 06/17/22 2029 06/18/22 0858 06/18/22 1233  BP: (!) 144/64 (!) 140/62 (!) 164/70 (!) 141/88  Pulse: 63 64 81 63  Resp: 17 18 17 18   Temp: 98.8 F (37.1 C) 98.9 F (37.2  C) 98.4 F (36.9 C) 98.7 F (37.1 C)  TempSrc:      SpO2: 93% 93% 94% 95%  Weight:      Height:        Intake/Output Summary (Last 24 hours) at 06/18/2022 1331 Last data filed at 06/18/2022 0930 Gross per 24 hour  Intake 1769.88 ml  Output 1100 ml  Net 669.88 ml    Filed Weights   06/12/22 1357  Weight: 63.5 kg     Exam General exam: elderly woman, not in distress.  Respiratory system: Clear to auscultation. Respiratory effort normal. Cardiovascular system: S1 & S2 heard, RRR. No JVD, murmurs Gastrointestinal system: Abdomen is nondistended, soft and nontender.  Central nervous system: Alert and oriented. No focal neurological deficits. Extremities: Symmetric 5 x 5 power. Skin: No rashes,  Psychiatry:  Mood & affect appropriate.       Data Reviewed:  I have personally reviewed following labs and imaging studies   CBC Lab Results  Component Value Date   WBC 6.1 06/16/2022   RBC 3.67 (L) 06/16/2022   HGB 12.6 06/16/2022   HCT 37.3 06/16/2022   MCV 101.6 (H) 06/16/2022   MCH 34.3 (H) 06/16/2022   PLT 145 (L) 06/16/2022   MCHC 33.8 06/16/2022   RDW 12.8 06/16/2022   LYMPHSABS 1.0 06/16/2022   MONOABS 1.0 06/16/2022   EOSABS 0.2 06/16/2022   BASOSABS 0.0 99/35/7017     Last metabolic panel Lab Results  Component Value Date   NA 131 (L) 06/17/2022   K 4.3 06/17/2022   CL 99 06/17/2022   CO2 23 06/17/2022   BUN 13 06/17/2022   CREATININE 0.72 06/17/2022   GLUCOSE 98 06/17/2022   GFRNONAA >60 06/17/2022   CALCIUM 8.3 (L) 06/17/2022   PHOS 2.7 06/13/2022   PROT 6.2 (L) 06/13/2022   ALBUMIN 3.1 (L) 06/13/2022   BILITOT 1.5 (H) 06/13/2022   ALKPHOS 68 06/13/2022   AST 38 06/13/2022   ALT 23 06/13/2022   ANIONGAP 9 06/17/2022    CBG (last 3)  No results for input(s): "GLUCAP" in the last 72 hours.    Coagulation Profile: No results for input(s): "INR", "PROTIME" in the last 168 hours.   Radiology Studies: No results found.     Hosie Poisson M.D. [facility] 06/18/2022, 1:31 PM  Available via Epic secure chat 7am-7pm After 7 pm, please refer to night coverage provider listed on amion.

## 2022-06-19 DIAGNOSIS — S32020A Wedge compression fracture of second lumbar vertebra, initial encounter for closed fracture: Secondary | ICD-10-CM | POA: Diagnosis not present

## 2022-06-19 DIAGNOSIS — S32020D Wedge compression fracture of second lumbar vertebra, subsequent encounter for fracture with routine healing: Secondary | ICD-10-CM | POA: Diagnosis not present

## 2022-06-19 LAB — CBC WITH DIFFERENTIAL/PLATELET
Abs Immature Granulocytes: 0.03 10*3/uL (ref 0.00–0.07)
Basophils Absolute: 0 10*3/uL (ref 0.0–0.1)
Basophils Relative: 0 %
Eosinophils Absolute: 0.1 10*3/uL (ref 0.0–0.5)
Eosinophils Relative: 2 %
HCT: 34.9 % — ABNORMAL LOW (ref 36.0–46.0)
Hemoglobin: 12 g/dL (ref 12.0–15.0)
Immature Granulocytes: 1 %
Lymphocytes Relative: 12 %
Lymphs Abs: 0.7 10*3/uL (ref 0.7–4.0)
MCH: 34.7 pg — ABNORMAL HIGH (ref 26.0–34.0)
MCHC: 34.4 g/dL (ref 30.0–36.0)
MCV: 100.9 fL — ABNORMAL HIGH (ref 80.0–100.0)
Monocytes Absolute: 1 10*3/uL (ref 0.1–1.0)
Monocytes Relative: 16 %
Neutro Abs: 4.5 10*3/uL (ref 1.7–7.7)
Neutrophils Relative %: 69 %
Platelets: 186 10*3/uL (ref 150–400)
RBC: 3.46 MIL/uL — ABNORMAL LOW (ref 3.87–5.11)
RDW: 12.5 % (ref 11.5–15.5)
WBC: 6.4 10*3/uL (ref 4.0–10.5)
nRBC: 0 % (ref 0.0–0.2)

## 2022-06-19 LAB — BASIC METABOLIC PANEL
Anion gap: 7 (ref 5–15)
BUN: 14 mg/dL (ref 8–23)
CO2: 25 mmol/L (ref 22–32)
Calcium: 8.4 mg/dL — ABNORMAL LOW (ref 8.9–10.3)
Chloride: 101 mmol/L (ref 98–111)
Creatinine, Ser: 0.67 mg/dL (ref 0.44–1.00)
GFR, Estimated: >60 mL/min (ref >60)
Glucose, Bld: 101 mg/dL — ABNORMAL HIGH (ref 70–99)
Potassium: 4.2 mmol/L (ref 3.5–5.1)
Sodium: 133 mmol/L — ABNORMAL LOW (ref 135–145)

## 2022-06-19 NOTE — Progress Notes (Signed)
  Due to IR schedule, unable to accommodate procedure today.  Will plan for tomorrow. Resume diet, NPO after MN.  Murrell Redden PA-C 06/19/2022 1:43 PM    '

## 2022-06-19 NOTE — Progress Notes (Signed)
PT Cancellation Note  Patient Details Name: Lauren Davidson MRN: [id number] DOB: 24-Jul-1921   Cancelled Treatment:    Reason Eval/Treat Not Completed: (P) Fatigue/lethargy limiting ability to participate (pt defers OOB at this time, reports she just got back to bed with assist from mobility specialist and awaiting IV team members to return.) Will continue efforts next date per PT plan of care as schedule permits.    Kara Pacer Jacobie Stamey 06/19/2022, 5:09 PM

## 2022-06-19 NOTE — Progress Notes (Signed)
[facility]  PROGRESS NOTE  STEPHAINE BRESHEARS YQI:[id number] DOB: 08/15/1921 DOA: 06/12/2022 PCP: Charlane Ferretti, MD   Brief HPI:   89 year old female with medical history of hyperlipidemia, hypertension, chronic back pain, atraumatic L4 compression fracture in May 2023 who initially presented to [facility] ED from independent living facility with complaint of new onset low back pain over the past few days. CT of lumbar spine revealed L2 compression fracture.  EDP discussed the case with neurosurgery on-call Dr. Marcello Moores who recommended TLSO when out of bed for comfort.  Patient was transferred to [facility] for symptom management.  IR was consulted to evaluate for possible kyphoplasty.  MRI of the lumbar spine showed acute compression fracture involving L2 vertebral body up to 20% height loss and trace 2 mm bony retropulsion.  Acute to subacute compression fracture involving L4.  Degenerative spondylosis at L3-4, L4-5. IR was consulted    Subjective   Patient seen and examined, IR has insurance authorization.  Plan for procedure today.   Assessment/Plan:     L2/L4 vertebral compression fracture -MRI showed subacute compression fracture involving L4, acute compression fracture involving L2 -Awaiting IR recommendations for timing of kyphoplasty, awaiting insurance approval -Therapy evaluation obtained, recommend home health PT -Discussed about going home and coming back for the surgery when its scheduled.  Patient reports that that she lives at [facility] in the independent section and she is not able to care for herself and she is in tremendous pain with movement and hence rather stay in the hospital and get the procedure done prior to being discharged.  Discussed the plan with the patient and family at bedside. Currently waiting for the timing of the kyphoplasty.  Hypertension -Blood pressure is well-controlled  Anxiety -Continue Lexapro  Chronic back pain -Continue pain  control  Mild thrombocytopenia -Platelets improved  Hyponatremia -Improved -Likely from poor p.o. intake -Serum osmolality 283  Medications     artificial tears  1 Application Both Eyes QHS   docusate sodium  100 mg Oral BID   enoxaparin (LOVENOX) injection  40 mg Subcutaneous Daily   escitalopram  10 mg Oral Daily   lidocaine  1 patch Transdermal Q24H   multivitamin with minerals  1 tablet Oral Q lunch   polyvinyl alcohol  1 drop Both Eyes QID   senna-docusate  1 tablet Oral QHS     Data Reviewed:   CBG:  No results for input(s): "GLUCAP" in the last 168 hours.  SpO2: 95 % O2 Flow Rate (L/min): 2 L/min    Vitals:   06/18/22 0858 06/18/22 1233 06/18/22 2000 06/19/22 0809  BP: (!) 164/70 (!) 141/88 131/77 (!) 142/61  Pulse: 81 63 79 61  Resp: 17 18 20 16   Temp: 98.4 F (36.9 C) 98.7 F (37.1 C) 97.7 F (36.5 C) (!) 97.5 F (36.4 C)  TempSrc:   Oral Oral  SpO2: 94% 95% 98% 95%  Weight:      Height:          Data Reviewed:  Basic Metabolic Panel: Recent Labs  Lab 06/12/22 1550 06/13/22 0559 06/16/22 0336 06/17/22 0327 06/19/22 0051  NA 134* 134* 129* 131* 133*  K 4.0 4.0 4.2 4.3 4.2  CL 99 100 94* 99 101  CO2 27 25 27 23 25   GLUCOSE 103* 82 105* 98 101*  BUN 17 13 11 13 14   CREATININE 0.73 0.76 0.71 0.72 0.67  CALCIUM 9.5 8.5* 8.5* 8.3* 8.4*  MG  --  1.9  --   --   --  PHOS  --  2.7  --   --   --     CBC: Recent Labs  Lab 06/12/22 1550 06/13/22 0559 06/16/22 0336 06/19/22 0051  WBC 9.7 8.3 6.1 6.4  NEUTROABS 8.1*  --  3.9 4.5  HGB 13.2 12.8 12.6 12.0  HCT 38.4 36.9 37.3 34.9*  MCV 101.3* 101.4* 101.6* 100.9*  PLT 142* 133* 145* 186    LFT Recent Labs  Lab 06/13/22 0559  AST 38  ALT 23  ALKPHOS 68  BILITOT 1.5*  PROT 6.2*  ALBUMIN 3.1*     Antibiotics: Anti-infectives (From admission, onward)    Start     Dose/Rate Route Frequency Ordered Stop   06/18/22 1430  ceFAZolin (ANCEF) IVPB 2g/100 mL premix        2 g 200  mL/hr over 30 Minutes Intravenous To Radiology 06/18/22 1339 06/19/22 1430        DVT prophylaxis: Lovenox  Code Status: DNR  Family Communication: Discussed with patient's daughter at bedside   CONSULTS interventional radiology   Objective    Physical Examination:   General-appears in no acute distress Heart-S1-S2, regular, no murmur auscultated Lungs-clear to auscultation bilaterally, no wheezing or crackles auscultated Abdomen-soft, nontender, no organomegaly Extremities-no edema in the lower extremities Neuro-alert, oriented x3, no focal deficit noted  Status is: Inpatient:             [name]   [facility] If 7PM-7AM, please contact night-coverage at [email or web address], Office  [phone]   06/19/2022, 10:24 AM  LOS: 3 days

## 2022-06-19 NOTE — Progress Notes (Signed)
Occupational Therapy Treatment Patient Details Name: Lauren Davidson MRN: [id number] DOB: Jul 02, 1921 Today's Date: 06/19/2022   History of present illness Pt is a 89 y/o female who presents from independent living facility with new onset low back pain. Pt denies recent fall or trauma. CT revealed L2 compression fracture. TLSO recommended for comfort.  PMH: hyperlipidemia, hypertension, chronic back pain, atraumatic L4 compression fracture in May 2023.   OT comments  Pt eager to have procedure today. Min assist for bed mobility and to transfer to [facility] and back to bed. Pt completed 2 grooming activities at EOB. Pt reports having paid for caregivers when returning home after her previous compression fractures, recommended pt do the same upon discharge. Pt reports her daughter cannot physically assist her with personal care or mobility.    Recommendations for follow up therapy are one component of a multi-disciplinary discharge planning process, led by the attending physician.  Recommendations may be updated based on patient status, additional functional criteria and insurance authorization.    Follow Up Recommendations  Home health OT     Assistance Recommended at Discharge Intermittent Supervision/Assistance  Patient can return home with the following  A little help with walking and/or transfers;A little help with bathing/dressing/bathroom;Assistance with feeding;Assist for transportation;Help with stairs or ramp for entrance   Equipment Recommendations       Recommendations for Other Services      Precautions / Restrictions Precautions Precautions: Fall;Back Required Braces or Orthoses: Spinal Brace Spinal Brace: Thoracolumbosacral orthotic;Applied in sitting position;Other (comment) (for comfort) Restrictions Weight Bearing Restrictions: No       Mobility Bed Mobility Overal bed mobility: Needs Assistance Bed Mobility: Rolling, Sidelying to Sit, Sit to Sidelying Rolling: Min  assist Sidelying to sit: Min assist     Sit to sidelying: Min assist General bed mobility comments: use of rail, assist of bed pad to roll, min assist to raise trunk and for LEs back into bed    Transfers Overall transfer level: Needs assistance Equipment used: 1 person hand held assist Transfers: Sit to/from Stand, Bed to chair/wheelchair/BSC Sit to Stand: Min assist Stand pivot transfers: Min assist         General transfer comment: assist to rise and steady     Balance Overall balance assessment: Needs assistance   Sitting balance-Leahy Scale: Fair       Standing balance-Leahy Scale: Poor                             ADL either performed or assessed with clinical judgement   ADL Overall ADL's : Needs assistance/impaired     Grooming: Oral care;Sitting;Wash/dry face Grooming Details (indicate cue type and reason): used Biotine                 Toilet Transfer: Minimal assistance;Stand-pivot;BSC/3in1   Toileting- Clothing Manipulation and Hygiene: Set up;Sitting/lateral lean              Extremity/Trunk Assessment              Vision       Perception     Praxis      Cognition Arousal/Alertness: Awake/alert Behavior During Therapy: WFL for tasks assessed/performed Overall Cognitive Status: Impaired/Different from baseline Area of Impairment: Memory                     Memory: Decreased short-term memory  Exercises      Shoulder Instructions       General Comments      Pertinent Vitals/ Pain       Pain Assessment Pain Assessment: Faces Faces Pain Scale: Hurts even more Pain Location: back Pain Descriptors / Indicators: Grimacing, Guarding, Discomfort, Moaning Pain Intervention(s): Monitored during session, Repositioned  Home Living                                          Prior Functioning/Environment              Frequency  Min 2X/week         Progress Toward Goals  OT Goals(current goals can now be found in the care plan section)  Progress towards OT goals: Progressing toward goals  Acute Rehab OT Goals OT Goal Formulation: With patient Time For Goal Achievement: 06/28/22 Potential to Achieve Goals: Good  Plan Discharge plan remains appropriate    Co-evaluation                 AM-PAC OT "6 Clicks" Daily Activity     Outcome Measure   Help from another person eating meals?: None Help from another person taking care of personal grooming?: A Little Help from another person toileting, which includes using toliet, bedpan, or urinal?: A Little Help from another person bathing (including washing, rinsing, drying)?: A Little Help from another person to put on and taking off regular upper body clothing?: A Little Help from another person to put on and taking off regular lower body clothing?: A Little 6 Click Score: 19    End of Session Equipment Utilized During Treatment: Gait belt  OT Visit Diagnosis: Muscle weakness (generalized) (M62.81);Repeated falls (R29.6)   Activity Tolerance Patient tolerated treatment well   Patient Left in bed;with call bell/phone within reach;with bed alarm set   Nurse Communication          Time: [id number] OT Time Calculation (min): 22 min  Charges: OT General Charges $OT Visit: 1 Visit OT Treatments $Self Care/Home Management : 8-22 mins  Lauren Davidson, OTR/L Acute Rehabilitation Services Office: [phone]   Malka So 06/19/2022, 9:16 AM

## 2022-06-19 NOTE — Progress Notes (Signed)
Mobility Specialist Progress Note   06/19/22 1000  Mobility  Activity Transferred from bed to chair  Level of Assistance Minimal assist, patient does 75% or more  Assistive Device Front wheel walker  Distance Ambulated (ft) 4 ft  Activity Response Tolerated well  Mobility Referral Yes  $Mobility charge 1 Mobility   Received pt in bed soiled and requiring bed change + pericare. Able to get EOB w/ standby A and increased time. Total A to donn brace d/t LBP, stood w/ minA and transferred to chair w/o fault. Left in chair w/ NT in room, call bell in reach and chair alarm on.   Holland Falling Mobility Specialist Please contact via SecureChat or  Rehab office at [phone]

## 2022-06-19 NOTE — Care Management Important Message (Signed)
Important Message  Patient Details  Name: Lauren Davidson MRN: [id number] Date of Birth: March 30, 1922   Medicare Important Message Given:  Yes     Hannah Beat 06/19/2022, 11:23 AM

## 2022-06-19 NOTE — Progress Notes (Signed)
Mobility Specialist Progress Note   06/19/22 1623  Mobility  Activity Transferred from chair to bed  Level of Assistance Minimal assist, patient does 75% or more  Assistive Device Front wheel walker  Distance Ambulated (ft) 4 ft  Activity Response Tolerated well  Mobility Referral Yes  $Mobility charge 1 Mobility   IV team requesting assistance to get pt back to bed. Pt in chair requiring minA to stand but expressing pain has settled compared to this morning. During transfer, pt voiding slightly while ambulating but no faults in mobility. BTB w/ RN present in room.   Holland Falling Mobility Specialist Please contact via SecureChat or  Rehab office at [phone]

## 2022-06-20 ENCOUNTER — Inpatient Hospital Stay (HOSPITAL_COMMUNITY): Payer: Medicare HMO

## 2022-06-20 DIAGNOSIS — S32020A Wedge compression fracture of second lumbar vertebra, initial encounter for closed fracture: Secondary | ICD-10-CM | POA: Diagnosis not present

## 2022-06-20 DIAGNOSIS — S32020D Wedge compression fracture of second lumbar vertebra, subsequent encounter for fracture with routine healing: Secondary | ICD-10-CM | POA: Diagnosis not present

## 2022-06-20 HISTORY — PX: IR KYPHO LUMBAR INC FX REDUCE BONE BX UNI/BIL CANNULATION INC/IMAGING: IMG5519

## 2022-06-20 LAB — CREATININE, SERUM
Creatinine, Ser: 0.64 mg/dL (ref 0.44–1.00)
GFR, Estimated: >60 mL/min (ref >60)

## 2022-06-20 LAB — PROTIME-INR
INR: 1 (ref 0.8–1.2)
Prothrombin Time: 13.2 seconds (ref 11.4–15.2)

## 2022-06-20 MED ORDER — MIDAZOLAM HCL 2 MG/2ML IJ SOLN
INTRAMUSCULAR | Status: AC | PRN
  Administered 2022-06-20 (×3): .5 mg via INTRAVENOUS

## 2022-06-20 MED ORDER — BUPIVACAINE HCL (PF) 0.5 % IJ SOLN
INTRAMUSCULAR | Status: AC
  Administered 2022-06-20: 15 mL
  Filled 2022-06-20: qty 30

## 2022-06-20 MED ORDER — AMLODIPINE BESYLATE 5 MG PO TABS
5.0000 mg | ORAL_TABLET | Freq: Every day | ORAL | Status: DC
  Administered 2022-06-21 – 2022-06-25 (×5): 5 mg via ORAL
  Filled 2022-06-20 (×5): qty 1

## 2022-06-20 MED ORDER — IOHEXOL 300 MG/ML  SOLN
50.0000 mL | Freq: Once | INTRAMUSCULAR | Status: AC | PRN
  Administered 2022-06-20: 15 mL

## 2022-06-20 MED ORDER — PRESERVISION AREDS 2 PO CAPS: 1.0000 | ORAL_CAPSULE | Freq: Two times a day (BID) | ORAL | Status: DC

## 2022-06-20 MED ORDER — SODIUM CHLORIDE 0.9 % IV SOLN
INTRAVENOUS | Status: AC | PRN
  Administered 2022-06-20: 10 mL/h via INTRAVENOUS

## 2022-06-20 MED ORDER — CEFAZOLIN SODIUM-DEXTROSE 2-4 GM/100ML-% IV SOLN
2.0000 g | Freq: Once | INTRAVENOUS | Status: AC
  Administered 2022-06-20: 2 g via INTRAVENOUS
  Filled 2022-06-20: qty 100

## 2022-06-20 MED ORDER — ENOXAPARIN SODIUM 40 MG/0.4ML IJ SOSY
40.0000 mg | PREFILLED_SYRINGE | INTRAMUSCULAR | Status: DC
  Administered 2022-06-21 – 2022-06-25 (×5): 40 mg via SUBCUTANEOUS
  Filled 2022-06-20 (×6): qty 0.4

## 2022-06-20 MED ORDER — FENTANYL CITRATE (PF) 100 MCG/2ML IJ SOLN
INTRAMUSCULAR | Status: AC
  Filled 2022-06-20: qty 2

## 2022-06-20 MED ORDER — CEFAZOLIN SODIUM-DEXTROSE 2-4 GM/100ML-% IV SOLN
INTRAVENOUS | Status: AC
  Administered 2022-06-20: 2 g via INTRAVENOUS
  Filled 2022-06-20: qty 100

## 2022-06-20 MED ORDER — FENTANYL CITRATE (PF) 100 MCG/2ML IJ SOLN
INTRAMUSCULAR | Status: AC | PRN
  Administered 2022-06-20 (×3): 25 ug via INTRAVENOUS

## 2022-06-20 MED ORDER — MIDAZOLAM HCL 2 MG/2ML IJ SOLN
INTRAMUSCULAR | Status: AC
  Filled 2022-06-20: qty 2

## 2022-06-20 MED ORDER — LIDOCAINE HCL (PF) 1 % IJ SOLN
INTRAMUSCULAR | Status: AC
  Administered 2022-06-20: 10 mL
  Filled 2022-06-20: qty 30

## 2022-06-20 NOTE — Sedation Documentation (Signed)
Patient is resting comfortably. 

## 2022-06-20 NOTE — Procedures (Signed)
INTERVENTIONAL NEURORADIOLOGY BRIEF POSTPROCEDURE NOTE  FLUOROSCOPY GUIDED L2 CORE BONE BIOPSY AND BALLOON KYPHOPLASTY   Attending: Dr. Pedro Earls.   Diagnosis: Presumed osteoporotic fragility fracture at L2 and L4.   Access site: Percutaneous bilateral transpedicular.   Anesthesia: Moderate sedation.   Medication used: 1.5 mg Versed IV; 75 mcg Fentanyl IV.  Complications: None.   Estimated blood loss: Negligible.   Specimen: L2 core bone biopsy.   Findings: Superior endplate fracture at L2, amenable to balloon kyphoplasty.  Bilateral transpedicular approach performed.  Core biopsy sample was obtained followed by balloon kyphoplasty.   Compression fracture of the L4 vertebral body with near complete loss of height without adequate access to perform vertebroplasty.  Therefore, no intervention was performed at this level.   The patient tolerated the procedure well without incident or complication and is in stable condition.    RECOMMENDATIONS:  Bed rest for 3 hours.

## 2022-06-20 NOTE — Progress Notes (Signed)
MEDICATION-RELATED CONSULT NOTE   IR Procedure Consult - Anticoagulant/Antiplatelet PTA/Inpatient Med List Review by Pharmacist   Procedure: Kyphoplasty    Completed: 06/20/22  Post-Procedural bleeding risk per IR MD assessment: standard  Antithrombotic medications on inpatient or PTA profile prior to procedure: Enoxaparin 40mg  QD daily   Recommended restart time per IR Post-Procedure Guidelines:  Day +1 AM  Other considerations:     Plan:     Resume enoxaparin 40mg  QD daily on 06/21/22  Dimple Nanas, PharmD, BCPS 06/20/2022 12:51 PM

## 2022-06-20 NOTE — Progress Notes (Signed)
PT Cancellation Note  Patient Details Name: TZIPORAH KNOKE MRN: [id number] DOB: [birth year]   Cancelled Treatment:    Reason Eval/Treat Not Completed: (P) Patient not medically ready (Pt on 3hr bed rest after kyphoplasty per chart review. Will continue PT efforts tomorrow as able.)   Michelle Nasuti 06/20/2022, 2:20 PM

## 2022-06-20 NOTE — Progress Notes (Signed)
[facility]  PROGRESS NOTE  Lauren Davidson DOB: 1921-10-19 DOA: 06/12/2022 PCP: Charlane Ferretti, MD   Brief HPI:   89 year old female with medical history of hyperlipidemia, hypertension, chronic back pain, atraumatic L4 compression fracture in May 2023 who initially presented to [facility] ED from independent living facility with complaint of new onset low back pain over the past few days. CT of lumbar spine revealed L2 compression fracture.  EDP discussed the case with neurosurgery on-call Dr. Marcello Moores who recommended TLSO when out of bed for comfort.  Patient was transferred to [facility] for symptom management.  IR was consulted to evaluate for possible kyphoplasty.  MRI of the lumbar spine showed acute compression fracture involving L2 vertebral body up to 20% height loss and trace 2 mm bony retropulsion.  Acute to subacute compression fracture involving L4.  Degenerative spondylosis at L3-4, L4-5. IR was consulted    Subjective   Patient seen and examined, seen after kyphoplasty.  Denies pain.   Assessment/Plan:     L2/L4 vertebral compression fracture -MRI showed subacute compression fracture involving L4, acute compression fracture involving L2 -Awaiting IR recommendations for timing of kyphoplasty, awaiting insurance approval -Therapy evaluation obtained, recommend home health PT -Discussed about going home and coming back for the surgery when its scheduled.  Patient reports that that she lives at [facility] in the independent section and she is not able to care for herself and she is in tremendous pain with movement and hence rather stay in the hospital and get the procedure done prior to being discharged.  Discussed the plan with the patient and family at bedside. -Underwent kyphoplasty today.  Pain controlled.  Hypertension -Blood pressure is mildly elevated -Start amlodipine 5 mg daily  Anxiety -Continue Lexapro  Chronic back pain -Continue  pain control  Mild thrombocytopenia -Platelets improved  Hyponatremia -Improved -Likely from poor p.o. intake -Serum osmolality 283  Medications     artificial tears  1 Application Both Eyes QHS   docusate sodium  100 mg Oral BID   enoxaparin (LOVENOX) injection  40 mg Subcutaneous Daily   escitalopram  10 mg Oral Daily   lidocaine  1 patch Transdermal Q24H   multivitamin with minerals  1 tablet Oral Q lunch   polyvinyl alcohol  1 drop Both Eyes QID   senna-docusate  1 tablet Oral QHS     Data Reviewed:   CBG:  No results for input(s): "GLUCAP" in the last 168 hours.  SpO2: 95 % O2 Flow Rate (L/min): 2 L/min    Vitals:   06/19/22 0809 06/19/22 1507 06/19/22 2100 06/20/22 0749  BP: (!) 142/61 (!) 150/72 117/77 (!) 169/71  Pulse: 61 69 66 73  Resp: 16 17 18 17   Temp: (!) 97.5 F (36.4 C) 98.3 F (36.8 C) 98.4 F (36.9 C) (!) 97.4 F (36.3 C)  TempSrc: Oral Oral Oral   SpO2: 95% 95% 96% 95%  Weight:      Height:          Data Reviewed:  Basic Metabolic Panel: Recent Labs  Lab 06/16/22 0336 06/17/22 0327 06/19/22 0051 06/20/22 0145  NA 129* 131* 133*  --   K 4.2 4.3 4.2  --   CL 94* 99 101  --   CO2 27 23 25   --   GLUCOSE 105* 98 101*  --   BUN 11 13 14   --   CREATININE 0.71 0.72 0.67 0.64  CALCIUM 8.5* 8.3* 8.4*  --  CBC: Recent Labs  Lab 06/16/22 0336 06/19/22 0051  WBC 6.1 6.4  NEUTROABS 3.9 4.5  HGB 12.6 12.0  HCT 37.3 34.9*  MCV 101.6* 100.9*  PLT 145* 186    LFT No results for input(s): "AST", "ALT", "ALKPHOS", "BILITOT", "PROT", "ALBUMIN" in the last 168 hours.    Antibiotics: Anti-infectives (From admission, onward)    Start     Dose/Rate Route Frequency Ordered Stop   06/18/22 1430  ceFAZolin (ANCEF) IVPB 2g/100 mL premix        2 g 200 mL/hr over 30 Minutes Intravenous To Radiology 06/18/22 1339 06/19/22 1430        DVT prophylaxis: Lovenox  Code Status: DNR  Family Communication: Discussed with patient's  daughter at bedside   CONSULTS interventional radiology   Objective    Physical Examination:  Appears in no acute distress S1-S2, regular Clear to auscultation bilaterally Abdomen is soft, nontender, no organomegaly   Status is: Inpatient:             [name]   [facility] If 7PM-7AM, please contact night-coverage at [email or web address], Office  [phone]   06/20/2022, 11:02 AM  LOS: 4 days

## 2022-06-20 NOTE — Progress Notes (Signed)
Mobility Specialist Progress Note   06/20/22 1050  Mobility  Activity Dangled on edge of bed  Level of Assistance Minimal assist, patient does 75% or more  Assistive Device  (HHA)  Activity Response Tolerated well  Mobility Referral Yes  $Mobility charge 1 Mobility   Received pt in bed having concerns about procedure today but agreeable to mobility. Able to sit EOB w/ minA d/t LBP. Session cut short d/t transport arriving soon to get pt. Pt laid back supine w/ minA. RN present in room.   Holland Falling Mobility Specialist Please contact via SecureChat or  Rehab office at [phone]

## 2022-06-21 DIAGNOSIS — S32020A Wedge compression fracture of second lumbar vertebra, initial encounter for closed fracture: Secondary | ICD-10-CM | POA: Diagnosis not present

## 2022-06-21 DIAGNOSIS — S32020D Wedge compression fracture of second lumbar vertebra, subsequent encounter for fracture with routine healing: Secondary | ICD-10-CM | POA: Diagnosis not present

## 2022-06-21 NOTE — Progress Notes (Signed)
Referring Physician(s): Dr. Hosie Poisson  Supervising Physician: Pedro Earls  Patient Status:  [facility] - In-pt  Chief Complaint: Back pain   Subjective: Patient sitting up in chair having just completed breakfast.  She is accompanied by a family member, Gay Filler.  She reports improvement in her back pain after yesterday's procedure.   Allergies: Statins, Buprenorphine hcl, Buspirone, Lexapro [escitalopram], Lovastatin, Meperidine, and Morphine and related  Medications: Prior to Admission medications   Medication Sig Start Date End Date Taking? Authorizing Provider  acetaminophen (TYLENOL) 500 MG tablet Take 500 mg by mouth every 6 (six) hours as needed (for pain).   Yes [provider]  Calcium Carb-Cholecalciferol (CALCIUM+D3 PO) Take 1 tablet by mouth daily with breakfast.   Yes [provider]  docusate sodium (COLACE) 100 MG capsule Take 1 capsule (100 mg total) by mouth 2 (two) times daily. 09/20/21  Yes Donne Hazel, MD  escitalopram (LEXAPRO) 10 MG tablet Take 10 mg by mouth daily.   Yes [provider]  fish oil-omega-3 fatty acids 1000 MG capsule Take 1,000 mg by mouth 2 (two) times daily after a meal.   Yes [provider]  Multiple Vitamins-Minerals (MULTIVITAMIN WITH MINERALS) tablet Take 1 tablet by mouth daily with lunch.   Yes [provider]  Multiple Vitamins-Minerals (PRESERVISION AREDS 2) CAPS Take 1 capsule by mouth in the morning and at bedtime.   Yes [provider]  SYSTANE OVERNIGHT THERAPY 0.3 % GEL ophthalmic ointment Place 1 application. into both eyes at bedtime.   Yes [provider]  SYSTANE ULTRA PF 0.4-0.3 % SOLN Place 1 drop into both eyes 4 (four) times daily.   Yes [provider]     Vital Signs: BP (!) 144/66 (BP Location: Left Arm)   Pulse 66   Temp 98.4 F (36.9 C) (Oral)   Resp 17   Ht 5\' 4"  (1.626 m)   Wt 139 lb 15.9 oz (63.5 kg)   SpO2 90%   BMI  24.03 kg/m   Physical Exam NAD, alert, sitting comfortably in chair Back: procedure site assessed.  Dressing clean and dry.  No tenderness. Dressing left in place.   Imaging: IR KYPHO LUMBAR INC FX REDUCE BONE BX UNI/BIL CANNULATION INC/IMAGING  Result Date: 06/20/2022 INDICATION: 89 year old female with acute L2 vertebral body fracture and acute on chronic L4 vertebral body fracture and intractable back pain. She presents today for fluoroscopy guided kyphoplasty of the L2 vertebral body and possibly of the L4 vertebral body. EXAM: FLUOROSCOPY GUIDED L2 CORE BONE BIOPSY AND BALLOON KYPHOPLASTY COMPARISON:  CT lumbar spine June 12, 2022; MRI lumbar spine June 13, 2022. MEDICATIONS: As antibiotic prophylaxis, Ancef 2 g IV was ordered pre-procedure and administered intravenously within 1 hour of incision. All current medications are in the EMR and have been reviewed as part of this encounter. ANESTHESIA/SEDATION: Moderate (conscious) sedation was employed during this procedure. A total of Versed 1.5 mg and Fentanyl 75 mcg were administered intravenously for moderate conscious sedation monitored under my direct supervision. Total intraservice time of sedation was 42 minutes. The patient's vital signs were monitored throughout the procedure and recorded in the patient's medical record by the radiology nurse. FLUOROSCOPY: Radiation Exposure Index (as provided by the fluoroscopic device): Peak skin dose (PSD) 2,951 mGy Kerma COMPLICATIONS: None immediate. PROCEDURE: Following a full explanation of the procedure along with the potential associated complications, an informed witnessed consent was obtained. The patient was placed in prone position on  the angiography table. The lumbar spine region was prepped and draped in a sterile fashion. Under fluoroscopy, the L2 vertebral body was delineated and the skin area was marked. The skin was infiltrated with a 1% Lidocaine approximately 3.5 cm lateral to the  spinous process projection on the right. Using a 22-gauge spinal needle, the soft issue and the peripedicular space and periosteum were infiltrated with Bupivacaine 0.5%. A skin incision was made at the access site. Subsequently, an 11-gauge Kyphon trocar was inserted under fluoroscopic guidance until contact with the pedicle was obtained. The trocar was inserted under light hammer tapping into the pedicle until the posterior boundaries of the vertebral body was reached. The diamond mandrill was removed and one core biopsy wasobtained. The skin was infiltrated with a 1% Lidocaine approximately 3.5 cm lateral to the spinous process projection on the left. Using a 22-gauge spinal needle, the soft issue and the peripedicular space and periosteum were infiltrated with Bupivacaine 0.5%. A skin incision was made at the access site. Subsequently, an 11-gauge Kyphon trocar was inserted under fluoroscopic guidance until contact with the pedicle was obtained. The trocar was inserted under light hammer tapping into the pedicle until the posterior boundaries of the vertebral body was reached. The diamond mandrill was removed and one core biopsy wasobtained. A bone drill was coaxially advanced within the anterior third of the vertebral body and then exchanged for inflatable Kyphon balloons. These were centered within the mid-aspect of the vertebral body. The balloons were inflated to create a void to serve as a repository for the bone cement. Both balloons were deflated and through both cannulas, under continuous fluoroscopy guidance in the AP and lateral views, the vertebral body was filled with previously mixed polymethyl-methacrylate (PMMA) added to barium for opacification. Both cannulas were later removed. The access sites were cleaned and covered with a sterile bandage. Fluoroscopic evaluation of L4 vertebral body showed vertebra plana without adequate access for vertebroplasty. Therefore, no intervention performed at the  T4 level. IMPRESSION: 1. Successful fluoroscopy-guided bilateral transpedicular approach for L2 vertebral body core bone biopsy and kyphoplasty for treatment of osteoporotic fragility fracture. Bone samples obtained were sent for pathology analysis. 2. If the patient has known osteoporosis, recommend treatment as clinically indicated. If the patient's bone density status is unknown, DEXA scan is recommended. Electronically Signed   By: Pedro Earls M.D.   On: 06/20/2022 13:26    Labs:  CBC: Recent Labs    06/12/22 1550 06/13/22 0559 06/16/22 0336 06/19/22 0051  WBC 9.7 8.3 6.1 6.4  HGB 13.2 12.8 12.6 12.0  HCT 38.4 36.9 37.3 34.9*  PLT 142* 133* 145* 186    COAGS: Recent Labs    06/20/22 0740  INR 1.0    BMP: Recent Labs    06/13/22 0559 06/16/22 0336 06/17/22 0327 06/19/22 0051 06/20/22 0145  NA 134* 129* 131* 133*  --   K 4.0 4.2 4.3 4.2  --   CL 100 94* 99 101  --   CO2 25 27 23 25   --   GLUCOSE 82 105* 98 101*  --   BUN 13 11 13 14   --   CALCIUM 8.5* 8.5* 8.3* 8.4*  --   CREATININE 0.76 0.71 0.72 0.67 0.64  GFRNONAA >60 >60 >60 >60 >60    LIVER FUNCTION TESTS: Recent Labs    09/18/21 0431 09/20/21 0437 06/13/22 0559  BILITOT 0.9 0.6 1.5*  AST 48* 48* 38  ALT 39 38 23  ALKPHOS 86  93 68  PROT 6.7 6.6 6.2*  ALBUMIN 3.3* 3.3* 3.1*    Assessment and Plan: Back pain in the setting of compression fractures at L2, L4 Patient s/p kyphoplasty at L2. L4 not amenable to vertebroplasty/kyphoplasty.  Patient reports improvement in her back pain since yesterday's procedure.  Her procedure site is intact, clean, and dry.  Discussed post-procedure care with patient and Gay Filler.   Electronically Signed: Docia Barrier, PA 06/21/2022, 12:23 PM   I spent a total of 15 Minutes at the the patient's bedside AND on the patient's hospital floor or unit, greater than 50% of which was counseling/coordinating care for back pain.

## 2022-06-21 NOTE — Progress Notes (Signed)
[facility]  PROGRESS NOTE  Lauren Davidson KYH:[id number] DOB: [birth year] DOA: 06/12/2022 PCP: Charlane Ferretti, MD   Brief HPI:   89 year old female with medical history of hyperlipidemia, hypertension, chronic back pain, atraumatic L4 compression fracture in May 2023 who initially presented to [facility] ED from independent living facility with complaint of new onset low back pain over the past few days. CT of lumbar spine revealed L2 compression fracture.  EDP discussed the case with neurosurgery on-call Dr. Marcello Moores who recommended TLSO when out of bed for comfort.  Patient was transferred to [facility] for symptom management.  IR was consulted to evaluate for possible kyphoplasty.  MRI of the lumbar spine showed acute compression fracture involving L2 vertebral body up to 20% height loss and trace 2 mm bony retropulsion.  Acute to subacute compression fracture involving L4.  Degenerative spondylosis at L3-4, L4-5. IR was consulted    Subjective   Patient seen and examined, still complains of weakness.  Pain has improved.   Assessment/Plan:     L2/L4 vertebral compression fracture -MRI showed subacute compression fracture involving L4, acute compression fracture involving L2 -Patient was initially waiting for insurance approval -Insurance approval was obtained and patient underwent kyphoplasty at L2 - L4 compression fracture was not amenable for kyphoplasty -PT evaluation obtained, recommend skilled nursing facility for rehab -Pain well-controlled   Hypertension -Blood pressure is stable -Started on  amlodipine 5 mg daily  Anxiety -Continue Lexapro  Chronic back pain -Continue pain control  Mild thrombocytopenia -Platelets improved  Hyponatremia -Improved -Likely from poor p.o. intake -Serum osmolality 283  Medications     amLODipine  5 mg Oral Daily   artificial tears  1 Application Both Eyes QHS   docusate sodium  100 mg Oral BID   enoxaparin (LOVENOX)  injection  40 mg Subcutaneous Q24H   escitalopram  10 mg Oral Daily   lidocaine  1 patch Transdermal Q24H   multivitamin with minerals  1 tablet Oral Q lunch   polyvinyl alcohol  1 drop Both Eyes QID   senna-docusate  1 tablet Oral QHS     Data Reviewed:   CBG:  No results for input(s): "GLUCAP" in the last 168 hours.  SpO2: 90 % O2 Flow Rate (L/min): 2 L/min    Vitals:   06/20/22 1230 06/20/22 1432 06/20/22 1934 06/21/22 0738  BP: (!) 145/76 130/69 (!) 123/52 (!) 144/66  Pulse: 70 64 67 66  Resp: (!) 21 17 18 17   Temp:  97.8 F (36.6 C) 98.3 F (36.8 C) 98.4 F (36.9 C)  TempSrc:  Oral Oral Oral  SpO2: 95% 97% 94% 90%  Weight:      Height:          Data Reviewed:  Basic Metabolic Panel: Recent Labs  Lab 06/16/22 0336 06/17/22 0327 06/19/22 0051 06/20/22 0145  NA 129* 131* 133*  --   K 4.2 4.3 4.2  --   CL 94* 99 101  --   CO2 27 23 25   --   GLUCOSE 105* 98 101*  --   BUN 11 13 14   --   CREATININE 0.71 0.72 0.67 0.64  CALCIUM 8.5* 8.3* 8.4*  --     CBC: Recent Labs  Lab 06/16/22 0336 06/19/22 0051  WBC 6.1 6.4  NEUTROABS 3.9 4.5  HGB 12.6 12.0  HCT 37.3 34.9*  MCV 101.6* 100.9*  PLT 145* 186    LFT No results for input(s): "AST", "ALT", "ALKPHOS", "BILITOT", "PROT", "  ALBUMIN" in the last 168 hours.    Antibiotics: Anti-infectives (From admission, onward)    Start     Dose/Rate Route Frequency Ordered Stop   06/20/22 1345  ceFAZolin (ANCEF) IVPB 2g/100 mL premix       Note to Pharmacy: Give in IR for prior to procedure. DO NOT GIVE ON THE FLOOR!  Give in IR for prior to procedure. DO NOT GIVE ON THE FLOOR!   2 g 200 mL/hr over 30 Minutes Intravenous  Once 06/20/22 1258 06/20/22 1428   06/18/22 1430  ceFAZolin (ANCEF) IVPB 2g/100 mL premix        2 g 200 mL/hr over 30 Minutes Intravenous To Radiology 06/18/22 1339 06/20/22 1217        DVT prophylaxis: Lovenox  Code Status: DNR  Family Communication: Discussed with patient's  daughter at bedside   CONSULTS interventional radiology   Objective    Physical Examination:  Appears in no acute distress S1-S2, regular, no murmur auscultated Lungs clear to auscultation bilaterally Abdomen is soft, nontender, no organomegaly   Status is: Inpatient:             [name]   [facility] If 7PM-7AM, please contact night-coverage at [email or web address], Office  [phone]   06/21/2022, 2:52 PM  LOS: 5 days

## 2022-06-21 NOTE — Progress Notes (Signed)
Spoke to pt, pt's dtr Tish, and pt's SIL Joneen Boers re updated recommendation for SNF. Pt and family agreeable to SNF for STR, requesting [facility] where pt has been in the past. Reviewed SNF placement process and answered questions. Will need to pursue [facility] once a facility is chosen. SW will provide updates as available.   Wandra Feinstein, MSW, LCSW [phone] (coverage)

## 2022-06-21 NOTE — Progress Notes (Signed)
Physical Therapy Treatment Patient Details Name: Lauren Davidson MRN: [id number] DOB: Jan 02, 1922 Today's Date: 06/21/2022   History of Present Illness Pt is a 89 y/o female who presents from independent living facility with new onset low back pain. Pt denies recent fall or trauma. CT revealed L2 compression fracture. TLSO recommended for comfort.  PMH: hyperlipidemia, hypertension, chronic back pain, atraumatic L4 compression fracture in May 2023.    PT Comments    Pt was received sitting in recliner chair with family present and agreeable to therapy. Pt was able to complete curb step with min A for RW management. Pt was able to increase gait distance this session with min guard for safety, however required heavy verbal and tactile cues for upright posture. Pt with complaints of discomfort on her bottom and noted skin redness and irritation upon observation, so a pressure relief air cushion and ointment were applied. After conversation with pt, her family, and discussing with supervising PT Lynnell Jude, they agreed to change disposition to low intensity post-acute rehab before returning home due to pt requiring up to min A and not being able to manage mobility needs independently at this time. Pt continues to benefit from PT services to progress toward functional mobility goals.    Recommendations for follow up therapy are one component of a multi-disciplinary discharge planning process, led by the attending physician.  Recommendations may be updated based on patient status, additional functional criteria and insurance authorization.  Follow Up Recommendations  Skilled nursing-short term rehab (<3 hours/day)     Assistance Recommended at Discharge Frequent or constant Supervision/Assistance  Patient can return home with the following A little help with walking and/or transfers;A little help with bathing/dressing/bathroom;Assistance with cooking/housework;Assist for transportation;Help with stairs or  ramp for entrance   Equipment Recommendations  Rolling walker (2 wheels)    Recommendations for Other Services       Precautions / Restrictions Precautions Precautions: Fall;Back Precaution Booklet Issued: Yes (comment) Precaution Comments: handout given to reinforce Restrictions Weight Bearing Restrictions: No     Mobility  Bed Mobility               General bed mobility comments: Pt sitting in recliner on arrival    Transfers Overall transfer level: Needs assistance Equipment used: Rolling walker (2 wheels) Transfers: Sit to/from Stand Sit to Stand: Min guard           General transfer comment: Pt required increased time and cues for safe hand placement    Ambulation/Gait Ambulation/Gait assistance: Min guard Gait Distance (Feet): 40 Feet Assistive device: Rolling walker (2 wheels) Gait Pattern/deviations: Decreased stride length, Trunk flexed, Step-to pattern, Step-through pattern Gait velocity: decreased     General Gait Details: Pt initially demonstrating slow step-to pattern progressing to step-through pattern with heavy cues for upright posture throughout.   Stairs Stairs: Yes Stairs assistance: Min assist Stair Management: With walker Number of Stairs: 1 General stair comments: Pt able to complete 1 curb step with min A for RW management and heavy cues for sequencing.        Balance Overall balance assessment: Needs assistance Sitting-balance support: No upper extremity supported Sitting balance-Leahy Scale: Fair Sitting balance - Comments: sitting in recliner   Standing balance support: Bilateral upper extremity supported, During functional activity, Reliant on assistive device for balance Standing balance-Leahy Scale: Poor Standing balance comment: with RW support  Cognition Arousal/Alertness: Awake/alert Behavior During Therapy: WFL for tasks assessed/performed Overall Cognitive Status:  Within Functional Limits for tasks assessed                                          Exercises      General Comments General comments (skin integrity, edema, etc.): Noticed redness and skin irritation on bottom and pt with complaints of discomfort. Geomat pressure relief air cushion put in recliner and ointment applied to pt's bottom.      Pertinent Vitals/Pain Pain Assessment Pain Assessment: Faces Faces Pain Scale: Hurts little more Pain Location: L wrist Pain Descriptors / Indicators: Guarding, Sore Pain Intervention(s): Limited activity within patient's tolerance, Monitored during session     PT Goals (current goals can now be found in the care plan section) Acute Rehab PT Goals Patient Stated Goal: Be able to go back to her independent living apartment. PT Goal Formulation: With patient/family Time For Goal Achievement: 06/27/22 Potential to Achieve Goals: Good Progress towards PT goals: Progressing toward goals    Frequency    Min 5X/week      PT Plan Discharge plan needs to be updated       AM-PAC PT "6 Clicks" Mobility   Outcome Measure  Help needed turning from your back to your side while in a flat bed without using bedrails?: A Little (not formally assessed) Help needed moving from lying on your back to sitting on the side of a flat bed without using bedrails?: A Lot (not formally assessed) Help needed moving to and from a bed to a chair (including a wheelchair)?: A Little Help needed standing up from a chair using your arms (e.g., wheelchair or bedside chair)?: A Little Help needed to walk in hospital room?: A Little Help needed climbing 3-5 steps with a railing? : A Lot 6 Click Score: 16    End of Session Equipment Utilized During Treatment: Gait belt Activity Tolerance: Patient limited by fatigue Patient left: in chair;with chair alarm set;with family/visitor present;with call bell/phone within reach   PT Visit Diagnosis:  Unsteadiness on feet (R26.81);Pain;Difficulty in walking, not elsewhere classified (R26.2)     Time: 3154-0086 PT Time Calculation (min) (ACUTE ONLY): 30 min  Charges:  $Gait Training: 23-37 mins                     Michelle Nasuti, PTA Acute Rehabilitation Services Secure Chat Preferred  [phone]    Michelle Nasuti 06/21/2022, 12:51 PM

## 2022-06-21 NOTE — NC FL2 (Signed)
[place] LEVEL OF CARE FORM     IDENTIFICATION  Patient Name: Lauren Davidson Birthdate: 1921/12/01 Sex: female Admission Date (Current Location): 06/12/2022  [facility] and [place] Number:  Herbalist and Address:  [facility] [place] [place] [address] [place], [place] [address]      Provider Number: [id number]  Attending Physician Name and Address:  Lauren Hillock, MD  Relative Name and Phone Number:       Current Level of Care: Hospital Recommended Level of Care: [place] Prior Approval Number:    Date Approved/Denied:   PASRR Number: [id number]  Discharge Plan: SNF    Current Diagnoses: Patient Active Problem List   Diagnosis Date Noted   Closed compression fracture of L2 lumbar vertebra, initial encounter ([place]) 06/16/2022   Lumbar compression fracture ([employer]) 06/12/2022   Back pain 09/17/2021   Closed compression fracture of L4 lumbar vertebra, initial encounter ([place]) 09/17/2021   Lumbar spinal stenosis 09/17/2021   HTN (hypertension) 09/17/2021   HLD (hyperlipidemia) 09/17/2021   Gastroesophageal reflux disease without esophagitis 03/01/2019   Sjogren's syndrome ([place]) 05/18/2015   Chronic kidney disease (CKD), stage II (mild) 05/18/2015   Generalized anxiety disorder 05/18/2015   Osteoporosis 05/18/2015   Status post intraocular lens implant 11/14/2011    Orientation RESPIRATION BLADDER Height & Weight     Self, Time, Situation, Place  O2 Incontinent Weight: 139 lb 15.9 oz (63.5 kg) Height:  5\' 4"  (162.6 cm)  BEHAVIORAL SYMPTOMS/MOOD NEUROLOGICAL BOWEL NUTRITION STATUS      Continent    AMBULATORY STATUS COMMUNICATION OF NEEDS Skin   Extensive Assist Verbally Surgical wounds                       Personal Care Assistance Level of Assistance  Bathing, Feeding, Dressing Bathing Assistance: Maximum assistance Feeding assistance: Limited assistance Dressing Assistance: Maximum assistance      Functional Limitations Info  Sight, Hearing, Speech Sight Info: Adequate Hearing Info: Adequate Speech Info: Adequate    SPECIAL CARE FACTORS FREQUENCY  PT (By licensed PT), OT (By licensed OT)                    Contractures Contractures Info: Not present    Additional Factors Info                  Current Medications (06/21/2022):  This is the current hospital active medication list Current Facility-Administered Medications  Medication Dose Route Frequency Provider Last Rate Last Admin   acetaminophen (TYLENOL) tablet 650 mg  650 mg Oral Q6H PRN Lauren Pap N, DO   650 mg at 06/18/22 1216   amLODipine (NORVASC) tablet 5 mg  5 mg Oral Daily Lauren Hillock, MD   5 mg at 06/21/22 0845   artificial tears (LACRILUBE) ophthalmic ointment 1 Application  1 Application Both Eyes QHS Lauren Poisson, MD   1 Application at 17/51/02 2201   docusate sodium (COLACE) capsule 100 mg  100 mg Oral BID Lauren Poisson, MD   100 mg at 06/21/22 0846   enoxaparin (LOVENOX) injection 40 mg  40 mg Subcutaneous Q24H Lauren Hillock, MD   40 mg at 06/21/22 0844   escitalopram (LEXAPRO) tablet 10 mg  10 mg Oral Daily Lauren Poisson, MD   10 mg at 06/18/22 1030   lidocaine (LIDODERM) 5 % 1 patch  1 patch Transdermal Q24H Lauren Pap N, DO   1 patch  at 06/21/22 0848   melatonin tablet 3 mg  3 mg Oral QHS PRN Lauren Memos, DO       multivitamin with minerals tablet 1 tablet  1 tablet Oral Q lunch Lauren Davidson, [place] N, DO   1 tablet at 06/21/22 1200   polyethylene glycol (MIRALAX / GLYCOLAX) packet 17 g  17 g Oral Daily PRN Lauren Pap N, DO       polyvinyl alcohol (LIQUIFILM TEARS) 1.4 % ophthalmic solution 1 drop  1 drop Both Eyes QID Lauren Poisson, MD   1 drop at 06/21/22 1200   prochlorperazine (COMPAZINE) injection 5 mg  5 mg Intravenous Q6H PRN Lauren Memos, DO       senna-docusate (Senokot-S) tablet 1 tablet  1 tablet Oral QHS Lauren Pap N, DO   1 tablet at 06/20/22 2201     Discharge  Medications: Please see discharge summary for a list of discharge medications.  Relevant Imaging Results:  Relevant Lab Results:   Additional Information SS# [id number]  Lauren Davidson [place], Lauren Davidson

## 2022-06-22 DIAGNOSIS — S32020A Wedge compression fracture of second lumbar vertebra, initial encounter for closed fracture: Secondary | ICD-10-CM | POA: Diagnosis not present

## 2022-06-22 DIAGNOSIS — S32020D Wedge compression fracture of second lumbar vertebra, subsequent encounter for fracture with routine healing: Secondary | ICD-10-CM | POA: Diagnosis not present

## 2022-06-22 NOTE — Progress Notes (Signed)
[facility]  PROGRESS NOTE  Lauren Davidson ZHY:[id number] DOB: 1921-08-24 DOA: 06/12/2022 PCP: Charlane Ferretti, MD   Brief HPI:   89 year old female with medical history of hyperlipidemia, hypertension, chronic back pain, atraumatic L4 compression fracture in May 2023 who initially presented to [facility] ED from independent living facility with complaint of new onset low back pain over the past few days. CT of lumbar spine revealed L2 compression fracture.  EDP discussed the case with neurosurgery on-call Dr. Marcello Moores who recommended TLSO when out of bed for comfort.  Patient was transferred to [facility] for symptom management.  IR was consulted to evaluate for possible kyphoplasty.  MRI of the lumbar spine showed acute compression fracture involving L2 vertebral body up to 20% height loss and trace 2 mm bony retropulsion.  Acute to subacute compression fracture involving L4.  Degenerative spondylosis at L3-4, L4-5. IR was consulted    Subjective   Patient seen and examined, pain has improved since kyphoplasty.  Plan to go to skilled nursing facility for rehab.   Assessment/Plan:     L2/L4 vertebral compression fracture -MRI showed subacute compression fracture involving L4, acute compression fracture involving L2 -Patient was initially waiting for insurance approval -Insurance approval was obtained and patient underwent kyphoplasty at L2 - L4 compression fracture was not amenable for kyphoplasty -PT evaluation obtained, recommend skilled nursing facility for rehab -Pain well-controlled   Hypertension -Blood pressure is stable -Started on  amlodipine 5 mg daily  Anxiety -Continue Lexapro  Chronic back pain -Continue pain control  Mild thrombocytopenia -Platelets improved  Hyponatremia -Improved -Likely from poor p.o. intake -Serum osmolality 283  Medications     amLODipine  5 mg Oral Daily   artificial tears  1 Application Both Eyes QHS   docusate sodium  100  mg Oral BID   enoxaparin (LOVENOX) injection  40 mg Subcutaneous Q24H   escitalopram  10 mg Oral Daily   lidocaine  1 patch Transdermal Q24H   multivitamin with minerals  1 tablet Oral Q lunch   polyvinyl alcohol  1 drop Both Eyes QID   senna-docusate  1 tablet Oral QHS     Data Reviewed:   CBG:  No results for input(s): "GLUCAP" in the last 168 hours.  SpO2: 97 % O2 Flow Rate (L/min): 2 L/min    Vitals:   06/21/22 1300 06/21/22 1944 06/22/22 0357 06/22/22 0805  BP: (!) 187/94 131/63 137/60 (!) 149/78  Pulse: 69 66 (!) 52 65  Resp: 18 18 18 17   Temp: 98.2 F (36.8 C) 97.8 F (36.6 C)  97.6 F (36.4 C)  TempSrc: Oral Oral    SpO2: 98% 96% 92% 97%  Weight:      Height:          Data Reviewed:  Basic Metabolic Panel: Recent Labs  Lab 06/16/22 0336 06/17/22 0327 06/19/22 0051 06/20/22 0145  NA 129* 131* 133*  --   K 4.2 4.3 4.2  --   CL 94* 99 101  --   CO2 27 23 25   --   GLUCOSE 105* 98 101*  --   BUN 11 13 14   --   CREATININE 0.71 0.72 0.67 0.64  CALCIUM 8.5* 8.3* 8.4*  --     CBC: Recent Labs  Lab 06/16/22 0336 06/19/22 0051  WBC 6.1 6.4  NEUTROABS 3.9 4.5  HGB 12.6 12.0  HCT 37.3 34.9*  MCV 101.6* 100.9*  PLT 145* 186    LFT No results for  input(s): "AST", "ALT", "ALKPHOS", "BILITOT", "PROT", "ALBUMIN" in the last 168 hours.    Antibiotics: Anti-infectives (From admission, onward)    Start     Dose/Rate Route Frequency Ordered Stop   06/20/22 1345  ceFAZolin (ANCEF) IVPB 2g/100 mL premix       Note to Pharmacy: Give in IR for prior to procedure. DO NOT GIVE ON THE FLOOR!  Give in IR for prior to procedure. DO NOT GIVE ON THE FLOOR!   2 g 200 mL/hr over 30 Minutes Intravenous  Once 06/20/22 1258 06/20/22 1428   06/18/22 1430  ceFAZolin (ANCEF) IVPB 2g/100 mL premix        2 g 200 mL/hr over 30 Minutes Intravenous To Radiology 06/18/22 1339 06/20/22 1217        DVT prophylaxis: Lovenox  Code Status: DNR  Family Communication:  Discussed with patient's daughter at bedside   CONSULTS interventional radiology   Objective    Physical Examination:  Appears in no acute distress S1-S2, regular, no murmur auscultated Lungs are clear to auscultation bilaterally Abdomen is soft, nontender, no organomegaly   Status is: Inpatient:             [name]   [facility] If 7PM-7AM, please contact night-coverage at [email or web address], Office  [phone]   06/22/2022, 11:29 AM  LOS: 6 days

## 2022-06-23 DIAGNOSIS — S32020A Wedge compression fracture of second lumbar vertebra, initial encounter for closed fracture: Secondary | ICD-10-CM | POA: Diagnosis not present

## 2022-06-23 DIAGNOSIS — S32020D Wedge compression fracture of second lumbar vertebra, subsequent encounter for fracture with routine healing: Secondary | ICD-10-CM | POA: Diagnosis not present

## 2022-06-23 NOTE — Progress Notes (Signed)
[facility]  PROGRESS NOTE  EVAMAE ROWEN XIP:[id number] DOB: [birth year] DOA: 06/12/2022 PCP: Charlane Ferretti, MD   Brief HPI:   89 year old female with medical history of hyperlipidemia, hypertension, chronic back pain, atraumatic L4 compression fracture in May 2023 who initially presented to [facility] ED from independent living facility with complaint of new onset low back pain over the past few days. CT of lumbar spine revealed L2 compression fracture.  EDP discussed the case with neurosurgery on-call Dr. Marcello Moores who recommended TLSO when out of bed for comfort.  Patient was transferred to [facility] for symptom management.  IR was consulted to evaluate for possible kyphoplasty.  MRI of the lumbar spine showed acute compression fracture involving L2 vertebral body up to 20% height loss and trace 2 mm bony retropulsion.  Acute to subacute compression fracture involving L4.  Degenerative spondylosis at L3-4, L4-5. IR was consulted    Subjective   Patient seen and examined, denies any complaints.   Assessment/Plan:     L2/L4 vertebral compression fracture -MRI showed subacute compression fracture involving L4, acute compression fracture involving L2 -Patient was initially waiting for insurance approval -Insurance approval was obtained and patient underwent kyphoplasty at L2 - L4 compression fracture was not amenable for kyphoplasty -PT evaluation obtained, recommend skilled nursing facility for rehab -Pain well-controlled   Hypertension -Blood pressure is stable -Started on  amlodipine 5 mg daily  Anxiety -Continue Lexapro  Chronic back pain -Continue pain control  Mild thrombocytopenia -Platelets improved  Hyponatremia -Improved -Likely from poor p.o. intake -Serum osmolality 283  Medications     amLODipine  5 mg Oral Daily   artificial tears  1 Application Both Eyes QHS   docusate sodium  100 mg Oral BID   enoxaparin (LOVENOX) injection  40 mg  Subcutaneous Q24H   escitalopram  10 mg Oral Daily   lidocaine  1 patch Transdermal Q24H   multivitamin with minerals  1 tablet Oral Q lunch   polyvinyl alcohol  1 drop Both Eyes QID   senna-docusate  1 tablet Oral QHS     Data Reviewed:   CBG:  No results for input(s): "GLUCAP" in the last 168 hours.  SpO2: 93 % O2 Flow Rate (L/min): 2 L/min    Vitals:   06/22/22 1515 06/22/22 1955 06/23/22 0359 06/23/22 0815  BP: 120/60 136/69 136/66 (!) 149/71  Pulse: 61 65 62 66  Resp: 17 18 17 18   Temp: 97.9 F (36.6 C) (!) 97.5 F (36.4 C)  (!) 97.3 F (36.3 C)  TempSrc: Oral   Oral  SpO2: 96% 96% 95% 93%  Weight:      Height:          Data Reviewed:  Basic Metabolic Panel: Recent Labs  Lab 06/17/22 0327 06/19/22 0051 06/20/22 0145  NA 131* 133*  --   K 4.3 4.2  --   CL 99 101  --   CO2 23 25  --   GLUCOSE 98 101*  --   BUN 13 14  --   CREATININE 0.72 0.67 0.64  CALCIUM 8.3* 8.4*  --     CBC: Recent Labs  Lab 06/19/22 0051  WBC 6.4  NEUTROABS 4.5  HGB 12.0  HCT 34.9*  MCV 100.9*  PLT 186    LFT No results for input(s): "AST", "ALT", "ALKPHOS", "BILITOT", "PROT", "ALBUMIN" in the last 168 hours.    Antibiotics: Anti-infectives (From admission, onward)    Start     Dose/Rate Route  Frequency Ordered Stop   06/20/22 1345  ceFAZolin (ANCEF) IVPB 2g/100 mL premix       Note to Pharmacy: Give in IR for prior to procedure. DO NOT GIVE ON THE FLOOR!  Give in IR for prior to procedure. DO NOT GIVE ON THE FLOOR!   2 g 200 mL/hr over 30 Minutes Intravenous  Once 06/20/22 1258 06/20/22 1428   06/18/22 1430  ceFAZolin (ANCEF) IVPB 2g/100 mL premix        2 g 200 mL/hr over 30 Minutes Intravenous To Radiology 06/18/22 1339 06/20/22 1217        DVT prophylaxis: Lovenox  Code Status: DNR  Family Communication: Discussed with patient's daughter at bedside   CONSULTS interventional radiology   Objective    Physical Examination:  General-appears  in no acute distress Heart-S1-S2, regular, no murmur auscultated Lungs-clear to auscultation bilaterally, no wheezing or crackles auscultated Abdomen-soft, nontender, no organomegaly Extremities-no edema in the lower extremities    Status is: Inpatient:         [name]   [facility] If 7PM-7AM, please contact night-coverage at [email or web address], Office  [phone]   06/23/2022, 10:21 AM  LOS: 7 days

## 2022-06-23 NOTE — Progress Notes (Signed)
Physical Therapy Treatment Patient Details Name: Lauren Davidson MRN: [id number] DOB: 05/26/1921 Today's Date: 06/23/2022   History of Present Illness Pt is a 89 y/o female who presents from independent living facility with new onset low back pain. Pt denies recent fall or trauma. CT revealed L2 compression fracture. Pt underwent kyphoplasty 06/20/22. PMH: hyperlipidemia, hypertension, chronic back pain, atraumatic L4 compression fracture in May 2023.    PT Comments    Pt required min guard assist transfers, and min guard assist amb 150' with RW. Cues for posture during gait. Pt in recliner with feet elevated at end of session.    Recommendations for follow up therapy are one component of a multi-disciplinary discharge planning process, led by the attending physician.  Recommendations may be updated based on patient status, additional functional criteria and insurance authorization.  Follow Up Recommendations  Skilled nursing-short term rehab (<3 hours/day) Can patient physically be transported by private vehicle: Yes   Assistance Recommended at Discharge Frequent or constant Supervision/Assistance  Patient can return home with the following A little help with walking and/or transfers;A little help with bathing/dressing/bathroom;Assistance with cooking/housework;Assist for transportation;Help with stairs or ramp for entrance   Equipment Recommendations  Rolling walker (2 wheels)    Recommendations for Other Services       Precautions / Restrictions Precautions Precautions: Fall;Back Restrictions Other Position/Activity Restrictions: TLSO d/c'd following kyphoplasty     Mobility  Bed Mobility               General bed mobility comments: Pt received on BSC    Transfers Overall transfer level: Needs assistance Equipment used: Rolling walker (2 wheels) Transfers: Sit to/from Stand Sit to Stand: Min guard   Step pivot transfers: Min guard       General transfer comment:  STS x 3 trials. Min guard for safety. Increased time to power up.    Ambulation/Gait Ambulation/Gait assistance: Min guard Gait Distance (Feet): 150 Feet Assistive device: Rolling walker (2 wheels) Gait Pattern/deviations: Step-through pattern, Decreased stride length Gait velocity: decreased Gait velocity interpretation: <1.31 ft/sec, indicative of household ambulator   General Gait Details: slow, steady gait with RW. Cues for posture. Standing rest break x 2   Stairs             Wheelchair Mobility    Modified Rankin (Stroke Patients Only)       Balance Overall balance assessment: Needs assistance Sitting-balance support: Feet supported, No upper extremity supported Sitting balance-Leahy Scale: Fair     Standing balance support: Bilateral upper extremity supported, Reliant on assistive device for balance, During functional activity Standing balance-Leahy Scale: Poor                              Cognition Arousal/Alertness: Awake/alert Behavior During Therapy: WFL for tasks assessed/performed Overall Cognitive Status: Within Functional Limits for tasks assessed                                 General Comments: Pt very HOH.        Exercises      General Comments        Pertinent Vitals/Pain Pain Assessment Pain Assessment: Faces Faces Pain Scale: Hurts little more Pain Location: LUE Pain Descriptors / Indicators: Sore Pain Intervention(s): Monitored during session, Repositioned    Home Living  Prior Function            PT Goals (current goals can now be found in the care plan section) Acute Rehab PT Goals Patient Stated Goal: Be able to go back to her independent living apartment. Progress towards PT goals: Progressing toward goals    Frequency    Min 3X/week      PT Plan Current plan remains appropriate    Co-evaluation              AM-PAC PT "6 Clicks" Mobility    Outcome Measure  Help needed turning from your back to your side while in a flat bed without using bedrails?: A Little Help needed moving from lying on your back to sitting on the side of a flat bed without using bedrails?: A Lot Help needed moving to and from a bed to a chair (including a wheelchair)?: A Little Help needed standing up from a chair using your arms (e.g., wheelchair or bedside chair)?: A Little Help needed to walk in hospital room?: A Little Help needed climbing 3-5 steps with a railing? : A Lot 6 Click Score: 16    End of Session Equipment Utilized During Treatment: Gait belt Activity Tolerance: Patient tolerated treatment well Patient left: in chair;with call bell/phone within reach Nurse Communication: Mobility status PT Visit Diagnosis: Unsteadiness on feet (R26.81);Pain;Difficulty in walking, not elsewhere classified (R26.2) Pain - Right/Left: Left Pain - part of body: Arm     Time: 7564-3329 PT Time Calculation (min) (ACUTE ONLY): 14 min  Charges:  $Gait Training: 8-22 mins                     Gloriann Loan., PT  Office # [phone]    Lorriane Shire 06/23/2022, 9:36 AM

## 2022-06-23 NOTE — Progress Notes (Signed)
Mobility Specialist: Progress Note   06/23/22 1754  Mobility  Activity Ambulated with assistance in hallway  Level of Assistance Minimal assist, patient does 75% or more  Assistive Device Front wheel walker  Distance Ambulated (ft) 150 ft  Activity Response Tolerated well  Mobility Referral Yes  $Mobility charge 1 Mobility   Pt received in the bed and agreeable to mobility. MinA with bed mobility as well as to stand. Pt with good initiation of log roll technique. No c/o throughout ambulation. Pt to the chair after session with call bell and phone in her lap.   Zaleski Khale [place] Mobility Specialist Please contact via SecureChat or Rehab office at [phone]

## 2022-06-24 DIAGNOSIS — S32020A Wedge compression fracture of second lumbar vertebra, initial encounter for closed fracture: Secondary | ICD-10-CM | POA: Diagnosis not present

## 2022-06-24 DIAGNOSIS — S32020D Wedge compression fracture of second lumbar vertebra, subsequent encounter for fracture with routine healing: Secondary | ICD-10-CM | POA: Diagnosis not present

## 2022-06-24 NOTE — Plan of Care (Signed)

## 2022-06-24 NOTE — Progress Notes (Signed)
Physical Therapy Treatment Patient Details Name: Lauren Davidson MRN: [id number] DOB: [birth year] Today's Date: 06/24/2022   History of Present Illness Pt is a 89 y/o female who presents from independent living facility with new onset low back pain. Pt denies recent fall or trauma. CT revealed L2 compression fracture. Pt underwent kyphoplasty 06/20/22. PMH: hyperlipidemia, hypertension, chronic back pain, atraumatic L4 compression fracture in May 2023.    PT Comments    Pt progressing towards physical therapy goals. Pt ambulated to the bathroom to void, and required min guard assist to min assist for balance as she managed her brief. Encouraged pt to start ambulating to the bathroom with staff assist instead of relying on the [place], as pt will not have the [place] at SNF. Overall she tolerated treatment well without complaints of pain, and was returned to the chair with chair alarm set at end of session. Will continue to follow and progress as able per POC.    Recommendations for follow up therapy are one component of a multi-disciplinary discharge planning process, led by the attending physician.  Recommendations may be updated based on patient status, additional functional criteria and insurance authorization.  Follow Up Recommendations  Skilled nursing-short term rehab (<3 hours/day) Can patient physically be transported by private vehicle: Yes   Assistance Recommended at Discharge Frequent or constant Supervision/Assistance  Patient can return home with the following A little help with walking and/or transfers;A little help with bathing/dressing/bathroom;Assistance with cooking/housework;Assist for transportation;Help with stairs or ramp for entrance   Equipment Recommendations  Rolling walker (2 wheels)    Recommendations for Other Services       Precautions / Restrictions Precautions Precautions: Fall;Back Precaution Booklet Issued: Yes (comment) Precaution Comments: handout given  to reinforce Spinal Brace Comments: TLSO d/c'd following kyphoplasty Restrictions Weight Bearing Restrictions: No     Mobility  Bed Mobility               General bed mobility comments: Pt received sitting up in the recliner.    Transfers Overall transfer level: Needs assistance Equipment used: Rolling walker (2 wheels) Transfers: Sit to/from Stand Sit to Stand: Min assist           General transfer comment: VC's for hand placement on seated surface for safety. Occasional assist required for balance support and boost to achieve full stand. Pt stood from recliner and BSC.    Ambulation/Gait Ambulation/Gait assistance: Min guard Gait Distance (Feet): 150 Feet Assistive device: Rolling walker (2 wheels) Gait Pattern/deviations: Step-through pattern, Decreased stride length Gait velocity: decreased Gait velocity interpretation: 1.31 - 2.62 ft/sec, indicative of limited community ambulator   General Gait Details: Slow but generally steady with RW for support. VC's throughout for improved posture, closer walker proximity, and forward gaze.   Stairs             Wheelchair Mobility    Modified Rankin (Stroke Patients Only)       Balance Overall balance assessment: Needs assistance Sitting-balance support: Feet supported, No upper extremity supported Sitting balance-Leahy Scale: Fair Sitting balance - Comments: sitting in recliner Postural control: Posterior lean Standing balance support: Bilateral upper extremity supported, Reliant on assistive device for balance, During functional activity Standing balance-Leahy Scale: Poor Standing balance comment: with RW support                            Cognition Arousal/Alertness: Awake/alert Behavior During Therapy: WFL for tasks assessed/performed Overall Cognitive Status: Within  Functional Limits for tasks assessed                                          Exercises       General Comments        Pertinent Vitals/Pain Pain Assessment Pain Assessment: Faces Faces Pain Scale: Hurts a little bit Pain Location: back Pain Descriptors / Indicators: Sore Pain Intervention(s): Limited activity within patient's tolerance, Monitored during session, Repositioned    Home Living                          Prior Function            PT Goals (current goals can now be found in the care plan section) Acute Rehab PT Goals Patient Stated Goal: Be able to go back to her independent living apartment. PT Goal Formulation: With patient/family Time For Goal Achievement: 06/27/22 Potential to Achieve Goals: Good Progress towards PT goals: Progressing toward goals    Frequency    Min 3X/week      PT Plan Current plan remains appropriate    Co-evaluation              AM-PAC PT "6 Clicks" Mobility   Outcome Measure  Help needed turning from your back to your side while in a flat bed without using bedrails?: A Little Help needed moving from lying on your back to sitting on the side of a flat bed without using bedrails?: A Lot Help needed moving to and from a bed to a chair (including a wheelchair)?: A Little Help needed standing up from a chair using your arms (e.g., wheelchair or bedside chair)?: A Little Help needed to walk in hospital room?: A Little Help needed climbing 3-5 steps with a railing? : A Lot 6 Click Score: 16    End of Session Equipment Utilized During Treatment: Gait belt Activity Tolerance: Patient tolerated treatment well Patient left: in chair;with call bell/phone within reach;with chair alarm set;with family/visitor present Nurse Communication: Mobility status PT Visit Diagnosis: Unsteadiness on feet (R26.81);Pain;Difficulty in walking, not elsewhere classified (R26.2) Pain - part of body:  (back)     Time: 8270-7867 PT Time Calculation (min) (ACUTE ONLY): 28 min  Charges:  $Gait Training: 23-37 mins                      Rolinda Roan, PT, DPT Acute Rehabilitation Services Secure Chat Preferred Office: [phone]    Thelma Comp 06/24/2022, 1:25 PM

## 2022-06-24 NOTE — Progress Notes (Signed)
Occupational Therapy Treatment Patient Details Name: Lauren Davidson MRN: [id number] DOB: 06-Aug-1921 Today's Date: 06/24/2022   History of present illness Pt is a 89 y/o female who presents from independent living facility with new onset low back pain. Pt denies recent fall or trauma. CT revealed L2 compression fracture. Pt underwent kyphoplasty 06/20/22. PMH: hyperlipidemia, hypertension, chronic back pain, atraumatic L4 compression fracture in May 2023.   OT comments  Ms Lauren Davidson is making steady progress with mobility and ADL tasks as noted below. Rehab at SNF is recommended to maximize functional level of independence to facilitate return to ILF. Will continue ot follow.    Recommendations for follow up therapy are one component of a multi-disciplinary discharge planning process, led by the attending physician.  Recommendations may be updated based on patient status, additional functional criteria and insurance authorization.    Follow Up Recommendations  Skilled nursing-short term rehab (<3 hours/day)     Assistance Recommended at Discharge Frequent or constant Supervision/Assistance  Patient can return home with the following  A little help with walking and/or transfers;A little help with bathing/dressing/bathroom;Assistance with feeding;Assist for transportation;Help with stairs or ramp for entrance   Equipment Recommendations  BSC/3in1    Recommendations for Other Services      Precautions / Restrictions Precautions Precautions: Fall;Back Precaution Booklet Issued: Yes (comment) Precaution Comments: handout given to reinforce Spinal Brace Comments: TLSO d/c'd following kyphoplasty   - for comfort only Restrictions Weight Bearing Restrictions: No       Mobility Bed Mobility               General bed mobility comments: OOB in chair    Transfers Overall transfer level: Needs assistance Equipment used: Rolling walker (2 wheels) Transfers: Sit to/from Stand Sit to  Stand: Min assist           General transfer comment: good recall of hand placement     Balance Overall balance assessment: Needs assistance Sitting-balance support: Feet supported, No upper extremity supported Sitting balance-Leahy Scale: Fair Sitting balance - Comments: sitting in recliner Postural control: Posterior lean Standing balance support: Bilateral upper extremity supported, Reliant on assistive device for balance, During functional activity Standing balance-Leahy Scale: Poor Standing balance comment: with RW support                           ADL either performed or assessed with clinical judgement   ADL Overall ADL's : Needs assistance/impaired     Grooming: Set up       Lower Body Bathing: Minimal assistance;Sit to/from stand       Lower Body Dressing: Minimal assistance   Toilet Transfer: Min guard;Ambulation;Regular Toilet;Rolling walker (2 wheels);Grab bars   Toileting- Clothing Manipulation and Hygiene: Min guard;Sit to/from stand       Functional mobility during ADLs: Min guard;Cueing for safety;Rolling walker (2 wheels)      Extremity/Trunk Assessment Upper Extremity Assessment Upper Extremity Assessment: Generalized weakness   Lower Extremity Assessment Lower Extremity Assessment: Defer to PT evaluation        Vision   Vision Assessment?:  (wears glasses)   Perception     Praxis      Cognition Arousal/Alertness: Awake/alert Behavior During Therapy: WFL for tasks assessed/performed Overall Cognitive Status: Within Functional Limits for tasks assessed  Exercises      Shoulder Instructions       General Comments      Pertinent Vitals/ Pain       Pain Assessment Pain Assessment: Faces Faces Pain Scale: Hurts a little bit Pain Location: back Pain Descriptors / Indicators: Sore Pain Intervention(s): Limited activity within patient's tolerance  Home  Living                                          Prior Functioning/Environment              Frequency  Min 2X/week        Progress Toward Goals  OT Goals(current goals can now be found in the care plan section)  Progress towards OT goals: Progressing toward goals  Acute Rehab OT Goals Patient Stated Goal: to go to rehab at [facility] OT Goal Formulation: With patient Time For Goal Achievement: 06/28/22 Potential to Achieve Goals: Good ADL Goals Pt Will Perform Upper Body Bathing: with modified independence;sitting Pt Will Perform Lower Body Bathing: with set-up;sit to/from stand;sitting/lateral leans Pt Will Perform Upper Body Dressing: with modified independence;sitting Pt Will Transfer to Toilet: with modified independence;bedside commode;ambulating Pt Will Perform Toileting - Clothing Manipulation and hygiene: with modified independence;sitting/lateral leans;sit to/from stand  Plan Discharge plan remains appropriate    Co-evaluation                 AM-PAC OT "6 Clicks" Daily Activity     Outcome Measure   Help from another person eating meals?: None Help from another person taking care of personal grooming?: A Little Help from another person toileting, which includes using toliet, bedpan, or urinal?: A Little Help from another person bathing (including washing, rinsing, drying)?: A Little Help from another person to put on and taking off regular upper body clothing?: A Little Help from another person to put on and taking off regular lower body clothing?: A Little 6 Click Score: 19    End of Session Equipment Utilized During Treatment: Gait belt;Rolling walker (2 wheels)  OT Visit Diagnosis: Muscle weakness (generalized) (M62.81);Repeated falls (R29.6)   Activity Tolerance Patient tolerated treatment well   Patient Left in chair;with call bell/phone within reach;with chair alarm set   Nurse Communication Mobility status         Time: 5400-8676 OT Time Calculation (min): 35 min  Charges: OT General Charges $OT Visit: 1 Visit OT Treatments $Self Care/Home Management : 23-37 mins  Maurie Boettcher, OT/L   Acute OT Clinical Specialist [place] Pager [phone] Office [phone]   [facility] 06/24/2022, 3:16 PM

## 2022-06-24 NOTE — TOC Progression Note (Addendum)
Transition of Care [facility]) - Progression Note    Patient Details  Name: Lauren Davidson MRN: [id number] Date of Birth: [birth year]  Transition of Care [facility]) CM/SW [place], [place] Phone Number: 06/24/2022, 10:13 AM  Clinical Narrative:     CSW contacted [facility] SNF liaison and is informed they are reviewing pt to see if they can offer as well as if they have bed availability. Auth will need to be started once bed offer is confirmed. TOC will continue to coordinate with [facility] for decision.   1130: [facility] confirmed they can offer a bed though have an out of pocket cost of $43/day that insurance does not cover. Spoke with pt and pt's family member, Lauren Davidson; this is their preferred facility though pt states CSW needs to speak with her SIL, Lauren Davidson, to discuss out of pocket fee. CSW called Lauren Davidson and updated him of offer and fee. He is concerned about fee and discusses alternative SNF's with CSW. He plans to come to hospital to discuss options with pt and will call CSW with decision.   1255: Lauren Davidson called CSW back and confirmed they would like [facility] and can manage the out of pocket daily fee. CSW explained insurance auth process and plan for DC once [name] is received. TOC CMA to start insurance auth.   1340: CMA submitted insurance auth request; GYI#[id number]  Auth Status: Pending  [facility] provided info for when pt's [name] is approved and she is ready for DC.  Room#103  # for report: [phone]  Expected Discharge Plan: [place] Barriers to Discharge: Continued Medical Work up  Expected Discharge Plan and Services   Discharge Planning Services: CM Consult   Living arrangements for the past 2 months: [place]: PT [facility] Agency: Other - See comment ([facility]/ in house therapy @ [place])         Social Determinants of Health (SDOH) Interventions SDOH Screenings   Food  Insecurity: No Food Insecurity (06/13/2022)  Housing: Low Risk  (06/13/2022)  Transportation Needs: No Transportation Needs (06/13/2022)  Utilities: Not At Risk (06/13/2022)  Tobacco Use: Low Risk  (06/20/2022)    Readmission Risk Interventions     No data to display

## 2022-06-24 NOTE — Progress Notes (Signed)
[facility]  PROGRESS NOTE  Lauren Davidson GXQ:[id number] DOB: 1921/07/08 DOA: 06/12/2022 PCP: Charlane Ferretti, MD   Brief HPI:   89 year old female with medical history of hyperlipidemia, hypertension, chronic back pain, atraumatic L4 compression fracture in May 2023 who initially presented to [facility] ED from independent living facility with complaint of new onset low back pain over the past few days. CT of lumbar spine revealed L2 compression fracture.  EDP discussed the case with neurosurgery on-call Dr. Marcello Moores who recommended TLSO when out of bed for comfort.  Patient was transferred to [facility] for symptom management.  IR was consulted to evaluate for possible kyphoplasty.  MRI of the lumbar spine showed acute compression fracture involving L2 vertebral body up to 20% height loss and trace 2 mm bony retropulsion.  Acute to subacute compression fracture involving L4.  Degenerative spondylosis at L3-4, L4-5. IR was consulted    Subjective   Patient seen and examined, pain well-controlled.  Awaiting bed at skilled nursing facility.   Assessment/Plan:     L2/L4 vertebral compression fracture -MRI showed subacute compression fracture involving L4, acute compression fracture involving L2 -Patient was initially waiting for insurance approval -Insurance approval was obtained and patient underwent kyphoplasty at L2 - L4 compression fracture was not amenable for kyphoplasty -PT evaluation obtained, recommend skilled nursing facility for rehab -Pain well-controlled   Hypertension -Blood pressure is stable -Started on  amlodipine 5 mg daily  Anxiety -Continue Lexapro  Chronic back pain -Continue pain control  Mild thrombocytopenia -Platelets improved  Hyponatremia -Improved -Likely from poor p.o. intake -Serum osmolality 283  Medications     amLODipine  5 mg Oral Daily   artificial tears  1 Application Both Eyes QHS   docusate sodium  100 mg Oral BID    enoxaparin (LOVENOX) injection  40 mg Subcutaneous Q24H   escitalopram  10 mg Oral Daily   lidocaine  1 patch Transdermal Q24H   multivitamin with minerals  1 tablet Oral Q lunch   polyvinyl alcohol  1 drop Both Eyes QID   senna-docusate  1 tablet Oral QHS     Data Reviewed:   CBG:  No results for input(s): "GLUCAP" in the last 168 hours.  SpO2: 97 % O2 Flow Rate (L/min): 2 L/min    Vitals:   06/23/22 1640 06/23/22 1947 06/24/22 0433 06/24/22 0833  BP: (!) 113/54 (!) 119/54 (!) 150/65 (!) 148/68  Pulse: 66 (!) 59 62 67  Resp: 16 20 20 18   Temp: 98.3 F (36.8 C) 98 F (36.7 C)  98.3 F (36.8 C)  TempSrc: Oral   Oral  SpO2: 95% 96% 97% 97%  Weight:      Height:          Data Reviewed:  Basic Metabolic Panel: Recent Labs  Lab 06/19/22 0051 06/20/22 0145  NA 133*  --   K 4.2  --   CL 101  --   CO2 25  --   GLUCOSE 101*  --   BUN 14  --   CREATININE 0.67 0.64  CALCIUM 8.4*  --     CBC: Recent Labs  Lab 06/19/22 0051  WBC 6.4  NEUTROABS 4.5  HGB 12.0  HCT 34.9*  MCV 100.9*  PLT 186    LFT No results for input(s): "AST", "ALT", "ALKPHOS", "BILITOT", "PROT", "ALBUMIN" in the last 168 hours.    Antibiotics: Anti-infectives (From admission, onward)    Start     Dose/Rate Route Frequency Ordered  Stop   06/20/22 1345  ceFAZolin (ANCEF) IVPB 2g/100 mL premix       Note to Pharmacy: Give in IR for prior to procedure. DO NOT GIVE ON THE FLOOR!  Give in IR for prior to procedure. DO NOT GIVE ON THE FLOOR!   2 g 200 mL/hr over 30 Minutes Intravenous  Once 06/20/22 1258 06/20/22 1428   06/18/22 1430  ceFAZolin (ANCEF) IVPB 2g/100 mL premix        2 g 200 mL/hr over 30 Minutes Intravenous To Radiology 06/18/22 1339 06/20/22 1217        DVT prophylaxis: Lovenox  Code Status: DNR  Family Communication: Discussed with patient's daughter at bedside   CONSULTS interventional radiology   Objective    Physical Examination:  Appears in no  acute distress S1-S2, regular Lungs clear to auscultation bilaterally Abdomen is soft, nontender, no organomegaly    Status is: Inpatient:         [name]   [facility] If 7PM-7AM, please contact night-coverage at [email or web address], Office  [phone]   06/24/2022, 9:19 AM  LOS: 8 days

## 2022-06-25 DIAGNOSIS — K219 Gastro-esophageal reflux disease without esophagitis: Secondary | ICD-10-CM | POA: Diagnosis not present

## 2022-06-25 DIAGNOSIS — D696 Thrombocytopenia, unspecified: Secondary | ICD-10-CM | POA: Diagnosis not present

## 2022-06-25 DIAGNOSIS — F411 Generalized anxiety disorder: Secondary | ICD-10-CM | POA: Diagnosis not present

## 2022-06-25 DIAGNOSIS — S32020A Wedge compression fracture of second lumbar vertebra, initial encounter for closed fracture: Secondary | ICD-10-CM | POA: Diagnosis not present

## 2022-06-25 DIAGNOSIS — E871 Hypo-osmolality and hyponatremia: Secondary | ICD-10-CM | POA: Diagnosis not present

## 2022-06-25 DIAGNOSIS — M3500 Sicca syndrome, unspecified: Secondary | ICD-10-CM | POA: Diagnosis not present

## 2022-06-25 DIAGNOSIS — Z789 Other specified health status: Secondary | ICD-10-CM | POA: Diagnosis not present

## 2022-06-25 DIAGNOSIS — E782 Mixed hyperlipidemia: Secondary | ICD-10-CM | POA: Diagnosis not present

## 2022-06-25 DIAGNOSIS — S32040D Wedge compression fracture of fourth lumbar vertebra, subsequent encounter for fracture with routine healing: Secondary | ICD-10-CM | POA: Diagnosis not present

## 2022-06-25 DIAGNOSIS — Z961 Presence of intraocular lens: Secondary | ICD-10-CM | POA: Diagnosis not present

## 2022-06-25 DIAGNOSIS — M8448XD Pathological fracture, other site, subsequent encounter for fracture with routine healing: Secondary | ICD-10-CM | POA: Diagnosis not present

## 2022-06-25 DIAGNOSIS — E559 Vitamin D deficiency, unspecified: Secondary | ICD-10-CM | POA: Diagnosis not present

## 2022-06-25 DIAGNOSIS — I10 Essential (primary) hypertension: Secondary | ICD-10-CM | POA: Diagnosis not present

## 2022-06-25 DIAGNOSIS — S32020D Wedge compression fracture of second lumbar vertebra, subsequent encounter for fracture with routine healing: Secondary | ICD-10-CM | POA: Diagnosis not present

## 2022-06-25 DIAGNOSIS — I129 Hypertensive chronic kidney disease with stage 1 through stage 4 chronic kidney disease, or unspecified chronic kidney disease: Secondary | ICD-10-CM | POA: Diagnosis not present

## 2022-06-25 DIAGNOSIS — N182 Chronic kidney disease, stage 2 (mild): Secondary | ICD-10-CM | POA: Diagnosis not present

## 2022-06-25 DIAGNOSIS — Z7409 Other reduced mobility: Secondary | ICD-10-CM | POA: Diagnosis not present

## 2022-06-25 DIAGNOSIS — M48062 Spinal stenosis, lumbar region with neurogenic claudication: Secondary | ICD-10-CM | POA: Diagnosis not present

## 2022-06-25 DIAGNOSIS — M8000XD Age-related osteoporosis with current pathological fracture, unspecified site, subsequent encounter for fracture with routine healing: Secondary | ICD-10-CM | POA: Diagnosis not present

## 2022-06-25 LAB — SURGICAL PATHOLOGY

## 2022-06-25 MED ORDER — LIDOCAINE 5 % EX PTCH: 1.0000 | MEDICATED_PATCH | CUTANEOUS | 0 refills | Status: DC

## 2022-06-25 MED ORDER — AMLODIPINE BESYLATE 5 MG PO TABS: 5.0000 mg | ORAL_TABLET | Freq: Every day | ORAL | Status: DC

## 2022-06-25 NOTE — Progress Notes (Signed)
Physical Therapy Treatment Patient Details Name: Lauren Davidson MRN: [id number] DOB: [birth year] Today's Date: 06/25/2022   History of Present Illness Pt is a 89 y/o female who presents from independent living facility with new onset low back pain. Pt denies recent fall or trauma. CT revealed L2 compression fracture. Pt underwent kyphoplasty 06/20/22. PMH: hyperlipidemia, hypertension, chronic back pain, atraumatic L4 compression fracture in May 2023.    PT Comments    Pt progressing well with post-op mobility. She does not report any back pain this session. Focus of session was ambulation to the bathroom to void and have BM, and then car transfer for transportation to SNF. Family present. Min assist provided for transition from w/c to vehicle without RW. Anticipate pt will progress well with functional mobility with continued skilled physical therapy services.    Recommendations for follow up therapy are one component of a multi-disciplinary discharge planning process, led by the attending physician.  Recommendations may be updated based on patient status, additional functional criteria and insurance authorization.  Follow Up Recommendations  Skilled nursing-short term rehab (<3 hours/day) Can patient physically be transported by private vehicle: Yes   Assistance Recommended at Discharge Frequent or constant Supervision/Assistance  Patient can return home with the following A little help with walking and/or transfers;A little help with bathing/dressing/bathroom;Assistance with cooking/housework;Assist for transportation;Help with stairs or ramp for entrance   Equipment Recommendations  Rolling walker (2 wheels)    Recommendations for Other Services       Precautions / Restrictions Precautions Precautions: Fall;Back Precaution Booklet Issued: Yes (comment) Precaution Comments: handout given to reinforce Spinal Brace Comments: TLSO d/c'd following kyphoplasty   - for comfort  only Restrictions Weight Bearing Restrictions: No     Mobility  Bed Mobility               General bed mobility comments: Pt was received sitting up in the recliner.    Transfers Overall transfer level: Needs assistance Equipment used: Rolling walker (2 wheels) Transfers: Sit to/from Stand Sit to Stand: Min guard           General transfer comment: Pt demonstrating good hand placement on seated surface for safety. Hands on guarding provided for safety.    Ambulation/Gait Ambulation/Gait assistance: Min guard Gait Distance (Feet): 25 Feet Assistive device: Rolling walker (2 wheels) Gait Pattern/deviations: Step-through pattern, Decreased stride length Gait velocity: decreased Gait velocity interpretation: 1.31 - 2.62 ft/sec, indicative of limited community ambulator   General Gait Details: Slow but generally steady with RW for support. In room only to/from bathroom as pt preparing for d/c.   Stairs             Wheelchair Mobility    Modified Rankin (Stroke Patients Only)       Balance Overall balance assessment: Needs assistance Sitting-balance support: Feet supported, No upper extremity supported Sitting balance-Leahy Scale: Fair Sitting balance - Comments: sitting in recliner Postural control: Posterior lean Standing balance support: Bilateral upper extremity supported, Reliant on assistive device for balance, During functional activity Standing balance-Leahy Scale: Poor Standing balance comment: with RW support                            Cognition Arousal/Alertness: Awake/alert Behavior During Therapy: WFL for tasks assessed/performed Overall Cognitive Status: Within Functional Limits for tasks assessed Area of Impairment: Memory  Memory: Decreased short-term memory         General Comments: Pt very HOH.        Exercises      General Comments        Pertinent Vitals/Pain Pain  Assessment Pain Assessment: Faces Faces Pain Scale: No hurt    Home Living                          Prior Function            PT Goals (current goals can now be found in the care plan section) Acute Rehab PT Goals Patient Stated Goal: Be able to go back to her independent living apartment. PT Goal Formulation: With patient/family Time For Goal Achievement: 06/27/22 Potential to Achieve Goals: Good Progress towards PT goals: Progressing toward goals    Frequency    Min 3X/week      PT Plan Current plan remains appropriate    Co-evaluation              AM-PAC PT "6 Clicks" Mobility   Outcome Measure  Help needed turning from your back to your side while in a flat bed without using bedrails?: A Little Help needed moving from lying on your back to sitting on the side of a flat bed without using bedrails?: A Little Help needed moving to and from a bed to a chair (including a wheelchair)?: A Little Help needed standing up from a chair using your arms (e.g., wheelchair or bedside chair)?: A Little Help needed to walk in hospital room?: A Little Help needed climbing 3-5 steps with a railing? : A Lot 6 Click Score: 17    End of Session Equipment Utilized During Treatment: Gait belt Activity Tolerance: Patient tolerated treatment well Patient left: in chair;with call bell/phone within reach;with chair alarm set;with family/visitor present Nurse Communication: Mobility status PT Visit Diagnosis: Unsteadiness on feet (R26.81);Pain;Difficulty in walking, not elsewhere classified (R26.2) Pain - Right/Left: Left Pain - part of body:  (back)     Time: 1430-1456 PT Time Calculation (min) (ACUTE ONLY): 26 min  Charges:  $Gait Training: 23-37 mins                     Rolinda Roan, PT, DPT Acute Rehabilitation Services Secure Chat Preferred Office: [phone]    Thelma Comp 06/25/2022, 3:13 PM

## 2022-06-25 NOTE — Discharge Summary (Signed)
Physician Discharge Summary   Patient: Lauren Davidson MRN: [id number] DOB: 04-15-1922  Admit date:     06/12/2022  Discharge date: 06/25/22  Discharge Physician: Meredeth Ide   PCP: Thana Ates, MD   Recommendations at discharge:   Follow-up IR as needed  Discharge Diagnoses: Principal Problem:   Lumbar compression fracture (HCC) Active Problems:   Closed compression fracture of L2 lumbar vertebra, initial encounter (HCC)  Resolved Problems:   * No resolved hospital problems. [facility] Course:  89 year old female with medical history of hyperlipidemia, hypertension, chronic back pain, atraumatic L4 compression fracture in May 2023 who initially presented to [facility] ED from independent living facility with complaint of new onset low back pain over the past few days. CT of lumbar spine revealed L2 compression fracture.  EDP discussed the case with neurosurgery on-call Dr. Maisie Fus who recommended TLSO when out of bed for comfort.  Patient was transferred to [facility] for symptom management.  IR was consulted to evaluate for possible kyphoplasty.  MRI of the lumbar spine showed acute compression fracture involving L2 vertebral body up to 20% height loss and trace 2 mm bony retropulsion.  Acute to subacute compression fracture involving L4.  Degenerative spondylosis at L3-4, L4-5. IR was consulted  Assessment and Plan:  L2/L4 vertebral compression fracture -MRI showed subacute compression fracture involving L4, acute compression fracture involving L2 -Patient was initially waiting for insurance approval -Insurance approval was obtained and patient underwent kyphoplasty at L2 - L4 compression fracture was not amenable for kyphoplasty -PT evaluation obtained, recommend skilled nursing facility for rehab -Pain well-controlled -[facility].     Hypertension -Blood pressure is stable -Started on  amlodipine 5 mg daily   Anxiety -Continue  Lexapro   Chronic back pain -Improved after kyphoplasty   Mild thrombocytopenia -Platelets improved   Hyponatremia -Improved -Likely from poor p.o. intake -Serum osmolality 283          Consultants: IR Procedures performed: Kyphoplasty at L2 Disposition: Home Diet recommendation:  Discharge Diet Orders (From admission, onward)     Start     Ordered   06/25/22 0000  Diet - low sodium heart healthy        06/25/22 1202           Regular diet DISCHARGE MEDICATION: Allergies as of 06/25/2022       Reactions   Statins Other (See Comments)   Muscle weakness and pain   Buprenorphine Hcl Other (See Comments)   Patient questioned this entry   Buspirone Other (See Comments)   Weakness   Lexapro [escitalopram] Other (See Comments)   "Muscle problems"   Lovastatin Other (See Comments)   Muscle pain   Meperidine Other (See Comments)   Demerol- questionable allergy- reaction not recalled   Morphine And Related Rash        Medication List     TAKE these medications    acetaminophen 500 MG tablet Commonly known as: TYLENOL Take 500 mg by mouth every 6 (six) hours as needed (for pain).   amLODipine 5 MG tablet Commonly known as: NORVASC Take 1 tablet (5 mg total) by mouth daily. Start taking on: June 26, 2022   CALCIUM+D3 PO Take 1 tablet by mouth daily with breakfast.   docusate sodium 100 MG capsule Commonly known as: COLACE Take 1 capsule (100 mg total) by mouth 2 (two) times daily.   escitalopram 10 MG tablet Commonly known as: LEXAPRO Take  10 mg by mouth daily.   fish oil-omega-3 fatty acids 1000 MG capsule Take 1,000 mg by mouth 2 (two) times daily after a meal.   lidocaine 5 % Commonly known as: Lidoderm Place 1 patch onto the skin daily. Remove & Discard patch within 12 hours or as directed by MD Start taking on: June 26, 2022   PreserVision AREDS 2 Caps Take 1 capsule by mouth in the morning and at bedtime.   multivitamin with  minerals tablet Take 1 tablet by mouth daily with lunch.   Systane Overnight Therapy 0.3 % Gel ophthalmic ointment Generic drug: hypromellose Place 1 application. into both eyes at bedtime.   Systane Ultra PF 0.4-0.3 % Soln Generic drug: Polyethyl Glyc-Propyl Glyc PF Place 1 drop into both eyes 4 (four) times daily.        Follow-up Information     de Glori Luis, MD Follow up.   Specialties: Radiology, Interventional Radiology Why: No immediate follow-up is needed at this time. Contact information: [address] [place] [address] [phone]                Discharge Exam: Ceasar Mons Weights   06/12/22 1357  Weight: 63.5 kg   General-appears in no acute distress Heart-S1-S2, regular, no murmur auscultated Lungs-clear to auscultation bilaterally, no wheezing or crackles auscultated Abdomen-soft, nontender, no organomegaly Extremities-no edema in the lower extremities Neuro-alert, oriented x3, no focal deficit noted  Condition at discharge: good  The results of significant diagnostics from this hospitalization (including imaging, microbiology, ancillary and laboratory) are listed below for reference.   Imaging Studies: IR KYPHO LUMBAR INC FX REDUCE BONE BX UNI/BIL CANNULATION INC/IMAGING  Result Date: 06/20/2022 INDICATION: 89 year old female with acute L2 vertebral body fracture and acute on chronic L4 vertebral body fracture and intractable back pain. She presents today for fluoroscopy guided kyphoplasty of the L2 vertebral body and possibly of the L4 vertebral body. EXAM: FLUOROSCOPY GUIDED L2 CORE BONE BIOPSY AND BALLOON KYPHOPLASTY COMPARISON:  CT lumbar spine June 12, 2022; MRI lumbar spine June 13, 2022. MEDICATIONS: As antibiotic prophylaxis, Ancef 2 g IV was ordered pre-procedure and administered intravenously within 1 hour of incision. All current medications are in the EMR and have been reviewed as part of this encounter.  ANESTHESIA/SEDATION: Moderate (conscious) sedation was employed during this procedure. A total of Versed 1.5 mg and Fentanyl 75 mcg were administered intravenously for moderate conscious sedation monitored under my direct supervision. Total intraservice time of sedation was 42 minutes. The patient's vital signs were monitored throughout the procedure and recorded in the patient's medical record by the radiology nurse. FLUOROSCOPY: Radiation Exposure Index (as provided by the fluoroscopic device): Peak skin dose (PSD) 1,529 mGy Kerma COMPLICATIONS: None immediate. PROCEDURE: Following a full explanation of the procedure along with the potential associated complications, an informed witnessed consent was obtained. The patient was placed in prone position on the angiography table. The lumbar spine region was prepped and draped in a sterile fashion. Under fluoroscopy, the L2 vertebral body was delineated and the skin area was marked. The skin was infiltrated with a 1% Lidocaine approximately 3.5 cm lateral to the spinous process projection on the right. Using a 22-gauge spinal needle, the soft issue and the peripedicular space and periosteum were infiltrated with Bupivacaine 0.5%. A skin incision was made at the access site. Subsequently, an 11-gauge Kyphon trocar was inserted under fluoroscopic guidance until contact with the pedicle was obtained. The trocar was inserted under light hammer tapping  into the pedicle until the posterior boundaries of the vertebral body was reached. The diamond mandrill was removed and one core biopsy wasobtained. The skin was infiltrated with a 1% Lidocaine approximately 3.5 cm lateral to the spinous process projection on the left. Using a 22-gauge spinal needle, the soft issue and the peripedicular space and periosteum were infiltrated with Bupivacaine 0.5%. A skin incision was made at the access site. Subsequently, an 11-gauge Kyphon trocar was inserted under fluoroscopic guidance until  contact with the pedicle was obtained. The trocar was inserted under light hammer tapping into the pedicle until the posterior boundaries of the vertebral body was reached. The diamond mandrill was removed and one core biopsy wasobtained. A bone drill was coaxially advanced within the anterior third of the vertebral body and then exchanged for inflatable Kyphon balloons. These were centered within the mid-aspect of the vertebral body. The balloons were inflated to create a void to serve as a repository for the bone cement. Both balloons were deflated and through both cannulas, under continuous fluoroscopy guidance in the AP and lateral views, the vertebral body was filled with previously mixed polymethyl-methacrylate (PMMA) added to barium for opacification. Both cannulas were later removed. The access sites were cleaned and covered with a sterile bandage. Fluoroscopic evaluation of L4 vertebral body showed vertebra plana without adequate access for vertebroplasty. Therefore, no intervention performed at the T4 level. IMPRESSION: 1. Successful fluoroscopy-guided bilateral transpedicular approach for L2 vertebral body core bone biopsy and kyphoplasty for treatment of osteoporotic fragility fracture. Bone samples obtained were sent for pathology analysis. 2. If the patient has known osteoporosis, recommend treatment as clinically indicated. If the patient's bone density status is unknown, DEXA scan is recommended. Electronically Signed   By: Pedro Earls M.D.   On: 06/20/2022 13:26   MR LUMBAR SPINE WO CONTRAST  Result Date: 06/13/2022 CLINICAL DATA:  Follow-up examination for compression fracture. EXAM: MRI LUMBAR SPINE WITHOUT CONTRAST TECHNIQUE: Multiplanar, multisequence MR imaging of the lumbar spine was performed. No intravenous contrast was administered. COMPARISON:  Prior CT from 06/12/2022. FINDINGS: Segmentation: Standard. Lowest well-formed disc space labeled the L5-S1 level. Alignment:  Mild levoscoliosis. Mild grade 1 degenerative stepwise anterolisthesis of L2 on L3 through L4 on L5. Vertebrae: Acute compression fracture involving the upper-mid L2 vertebral body is seen. Associated height loss measures up to 20% with trace 2 mm bony retropulsion. Compression deformity involving the L4 vertebral body demonstrates an acute to subacute component with associated marrow edema. Associated height loss measures up to 70% with 4 mm bony retropulsion. Otherwise, vertebral body height maintained. Reactive endplate changes at the inferior endplate of L3 and superior endplate of L5 noted, favored to be degenerative. Underlying bone marrow signal intensity within normal limits. No worrisome osseous lesions. Prominent reactive marrow edema noted about the right greater than left L3-4 and L4-5 facets due to facet arthritis. Conus medullaris and cauda equina: Conus extends to the L1 level. Conus and cauda equina appear normal. Paraspinal and other soft tissues: Paraspinous soft tissues demonstrate no acute finding. 2.2 cm cystic lesion within the left upper quadrant most likely reflects a small gastric diverticulum, also seen on prior CT. Colonic diverticulosis noted. Disc levels: T12-L1: Disc desiccation with mild disc bulge. Mild endplate spurring. Superimposed tiny central disc protrusion with annular fissure. No spinal stenosis. Mild bilateral foraminal narrowing. L1-2: Diffuse disc bulge with disc desiccation. Disc bulging eccentric to the left. Associated mild endplate spurring with trace 2 mm bony retropulsion related to the  L2 fracture. Mild facet hypertrophy. Resultant mild left lateral recess stenosis. Central canal remains patent. Foramina remain patent. L2-3: Diffuse disc bulge with disc desiccation. Superimposed broad-based left subarticular to extraforaminal disc protrusion (series 8, image 15). Mild to moderate facet hypertrophy. Resultant mild canal with left lateral recess stenosis. Mild right  with moderate left L2 foraminal narrowing. L3-4: Degenerative intervertebral disc space narrowing with diffuse disc bulge and disc desiccation. Reactive endplate change with up to 4 mm bony retropulsion related to the L4 fracture. Moderate facet and ligament flavum hypertrophy. Resultant severe spinal stenosis with the thecal sac measuring 4-5 mm in AP diameter at its most narrow point. Moderate bilateral L3 foraminal stenosis. L4-5: Disc bulge with disc desiccation and reactive endplate spurring. Moderate facet hypertrophy. Resultant mild canal with bilateral subarticular stenosis. Moderate right worse than left L4 foraminal narrowing. L5-S1: Disc desiccation without significant disc bulge. Mild endplate spurring. Mild facet hypertrophy. No spinal stenosis. Foramina remain patent IMPRESSION: 1. Acute compression fracture involving the L2 vertebral body with up to 20% height loss and trace 2 mm bony retropulsion. 2. Acute to subacute compression fracture involving the L4 vertebral body with up to 70% height loss and 4 mm bony retropulsion. 3. Underlying multilevel degenerative spondylosis and facet arthrosis, most pronounced at L3-4 where there is resultant severe spinal stenosis with moderate bilateral L3 foraminal narrowing. Additional moderate left L2 and bilateral L4 foraminal stenosis. 4. Prominent reactive marrow edema about the right greater than left L3-4 and L4-5 facets due to facet arthritis. Finding could also contribute to lower back pain. Electronically Signed   By: Rise MuBenjamin  McClintock M.D.   On: 06/13/2022 20:39   ECHOCARDIOGRAM COMPLETE  Result Date: 06/13/2022    ECHOCARDIOGRAM REPORT   Patient Name:   Johny Shearsancy T Simic Date of Exam: 06/13/2022 Medical Rec #:  [id number]      Height:       64.0 in Accession #:    [id number]     Weight:       140.0 lb Date of Birth:  [birth year]      BSA:          1.681 m Patient Age:    89 years       BP:           144/61 mmHg Patient Gender: F              HR:            79 bpm. Exam Location:  Inpatient Procedure: 2D Echo, 3D Echo, Color Doppler and Cardiac Doppler Indications:    I50.40* Unspecified combined systolic (congestive) and diastolic                 (congestive) heart failure  History:        Patient has no prior history of Echocardiogram examinations.                 Mitral Valve Prolapse and Mitral Valve Disease; Risk                 Factors:Hypertension and Dyslipidemia.  Sonographer:    Sheralyn Boatmanina West RDCS Referring Phys: Darlin DropAROLE N HALL  Sonographer Comments: Technically difficult study due to poor echo windows. IMPRESSIONS  1. Left ventricular ejection fraction, by estimation, is 60 to 65%. The left ventricle has normal function. The left ventricle has no regional wall motion abnormalities. There is moderate asymmetric left ventricular hypertrophy of the basal-septal segment. Left ventricular diastolic parameters are consistent with Grade  I diastolic dysfunction (impaired relaxation). There is the interventricular septum is flattened in systole and diastole, consistent with right ventricular pressure and volume overload.  2. Right ventricular systolic function is normal. The right ventricular size is severely enlarged. There is moderately elevated pulmonary artery systolic pressure. The estimated right ventricular systolic pressure is 55.1 mmHg.  3. HR 79 bpm. The mitral valve is degenerative. Mild to moderate mitral valve regurgitation. Mild mitral stenosis. The mean mitral valve gradient is 5.0 mmHg. Severe mitral annular calcification.  4. Tricuspid valve regurgitation is moderate.  5. The aortic valve was not well visualized. Aortic valve regurgitation is not visualized.  6. The inferior vena cava is normal in size with <50% respiratory variability, suggesting right atrial pressure of 8 mmHg. FINDINGS  Left Ventricle: Left ventricular ejection fraction, by estimation, is 60 to 65%. The left ventricle has normal function. The left ventricle has no regional wall  motion abnormalities. The left ventricular internal cavity size was normal in size. There is  moderate asymmetric left ventricular hypertrophy of the basal-septal segment. The interventricular septum is flattened in systole and diastole, consistent with right ventricular pressure and volume overload. Left ventricular diastolic parameters are consistent with Grade I diastolic dysfunction (impaired relaxation). Right Ventricle: The right ventricular size is severely enlarged. Right ventricular systolic function is normal. There is moderately elevated pulmonary artery systolic pressure. The tricuspid regurgitant velocity is 3.43 m/s, and with an assumed right atrial pressure of 8 mmHg, the estimated right ventricular systolic pressure is 55.1 mmHg. Left Atrium: Left atrial size was normal in size. Right Atrium: Right atrial size was normal in size. Pericardium: There is no evidence of pericardial effusion. Mitral Valve: HR 79 bpm. The mitral valve is degenerative in appearance. Severe mitral annular calcification. Mild to moderate mitral valve regurgitation. Mild mitral valve stenosis. MV peak gradient, 10.5 mmHg. The mean mitral valve gradient is 5.0 mmHg. Tricuspid Valve: Tricuspid valve regurgitation is moderate. Aortic Valve: The aortic valve was not well visualized. Aortic valve regurgitation is not visualized. Aortic valve mean gradient measures 6.5 mmHg. Aortic valve peak gradient measures 12.0 mmHg. Aortic valve area, by VTI measures 2.90 cm. Pulmonic Valve: Pulmonic valve regurgitation is not visualized. Aorta: The aortic root and ascending aorta are structurally normal, with no evidence of dilitation. Venous: The inferior vena cava is normal in size with less than 50% respiratory variability, suggesting right atrial pressure of 8 mmHg. IAS/Shunts: No atrial level shunt detected by color flow Doppler.  LEFT VENTRICLE PLAX 2D LVIDd:         3.70 cm   Diastology LVIDs:         2.40 cm   LV e' medial:    3.48  cm/s LV PW:         1.10 cm   LV E/e' medial:  35.6 LV IVS:        1.50 cm   LV e' lateral:   4.90 cm/s LVOT diam:     2.00 cm   LV E/e' lateral: 25.3 LV SV:         95 LV SV Index:   56 LVOT Area:     3.14 cm                           3D Volume EF:                          3D EF:  68 %                          LV EDV:       90 ml                          LV ESV:       29 ml                          LV SV:        61 ml RIGHT VENTRICLE             IVC RV S prime:     14.50 cm/s  IVC diam: 2.10 cm TAPSE (M-mode): 2.6 cm LEFT ATRIUM           Index        RIGHT ATRIUM           Index LA Vol (A2C): 33.5 ml 19.93 ml/m  RA Area:     15.10 cm LA Vol (A4C): 46.6 ml 27.72 ml/m  RA Volume:   38.10 ml  22.66 ml/m  AORTIC VALVE AV Area (Vmax):    2.76 cm AV Area (Vmean):   2.60 cm AV Area (VTI):     2.90 cm AV Vmax:           173.00 cm/s AV Vmean:          117.000 cm/s AV VTI:            0.327 m AV Peak Grad:      12.0 mmHg AV Mean Grad:      6.5 mmHg LVOT Vmax:         152.00 cm/s LVOT Vmean:        97.000 cm/s LVOT VTI:          0.302 m LVOT/AV VTI ratio: 0.92  AORTA Ao Root diam: 2.80 cm Ao Asc diam:  2.70 cm MITRAL VALVE                  TRICUSPID VALVE MV Area (PHT): 2.42 cm       TR Peak grad:   47.1 mmHg MV Area VTI:   1.67 cm       TR Vmax:        343.00 cm/s MV Peak grad:  10.5 mmHg MV Mean grad:  5.0 mmHg       SHUNTS MV Vmax:       1.62 m/s       Systemic VTI:  0.30 m MV Vmean:      103.0 cm/s     Systemic Diam: 2.00 cm MV Decel Time: 313 msec MR Peak grad:    60.8 mmHg MR Mean grad:    37.0 mmHg MR Vmax:         390.00 cm/s MR Vmean:        286.0 cm/s MR PISA:         4.02 cm MR PISA Eff ROA: 41 mm MR PISA Radius:  0.80 cm MV E velocity: 124.00 cm/s MV A velocity: 152.00 cm/s MV E/A ratio:  0.82 Mary Scientist, physiological signed by Phineas Inches Signature Date/Time: 06/13/2022/4:22:23 PM    Final    CT Lumbar Spine Wo Contrast  Result Date: 06/12/2022 CLINICAL DATA:  Trauma EXAM: CT LUMBAR SPINE  WITHOUT CONTRAST TECHNIQUE: Multidetector CT imaging of the lumbar spine was performed  without intravenous contrast administration. Multiplanar CT image reconstructions were also generated. RADIATION DOSE REDUCTION: This exam was performed according to the departmental dose-optimization program which includes automated exposure control, adjustment of the mA and/or kV according to patient size and/or use of iterative reconstruction technique. COMPARISON:  Same day lumbar spine radiograph, lumbar spine CT 09/17/2021 FINDINGS: Segmentation: 5 lumbar type vertebrae. Alignment: Normal. Vertebrae: Redemonstrated is a severe chronic deformity at L4 with progressive height loss along the anterior aspect of the vertebral body compared to 09/17/2021. compared to prior exam there is a new superior endplate compression deformity at the L2 vertebral body level without significant height loss. Paraspinal and other soft tissues: Aortic atherosclerotic calcification. Disc levels: There is no significant change from prior exam with persistent moderate spinal canal stenosis at L1-L2, severe spinal canal stenosis at L2-L5. there is also severe bilateral neural foraminal stenosis at L3-L4 and L4-L5. IMPRESSION: 1. New superior endplate compression deformity at the L2 vertebral body level without significant height loss. 2. Severe chronic deformity at L4 with progressive height loss along the anterior aspect of the vertebral body compared to 09/17/2021. 3. Unchanged severe spinal canal stenosis at L2-L5 and severe bilateral neural foraminal stenosis at L3-L4 and L4-L5. Aortic Atherosclerosis (ICD10-I70.0). Electronically Signed   By: Lorenza CambridgeHemant  Desai M.D.   On: 06/12/2022 17:41   DG Lumbar Spine Complete  Result Date: 06/12/2022 CLINICAL DATA:  Lower back pain EXAM: LUMBAR SPINE - COMPLETE 4+ VIEW COMPARISON:  12/05/2021 FINDINGS: Osseous demineralization. Five non-rib-bearing lumbar vertebra. Marked compression deformity of L4  vertebral body similar to previous study. Mild superior endplate compression deformity of L2 with minimal height loss, new. No additional fracture, subluxation, or bone destruction. Multilevel facet degenerative changes. Minimal levoconvex lumbar scoliosis. SI joints preserved. Atherosclerotic calcifications aorta. IMPRESSION: Chronic L4 compression fracture with minimal superior endplate compression deformity of L2 vertebral body new since 12/05/2021. Aortic Atherosclerosis (ICD10-I70.0). Electronically Signed   By: Ulyses SouthwardMark  Boles M.D.   On: 06/12/2022 16:13   DG Chest 2 View  Result Date: 06/12/2022 CLINICAL DATA:  Hypoxia, new oxygen requirement EXAM: CHEST - 2 VIEW COMPARISON:  11/03/2018 FINDINGS: Enlargement of cardiac silhouette. Mediastinal contours and pulmonary vascularity normal. Atherosclerotic calcification aorta. Emphysematous changes with chronic accentuation of interstitial markings unchanged. Small bibasilar pleural effusions. No acute infiltrate or pneumothorax. Bones demineralized with slightly increased compression fracture of a midthoracic vertebra. IMPRESSION: COPD changes with small bibasilar pleural effusions increased versus previous study. Chronic accentuation of interstitial markings without acute infiltrate. Slightly increased compression fracture of a midthoracic vertebra. Aortic Atherosclerosis (ICD10-I70.0) and Emphysema (ICD10-J43.9). Electronically Signed   By: Ulyses SouthwardMark  Boles M.D.   On: 06/12/2022 16:11    Microbiology: Results for orders placed or performed during the hospital encounter of 06/12/22  Resp panel by RT-PCR (RSV, Flu A&B, Covid) Anterior Nasal Swab     Status: None   Collection Time: 06/12/22  3:35 PM   Specimen: Anterior Nasal Swab  Result Value Ref Range Status   SARS Coronavirus 2 by RT PCR NEGATIVE NEGATIVE Final    Comment: (NOTE) SARS-CoV-2 target nucleic acids are NOT DETECTED.  The SARS-CoV-2 RNA is generally detectable in upper respiratory specimens  during the acute phase of infection. The lowest concentration of SARS-CoV-2 viral copies this assay can detect is 138 copies/mL. A negative result does not preclude SARS-Cov-2 infection and should not be used as the sole basis for treatment or other patient management decisions. A negative result may occur with  improper specimen collection/handling, submission  of specimen other than nasopharyngeal swab, presence of viral mutation(s) within the areas targeted by this assay, and inadequate number of viral copies(<138 copies/mL). A negative result must be combined with clinical observations, patient history, and epidemiological information. The expected result is Negative.  Fact Sheet for Patients:  EntrepreneurPulse.com.au  Fact Sheet for Healthcare Providers:  IncredibleEmployment.be  This test is no t yet approved or cleared by the [place] FDA and  has been authorized for detection and/or diagnosis of SARS-CoV-2 by FDA under an Emergency Use Authorization (EUA). This EUA will remain  in effect (meaning this test can be used) for the duration of the COVID-19 declaration under Section 564(b)(1) of the Act, 21 U.S.C.section 360bbb-3(b)(1), unless the authorization is terminated  or revoked sooner.       Influenza A by PCR NEGATIVE NEGATIVE Final   Influenza B by PCR NEGATIVE NEGATIVE Final    Comment: (NOTE) The Xpert Xpress SARS-CoV-2/FLU/RSV plus assay is intended as an aid in the diagnosis of influenza from Nasopharyngeal swab specimens and should not be used as a sole basis for treatment. Nasal washings and aspirates are unacceptable for Xpert Xpress SARS-CoV-2/FLU/RSV testing.  Fact Sheet for Patients: EntrepreneurPulse.com.au  Fact Sheet for Healthcare Providers: IncredibleEmployment.be  This test is not yet approved or cleared by the [place] FDA and has been authorized for detection  and/or diagnosis of SARS-CoV-2 by FDA under an Emergency Use Authorization (EUA). This EUA will remain in effect (meaning this test can be used) for the duration of the COVID-19 declaration under Section 564(b)(1) of the Act, 21 U.S.C. section 360bbb-3(b)(1), unless the authorization is terminated or revoked.     Resp Syncytial Virus by PCR NEGATIVE NEGATIVE Final    Comment: (NOTE) Fact Sheet for Patients: EntrepreneurPulse.com.au  Fact Sheet for Healthcare Providers: IncredibleEmployment.be  This test is not yet approved or cleared by the [place] FDA and has been authorized for detection and/or diagnosis of SARS-CoV-2 by FDA under an Emergency Use Authorization (EUA). This EUA will remain in effect (meaning this test can be used) for the duration of the COVID-19 declaration under Section 564(b)(1) of the Act, 21 U.S.C. section 360bbb-3(b)(1), unless the authorization is terminated or revoked.  Performed at [facility], [address], [place], [place] [address]     Labs: CBC: Recent Labs  Lab 06/19/22 0051  WBC 6.4  NEUTROABS 4.5  HGB 12.0  HCT 34.9*  MCV 100.9*  PLT 093   Basic Metabolic Panel: Recent Labs  Lab 06/19/22 0051 06/20/22 0145  NA 133*  --   K 4.2  --   CL 101  --   CO2 25  --   GLUCOSE 101*  --   BUN 14  --   CREATININE 0.67 0.64  CALCIUM 8.4*  --    Liver Function Tests: No results for input(s): "AST", "ALT", "ALKPHOS", "BILITOT", "PROT", "ALBUMIN" in the last 168 hours. CBG: No results for input(s): "GLUCAP" in the last 168 hours.  Discharge time spent: greater than 30 minutes.  Signed: [name], MD [facility] 06/25/2022

## 2022-06-25 NOTE — Progress Notes (Signed)
Mobility Specialist Progress Note    06/25/22 0959  Mobility  Activity Ambulated with assistance in hallway  Level of Assistance Minimal assist, patient does 75% or more  Assistive Device Front wheel walker  Distance Ambulated (ft) 130 ft  Activity Response Tolerated well  Mobility Referral Yes  $Mobility charge 1 Mobility   Pt received in chair and agreeable. No complaints. Returned to chair with call bell in reach and visitor present.   Hildred Alamin Mobility Specialist  Please Psychologist, sport and exercise or Rehab Office at [phone]

## 2022-06-25 NOTE — TOC Progression Note (Addendum)
Transition of Care [facility]) - Progression Note    Patient Details  Name: WAUNITA SANDSTROM MRN: [id number] Date of Birth: [birth year]  Transition of Care [facility]) CM/SW Contact  Joanne Chars, LCSW Phone Number: 06/25/2022, 10:18 AM  Clinical Narrative:   Per Angela/TOC CMA, Holli Humbles still pending.    1200: Auth approved, information received from [employer].  CSW confirmed with Whitney/[facility].  She spoke with Levada Dy to get auth details.  Whitney confirmed to CSW they can accept pt today.  CSW informed pt and relative Gay Filler.  Discussed the family providing transportation with Gay Filler and with son Gershon Mussel, who confirmed that Edd Arbour will be to the hospital shortly to provide transportation.     Expected Discharge Plan: [place] Barriers to Discharge: Continued Medical Work up  Expected Discharge Plan and Services   Discharge Planning Services: CM Consult   Living arrangements for the past 2 months: [place]: PT [place] Agency: Other - See comment ([facility] in house therapy @ [place])         Social Determinants of Health (SDOH) Interventions SDOH Screenings   Food Insecurity: No Food Insecurity (06/13/2022)  Housing: Low Risk  (06/13/2022)  Transportation Needs: No Transportation Needs (06/13/2022)  Utilities: Not At Risk (06/13/2022)  Tobacco Use: Low Risk  (06/20/2022)    Readmission Risk Interventions     No data to display

## 2022-06-25 NOTE — TOC Transition Note (Signed)
Transition of Care [facility]) - CM/SW Discharge Note   Patient Details  Name: Lauren Davidson MRN: [id number] Date of Birth: April 19, 1922  Transition of [facility]) CM/SW Contact:  Joanne Chars, LCSW Phone Number: 06/25/2022, 12:12 PM   Clinical Narrative:   Pt discharging to [place], room 103.  RN call report to [phone]. [phone] main number)  Family member will provide transportation and may need help with bringing pt to main entrance and getting her into the vehicle.      Final next level of care: Skilled Nursing Facility Barriers to Discharge: Barriers Resolved   Patient Goals and CMS Choice   Choice offered to / list presented to : Patient  Discharge Placement                Patient chooses bed at:  [facility]) Patient to be transferred to facility by: family Name of family member notified: son Gershon Mussel, relative Gay Filler in room Patient and family notified of of transfer: 06/25/22  Discharge Plan and Services Additional resources added to the After Visit Summary for     Discharge Planning Services: CM Consult                      HH Arranged: PT [place] Agency: Other - See comment ([facility]/ in house therapy @ [place])        Social Determinants of Health ([place]) Interventions SDOH Screenings   Food Insecurity: No Food Insecurity (06/13/2022)  Housing: Low Risk  (06/13/2022)  Transportation Needs: No Transportation Needs (06/13/2022)  Utilities: Not At Risk (06/13/2022)  Tobacco Use: Low Risk  (06/20/2022)     Readmission Risk Interventions     No data to display

## 2022-06-26 DIAGNOSIS — Z7409 Other reduced mobility: Secondary | ICD-10-CM | POA: Diagnosis not present

## 2022-06-26 DIAGNOSIS — M8000XD Age-related osteoporosis with current pathological fracture, unspecified site, subsequent encounter for fracture with routine healing: Secondary | ICD-10-CM | POA: Diagnosis not present

## 2022-06-26 DIAGNOSIS — S32020D Wedge compression fracture of second lumbar vertebra, subsequent encounter for fracture with routine healing: Secondary | ICD-10-CM | POA: Diagnosis not present

## 2022-06-26 DIAGNOSIS — S32040D Wedge compression fracture of fourth lumbar vertebra, subsequent encounter for fracture with routine healing: Secondary | ICD-10-CM | POA: Diagnosis not present

## 2022-06-26 DIAGNOSIS — I10 Essential (primary) hypertension: Secondary | ICD-10-CM | POA: Diagnosis not present

## 2022-06-26 DIAGNOSIS — E782 Mixed hyperlipidemia: Secondary | ICD-10-CM | POA: Diagnosis not present

## 2022-06-26 DIAGNOSIS — Z789 Other specified health status: Secondary | ICD-10-CM | POA: Diagnosis not present

## 2022-07-09 DIAGNOSIS — M62552 Muscle wasting and atrophy, not elsewhere classified, left thigh: Secondary | ICD-10-CM | POA: Diagnosis not present

## 2022-07-09 DIAGNOSIS — M62541 Muscle wasting and atrophy, not elsewhere classified, right hand: Secondary | ICD-10-CM | POA: Diagnosis not present

## 2022-07-09 DIAGNOSIS — R2681 Unsteadiness on feet: Secondary | ICD-10-CM | POA: Diagnosis not present

## 2022-07-09 DIAGNOSIS — M62562 Muscle wasting and atrophy, not elsewhere classified, left lower leg: Secondary | ICD-10-CM | POA: Diagnosis not present

## 2022-07-09 DIAGNOSIS — M62542 Muscle wasting and atrophy, not elsewhere classified, left hand: Secondary | ICD-10-CM | POA: Diagnosis not present

## 2022-07-09 DIAGNOSIS — R2689 Other abnormalities of gait and mobility: Secondary | ICD-10-CM | POA: Diagnosis not present

## 2022-07-09 DIAGNOSIS — M62511 Muscle wasting and atrophy, not elsewhere classified, right shoulder: Secondary | ICD-10-CM | POA: Diagnosis not present

## 2022-07-09 DIAGNOSIS — M62561 Muscle wasting and atrophy, not elsewhere classified, right lower leg: Secondary | ICD-10-CM | POA: Diagnosis not present

## 2022-07-09 DIAGNOSIS — M62551 Muscle wasting and atrophy, not elsewhere classified, right thigh: Secondary | ICD-10-CM | POA: Diagnosis not present

## 2022-07-10 DIAGNOSIS — R2681 Unsteadiness on feet: Secondary | ICD-10-CM | POA: Diagnosis not present

## 2022-07-10 DIAGNOSIS — M62542 Muscle wasting and atrophy, not elsewhere classified, left hand: Secondary | ICD-10-CM | POA: Diagnosis not present

## 2022-07-10 DIAGNOSIS — M62511 Muscle wasting and atrophy, not elsewhere classified, right shoulder: Secondary | ICD-10-CM | POA: Diagnosis not present

## 2022-07-10 DIAGNOSIS — M62551 Muscle wasting and atrophy, not elsewhere classified, right thigh: Secondary | ICD-10-CM | POA: Diagnosis not present

## 2022-07-10 DIAGNOSIS — R2689 Other abnormalities of gait and mobility: Secondary | ICD-10-CM | POA: Diagnosis not present

## 2022-07-10 DIAGNOSIS — M62561 Muscle wasting and atrophy, not elsewhere classified, right lower leg: Secondary | ICD-10-CM | POA: Diagnosis not present

## 2022-07-10 DIAGNOSIS — M62552 Muscle wasting and atrophy, not elsewhere classified, left thigh: Secondary | ICD-10-CM | POA: Diagnosis not present

## 2022-07-10 DIAGNOSIS — M62562 Muscle wasting and atrophy, not elsewhere classified, left lower leg: Secondary | ICD-10-CM | POA: Diagnosis not present

## 2022-07-10 DIAGNOSIS — M62541 Muscle wasting and atrophy, not elsewhere classified, right hand: Secondary | ICD-10-CM | POA: Diagnosis not present

## 2022-07-11 DIAGNOSIS — M62542 Muscle wasting and atrophy, not elsewhere classified, left hand: Secondary | ICD-10-CM | POA: Diagnosis not present

## 2022-07-11 DIAGNOSIS — M62541 Muscle wasting and atrophy, not elsewhere classified, right hand: Secondary | ICD-10-CM | POA: Diagnosis not present

## 2022-07-11 DIAGNOSIS — M62561 Muscle wasting and atrophy, not elsewhere classified, right lower leg: Secondary | ICD-10-CM | POA: Diagnosis not present

## 2022-07-11 DIAGNOSIS — R2681 Unsteadiness on feet: Secondary | ICD-10-CM | POA: Diagnosis not present

## 2022-07-11 DIAGNOSIS — M62511 Muscle wasting and atrophy, not elsewhere classified, right shoulder: Secondary | ICD-10-CM | POA: Diagnosis not present

## 2022-07-11 DIAGNOSIS — M62552 Muscle wasting and atrophy, not elsewhere classified, left thigh: Secondary | ICD-10-CM | POA: Diagnosis not present

## 2022-07-11 DIAGNOSIS — M62551 Muscle wasting and atrophy, not elsewhere classified, right thigh: Secondary | ICD-10-CM | POA: Diagnosis not present

## 2022-07-11 DIAGNOSIS — R2689 Other abnormalities of gait and mobility: Secondary | ICD-10-CM | POA: Diagnosis not present

## 2022-07-11 DIAGNOSIS — M62562 Muscle wasting and atrophy, not elsewhere classified, left lower leg: Secondary | ICD-10-CM | POA: Diagnosis not present

## 2022-07-12 DIAGNOSIS — R2681 Unsteadiness on feet: Secondary | ICD-10-CM | POA: Diagnosis not present

## 2022-07-12 DIAGNOSIS — M62561 Muscle wasting and atrophy, not elsewhere classified, right lower leg: Secondary | ICD-10-CM | POA: Diagnosis not present

## 2022-07-12 DIAGNOSIS — M62541 Muscle wasting and atrophy, not elsewhere classified, right hand: Secondary | ICD-10-CM | POA: Diagnosis not present

## 2022-07-12 DIAGNOSIS — M62562 Muscle wasting and atrophy, not elsewhere classified, left lower leg: Secondary | ICD-10-CM | POA: Diagnosis not present

## 2022-07-12 DIAGNOSIS — M62511 Muscle wasting and atrophy, not elsewhere classified, right shoulder: Secondary | ICD-10-CM | POA: Diagnosis not present

## 2022-07-12 DIAGNOSIS — R2689 Other abnormalities of gait and mobility: Secondary | ICD-10-CM | POA: Diagnosis not present

## 2022-07-12 DIAGNOSIS — M62551 Muscle wasting and atrophy, not elsewhere classified, right thigh: Secondary | ICD-10-CM | POA: Diagnosis not present

## 2022-07-12 DIAGNOSIS — M62552 Muscle wasting and atrophy, not elsewhere classified, left thigh: Secondary | ICD-10-CM | POA: Diagnosis not present

## 2022-07-12 DIAGNOSIS — M62542 Muscle wasting and atrophy, not elsewhere classified, left hand: Secondary | ICD-10-CM | POA: Diagnosis not present

## 2022-07-15 DIAGNOSIS — M62551 Muscle wasting and atrophy, not elsewhere classified, right thigh: Secondary | ICD-10-CM | POA: Diagnosis not present

## 2022-07-15 DIAGNOSIS — M62552 Muscle wasting and atrophy, not elsewhere classified, left thigh: Secondary | ICD-10-CM | POA: Diagnosis not present

## 2022-07-15 DIAGNOSIS — M62542 Muscle wasting and atrophy, not elsewhere classified, left hand: Secondary | ICD-10-CM | POA: Diagnosis not present

## 2022-07-15 DIAGNOSIS — M62541 Muscle wasting and atrophy, not elsewhere classified, right hand: Secondary | ICD-10-CM | POA: Diagnosis not present

## 2022-07-15 DIAGNOSIS — M62562 Muscle wasting and atrophy, not elsewhere classified, left lower leg: Secondary | ICD-10-CM | POA: Diagnosis not present

## 2022-07-15 DIAGNOSIS — R2689 Other abnormalities of gait and mobility: Secondary | ICD-10-CM | POA: Diagnosis not present

## 2022-07-15 DIAGNOSIS — M62511 Muscle wasting and atrophy, not elsewhere classified, right shoulder: Secondary | ICD-10-CM | POA: Diagnosis not present

## 2022-07-15 DIAGNOSIS — M62561 Muscle wasting and atrophy, not elsewhere classified, right lower leg: Secondary | ICD-10-CM | POA: Diagnosis not present

## 2022-07-15 DIAGNOSIS — R2681 Unsteadiness on feet: Secondary | ICD-10-CM | POA: Diagnosis not present

## 2022-07-16 DIAGNOSIS — R2689 Other abnormalities of gait and mobility: Secondary | ICD-10-CM | POA: Diagnosis not present

## 2022-07-16 DIAGNOSIS — M62561 Muscle wasting and atrophy, not elsewhere classified, right lower leg: Secondary | ICD-10-CM | POA: Diagnosis not present

## 2022-07-16 DIAGNOSIS — M62511 Muscle wasting and atrophy, not elsewhere classified, right shoulder: Secondary | ICD-10-CM | POA: Diagnosis not present

## 2022-07-16 DIAGNOSIS — M62552 Muscle wasting and atrophy, not elsewhere classified, left thigh: Secondary | ICD-10-CM | POA: Diagnosis not present

## 2022-07-16 DIAGNOSIS — M62562 Muscle wasting and atrophy, not elsewhere classified, left lower leg: Secondary | ICD-10-CM | POA: Diagnosis not present

## 2022-07-16 DIAGNOSIS — R2681 Unsteadiness on feet: Secondary | ICD-10-CM | POA: Diagnosis not present

## 2022-07-16 DIAGNOSIS — M62541 Muscle wasting and atrophy, not elsewhere classified, right hand: Secondary | ICD-10-CM | POA: Diagnosis not present

## 2022-07-16 DIAGNOSIS — M62551 Muscle wasting and atrophy, not elsewhere classified, right thigh: Secondary | ICD-10-CM | POA: Diagnosis not present

## 2022-07-16 DIAGNOSIS — M62542 Muscle wasting and atrophy, not elsewhere classified, left hand: Secondary | ICD-10-CM | POA: Diagnosis not present

## 2022-07-18 ENCOUNTER — Other Ambulatory Visit: Payer: Self-pay | Admitting: Internal Medicine

## 2022-07-18 ENCOUNTER — Ambulatory Visit
Admission: RE | Admit: 2022-07-18 | Discharge: 2022-07-18 | Disposition: A | Discharge status: Home / Self Care | Payer: Medicare HMO | Source: Ambulatory Visit | Attending: Internal Medicine | Admitting: Internal Medicine

## 2022-07-18 DIAGNOSIS — Z9989 Dependence on other enabling machines and devices: Secondary | ICD-10-CM | POA: Diagnosis not present

## 2022-07-18 DIAGNOSIS — S32040A Wedge compression fracture of fourth lumbar vertebra, initial encounter for closed fracture: Secondary | ICD-10-CM | POA: Diagnosis not present

## 2022-07-18 DIAGNOSIS — M8588 Other specified disorders of bone density and structure, other site: Secondary | ICD-10-CM | POA: Diagnosis not present

## 2022-07-18 DIAGNOSIS — G8929 Other chronic pain: Secondary | ICD-10-CM | POA: Diagnosis not present

## 2022-07-18 DIAGNOSIS — M545 Low back pain, unspecified: Secondary | ICD-10-CM | POA: Diagnosis not present

## 2022-07-18 DIAGNOSIS — M25552 Pain in left hip: Secondary | ICD-10-CM | POA: Diagnosis not present

## 2022-07-18 DIAGNOSIS — R030 Elevated blood-pressure reading, without diagnosis of hypertension: Secondary | ICD-10-CM | POA: Diagnosis not present

## 2022-07-18 DIAGNOSIS — M62561 Muscle wasting and atrophy, not elsewhere classified, right lower leg: Secondary | ICD-10-CM | POA: Diagnosis not present

## 2022-07-18 DIAGNOSIS — M62552 Muscle wasting and atrophy, not elsewhere classified, left thigh: Secondary | ICD-10-CM | POA: Diagnosis not present

## 2022-07-18 DIAGNOSIS — M25551 Pain in right hip: Secondary | ICD-10-CM | POA: Diagnosis not present

## 2022-07-18 DIAGNOSIS — M3500 Sicca syndrome, unspecified: Secondary | ICD-10-CM | POA: Diagnosis not present

## 2022-07-18 DIAGNOSIS — R2681 Unsteadiness on feet: Secondary | ICD-10-CM | POA: Diagnosis not present

## 2022-07-18 DIAGNOSIS — F419 Anxiety disorder, unspecified: Secondary | ICD-10-CM | POA: Diagnosis not present

## 2022-07-18 DIAGNOSIS — M62551 Muscle wasting and atrophy, not elsewhere classified, right thigh: Secondary | ICD-10-CM | POA: Diagnosis not present

## 2022-07-18 DIAGNOSIS — M62542 Muscle wasting and atrophy, not elsewhere classified, left hand: Secondary | ICD-10-CM | POA: Diagnosis not present

## 2022-07-18 DIAGNOSIS — M62562 Muscle wasting and atrophy, not elsewhere classified, left lower leg: Secondary | ICD-10-CM | POA: Diagnosis not present

## 2022-07-18 DIAGNOSIS — M62511 Muscle wasting and atrophy, not elsewhere classified, right shoulder: Secondary | ICD-10-CM | POA: Diagnosis not present

## 2022-07-18 DIAGNOSIS — M62541 Muscle wasting and atrophy, not elsewhere classified, right hand: Secondary | ICD-10-CM | POA: Diagnosis not present

## 2022-07-18 DIAGNOSIS — N1831 Chronic kidney disease, stage 3a: Secondary | ICD-10-CM | POA: Diagnosis not present

## 2022-07-18 DIAGNOSIS — R2689 Other abnormalities of gait and mobility: Secondary | ICD-10-CM | POA: Diagnosis not present

## 2022-07-19 DIAGNOSIS — M62561 Muscle wasting and atrophy, not elsewhere classified, right lower leg: Secondary | ICD-10-CM | POA: Diagnosis not present

## 2022-07-19 DIAGNOSIS — R2681 Unsteadiness on feet: Secondary | ICD-10-CM | POA: Diagnosis not present

## 2022-07-19 DIAGNOSIS — R2689 Other abnormalities of gait and mobility: Secondary | ICD-10-CM | POA: Diagnosis not present

## 2022-07-19 DIAGNOSIS — M62552 Muscle wasting and atrophy, not elsewhere classified, left thigh: Secondary | ICD-10-CM | POA: Diagnosis not present

## 2022-07-19 DIAGNOSIS — M62551 Muscle wasting and atrophy, not elsewhere classified, right thigh: Secondary | ICD-10-CM | POA: Diagnosis not present

## 2022-07-19 DIAGNOSIS — M62542 Muscle wasting and atrophy, not elsewhere classified, left hand: Secondary | ICD-10-CM | POA: Diagnosis not present

## 2022-07-19 DIAGNOSIS — M62562 Muscle wasting and atrophy, not elsewhere classified, left lower leg: Secondary | ICD-10-CM | POA: Diagnosis not present

## 2022-07-19 DIAGNOSIS — M62541 Muscle wasting and atrophy, not elsewhere classified, right hand: Secondary | ICD-10-CM | POA: Diagnosis not present

## 2022-07-19 DIAGNOSIS — M62511 Muscle wasting and atrophy, not elsewhere classified, right shoulder: Secondary | ICD-10-CM | POA: Diagnosis not present

## 2022-07-22 DIAGNOSIS — R2689 Other abnormalities of gait and mobility: Secondary | ICD-10-CM | POA: Diagnosis not present

## 2022-07-22 DIAGNOSIS — M62541 Muscle wasting and atrophy, not elsewhere classified, right hand: Secondary | ICD-10-CM | POA: Diagnosis not present

## 2022-07-22 DIAGNOSIS — M62542 Muscle wasting and atrophy, not elsewhere classified, left hand: Secondary | ICD-10-CM | POA: Diagnosis not present

## 2022-07-22 DIAGNOSIS — M62562 Muscle wasting and atrophy, not elsewhere classified, left lower leg: Secondary | ICD-10-CM | POA: Diagnosis not present

## 2022-07-22 DIAGNOSIS — M62561 Muscle wasting and atrophy, not elsewhere classified, right lower leg: Secondary | ICD-10-CM | POA: Diagnosis not present

## 2022-07-22 DIAGNOSIS — M62511 Muscle wasting and atrophy, not elsewhere classified, right shoulder: Secondary | ICD-10-CM | POA: Diagnosis not present

## 2022-07-22 DIAGNOSIS — R2681 Unsteadiness on feet: Secondary | ICD-10-CM | POA: Diagnosis not present

## 2022-07-22 DIAGNOSIS — M62551 Muscle wasting and atrophy, not elsewhere classified, right thigh: Secondary | ICD-10-CM | POA: Diagnosis not present

## 2022-07-22 DIAGNOSIS — M62552 Muscle wasting and atrophy, not elsewhere classified, left thigh: Secondary | ICD-10-CM | POA: Diagnosis not present

## 2022-07-23 DIAGNOSIS — M62552 Muscle wasting and atrophy, not elsewhere classified, left thigh: Secondary | ICD-10-CM | POA: Diagnosis not present

## 2022-07-23 DIAGNOSIS — M62561 Muscle wasting and atrophy, not elsewhere classified, right lower leg: Secondary | ICD-10-CM | POA: Diagnosis not present

## 2022-07-23 DIAGNOSIS — R2689 Other abnormalities of gait and mobility: Secondary | ICD-10-CM | POA: Diagnosis not present

## 2022-07-23 DIAGNOSIS — M62562 Muscle wasting and atrophy, not elsewhere classified, left lower leg: Secondary | ICD-10-CM | POA: Diagnosis not present

## 2022-07-23 DIAGNOSIS — M62542 Muscle wasting and atrophy, not elsewhere classified, left hand: Secondary | ICD-10-CM | POA: Diagnosis not present

## 2022-07-23 DIAGNOSIS — M62541 Muscle wasting and atrophy, not elsewhere classified, right hand: Secondary | ICD-10-CM | POA: Diagnosis not present

## 2022-07-23 DIAGNOSIS — R2681 Unsteadiness on feet: Secondary | ICD-10-CM | POA: Diagnosis not present

## 2022-07-23 DIAGNOSIS — M62551 Muscle wasting and atrophy, not elsewhere classified, right thigh: Secondary | ICD-10-CM | POA: Diagnosis not present

## 2022-07-23 DIAGNOSIS — M62511 Muscle wasting and atrophy, not elsewhere classified, right shoulder: Secondary | ICD-10-CM | POA: Diagnosis not present

## 2022-07-25 DIAGNOSIS — R2689 Other abnormalities of gait and mobility: Secondary | ICD-10-CM | POA: Diagnosis not present

## 2022-07-25 DIAGNOSIS — M62542 Muscle wasting and atrophy, not elsewhere classified, left hand: Secondary | ICD-10-CM | POA: Diagnosis not present

## 2022-07-25 DIAGNOSIS — M62511 Muscle wasting and atrophy, not elsewhere classified, right shoulder: Secondary | ICD-10-CM | POA: Diagnosis not present

## 2022-07-25 DIAGNOSIS — M62562 Muscle wasting and atrophy, not elsewhere classified, left lower leg: Secondary | ICD-10-CM | POA: Diagnosis not present

## 2022-07-25 DIAGNOSIS — M62551 Muscle wasting and atrophy, not elsewhere classified, right thigh: Secondary | ICD-10-CM | POA: Diagnosis not present

## 2022-07-25 DIAGNOSIS — M62561 Muscle wasting and atrophy, not elsewhere classified, right lower leg: Secondary | ICD-10-CM | POA: Diagnosis not present

## 2022-07-25 DIAGNOSIS — R2681 Unsteadiness on feet: Secondary | ICD-10-CM | POA: Diagnosis not present

## 2022-07-25 DIAGNOSIS — M62552 Muscle wasting and atrophy, not elsewhere classified, left thigh: Secondary | ICD-10-CM | POA: Diagnosis not present

## 2022-07-25 DIAGNOSIS — M62541 Muscle wasting and atrophy, not elsewhere classified, right hand: Secondary | ICD-10-CM | POA: Diagnosis not present

## 2022-07-26 DIAGNOSIS — M62541 Muscle wasting and atrophy, not elsewhere classified, right hand: Secondary | ICD-10-CM | POA: Diagnosis not present

## 2022-07-26 DIAGNOSIS — R2689 Other abnormalities of gait and mobility: Secondary | ICD-10-CM | POA: Diagnosis not present

## 2022-07-26 DIAGNOSIS — M62511 Muscle wasting and atrophy, not elsewhere classified, right shoulder: Secondary | ICD-10-CM | POA: Diagnosis not present

## 2022-07-26 DIAGNOSIS — R2681 Unsteadiness on feet: Secondary | ICD-10-CM | POA: Diagnosis not present

## 2022-07-26 DIAGNOSIS — M62552 Muscle wasting and atrophy, not elsewhere classified, left thigh: Secondary | ICD-10-CM | POA: Diagnosis not present

## 2022-07-26 DIAGNOSIS — M62551 Muscle wasting and atrophy, not elsewhere classified, right thigh: Secondary | ICD-10-CM | POA: Diagnosis not present

## 2022-07-26 DIAGNOSIS — M62561 Muscle wasting and atrophy, not elsewhere classified, right lower leg: Secondary | ICD-10-CM | POA: Diagnosis not present

## 2022-07-26 DIAGNOSIS — M62562 Muscle wasting and atrophy, not elsewhere classified, left lower leg: Secondary | ICD-10-CM | POA: Diagnosis not present

## 2022-07-26 DIAGNOSIS — M62542 Muscle wasting and atrophy, not elsewhere classified, left hand: Secondary | ICD-10-CM | POA: Diagnosis not present

## 2022-07-29 DIAGNOSIS — M62541 Muscle wasting and atrophy, not elsewhere classified, right hand: Secondary | ICD-10-CM | POA: Diagnosis not present

## 2022-07-29 DIAGNOSIS — M62552 Muscle wasting and atrophy, not elsewhere classified, left thigh: Secondary | ICD-10-CM | POA: Diagnosis not present

## 2022-07-29 DIAGNOSIS — M62542 Muscle wasting and atrophy, not elsewhere classified, left hand: Secondary | ICD-10-CM | POA: Diagnosis not present

## 2022-07-29 DIAGNOSIS — M62561 Muscle wasting and atrophy, not elsewhere classified, right lower leg: Secondary | ICD-10-CM | POA: Diagnosis not present

## 2022-07-29 DIAGNOSIS — M62511 Muscle wasting and atrophy, not elsewhere classified, right shoulder: Secondary | ICD-10-CM | POA: Diagnosis not present

## 2022-07-29 DIAGNOSIS — R2689 Other abnormalities of gait and mobility: Secondary | ICD-10-CM | POA: Diagnosis not present

## 2022-07-29 DIAGNOSIS — R2681 Unsteadiness on feet: Secondary | ICD-10-CM | POA: Diagnosis not present

## 2022-07-29 DIAGNOSIS — M62551 Muscle wasting and atrophy, not elsewhere classified, right thigh: Secondary | ICD-10-CM | POA: Diagnosis not present

## 2022-07-29 DIAGNOSIS — M62562 Muscle wasting and atrophy, not elsewhere classified, left lower leg: Secondary | ICD-10-CM | POA: Diagnosis not present

## 2022-07-30 ENCOUNTER — Emergency Department (HOSPITAL_COMMUNITY): Payer: Medicare HMO

## 2022-07-30 ENCOUNTER — Emergency Department (HOSPITAL_COMMUNITY)
Admission: EM | Admit: 2022-07-30 | Discharge: 2022-08-01 | Disposition: A | Discharge status: Rehabilitation Facility | Payer: Medicare HMO | Attending: Emergency Medicine | Admitting: Emergency Medicine

## 2022-07-30 ENCOUNTER — Encounter (HOSPITAL_COMMUNITY): Payer: Self-pay | Admitting: Radiology

## 2022-07-30 DIAGNOSIS — R011 Cardiac murmur, unspecified: Secondary | ICD-10-CM | POA: Diagnosis not present

## 2022-07-30 DIAGNOSIS — Z743 Need for continuous supervision: Secondary | ICD-10-CM | POA: Diagnosis not present

## 2022-07-30 DIAGNOSIS — M25462 Effusion, left knee: Secondary | ICD-10-CM | POA: Diagnosis not present

## 2022-07-30 DIAGNOSIS — R2681 Unsteadiness on feet: Secondary | ICD-10-CM | POA: Diagnosis not present

## 2022-07-30 DIAGNOSIS — Z79899 Other long term (current) drug therapy: Secondary | ICD-10-CM | POA: Insufficient documentation

## 2022-07-30 DIAGNOSIS — W19XXXA Unspecified fall, initial encounter: Secondary | ICD-10-CM | POA: Diagnosis not present

## 2022-07-30 DIAGNOSIS — R2689 Other abnormalities of gait and mobility: Secondary | ICD-10-CM | POA: Diagnosis not present

## 2022-07-30 DIAGNOSIS — M62551 Muscle wasting and atrophy, not elsewhere classified, right thigh: Secondary | ICD-10-CM | POA: Diagnosis not present

## 2022-07-30 DIAGNOSIS — M62511 Muscle wasting and atrophy, not elsewhere classified, right shoulder: Secondary | ICD-10-CM | POA: Diagnosis not present

## 2022-07-30 DIAGNOSIS — S0083XA Contusion of other part of head, initial encounter: Secondary | ICD-10-CM | POA: Diagnosis not present

## 2022-07-30 DIAGNOSIS — S81812A Laceration without foreign body, left lower leg, initial encounter: Secondary | ICD-10-CM | POA: Diagnosis not present

## 2022-07-30 DIAGNOSIS — M62552 Muscle wasting and atrophy, not elsewhere classified, left thigh: Secondary | ICD-10-CM | POA: Diagnosis not present

## 2022-07-30 DIAGNOSIS — W1839XA Other fall on same level, initial encounter: Secondary | ICD-10-CM | POA: Insufficient documentation

## 2022-07-30 DIAGNOSIS — M62561 Muscle wasting and atrophy, not elsewhere classified, right lower leg: Secondary | ICD-10-CM | POA: Diagnosis not present

## 2022-07-30 DIAGNOSIS — R58 Hemorrhage, not elsewhere classified: Secondary | ICD-10-CM | POA: Diagnosis not present

## 2022-07-30 DIAGNOSIS — M62562 Muscle wasting and atrophy, not elsewhere classified, left lower leg: Secondary | ICD-10-CM | POA: Diagnosis not present

## 2022-07-30 DIAGNOSIS — Z23 Encounter for immunization: Secondary | ICD-10-CM | POA: Insufficient documentation

## 2022-07-30 DIAGNOSIS — Z043 Encounter for examination and observation following other accident: Secondary | ICD-10-CM | POA: Diagnosis not present

## 2022-07-30 DIAGNOSIS — M62541 Muscle wasting and atrophy, not elsewhere classified, right hand: Secondary | ICD-10-CM | POA: Diagnosis not present

## 2022-07-30 DIAGNOSIS — I10 Essential (primary) hypertension: Secondary | ICD-10-CM | POA: Insufficient documentation

## 2022-07-30 DIAGNOSIS — M25461 Effusion, right knee: Secondary | ICD-10-CM | POA: Diagnosis not present

## 2022-07-30 DIAGNOSIS — S0990XA Unspecified injury of head, initial encounter: Secondary | ICD-10-CM | POA: Diagnosis not present

## 2022-07-30 DIAGNOSIS — Y92129 Unspecified place in nursing home as the place of occurrence of the external cause: Secondary | ICD-10-CM | POA: Insufficient documentation

## 2022-07-30 DIAGNOSIS — S8992XA Unspecified injury of left lower leg, initial encounter: Secondary | ICD-10-CM | POA: Diagnosis not present

## 2022-07-30 DIAGNOSIS — M62542 Muscle wasting and atrophy, not elsewhere classified, left hand: Secondary | ICD-10-CM | POA: Diagnosis not present

## 2022-07-30 MED ORDER — PRESERVISION AREDS 2 PO CAPS: 1.0000 | ORAL_CAPSULE | Freq: Two times a day (BID) | ORAL | Status: DC

## 2022-07-30 MED ORDER — POLYETHYL GLYC-PROPYL GLYC PF 0.4-0.3 % OP SOLN: 1.0000 [drp] | Freq: Four times a day (QID) | OPHTHALMIC | Status: DC

## 2022-07-30 MED ORDER — ESCITALOPRAM OXALATE 10 MG PO TABS: 10.0000 mg | ORAL_TABLET | Freq: Every day | ORAL | Status: DC

## 2022-07-30 MED ORDER — HYPROMELLOSE 0.3 % OP GEL: 1.0000 | Freq: Every day | OPHTHALMIC | Status: DC

## 2022-07-30 MED ORDER — LIDOCAINE-EPINEPHRINE (PF) 2 %-1:200000 IJ SOLN
10.0000 mL | Freq: Once | INTRAMUSCULAR | Status: AC
  Administered 2022-07-30: 10 mL via INTRADERMAL
  Filled 2022-07-30: qty 20

## 2022-07-30 MED ORDER — OYSTER SHELL CALCIUM/D3 500-5 MG-MCG PO TABS
1.0000 | ORAL_TABLET | Freq: Every day | ORAL | Status: DC
  Administered 2022-07-31 – 2022-08-01 (×2): 1 via ORAL
  Filled 2022-07-30 (×2): qty 1

## 2022-07-30 MED ORDER — AMLODIPINE BESYLATE 5 MG PO TABS
5.0000 mg | ORAL_TABLET | Freq: Every day | ORAL | Status: DC
  Administered 2022-07-30 – 2022-08-01 (×3): 5 mg via ORAL
  Filled 2022-07-30 (×3): qty 1

## 2022-07-30 MED ORDER — OMEGA-3-ACID ETHYL ESTERS 1 G PO CAPS
1.0000 g | ORAL_CAPSULE | Freq: Two times a day (BID) | ORAL | Status: DC
  Administered 2022-07-30 – 2022-08-01 (×5): 1 g via ORAL
  Filled 2022-07-30 (×5): qty 1

## 2022-07-30 MED ORDER — ARTIFICIAL TEARS OPHTHALMIC OINT
TOPICAL_OINTMENT | Freq: Every day | OPHTHALMIC | Status: DC
  Filled 2022-07-30 (×2): qty 3.5

## 2022-07-30 MED ORDER — PROSIGHT PO TABS
1.0000 | ORAL_TABLET | Freq: Every day | ORAL | Status: DC
  Administered 2022-07-31 – 2022-08-01 (×2): 1 via ORAL
  Filled 2022-07-30 (×2): qty 1

## 2022-07-30 MED ORDER — TETANUS-DIPHTH-ACELL PERTUSSIS 5-2.5-18.5 LF-MCG/0.5 IM SUSY
0.5000 mL | PREFILLED_SYRINGE | Freq: Once | INTRAMUSCULAR | Status: AC
  Administered 2022-07-30: 0.5 mL via INTRAMUSCULAR
  Filled 2022-07-30: qty 0.5

## 2022-07-30 MED ORDER — ACETAMINOPHEN 500 MG PO TABS: 500.0000 mg | ORAL_TABLET | Freq: Four times a day (QID) | ORAL | Status: DC | PRN

## 2022-07-30 MED ORDER — POLYVINYL ALCOHOL 1.4 % OP SOLN
1.0000 [drp] | Freq: Four times a day (QID) | OPHTHALMIC | Status: DC
  Administered 2022-08-01 (×2): 1 [drp] via OPHTHALMIC
  Filled 2022-07-30: qty 15

## 2022-07-30 NOTE — ED Provider Notes (Signed)
[facility] Provider Note   CSN: [id number] Arrival date & time: 07/30/22  [id number]     History  Chief Complaint  Patient presents with   Fall   Leg Injury    Lauren Davidson is a 89 y.o. female with PMHx of HLD, HTN, chronic back pain, L2 compression fx s/o kyphoplasty on 06/20/22, hx atraumatic L4 compression fx 09/2021 who presents to the ED for evaluation after a fall.   Patient was recently admitted to [facility] from 1/24 to 2/6 for acute compression fracture of L2  that occurred after twisting motion without trauma. She ultimately underwent kyphoplasty on 2/1.  Her MRI also showed subacute compression fracture involving L4 which was not amenable to surgical intervention- this also appears to have been atraumatic and occurred 09/2021. She was ultimately d/c back to [facility] independent living facility.  Patient reports that she awoke this morning, walked to the bathroom and had a bowel movement.  She is able to walk back to bed and went back to sleep.  She ambulates with a rollator.  When she awoke again, she got up and "collapsed". She is not quite sure what may have caused her to fall. She does not exactly recall the fall but also does not feel she lost consciousness. She was unable to get up on her own, unsure how long she was on the ground.  Per EMS she was down for about 3 hours.  She denies headache, neck pain, back pain, leg pain.  She has chronic urinary and bowel incontinence which has not worsened.  She denies any recent illness.  She denies any nausea or vomiting, abdominal pain. She also tells me about her recent admission for her L2 compression fracture and kyphoplasty.  She reports that she has been doing physical therapy for this. She feels her back pain is well controlled if she is not moving around. She tries not to take the hydrocodone as it makes her feel "loopy". She is not on blood thinners.         Home  Medications Prior to Admission medications   Medication Sig Start Date End Date Taking? Authorizing Provider  acetaminophen (TYLENOL) 500 MG tablet Take 500 mg by mouth every 6 (six) hours as needed (for pain).    [provider]  amLODipine (NORVASC) 5 MG tablet Take 1 tablet (5 mg total) by mouth daily. 06/26/22   [name], MD  Calcium Carb-Cholecalciferol (CALCIUM+D3 PO) Take 1 tablet by mouth daily with breakfast.    [provider]  docusate sodium (COLACE) 100 MG capsule Take 1 capsule (100 mg total) by mouth 2 (two) times daily. 09/20/21   Donne Hazel, MD  escitalopram (LEXAPRO) 10 MG tablet Take 10 mg by mouth daily.    [provider]  fish oil-omega-3 fatty acids 1000 MG capsule Take 1,000 mg by mouth 2 (two) times daily after a meal.    [provider]  lidocaine (LIDODERM) 5 % Place 1 patch onto the skin daily. Remove & Discard patch within 12 hours or as directed by MD 06/26/22   [name], MD  Multiple Vitamins-Minerals (MULTIVITAMIN WITH MINERALS) tablet Take 1 tablet by mouth daily with lunch.    [provider]  Multiple Vitamins-Minerals (PRESERVISION AREDS 2) CAPS Take 1 capsule by mouth in the morning and at bedtime.    [provider]  SYSTANE OVERNIGHT THERAPY 0.3 % GEL ophthalmic ointment Place 1  application. into both eyes at bedtime.    [provider]  SYSTANE ULTRA PF 0.4-0.3 % SOLN Place 1 drop into both eyes 4 (four) times daily.    [provider]      Allergies    Statins, Buprenorphine hcl, Buspirone, Lexapro [escitalopram], Lovastatin, Meperidine, and Morphine and related    Review of Systems   Review of Systems  All other systems reviewed and are negative.   Physical Exam Updated Vital Signs BP (!) 143/78   Pulse 80   Temp 97.8 F (36.6 C) (Oral)   Resp 17   SpO2 97%  Physical Exam Constitutional:      General: She is not in acute distress.    Appearance: Normal  appearance. She is not ill-appearing.     Comments: Elderly, pleasant   HENT:     Head:      Comments: Skin tear/slight ecchymosis to right forehead     Nose: Nose normal.     Mouth/Throat:     Mouth: Mucous membranes are dry.  Eyes:     Conjunctiva/sclera: Conjunctivae normal.  Cardiovascular:     Rate and Rhythm: Normal rate.     Heart sounds: Murmur heard.     Comments: [id number] holosystolic murmur Pulmonary:     Effort: Pulmonary effort is normal. No respiratory distress.     Breath sounds: Normal breath sounds. No wheezing.  Abdominal:     General: Bowel sounds are normal. There is no distension.     Palpations: Abdomen is soft.     Tenderness: There is no abdominal tenderness.  Musculoskeletal:        General: No swelling. Normal range of motion.     Cervical back: Normal range of motion and neck supple. No tenderness.     Comments: B/l knees with slight effusion.  Skin:    General: Skin is warm and dry.     Capillary Refill: Capillary refill takes less than 2 seconds.     Comments: Approximately 10.5cm x2.5 cm laceration to left lateral leg with adipose tissue exposed. Skin tear to left upper arm. Skin tear to left knee. Ecchymosis to left lateral knee. Skin tear to right-side forehead.    Neurological:     General: No focal deficit present.     Mental Status: She is alert and oriented to person, place, and time. Mental status is at baseline.         ED Results / Procedures / Treatments   Labs (all labs ordered are listed, but only abnormal results are displayed) Labs Reviewed - No data to display  EKG None  Radiology CT HEAD WO CONTRAST (5MM)  Result Date: 07/30/2022 CLINICAL DATA:  Geriatric fall, head trauma EXAM: CT HEAD WITHOUT CONTRAST TECHNIQUE: Contiguous axial images were obtained from the base of the skull through the vertex without intravenous contrast. RADIATION DOSE REDUCTION: This exam was performed according to the departmental dose-optimization  program which includes automated exposure control, adjustment of the mA and/or kV according to patient size and/or use of iterative reconstruction technique. COMPARISON:  None Available. FINDINGS: Brain: No evidence of acute infarction, hemorrhage, hydrocephalus, extra-axial collection or mass lesion/mass effect. Dystrophic calcification in the inferior left frontal white matter. Patchy white matter hypodensities, nonspecific but compatible with chronic microvascular ischemic disease. Vascular: No hyperdense vessel identified. Skull: No acute fracture. Sinuses/Orbits: Remote right medial orbital wall fracture. No acute orbital finding. Other: No mastoid effusions. IMPRESSION: No evidence of acute intracranial abnormality. Electronically Signed  By: Margaretha Sheffield M.D.   On: 07/30/2022 13:06   DG Cervical Spine 2-3 Views  Result Date: 07/30/2022 CLINICAL DATA:  fall EXAM: CERVICAL SPINE - 2-3 VIEW COMPARISON:  None Available. FINDINGS: Mild reversal of the normal cervical lordosis. No fracture, dislocation, or spondylolisthesis. No prevertebral soft tissue swelling. Mild narrowing of interspaces C3-C7, with small endplate spurs. Aortic Atherosclerosis (ICD10-170.0). Left carotid calcifications. Dental restorations. IMPRESSION: 1. Negative for fracture or other acute finding. 2. Mild cervical spondylitic changes C3-C7. Electronically Signed   By: Lucrezia Europe M.D.   On: 07/30/2022 11:59   DG Knee Complete 4 Views Left  Result Date: 07/30/2022 CLINICAL DATA:  fall, deep laceration left lateral leg EXAM: LEFT KNEE - COMPLETE 4+ VIEW COMPARISON:  None Available. FINDINGS: No fracture or dislocation. No effusion. Diffuse osteopenia. Chondrocalcinosis in medial and lateral compartments. Early marginal spurs from the patellar articular surface. Subcutaneous emphysema lateral to the proximal fibular shaft. No radiodense foreign body. Vascular calcifications in the calf. IMPRESSION: 1. No fracture or other acute  finding. 2. Subcutaneous emphysema lateral to the proximal fibular shaft. Electronically Signed   By: Lucrezia Europe M.D.   On: 07/30/2022 11:47   DG Tibia/Fibula Left  Result Date: 07/30/2022 CLINICAL DATA:  fall, deep laceration left lateral leg EXAM: LEFT TIBIA AND FIBULA - 2 VIEW COMPARISON:  None Available. FINDINGS: Subcutaneous emphysema lateral to the proximal fibular shaft. No radiodense foreign body. No fracture or dislocation. Diffuse osteopenia. Degenerative changes in the left knee. Vascular calcifications in the calf. IMPRESSION: Subcutaneous emphysema lateral to the proximal fibular shaft. Electronically Signed   By: Lucrezia Europe M.D.   On: 07/30/2022 11:45    Procedures .Marland KitchenLaceration Repair  Date/Time: 07/30/2022 2:39 PM  Performed by: Sharion Settler, DO Authorized by: Isla Pence, MD   Consent:    Consent obtained:  Verbal   Consent given by:  Patient   Risks, benefits, and alternatives were discussed: yes     Risks discussed:  Infection, pain and poor cosmetic result Universal protocol:    Procedure explained and questions answered to patient or proxy's satisfaction: yes     Imaging studies available: yes     Patient identity confirmed:  Verbally with patient and arm band Anesthesia:    Anesthesia method:  Local infiltration   Local anesthetic:  Lidocaine 1% WITH epi Laceration details:    Location:  Leg   Leg location:  L lower leg   Length (cm):  10.5 Pre-procedure details:    Preparation:  Patient was prepped and draped in usual sterile fashion and imaging obtained to evaluate for foreign bodies Exploration:    Hemostasis achieved with:  Direct pressure   Imaging obtained: x-ray     Imaging outcome: foreign body not noted     Wound exploration: entire depth of wound visualized   Treatment:    Area cleansed with:  Chlorhexidine   Amount of cleaning:  Standard   Irrigation solution:  Sterile saline   Irrigation volume:  10   Irrigation method:  Syringe    Debridement:  None Skin repair:    Repair method:  Sutures   Suture size:  3-0   Suture material:  Nylon   Suture technique:  Simple interrupted   Number of sutures:  14 Approximation:    Approximation:  Close Repair type:    Repair type:  Intermediate Post-procedure details:    Dressing:  Non-adherent dressing   Procedure completion:  Tolerated well, no immediate complications  Medications Ordered in ED Medications  amLODipine (NORVASC) tablet 5 mg (has no administration in time range)  polyvinyl alcohol (LIQUIFILM TEARS) 1.4 % ophthalmic solution 1 drop (has no administration in time range)  artificial tears (LACRILUBE) ophthalmic ointment (has no administration in time range)  omega-3 acid ethyl esters (LOVAZA) capsule 1 g (has no administration in time range)  calcium-vitamin D (OSCAL WITH D) 500-5 MG-MCG per tablet 1 tablet (has no administration in time range)  multivitamin (PROSIGHT) tablet 1 tablet (has no administration in time range)  Tdap (BOOSTRIX) injection 0.5 mL (0.5 mLs Intramuscular Given 07/30/22 1155)  lidocaine-EPINEPHrine (XYLOCAINE W/EPI) 2 %-1:200000 (PF) injection 10 mL (10 mLs Intradermal Given by Other 07/30/22 1325)    ED Course/ Medical Decision Making/ A&P                             Medical Decision Making 89 y/o F with hx of atraumatic compression fractures who presents s/p fall this morning. Examination notable for multiple skin tears and a deep and large laceration to left lateral leg. Will give Tdap booster. Will image head, c-spine, and left leg/knee given trauma. ROM normal, no pain currently. Mentating well and neck supple with no cervical neck ttp. Unclear cause of fall. Could be caused by arrhythmia- will obtain EKG. Does have known MVP. Could also be due to deconditioning/imbalance vs orthostasis. \  11:32 AM Reviewed recent echo 1/24 which showed normal EF, G1DD, severe mitral calcifications, mild mitral regurgitation, increased RV  pressure and overload. Pt in radiology obtaining imaging now.   11:42 AM reviewed EKG- abnormal from previous but with significant artifact present.   12:36 PM repeat EKG without ST changes, NSR. No acute fx noted on x-rays. Emphysema noted to lateral leg from open wound. Pt to CT now, will suture left leg when she returns.   2:42 PM 14 non-absorbable sutures placed to left lower leg. Patient tolerated procedure well. Discussed signs of infection. Discussed wound care- should keep dressing clean and dry for 24 hours and then change daily or as needed if dirty. Suture removal in 7-10 days. Will have patient ambulate to ensure pain is tolerable and then anticipate d/c home.   2:50 PM Per NT note, patient able to stand with assistance but unable to ambulate. She states she lives in an independent living facility but looking to transition to assisted living facility (had an appointment this afternoon). Will consult TOC for SNF and PT for evaluation for safest disposition.   3:09 PM updated patient and son in law with plan. They are agreeable to plan. Home meds ordered- patient states she has stopped the amlodipine and lexapro. Still would like to hold off on pain medications as she denies being in pain if not moving. Placed in [place] boarder status. Appreciate TOC and PT assistance with safe disposition.   Amount and/or Complexity of Data Reviewed Radiology: ordered.  Risk OTC drugs. Prescription drug management.         Final Clinical Impression(s) / ED Diagnoses Final diagnoses:  Fall, initial encounter  Contusion of face, initial encounter  Laceration of left lower extremity, initial encounter    Rx / DC Orders ED Discharge Orders     None         Sharion Settler, DO 07/30/22 1517    Isla Pence, MD 07/30/22 [phone]

## 2022-07-30 NOTE — Progress Notes (Signed)
Awaiting PT. TOC will continue to follow.

## 2022-07-30 NOTE — ED Notes (Signed)
PA in room suturing leg

## 2022-07-30 NOTE — Discharge Instructions (Addendum)
You sustained a fall. We did x-rays that did not show any fracture of your left leg or knee. We did a CT of your head which was negative for acute fracture or bleed. We sutured your left leg. You received a tetanus booster. You had 14 stitches to your left leg. You should have these removed in 7-10 days. Please follow up with your primary care physician or the urgent care for removal.   Please return for any signs of infection to your leg. These symptoms include fever, streaking redness, increased warmth, discolored drainage, and increased pain.   Please keep the dressing dry and intact for the next 24 hours. You may then change the dressing daily or as needed if it gets dirty.  Be sure to always use your rollator when ambulating. Take your time when getting up from a seated position.

## 2022-07-30 NOTE — ED Triage Notes (Signed)
Pt BIB [facility] from [facility] for unwitnessed fall, got up out of bed and then went to the floor. Does not think she lost consciousness. Down about 3 hours. 10cm x4cm avulsion to left outer shin. 2cm skin tear to right forehead with mild swelling. Pain to left leg, denies other pain or sx. Denies CP, SHOB. Hx compression fracture. VSS. No thinners.

## 2022-07-30 NOTE — ED Notes (Signed)
Attempted to ambulate pt, pt was able to stand with a lot of assistance, was not able ambulate

## 2022-07-30 NOTE — ED Notes (Signed)
Pt. currently at CT scan .  

## 2022-07-30 NOTE — ED Notes (Signed)
ED Provider at bedside. 

## 2022-07-30 NOTE — Progress Notes (Signed)
Transition of Care [facility]) - Emergency Department Mini Assessment   Patient Details  Name: Lauren Davidson MRN: [id number] Date of Birth: 1922/02/15  [facility]) CM/SW Contact:    Rodney Booze, LCSW Phone Number: 07/30/2022, 7:00 PM   Clinical Narrative: CSW spoke to the patient about SNF placement the patient is aware and awaiting PT.    ED Mini Assessment: What brought you to the Emergency Department? : (P) FALL  Barriers to Discharge: (P) No Barriers Identified  Barrier interventions: (P) WOuld like to go to SNF to get better     Interventions which prevented an admission or readmission: SNF Placement    Patient Contact and Communications     Spoke with: (P) Patient Contact Date: (P) 07/30/22,   Contact time: (P) 1837      Patient states their goals for this hospitalization and ongoing recovery are:: (P) Would like to go to SNF to get stonger CMS Medicare.gov Compare Post Acute Care list provided to:: (P) Patient Choice offered to / list presented to : (P) Patient  Admission diagnosis:  fall Patient Active Problem List   Diagnosis Date Noted   Closed compression fracture of L2 lumbar vertebra, initial encounter ([place]) 06/16/2022   Lumbar compression fracture ([place]) 06/12/2022   Back pain 09/17/2021   Closed compression fracture of L4 lumbar vertebra, initial encounter ([place]) 09/17/2021   Lumbar spinal stenosis 09/17/2021   HTN (hypertension) 09/17/2021   HLD (hyperlipidemia) 09/17/2021   Gastroesophageal reflux disease without esophagitis 03/01/2019   Sjogren's syndrome ([place]) 05/18/2015   Chronic kidney disease (CKD), stage II (mild) 05/18/2015   Generalized anxiety disorder 05/18/2015   Osteoporosis 05/18/2015   Status post intraocular lens implant 11/14/2011   PCP:  Lajean Manes, MD Pharmacy:   [id number] [place] [address]Phone: [phone]:  [phone] [id number] [address] [place]: [phone]: [phone]

## 2022-07-31 DIAGNOSIS — S81812A Laceration without foreign body, left lower leg, initial encounter: Secondary | ICD-10-CM | POA: Diagnosis not present

## 2022-07-31 NOTE — Progress Notes (Addendum)
Spoke with Lauren Davidson who is pt's Dual POA. Lauren Davidson reports that their preferred facilities are: [facility], [facility], and [facility]. This CSW informed she'd follow up with SNFs.   [place] and [place]: OON  Addend @ 2:02 PM Family wishes for TOC to follow up with [facility], [place], and [place].   [facility], [facility]  and [facility]: OON

## 2022-07-31 NOTE — Progress Notes (Addendum)
Presented bed offers to pt and Lauren Davidson again - Lauren Davidson wishes to research some of the facilities. Informed pt and Lauren Davidson that a decision would need to be made so our team can submit for ins auth. AUTH WILL NEED TO BE INITIATED ONCE A BED IS CHOSEN BY FAMILY. CONTACT SNF SO THEY MAY BEGIN AUTH.

## 2022-07-31 NOTE — Evaluation (Signed)
Physical Therapy Evaluation Patient Details Name: Lauren Davidson MRN: [id number] DOB: 03-12-1922 Today's Date: 07/31/2022  History of Present Illness  89 yo female admitted with a fall and inability to ambulate. Hx of L2 comp fx s/p KP 06/20/22. back pain, CKD, spinal stenosis, osteoporosis, anxiety, Sjogren's.  Clinical Impression  On eval in ED, pt required Min-Mod A for mobility. She walked ~7 feet x 2 (to and from bathroom) with a RW. Bowel incontinence-Mod A for hygiene/clean up. Pt reports back pain with activity. Pt presents with general weakness, decreased activity tolerance, and impaired gait and balance. Discussed d/c plan with pt and family-plan is for ST SNF rehab with potential plan to transition from Ind Living to ALF once able.        Recommendations for follow up therapy are one component of a multi-disciplinary discharge planning process, led by the attending physician.  Recommendations may be updated based on patient status, additional functional criteria and insurance authorization.  Follow Up Recommendations Skilled nursing-short term rehab (<3 hours/day) Can patient physically be transported by private vehicle: Yes    Assistance Recommended at Discharge Frequent or constant Supervision/Assistance  Patient can return home with the following  Assist for transportation;Assistance with cooking/housework;Help with stairs or ramp for entrance;A little help with walking and/or transfers;A little help with bathing/dressing/bathroom    Equipment Recommendations None recommended by PT  Recommendations for Other Services       Functional Status Assessment Patient has had a recent decline in their functional status and demonstrates the ability to make significant improvements in function in a reasonable and predictable amount of time.     Precautions / Restrictions Precautions Precautions: Fall Restrictions Weight Bearing Restrictions: No      Mobility  Bed Mobility Overal  bed mobility: Needs Assistance Bed Mobility: Supine to Sit, Sit to Supine     Supine to sit: HOB elevated, Min guard Sit to supine: Min assist, HOB elevated   General bed mobility comments: Assist for LEs back onto bed. Increased time. Pt relied on bedrail.    Transfers Overall transfer level: Needs assistance Equipment used: Rolling walker (2 wheels) Transfers: Sit to/from Stand Sit to Stand: Min assist, Mod assist           General transfer comment: Min A from elevated bed. Mod A from low toilet, even with grabbar. Increased time. Multimodal cueing required due to hearing deficits.    Ambulation/Gait Ambulation/Gait assistance: Min assist Gait Distance (Feet): 7 Feet (x2) Assistive device: Rolling walker (2 wheels) Gait Pattern/deviations: Step-through pattern, Trunk flexed, Decreased stride length       General Gait Details: Multimodal cueing required due to hearing deficits. Assist to steady pt and maneuver with RW. Cues for safety, technique, hand placement. Increased time. Distance limited by fatigue, pain  Stairs            Wheelchair Mobility    Modified Rankin (Stroke Patients Only)       Balance Overall balance assessment: Needs assistance, History of Falls         Standing balance support: Bilateral upper extremity supported, Reliant on assistive device for balance, During functional activity Standing balance-Leahy Scale: Poor                               Pertinent Vitals/Pain Pain Assessment Pain Assessment: Faces Faces Pain Scale: Hurts even more Pain Location: back Pain Descriptors / Indicators: Grimacing Pain Intervention(s): Limited activity within  patient's tolerance, Monitored during session, Repositioned    Home Living Family/patient expects to be discharged to:: Skilled nursing facility Living Arrangements: Alone Available Help at Discharge: Family;Friend(s);Available PRN/intermittently;Personal care attendant Type  of Home: Independent living facility [facility]) Home Access: Level entry       Home Layout: One level Home Equipment: Rollator (4 wheels)      Prior Function Prior Level of Function : Independent/Modified Independent                     Hand Dominance        Extremity/Trunk Assessment   Upper Extremity Assessment Upper Extremity Assessment: Generalized weakness    Lower Extremity Assessment Lower Extremity Assessment: Generalized weakness    Cervical / Trunk Assessment Cervical / Trunk Assessment: Kyphotic  Communication   Communication: HOH  Cognition Arousal/Alertness: Awake/alert Behavior During Therapy: WFL for tasks assessed/performed Overall Cognitive Status: Within Functional Limits for tasks assessed                                          General Comments      Exercises     Assessment/Plan    PT Assessment Patient needs continued PT services  PT Problem List Decreased strength;Decreased balance;Decreased range of motion;Decreased activity tolerance;Decreased mobility;Decreased cognition;Decreased knowledge of use of DME;Pain       PT Treatment Interventions DME instruction;Gait training;Functional mobility training;Therapeutic activities;Balance training;Patient/family education;Therapeutic exercise    PT Goals (Current goals can be found in the Care Plan section)  Acute Rehab PT Goals Patient Stated Goal: less pain. regain PLOF/independence. Possible plan to get into ALF after ST SNF rehab. PT Goal Formulation: With patient/family Time For Goal Achievement: 08/14/22 Potential to Achieve Goals: Fair    Frequency Min 2X/week     Co-evaluation               AM-PAC PT "6 Clicks" Mobility  Outcome Measure Help needed turning from your back to your side while in a flat bed without using bedrails?: A Little Help needed moving from lying on your back to sitting on the side of a flat bed without using bedrails?:  A Lot Help needed moving to and from a bed to a chair (including a wheelchair)?: A Little Help needed standing up from a chair using your arms (e.g., wheelchair or bedside chair)?: A Little Help needed to walk in hospital room?: A Little Help needed climbing 3-5 steps with a railing? : Total 6 Click Score: 15    End of Session Equipment Utilized During Treatment: Gait belt Activity Tolerance: Patient limited by fatigue;Patient limited by pain Patient left: in bed;with call bell/phone within reach;with bed alarm set;with family/visitor present   PT Visit Diagnosis: Pain;Muscle weakness (generalized) (M62.81);Difficulty in walking, not elsewhere classified (R26.2) Pain - part of body:  (back)    Time: [id number] PT Time Calculation (min) (ACUTE ONLY): 38 min   Charges:   PT Evaluation $PT Eval Low Complexity: 1 Low PT Treatments $Gait Training: 8-22 mins $Therapeutic Activity: 8-22 mins          Doreatha Massed, PT Acute Rehabilitation  Office: [phone]

## 2022-07-31 NOTE — ED Notes (Signed)
Breakfast tray delivered to patient. Pt denies further needs or requests at this time.

## 2022-07-31 NOTE — ED Notes (Signed)
PT at bedside assessing patient at this time.

## 2022-07-31 NOTE — ED Provider Notes (Signed)
Emergency Medicine Observation Re-evaluation Note  Lauren Davidson is a 89 y.o. female, seen on rounds today.  Pt initially presented to the ED for complaints of Fall and Leg Injury Currently, the patient  was taking a nap.  Physical Exam  BP (!) 146/77   Pulse 73   Temp 99.1 F (37.3 C) (Oral)   Resp 16   SpO2 95%  Physical Exam General: NAD Cardiac: regular Lungs: clear MSK:  leg wrapped with clean dressing.  No redness or swelling of the left leg  ED Course / MDM  EKG:EKG Interpretation  Date/Time:  Tuesday July 30 2022 11:45:23 EDT Ventricular Rate:  78 PR Interval:  198 QRS Duration: 88 QT Interval:  426 QTC Calculation: 485 R Axis:   -20 Text Interpretation: Normal sinus rhythm Cannot rule out Anterior infarct , age undetermined Abnormal ECG When compared with ECG of 30-Jul-2022 11:38, PREVIOUS ECG IS PRESENT Confirmed by Carmin Muskrat [phone]) on 07/31/2022 11:04:30 AM  I have reviewed the labs performed to date as well as medications administered while in observation.  Recent changes in the last 24 hours include PT eval and recommended SNF.  Plan  Current plan is for currently looking for placement.    Blanchie Dessert, MD 07/31/22 [phone]

## 2022-07-31 NOTE — ED Notes (Signed)
Pt. Was cleaned up and purewick applied. Moisture barrier cream applied.

## 2022-07-31 NOTE — Progress Notes (Signed)
CSW has spoken to Lauren Davidson has picked [facility] for the patient to go to. At this time CSW has ask [name] start Auth for this patient. TOC will continue to follow the patient for DC.

## 2022-07-31 NOTE — NC FL2 (Signed)
[place] LEVEL OF CARE FORM     IDENTIFICATION  Patient Name: Lauren Davidson Birthdate: [birth year] Sex: female Admission Date (Current Location): 07/30/2022  [facility] and [place] Number:  Herbalist and Address:  [facility],  [address], [place]      Provider Number: [id number]  Attending Physician Name and Address:  Default, Provider, MD  Relative Name and Phone Number:  Edd Arbour (relative) - [phone]    Current Level of Care: Hospital Recommended Level of Care: [place] Prior Approval Number:    Date Approved/Denied:   PASRR Number: [id number]  Discharge Plan: Home    Current Diagnoses: Patient Active Problem List   Diagnosis Date Noted   Closed compression fracture of L2 lumbar vertebra, initial encounter ([place]) 06/16/2022   Lumbar compression fracture ([place]) 06/12/2022   Back pain 09/17/2021   Closed compression fracture of L4 lumbar vertebra, initial encounter ([place]) 09/17/2021   Lumbar spinal stenosis 09/17/2021   HTN (hypertension) 09/17/2021   HLD (hyperlipidemia) 09/17/2021   Gastroesophageal reflux disease without esophagitis 03/01/2019   Sjogren's syndrome ([place]) 05/18/2015   Chronic kidney disease (CKD), stage II (mild) 05/18/2015   Generalized anxiety disorder 05/18/2015   Osteoporosis 05/18/2015   Status post intraocular lens implant 11/14/2011    Orientation RESPIRATION BLADDER Height & Weight     Self, Situation, Place  Normal Continent Weight:   Height:     BEHAVIORAL SYMPTOMS/MOOD NEUROLOGICAL BOWEL NUTRITION STATUS      Continent Diet (Regular)  AMBULATORY STATUS COMMUNICATION OF NEEDS Skin   Limited Assist Verbally Normal                       Personal Care Assistance Level of Assistance  Bathing, Feeding, Dressing Bathing Assistance: Limited assistance Feeding assistance: Limited assistance Dressing Assistance: Limited assistance     Functional  Limitations Info  Sight, Speech, Hearing Sight Info: Adequate Hearing Info: Adequate Speech Info: Adequate    SPECIAL CARE FACTORS FREQUENCY                       Contractures Contractures Info: Not present    Additional Factors Info  Code Status, Allergies, Psychotropic Code Status Info: Full Allergies Info: Statins  Buprenorphine Hcl  Buspirone  Lexapro (Escitalopram)  Lovastatin  Meperidine  Morphine And Related Psychotropic Info: None         Current Medications (07/31/2022):  This is the current hospital active medication list Current Facility-Administered Medications  Medication Dose Route Frequency Provider Last Rate Last Admin   amLODipine (NORVASC) tablet 5 mg  5 mg Oral Daily Espinoza, Alejandra, DO   5 mg at 07/31/22 [id number]   artificial tears (LACRILUBE) ophthalmic ointment   Both Eyes QHS Isla Pence, MD       calcium-vitamin D (OSCAL WITH D) 500-5 MG-MCG per tablet 1 tablet  1 tablet Oral Q0600 Sharion Settler, DO   1 tablet at 07/31/22 [id number]   multivitamin (PROSIGHT) tablet 1 tablet  1 tablet Oral Daily Isla Pence, MD   1 tablet at 07/31/22 [id number]   omega-3 acid ethyl esters (LOVAZA) capsule 1 g  1 g Oral BID PC Espinoza, Alejandra, DO   1 g at 07/31/22 [id number]   polyvinyl alcohol (LIQUIFILM TEARS) 1.4 % ophthalmic solution 1 drop  1 drop Both Eyes QID Isla Pence, MD       Current Outpatient Medications  Medication Sig Dispense Refill  acetaminophen (TYLENOL) 500 MG tablet Take 500 mg by mouth every 6 (six) hours as needed (for pain).     amLODipine (NORVASC) 5 MG tablet Take 1 tablet (5 mg total) by mouth daily.     Calcium Carb-Cholecalciferol (CALCIUM+D3 PO) Take 1 tablet by mouth daily with breakfast.     docusate sodium (COLACE) 100 MG capsule Take 1 capsule (100 mg total) by mouth 2 (two) times daily. 10 capsule 0   escitalopram (LEXAPRO) 10 MG tablet Take 10 mg by mouth daily.     fish oil-omega-3 fatty acids 1000 MG capsule Take 1,000 mg by  mouth 2 (two) times daily after a meal.     lidocaine (LIDODERM) 5 % Place 1 patch onto the skin daily. Remove & Discard patch within 12 hours or as directed by MD 30 patch 0   Multiple Vitamins-Minerals (MULTIVITAMIN WITH MINERALS) tablet Take 1 tablet by mouth daily with lunch.     Multiple Vitamins-Minerals (PRESERVISION AREDS 2) CAPS Take 1 capsule by mouth in the morning and at bedtime.     SYSTANE OVERNIGHT THERAPY 0.3 % GEL ophthalmic ointment Place 1 application. into both eyes at bedtime.     SYSTANE ULTRA PF 0.4-0.3 % SOLN Place 1 drop into both eyes 4 (four) times daily.       Discharge Medications: Please see discharge summary for a list of discharge medications.  Relevant Imaging Results:  Relevant Lab Results:   Additional Information SSN: [id number]  Kimber Relic, LCSW

## 2022-07-31 NOTE — ED Notes (Signed)
Brief checked. Clean and dry at this time. Purewick hooked up to suction. Pt denies needing to void at this time.

## 2022-08-01 DIAGNOSIS — S32040D Wedge compression fracture of fourth lumbar vertebra, subsequent encounter for fracture with routine healing: Secondary | ICD-10-CM | POA: Diagnosis not present

## 2022-08-01 DIAGNOSIS — S0083XA Contusion of other part of head, initial encounter: Secondary | ICD-10-CM | POA: Diagnosis not present

## 2022-08-01 DIAGNOSIS — L97922 Non-pressure chronic ulcer of unspecified part of left lower leg with fat layer exposed: Secondary | ICD-10-CM | POA: Diagnosis not present

## 2022-08-01 DIAGNOSIS — M545 Low back pain, unspecified: Secondary | ICD-10-CM | POA: Diagnosis not present

## 2022-08-01 DIAGNOSIS — M810 Age-related osteoporosis without current pathological fracture: Secondary | ICD-10-CM | POA: Diagnosis not present

## 2022-08-01 DIAGNOSIS — L0390 Cellulitis, unspecified: Secondary | ICD-10-CM | POA: Diagnosis not present

## 2022-08-01 DIAGNOSIS — Z9181 History of falling: Secondary | ICD-10-CM | POA: Diagnosis not present

## 2022-08-01 DIAGNOSIS — M6281 Muscle weakness (generalized): Secondary | ICD-10-CM | POA: Diagnosis not present

## 2022-08-01 DIAGNOSIS — S8992XA Unspecified injury of left lower leg, initial encounter: Secondary | ICD-10-CM | POA: Diagnosis not present

## 2022-08-01 DIAGNOSIS — L97929 Non-pressure chronic ulcer of unspecified part of left lower leg with unspecified severity: Secondary | ICD-10-CM | POA: Diagnosis not present

## 2022-08-01 DIAGNOSIS — Y92129 Unspecified place in nursing home as the place of occurrence of the external cause: Secondary | ICD-10-CM | POA: Diagnosis not present

## 2022-08-01 DIAGNOSIS — Z23 Encounter for immunization: Secondary | ICD-10-CM | POA: Diagnosis not present

## 2022-08-01 DIAGNOSIS — W1839XA Other fall on same level, initial encounter: Secondary | ICD-10-CM | POA: Diagnosis not present

## 2022-08-01 DIAGNOSIS — R1313 Dysphagia, pharyngeal phase: Secondary | ICD-10-CM | POA: Diagnosis not present

## 2022-08-01 DIAGNOSIS — F4322 Adjustment disorder with anxiety: Secondary | ICD-10-CM | POA: Diagnosis not present

## 2022-08-01 DIAGNOSIS — M25462 Effusion, left knee: Secondary | ICD-10-CM | POA: Diagnosis not present

## 2022-08-01 DIAGNOSIS — R4182 Altered mental status, unspecified: Secondary | ICD-10-CM | POA: Diagnosis not present

## 2022-08-01 DIAGNOSIS — M8000XS Age-related osteoporosis with current pathological fracture, unspecified site, sequela: Secondary | ICD-10-CM | POA: Diagnosis not present

## 2022-08-01 DIAGNOSIS — B351 Tinea unguium: Secondary | ICD-10-CM | POA: Diagnosis not present

## 2022-08-01 DIAGNOSIS — Z681 Body mass index (BMI) 19 or less, adult: Secondary | ICD-10-CM | POA: Diagnosis not present

## 2022-08-01 DIAGNOSIS — R5381 Other malaise: Secondary | ICD-10-CM | POA: Diagnosis not present

## 2022-08-01 DIAGNOSIS — I10 Essential (primary) hypertension: Secondary | ICD-10-CM | POA: Diagnosis not present

## 2022-08-01 DIAGNOSIS — I7091 Generalized atherosclerosis: Secondary | ICD-10-CM | POA: Diagnosis not present

## 2022-08-01 DIAGNOSIS — M25461 Effusion, right knee: Secondary | ICD-10-CM | POA: Diagnosis not present

## 2022-08-01 DIAGNOSIS — N189 Chronic kidney disease, unspecified: Secondary | ICD-10-CM | POA: Diagnosis not present

## 2022-08-01 DIAGNOSIS — K219 Gastro-esophageal reflux disease without esophagitis: Secondary | ICD-10-CM | POA: Diagnosis not present

## 2022-08-01 DIAGNOSIS — T8131XA Disruption of external operation (surgical) wound, not elsewhere classified, initial encounter: Secondary | ICD-10-CM | POA: Diagnosis not present

## 2022-08-01 DIAGNOSIS — S32000A Wedge compression fracture of unspecified lumbar vertebra, initial encounter for closed fracture: Secondary | ICD-10-CM | POA: Diagnosis not present

## 2022-08-01 DIAGNOSIS — R262 Difficulty in walking, not elsewhere classified: Secondary | ICD-10-CM | POA: Diagnosis not present

## 2022-08-01 DIAGNOSIS — F411 Generalized anxiety disorder: Secondary | ICD-10-CM | POA: Diagnosis not present

## 2022-08-01 DIAGNOSIS — Z7401 Bed confinement status: Secondary | ICD-10-CM | POA: Diagnosis not present

## 2022-08-01 DIAGNOSIS — E785 Hyperlipidemia, unspecified: Secondary | ICD-10-CM | POA: Diagnosis not present

## 2022-08-01 DIAGNOSIS — R011 Cardiac murmur, unspecified: Secondary | ICD-10-CM | POA: Diagnosis not present

## 2022-08-01 DIAGNOSIS — D631 Anemia in chronic kidney disease: Secondary | ICD-10-CM | POA: Diagnosis not present

## 2022-08-01 DIAGNOSIS — S32050D Wedge compression fracture of fifth lumbar vertebra, subsequent encounter for fracture with routine healing: Secondary | ICD-10-CM | POA: Diagnosis not present

## 2022-08-01 DIAGNOSIS — S81812A Laceration without foreign body, left lower leg, initial encounter: Secondary | ICD-10-CM | POA: Diagnosis not present

## 2022-08-01 DIAGNOSIS — R4181 Age-related cognitive decline: Secondary | ICD-10-CM | POA: Diagnosis not present

## 2022-08-01 DIAGNOSIS — S32020D Wedge compression fracture of second lumbar vertebra, subsequent encounter for fracture with routine healing: Secondary | ICD-10-CM | POA: Diagnosis not present

## 2022-08-01 DIAGNOSIS — Z79899 Other long term (current) drug therapy: Secondary | ICD-10-CM | POA: Diagnosis not present

## 2022-08-01 MED ORDER — ESCITALOPRAM OXALATE 10 MG PO TABS
10.0000 mg | ORAL_TABLET | Freq: Every day | ORAL | Status: DC
  Administered 2022-08-01: 10 mg via ORAL

## 2022-08-01 NOTE — NC FL2 (Signed)
[place] LEVEL OF CARE FORM     IDENTIFICATION  Patient Name: Lauren Davidson Birthdate: 1922-05-05 Sex: female Admission Date (Current Location): 07/30/2022  [facility] and [place] Number:  Herbalist and Address:  [facility] [place],  [place], [place]      Provider Number: [id number]  Attending Physician Name and Address:  Default, Provider, MD  Relative Name and Phone Number:  Edd Arbour (relative) - [phone]    Current Level of Care: Hospital Recommended Level of Care: [place] Prior Approval Number:    Date Approved/Denied:   PASRR Number: [id number]  Discharge Plan: Home    Current Diagnoses: Patient Active Problem List   Diagnosis Date Noted   Closed compression fracture of L2 lumbar vertebra, initial encounter ([place]) 06/16/2022   Lumbar compression fracture ([place]) 06/12/2022   Back pain 09/17/2021   Closed compression fracture of L4 lumbar vertebra, initial encounter ([place]) 09/17/2021   Lumbar spinal stenosis 09/17/2021   HTN (hypertension) 09/17/2021   HLD (hyperlipidemia) 09/17/2021   Gastroesophageal reflux disease without esophagitis 03/01/2019   Sjogren's syndrome ([place]) 05/18/2015   Chronic kidney disease (CKD), stage II (mild) 05/18/2015   Generalized anxiety disorder 05/18/2015   Osteoporosis 05/18/2015   Status post intraocular lens implant 11/14/2011    Orientation RESPIRATION BLADDER Height & Weight     Self, Situation, Place  Normal Continent Weight:   Height:     BEHAVIORAL SYMPTOMS/MOOD NEUROLOGICAL BOWEL NUTRITION STATUS      Continent Diet (Regular)  AMBULATORY STATUS COMMUNICATION OF NEEDS Skin   Limited Assist Verbally Normal                       Personal Care Assistance Level of Assistance  Bathing, Feeding, Dressing Bathing Assistance: Limited assistance Feeding assistance: Limited assistance Dressing Assistance: Limited assistance     Functional  Limitations Info  Sight, Speech, Hearing Sight Info: Adequate Hearing Info: Adequate Speech Info: Adequate    SPECIAL CARE FACTORS FREQUENCY                       Contractures Contractures Info: Not present    Additional Factors Info  Code Status, Allergies, Psychotropic Code Status Info: Full Allergies Info: Statins  Buprenorphine Hcl  Buspirone  Lexapro (Escitalopram)  Lovastatin  Meperidine  Morphine And Related Psychotropic Info: None         Current Medications (08/01/2022):  This is the current hospital active medication list Current Facility-Administered Medications  Medication Dose Route Frequency Provider Last Rate Last Admin   amLODipine (NORVASC) tablet 5 mg  5 mg Oral Daily Espinoza, Alejandra, DO   5 mg at 08/01/22 1027   artificial tears (LACRILUBE) ophthalmic ointment   Both Eyes QHS Isla Pence, MD   Given at 07/31/22 2224   calcium-vitamin D (OSCAL WITH D) 500-5 MG-MCG per tablet 1 tablet  1 tablet Oral Q0600 Sharion Settler, DO   1 tablet at 08/01/22 1027   escitalopram (LEXAPRO) tablet 10 mg  10 mg Oral Daily Blanchie Dessert, MD       multivitamin (PROSIGHT) tablet 1 tablet  1 tablet Oral Daily Isla Pence, MD   1 tablet at 08/01/22 1027   omega-3 acid ethyl esters (LOVAZA) capsule 1 g  1 g Oral BID PC Espinoza, Alejandra, DO   1 g at 08/01/22 1028   polyvinyl alcohol (LIQUIFILM TEARS) 1.4 % ophthalmic solution 1 drop  1 drop Both  Eyes QID Isla Pence, MD   1 drop at 08/01/22 1324   Current Outpatient Medications  Medication Sig Dispense Refill   acetaminophen (TYLENOL) 500 MG tablet Take 500 mg by mouth every 6 (six) hours as needed (for pain).     amLODipine (NORVASC) 5 MG tablet Take 1 tablet (5 mg total) by mouth daily.     Calcium Carb-Cholecalciferol (CALCIUM+D3 PO) Take 1 tablet by mouth daily with breakfast.     docusate sodium (COLACE) 100 MG capsule Take 1 capsule (100 mg total) by mouth 2 (two) times daily. 10 capsule 0    escitalopram (LEXAPRO) 10 MG tablet Take 10 mg by mouth daily.     fish oil-omega-3 fatty acids 1000 MG capsule Take 1,000 mg by mouth 2 (two) times daily after a meal.     lidocaine (LIDODERM) 5 % Place 1 patch onto the skin daily. Remove & Discard patch within 12 hours or as directed by MD 30 patch 0   Multiple Vitamins-Minerals (MULTIVITAMIN WITH MINERALS) tablet Take 1 tablet by mouth daily with lunch.     Multiple Vitamins-Minerals (PRESERVISION AREDS 2) CAPS Take 1 capsule by mouth in the morning and at bedtime.     SYSTANE OVERNIGHT THERAPY 0.3 % GEL ophthalmic ointment Place 1 application. into both eyes at bedtime.     SYSTANE ULTRA PF 0.4-0.3 % SOLN Place 1 drop into both eyes 4 (four) times daily.       Discharge Medications: Please see after visit summary for a list of discharge medications.  Relevant Imaging Results:  Relevant Lab Results:   Additional Information SSN: [id number]  Trystin Terhune C Tarpley-Carter, LCSWA

## 2022-08-01 NOTE — Progress Notes (Addendum)
Auth still pending.   Addend @ 11:43 AM Provided son in law an update.   Addend @ 1:35 PM Auth still pending.

## 2022-08-01 NOTE — Progress Notes (Signed)
TOC CSW has received call report information for [facility].  The information is as follows:  Room #:  520-1  Call Report #:  [phone]  This information has been shared with RN and that pt must be received by 9pm for acceptance.  CSW asked RN to share this information with PTAR.  Lauren Davidson, MSW, LCSW-A Pronouns:  She/Her/Hers [facility] HealthTransitions of Care Clinical Social Worker Direct Number:  [phone] [email or web address]

## 2022-08-01 NOTE — ED Provider Notes (Signed)
Emergency Medicine Observation Re-evaluation Note  Lauren Davidson is a 89 y.o. female, seen on rounds today.  Pt initially presented to the ED for complaints of Fall and Leg Injury Currently, the patient is pt eating lunch and watching TV.  No complaints at this time.  Physical Exam  BP 136/75 (BP Location: Right Arm)   Pulse 76   Temp 98.6 F (37 C) (Oral)   Resp 18   SpO2 95%  Physical Exam General: NAD Cardiac: rr Lungs: no distress Left leg bandage is clean  ED Course / MDM  EKG:EKG Interpretation  Date/Time:  Tuesday July 30 2022 11:45:23 EDT Ventricular Rate:  78 PR Interval:  198 QRS Duration: 88 QT Interval:  426 QTC Calculation: 485 R Axis:   -20 Text Interpretation: Normal sinus rhythm Cannot rule out Anterior infarct , age undetermined Abnormal ECG When compared with ECG of 30-Jul-2022 11:38, PREVIOUS ECG IS PRESENT Confirmed by Carmin Muskrat [phone]) on 07/31/2022 11:04:30 AM  I have reviewed the labs performed to date as well as medications administered while in observation.  Recent changes in the last 24 hours include none.  Plan  Current plan is for waiting on placement.    Blanchie Dessert, MD 08/01/22 1300

## 2022-08-01 NOTE — ED Provider Notes (Signed)
See same-day note from other provider.  I was called by social work as the patient has been accepted to a nursing facility..  Vital signs at baseline, patient has been medically cleared for discharge.    Tretha Sciara, MD 08/01/22 1704

## 2022-08-01 NOTE — ED Notes (Signed)
PTAR called  

## 2022-08-01 NOTE — Progress Notes (Signed)
TOC CSW has received approval from [place].  Approval info as follows:  Approved Starting:  3/14 - 3/23  Review Date:  [id number]   Certification #:  [id number]   Bodi Palmeri Tarpley-Carter, MSW, LCSW-A Pronouns:  She/Her/Hers [facility] HealthTransitions of Care Clinical Social Worker Direct Number:  [phone] [email or web address]

## 2022-08-05 DIAGNOSIS — I10 Essential (primary) hypertension: Secondary | ICD-10-CM | POA: Diagnosis not present

## 2022-08-05 DIAGNOSIS — L0390 Cellulitis, unspecified: Secondary | ICD-10-CM | POA: Diagnosis not present

## 2022-08-05 DIAGNOSIS — M8000XS Age-related osteoporosis with current pathological fracture, unspecified site, sequela: Secondary | ICD-10-CM | POA: Diagnosis not present

## 2022-08-05 DIAGNOSIS — R5381 Other malaise: Secondary | ICD-10-CM | POA: Diagnosis not present

## 2022-08-06 DIAGNOSIS — S32000A Wedge compression fracture of unspecified lumbar vertebra, initial encounter for closed fracture: Secondary | ICD-10-CM | POA: Diagnosis not present

## 2022-08-08 DIAGNOSIS — B351 Tinea unguium: Secondary | ICD-10-CM | POA: Diagnosis not present

## 2022-08-08 DIAGNOSIS — I7091 Generalized atherosclerosis: Secondary | ICD-10-CM | POA: Diagnosis not present

## 2022-08-12 DIAGNOSIS — M8000XS Age-related osteoporosis with current pathological fracture, unspecified site, sequela: Secondary | ICD-10-CM | POA: Diagnosis not present

## 2022-08-12 DIAGNOSIS — L0390 Cellulitis, unspecified: Secondary | ICD-10-CM | POA: Diagnosis not present

## 2022-08-12 DIAGNOSIS — I10 Essential (primary) hypertension: Secondary | ICD-10-CM | POA: Diagnosis not present

## 2022-08-14 DIAGNOSIS — F4322 Adjustment disorder with anxiety: Secondary | ICD-10-CM | POA: Diagnosis not present

## 2022-08-14 DIAGNOSIS — F411 Generalized anxiety disorder: Secondary | ICD-10-CM | POA: Diagnosis not present

## 2022-08-21 DIAGNOSIS — D631 Anemia in chronic kidney disease: Secondary | ICD-10-CM | POA: Diagnosis not present

## 2022-08-21 DIAGNOSIS — L97929 Non-pressure chronic ulcer of unspecified part of left lower leg with unspecified severity: Secondary | ICD-10-CM | POA: Diagnosis not present

## 2022-08-21 DIAGNOSIS — Z681 Body mass index (BMI) 19 or less, adult: Secondary | ICD-10-CM | POA: Diagnosis not present

## 2022-08-21 DIAGNOSIS — M6281 Muscle weakness (generalized): Secondary | ICD-10-CM | POA: Diagnosis not present

## 2022-08-28 DIAGNOSIS — Z681 Body mass index (BMI) 19 or less, adult: Secondary | ICD-10-CM | POA: Diagnosis not present

## 2022-08-28 DIAGNOSIS — M6281 Muscle weakness (generalized): Secondary | ICD-10-CM | POA: Diagnosis not present

## 2022-08-28 DIAGNOSIS — L97929 Non-pressure chronic ulcer of unspecified part of left lower leg with unspecified severity: Secondary | ICD-10-CM | POA: Diagnosis not present

## 2022-08-28 DIAGNOSIS — D631 Anemia in chronic kidney disease: Secondary | ICD-10-CM | POA: Diagnosis not present

## 2022-08-30 DIAGNOSIS — R1313 Dysphagia, pharyngeal phase: Secondary | ICD-10-CM | POA: Diagnosis not present

## 2022-08-30 DIAGNOSIS — S81812D Laceration without foreign body, left lower leg, subsequent encounter: Secondary | ICD-10-CM | POA: Diagnosis not present

## 2022-08-30 DIAGNOSIS — S32050D Wedge compression fracture of fifth lumbar vertebra, subsequent encounter for fracture with routine healing: Secondary | ICD-10-CM | POA: Diagnosis not present

## 2022-08-30 DIAGNOSIS — M48061 Spinal stenosis, lumbar region without neurogenic claudication: Secondary | ICD-10-CM | POA: Diagnosis not present

## 2022-08-30 DIAGNOSIS — S32040D Wedge compression fracture of fourth lumbar vertebra, subsequent encounter for fracture with routine healing: Secondary | ICD-10-CM | POA: Diagnosis not present

## 2022-08-30 DIAGNOSIS — F411 Generalized anxiety disorder: Secondary | ICD-10-CM | POA: Diagnosis not present

## 2022-08-30 DIAGNOSIS — Z961 Presence of intraocular lens: Secondary | ICD-10-CM | POA: Diagnosis not present

## 2022-08-30 DIAGNOSIS — M3500 Sicca syndrome, unspecified: Secondary | ICD-10-CM | POA: Diagnosis not present

## 2022-08-30 DIAGNOSIS — M1990 Unspecified osteoarthritis, unspecified site: Secondary | ICD-10-CM | POA: Diagnosis not present

## 2022-08-30 DIAGNOSIS — I129 Hypertensive chronic kidney disease with stage 1 through stage 4 chronic kidney disease, or unspecified chronic kidney disease: Secondary | ICD-10-CM | POA: Diagnosis not present

## 2022-08-30 DIAGNOSIS — N182 Chronic kidney disease, stage 2 (mild): Secondary | ICD-10-CM | POA: Diagnosis not present

## 2022-08-30 DIAGNOSIS — Z9181 History of falling: Secondary | ICD-10-CM | POA: Diagnosis not present

## 2022-08-30 DIAGNOSIS — D631 Anemia in chronic kidney disease: Secondary | ICD-10-CM | POA: Diagnosis not present

## 2022-08-30 DIAGNOSIS — M810 Age-related osteoporosis without current pathological fracture: Secondary | ICD-10-CM | POA: Diagnosis not present

## 2022-08-30 DIAGNOSIS — E785 Hyperlipidemia, unspecified: Secondary | ICD-10-CM | POA: Diagnosis not present

## 2022-08-30 DIAGNOSIS — K219 Gastro-esophageal reflux disease without esophagitis: Secondary | ICD-10-CM | POA: Diagnosis not present

## 2022-08-30 DIAGNOSIS — G8929 Other chronic pain: Secondary | ICD-10-CM | POA: Diagnosis not present

## 2022-08-30 DIAGNOSIS — S32020D Wedge compression fracture of second lumbar vertebra, subsequent encounter for fracture with routine healing: Secondary | ICD-10-CM | POA: Diagnosis not present

## 2022-09-03 DIAGNOSIS — M1990 Unspecified osteoarthritis, unspecified site: Secondary | ICD-10-CM | POA: Diagnosis not present

## 2022-09-03 DIAGNOSIS — Z961 Presence of intraocular lens: Secondary | ICD-10-CM | POA: Diagnosis not present

## 2022-09-03 DIAGNOSIS — I129 Hypertensive chronic kidney disease with stage 1 through stage 4 chronic kidney disease, or unspecified chronic kidney disease: Secondary | ICD-10-CM | POA: Diagnosis not present

## 2022-09-03 DIAGNOSIS — M810 Age-related osteoporosis without current pathological fracture: Secondary | ICD-10-CM | POA: Diagnosis not present

## 2022-09-03 DIAGNOSIS — M48061 Spinal stenosis, lumbar region without neurogenic claudication: Secondary | ICD-10-CM | POA: Diagnosis not present

## 2022-09-03 DIAGNOSIS — R1313 Dysphagia, pharyngeal phase: Secondary | ICD-10-CM | POA: Diagnosis not present

## 2022-09-03 DIAGNOSIS — E785 Hyperlipidemia, unspecified: Secondary | ICD-10-CM | POA: Diagnosis not present

## 2022-09-03 DIAGNOSIS — Z9181 History of falling: Secondary | ICD-10-CM | POA: Diagnosis not present

## 2022-09-03 DIAGNOSIS — M3500 Sicca syndrome, unspecified: Secondary | ICD-10-CM | POA: Diagnosis not present

## 2022-09-03 DIAGNOSIS — D631 Anemia in chronic kidney disease: Secondary | ICD-10-CM | POA: Diagnosis not present

## 2022-09-03 DIAGNOSIS — S32050D Wedge compression fracture of fifth lumbar vertebra, subsequent encounter for fracture with routine healing: Secondary | ICD-10-CM | POA: Diagnosis not present

## 2022-09-03 DIAGNOSIS — S32020D Wedge compression fracture of second lumbar vertebra, subsequent encounter for fracture with routine healing: Secondary | ICD-10-CM | POA: Diagnosis not present

## 2022-09-03 DIAGNOSIS — N182 Chronic kidney disease, stage 2 (mild): Secondary | ICD-10-CM | POA: Diagnosis not present

## 2022-09-03 DIAGNOSIS — S32040D Wedge compression fracture of fourth lumbar vertebra, subsequent encounter for fracture with routine healing: Secondary | ICD-10-CM | POA: Diagnosis not present

## 2022-09-03 DIAGNOSIS — G8929 Other chronic pain: Secondary | ICD-10-CM | POA: Diagnosis not present

## 2022-09-03 DIAGNOSIS — F411 Generalized anxiety disorder: Secondary | ICD-10-CM | POA: Diagnosis not present

## 2022-09-03 DIAGNOSIS — S81812D Laceration without foreign body, left lower leg, subsequent encounter: Secondary | ICD-10-CM | POA: Diagnosis not present

## 2022-09-03 DIAGNOSIS — K219 Gastro-esophageal reflux disease without esophagitis: Secondary | ICD-10-CM | POA: Diagnosis not present

## 2022-09-05 DIAGNOSIS — K219 Gastro-esophageal reflux disease without esophagitis: Secondary | ICD-10-CM | POA: Diagnosis not present

## 2022-09-05 DIAGNOSIS — D631 Anemia in chronic kidney disease: Secondary | ICD-10-CM | POA: Diagnosis not present

## 2022-09-05 DIAGNOSIS — R1313 Dysphagia, pharyngeal phase: Secondary | ICD-10-CM | POA: Diagnosis not present

## 2022-09-05 DIAGNOSIS — S32050D Wedge compression fracture of fifth lumbar vertebra, subsequent encounter for fracture with routine healing: Secondary | ICD-10-CM | POA: Diagnosis not present

## 2022-09-05 DIAGNOSIS — I129 Hypertensive chronic kidney disease with stage 1 through stage 4 chronic kidney disease, or unspecified chronic kidney disease: Secondary | ICD-10-CM | POA: Diagnosis not present

## 2022-09-05 DIAGNOSIS — S81812D Laceration without foreign body, left lower leg, subsequent encounter: Secondary | ICD-10-CM | POA: Diagnosis not present

## 2022-09-05 DIAGNOSIS — N1831 Chronic kidney disease, stage 3a: Secondary | ICD-10-CM | POA: Diagnosis not present

## 2022-09-05 DIAGNOSIS — M1990 Unspecified osteoarthritis, unspecified site: Secondary | ICD-10-CM | POA: Diagnosis not present

## 2022-09-05 DIAGNOSIS — I10 Essential (primary) hypertension: Secondary | ICD-10-CM | POA: Diagnosis not present

## 2022-09-05 DIAGNOSIS — G8929 Other chronic pain: Secondary | ICD-10-CM | POA: Diagnosis not present

## 2022-09-05 DIAGNOSIS — F411 Generalized anxiety disorder: Secondary | ICD-10-CM | POA: Diagnosis not present

## 2022-09-05 DIAGNOSIS — M549 Dorsalgia, unspecified: Secondary | ICD-10-CM | POA: Diagnosis not present

## 2022-09-05 DIAGNOSIS — S32040D Wedge compression fracture of fourth lumbar vertebra, subsequent encounter for fracture with routine healing: Secondary | ICD-10-CM | POA: Diagnosis not present

## 2022-09-05 DIAGNOSIS — M3500 Sicca syndrome, unspecified: Secondary | ICD-10-CM | POA: Diagnosis not present

## 2022-09-05 DIAGNOSIS — Z961 Presence of intraocular lens: Secondary | ICD-10-CM | POA: Diagnosis not present

## 2022-09-05 DIAGNOSIS — N182 Chronic kidney disease, stage 2 (mild): Secondary | ICD-10-CM | POA: Diagnosis not present

## 2022-09-05 DIAGNOSIS — Z9181 History of falling: Secondary | ICD-10-CM | POA: Diagnosis not present

## 2022-09-05 DIAGNOSIS — M810 Age-related osteoporosis without current pathological fracture: Secondary | ICD-10-CM | POA: Diagnosis not present

## 2022-09-05 DIAGNOSIS — S32020D Wedge compression fracture of second lumbar vertebra, subsequent encounter for fracture with routine healing: Secondary | ICD-10-CM | POA: Diagnosis not present

## 2022-09-05 DIAGNOSIS — S32000A Wedge compression fracture of unspecified lumbar vertebra, initial encounter for closed fracture: Secondary | ICD-10-CM | POA: Diagnosis not present

## 2022-09-05 DIAGNOSIS — M48061 Spinal stenosis, lumbar region without neurogenic claudication: Secondary | ICD-10-CM | POA: Diagnosis not present

## 2022-09-05 DIAGNOSIS — E785 Hyperlipidemia, unspecified: Secondary | ICD-10-CM | POA: Diagnosis not present

## 2022-09-09 ENCOUNTER — Other Ambulatory Visit (HOSPITAL_COMMUNITY): Payer: Self-pay | Admitting: Neuroradiology

## 2022-09-09 ENCOUNTER — Telehealth: Payer: Self-pay | Admitting: Physician Assistant

## 2022-09-09 DIAGNOSIS — N182 Chronic kidney disease, stage 2 (mild): Secondary | ICD-10-CM | POA: Diagnosis not present

## 2022-09-09 DIAGNOSIS — M48061 Spinal stenosis, lumbar region without neurogenic claudication: Secondary | ICD-10-CM | POA: Diagnosis not present

## 2022-09-09 DIAGNOSIS — I129 Hypertensive chronic kidney disease with stage 1 through stage 4 chronic kidney disease, or unspecified chronic kidney disease: Secondary | ICD-10-CM | POA: Diagnosis not present

## 2022-09-09 DIAGNOSIS — D631 Anemia in chronic kidney disease: Secondary | ICD-10-CM | POA: Diagnosis not present

## 2022-09-09 DIAGNOSIS — M810 Age-related osteoporosis without current pathological fracture: Secondary | ICD-10-CM | POA: Diagnosis not present

## 2022-09-09 DIAGNOSIS — E785 Hyperlipidemia, unspecified: Secondary | ICD-10-CM | POA: Diagnosis not present

## 2022-09-09 DIAGNOSIS — S32020D Wedge compression fracture of second lumbar vertebra, subsequent encounter for fracture with routine healing: Secondary | ICD-10-CM | POA: Diagnosis not present

## 2022-09-09 DIAGNOSIS — G8929 Other chronic pain: Secondary | ICD-10-CM | POA: Diagnosis not present

## 2022-09-09 DIAGNOSIS — M1990 Unspecified osteoarthritis, unspecified site: Secondary | ICD-10-CM | POA: Diagnosis not present

## 2022-09-09 DIAGNOSIS — F411 Generalized anxiety disorder: Secondary | ICD-10-CM | POA: Diagnosis not present

## 2022-09-09 DIAGNOSIS — Z961 Presence of intraocular lens: Secondary | ICD-10-CM | POA: Diagnosis not present

## 2022-09-09 DIAGNOSIS — S32050D Wedge compression fracture of fifth lumbar vertebra, subsequent encounter for fracture with routine healing: Secondary | ICD-10-CM | POA: Diagnosis not present

## 2022-09-09 DIAGNOSIS — Z9181 History of falling: Secondary | ICD-10-CM | POA: Diagnosis not present

## 2022-09-09 DIAGNOSIS — S32040D Wedge compression fracture of fourth lumbar vertebra, subsequent encounter for fracture with routine healing: Secondary | ICD-10-CM | POA: Diagnosis not present

## 2022-09-09 DIAGNOSIS — R1313 Dysphagia, pharyngeal phase: Secondary | ICD-10-CM | POA: Diagnosis not present

## 2022-09-09 DIAGNOSIS — Z9889 Other specified postprocedural states: Secondary | ICD-10-CM

## 2022-09-09 DIAGNOSIS — M545 Low back pain, unspecified: Secondary | ICD-10-CM

## 2022-09-09 DIAGNOSIS — S81812D Laceration without foreign body, left lower leg, subsequent encounter: Secondary | ICD-10-CM | POA: Diagnosis not present

## 2022-09-09 DIAGNOSIS — K219 Gastro-esophageal reflux disease without esophagitis: Secondary | ICD-10-CM | POA: Diagnosis not present

## 2022-09-09 DIAGNOSIS — M3500 Sicca syndrome, unspecified: Secondary | ICD-10-CM | POA: Diagnosis not present

## 2022-09-09 NOTE — Telephone Encounter (Signed)
  Patient's daughter called and stated the patient was having new severe back pain.  She is s/p KP of L2 on 06/20/22.  She had done very well after the procedure.  Patient's daughter states that the new pain started suddenly and is disabling.   She rates it a 7-8/10 constantly and a  9/10 especially when trying to move and gt up from sitting and attempting to perform ADLs. She is usually able to do some care for herself but the pain is "excruciating".  She was taking Vicodin and also taking Neurontin without any relief.  Discussed with Dr. Tommie Sams and she recommends MRI and follow up with her after.  Morrie Sheldon is aware and will schedule.  Perrin Eddleman S Usman Millett PA-C 09/09/2022 9:28 AM

## 2022-09-10 ENCOUNTER — Ambulatory Visit (HOSPITAL_COMMUNITY)
Admission: RE | Admit: 2022-09-10 | Discharge: 2022-09-10 | Disposition: A | Discharge status: Home / Self Care | Payer: Medicare HMO | Source: Ambulatory Visit | Attending: Neuroradiology | Admitting: Neuroradiology

## 2022-09-10 DIAGNOSIS — Z9889 Other specified postprocedural states: Secondary | ICD-10-CM

## 2022-09-10 DIAGNOSIS — M545 Low back pain, unspecified: Secondary | ICD-10-CM | POA: Diagnosis not present

## 2022-09-10 DIAGNOSIS — M4316 Spondylolisthesis, lumbar region: Secondary | ICD-10-CM | POA: Diagnosis not present

## 2022-09-10 DIAGNOSIS — S22000A Wedge compression fracture of unspecified thoracic vertebra, initial encounter for closed fracture: Secondary | ICD-10-CM | POA: Diagnosis not present

## 2022-09-10 DIAGNOSIS — S32040A Wedge compression fracture of fourth lumbar vertebra, initial encounter for closed fracture: Secondary | ICD-10-CM | POA: Diagnosis not present

## 2022-09-10 NOTE — Consult Note (Signed)
Chief Complaint: Patient was seen in consultation today for new onset back pain.  Supervising Physician: Baldemar Lenis  Patient Status: [facility] - Out-pt  History of Present Illness: Lauren Davidson is a 89 y.o. female with a past medical history of ALLETA Davidson is a 89 y.o. female with medical history significant for hyperlipidemia, hypertension, Sjogren's with significant dry mouth, chronic back pain, and atraumatic L4 compression fracture in May 2023. She had intractable back pain in January 2024 and was found to have a fragility compression fracture of L2, for which she underwent a kyphoplasty with significant improvement of back pain.  Unfortunately, she developed intractable back pain again after a fall approximately a month ago.  She rates the pain at least 8/10, especially when she moves.  She underwent an MRI of the lumbar spine today that revealed 2 new fractures at T12 and L1 with edema and height loss.   Past Medical History:  Diagnosis Date   Hyperlipidemia     Past Surgical History:  Procedure Laterality Date   IR KYPHO LUMBAR INC FX REDUCE BONE BX UNI/BIL CANNULATION INC/IMAGING  06/20/2022    Allergies: Statins, Buprenorphine hcl, Buspirone, Lexapro [escitalopram], Lovastatin, Meperidine, and Morphine and related  Medications: Prior to Admission medications   Medication Sig Start Date End Date Taking? Authorizing Provider  acetaminophen (TYLENOL) 500 MG tablet Take 500 mg by mouth every 6 (six) hours as needed (for pain).    [provider]  amLODipine (NORVASC) 5 MG tablet Take 1 tablet (5 mg total) by mouth daily. 06/26/22   Meredeth Ide, MD  Calcium Carb-Cholecalciferol (CALCIUM+D3 PO) Take 1 tablet by mouth daily with breakfast.    [provider]  docusate sodium (COLACE) 100 MG capsule Take 1 capsule (100 mg total) by mouth 2 (two) times daily. 09/20/21   Jerald Kief, MD  escitalopram (LEXAPRO) 10 MG tablet Take 10 mg by mouth  daily.    [provider]  fish oil-omega-3 fatty acids 1000 MG capsule Take 1,000 mg by mouth 2 (two) times daily after a meal.    [provider]  lidocaine (LIDODERM) 5 % Place 1 patch onto the skin daily. Remove & Discard patch within 12 hours or as directed by MD 06/26/22   Meredeth Ide, MD  Multiple Vitamins-Minerals (MULTIVITAMIN WITH MINERALS) tablet Take 1 tablet by mouth daily with lunch.    [provider]  Multiple Vitamins-Minerals (PRESERVISION AREDS 2) CAPS Take 1 capsule by mouth in the morning and at bedtime.    [provider]  SYSTANE OVERNIGHT THERAPY 0.3 % GEL ophthalmic ointment Place 1 application. into both eyes at bedtime.    [provider]  SYSTANE ULTRA PF 0.4-0.3 % SOLN Place 1 drop into both eyes 4 (four) times daily.    [provider]     No family history on file.  Social History   Socioeconomic History   Marital status: Divorced    Spouse name: Not on file   Number of children: Not on file   Years of education: Not on file   Highest education level: Not on file  Occupational History   Not on file  Tobacco Use   Smoking status: Never   Smokeless tobacco: Never  Vaping Use   Vaping Use: Never used  Substance and Sexual Activity   Alcohol use: Never   Drug use: Never   Sexual activity: Not on file  Other Topics Concern   Not  on file  Social History Narrative   Not on file   Social Determinants of Health   Financial Resource Strain: Not on file  Food Insecurity: No Food Insecurity (06/13/2022)   Hunger Vital Sign    Worried About Running Out of Food in the Last Year: Never true    Ran Out of Food in the Last Year: Never true  Transportation Needs: No Transportation Needs (06/13/2022)   PRAPARE - Administrator, Civil Service (Medical): No    Lack of Transportation (Non-Medical): No  Physical Activity: Not on file  Stress: Not on file  Social Connections: Not on file      Review of Systems: A 12 point ROS discussed and pertinent positives are indicated in the HPI above.  All other systems are negative.  Review of Systems  Vital Signs: There were no vitals taken for this visit.  Physical Exam Musculoskeletal:       Back:  Neurological:     General: No focal deficit present.     Mental Status: She is alert and oriented to person, place, and time. Mental status is at baseline.  Psychiatric:        Thought Content: Thought content normal.          Imaging: No results found.  Labs:  CBC: Recent Labs    06/12/22 1550 06/13/22 0559 06/16/22 0336 06/19/22 0051  WBC 9.7 8.3 6.1 6.4  HGB 13.2 12.8 12.6 12.0  HCT 38.4 36.9 37.3 34.9*  PLT 142* 133* 145* 186    COAGS: Recent Labs    06/20/22 0740  INR 1.0    BMP: Recent Labs    06/13/22 0559 06/16/22 0336 06/17/22 0327 06/19/22 0051 06/20/22 0145  NA 134* 129* 131* 133*  --   K 4.0 4.2 4.3 4.2  --   CL 100 94* 99 101  --   CO2 --   GLUCOSE 82 105* 98 101*  --   BUN --   CALCIUM 8.5* 8.5* 8.3* 8.4*  --   CREATININE 0.76 0.71 0.72 0.67 0.64  GFRNONAA >60 >60 >60 >60 >60    LIVER FUNCTION TESTS: Recent Labs    09/18/21 0431 09/20/21 0437 06/13/22 0559  BILITOT 0.9 0.6 1.5*  AST 48* 48* 38  ALT 39 38 23  ALKPHOS 86 93 68  PROT 6.7 6.6 6.2*  ALBUMIN 3.3* 3.3* 3.1*    TUMOR MARKERS: No results for input(s): "AFPTM", "CEA", "CA199", "CHROMGRNA" in the last 8760 hours.  Assessment and Plan:  I reviewed imaging findings with Mrs. Mathia and her son-in-law Jake Shark. There are 2 new acute fractures at T12 and L1. She is complaining of significant pain and would benefit from balloon kyphoplasty at these levels. She would like to proceed with intervention. We will call to schedule the procedure once we obtain insurance approval.   Electronically Signed: Baldemar Lenis, MD 09/10/2022, 4:12 PM   I spent a total of    25  Minutes in face to face in clinical consultation, greater than 50% of which was counseling/coordinating care for compression fractures of the thoracolumbar spine.

## 2022-09-11 ENCOUNTER — Other Ambulatory Visit (HOSPITAL_COMMUNITY): Payer: Self-pay | Admitting: Neuroradiology

## 2022-09-11 DIAGNOSIS — S32010A Wedge compression fracture of first lumbar vertebra, initial encounter for closed fracture: Secondary | ICD-10-CM

## 2022-09-11 DIAGNOSIS — M3500 Sicca syndrome, unspecified: Secondary | ICD-10-CM | POA: Diagnosis not present

## 2022-09-11 DIAGNOSIS — M545 Low back pain, unspecified: Secondary | ICD-10-CM

## 2022-09-11 DIAGNOSIS — R1313 Dysphagia, pharyngeal phase: Secondary | ICD-10-CM | POA: Diagnosis not present

## 2022-09-11 DIAGNOSIS — Z961 Presence of intraocular lens: Secondary | ICD-10-CM | POA: Diagnosis not present

## 2022-09-11 DIAGNOSIS — E785 Hyperlipidemia, unspecified: Secondary | ICD-10-CM | POA: Diagnosis not present

## 2022-09-11 DIAGNOSIS — S32040D Wedge compression fracture of fourth lumbar vertebra, subsequent encounter for fracture with routine healing: Secondary | ICD-10-CM | POA: Diagnosis not present

## 2022-09-11 DIAGNOSIS — F411 Generalized anxiety disorder: Secondary | ICD-10-CM | POA: Diagnosis not present

## 2022-09-11 DIAGNOSIS — M48061 Spinal stenosis, lumbar region without neurogenic claudication: Secondary | ICD-10-CM | POA: Diagnosis not present

## 2022-09-11 DIAGNOSIS — S81812D Laceration without foreign body, left lower leg, subsequent encounter: Secondary | ICD-10-CM | POA: Diagnosis not present

## 2022-09-11 DIAGNOSIS — N182 Chronic kidney disease, stage 2 (mild): Secondary | ICD-10-CM | POA: Diagnosis not present

## 2022-09-11 DIAGNOSIS — M546 Pain in thoracic spine: Secondary | ICD-10-CM

## 2022-09-11 DIAGNOSIS — M810 Age-related osteoporosis without current pathological fracture: Secondary | ICD-10-CM | POA: Diagnosis not present

## 2022-09-11 DIAGNOSIS — Z9181 History of falling: Secondary | ICD-10-CM | POA: Diagnosis not present

## 2022-09-11 DIAGNOSIS — S32020D Wedge compression fracture of second lumbar vertebra, subsequent encounter for fracture with routine healing: Secondary | ICD-10-CM | POA: Diagnosis not present

## 2022-09-11 DIAGNOSIS — I129 Hypertensive chronic kidney disease with stage 1 through stage 4 chronic kidney disease, or unspecified chronic kidney disease: Secondary | ICD-10-CM | POA: Diagnosis not present

## 2022-09-11 DIAGNOSIS — S32050D Wedge compression fracture of fifth lumbar vertebra, subsequent encounter for fracture with routine healing: Secondary | ICD-10-CM | POA: Diagnosis not present

## 2022-09-11 DIAGNOSIS — D631 Anemia in chronic kidney disease: Secondary | ICD-10-CM | POA: Diagnosis not present

## 2022-09-11 DIAGNOSIS — K219 Gastro-esophageal reflux disease without esophagitis: Secondary | ICD-10-CM | POA: Diagnosis not present

## 2022-09-11 DIAGNOSIS — S22080A Wedge compression fracture of T11-T12 vertebra, initial encounter for closed fracture: Secondary | ICD-10-CM

## 2022-09-11 DIAGNOSIS — M1990 Unspecified osteoarthritis, unspecified site: Secondary | ICD-10-CM | POA: Diagnosis not present

## 2022-09-11 DIAGNOSIS — G8929 Other chronic pain: Secondary | ICD-10-CM | POA: Diagnosis not present

## 2022-09-12 DIAGNOSIS — M1990 Unspecified osteoarthritis, unspecified site: Secondary | ICD-10-CM | POA: Diagnosis not present

## 2022-09-12 DIAGNOSIS — I129 Hypertensive chronic kidney disease with stage 1 through stage 4 chronic kidney disease, or unspecified chronic kidney disease: Secondary | ICD-10-CM | POA: Diagnosis not present

## 2022-09-12 DIAGNOSIS — F411 Generalized anxiety disorder: Secondary | ICD-10-CM | POA: Diagnosis not present

## 2022-09-12 DIAGNOSIS — N182 Chronic kidney disease, stage 2 (mild): Secondary | ICD-10-CM | POA: Diagnosis not present

## 2022-09-12 DIAGNOSIS — S81812D Laceration without foreign body, left lower leg, subsequent encounter: Secondary | ICD-10-CM | POA: Diagnosis not present

## 2022-09-12 DIAGNOSIS — S32050D Wedge compression fracture of fifth lumbar vertebra, subsequent encounter for fracture with routine healing: Secondary | ICD-10-CM | POA: Diagnosis not present

## 2022-09-12 DIAGNOSIS — Z961 Presence of intraocular lens: Secondary | ICD-10-CM | POA: Diagnosis not present

## 2022-09-12 DIAGNOSIS — R1313 Dysphagia, pharyngeal phase: Secondary | ICD-10-CM | POA: Diagnosis not present

## 2022-09-12 DIAGNOSIS — M810 Age-related osteoporosis without current pathological fracture: Secondary | ICD-10-CM | POA: Diagnosis not present

## 2022-09-12 DIAGNOSIS — K219 Gastro-esophageal reflux disease without esophagitis: Secondary | ICD-10-CM | POA: Diagnosis not present

## 2022-09-12 DIAGNOSIS — S32020D Wedge compression fracture of second lumbar vertebra, subsequent encounter for fracture with routine healing: Secondary | ICD-10-CM | POA: Diagnosis not present

## 2022-09-12 DIAGNOSIS — S32040D Wedge compression fracture of fourth lumbar vertebra, subsequent encounter for fracture with routine healing: Secondary | ICD-10-CM | POA: Diagnosis not present

## 2022-09-12 DIAGNOSIS — M48061 Spinal stenosis, lumbar region without neurogenic claudication: Secondary | ICD-10-CM | POA: Diagnosis not present

## 2022-09-12 DIAGNOSIS — M3500 Sicca syndrome, unspecified: Secondary | ICD-10-CM | POA: Diagnosis not present

## 2022-09-12 DIAGNOSIS — Z9181 History of falling: Secondary | ICD-10-CM | POA: Diagnosis not present

## 2022-09-12 DIAGNOSIS — E785 Hyperlipidemia, unspecified: Secondary | ICD-10-CM | POA: Diagnosis not present

## 2022-09-12 DIAGNOSIS — G8929 Other chronic pain: Secondary | ICD-10-CM | POA: Diagnosis not present

## 2022-09-12 DIAGNOSIS — D631 Anemia in chronic kidney disease: Secondary | ICD-10-CM | POA: Diagnosis not present

## 2022-09-17 ENCOUNTER — Telehealth (HOSPITAL_COMMUNITY): Payer: Self-pay

## 2022-09-17 NOTE — Telephone Encounter (Signed)
Called family to let them know that we are still waiting on insurance approval for KP. I will call them as soon as I get approval. AB

## 2022-09-18 DIAGNOSIS — D631 Anemia in chronic kidney disease: Secondary | ICD-10-CM | POA: Diagnosis not present

## 2022-09-18 DIAGNOSIS — M810 Age-related osteoporosis without current pathological fracture: Secondary | ICD-10-CM | POA: Diagnosis not present

## 2022-09-18 DIAGNOSIS — S32020D Wedge compression fracture of second lumbar vertebra, subsequent encounter for fracture with routine healing: Secondary | ICD-10-CM | POA: Diagnosis not present

## 2022-09-18 DIAGNOSIS — S81812D Laceration without foreign body, left lower leg, subsequent encounter: Secondary | ICD-10-CM | POA: Diagnosis not present

## 2022-09-18 DIAGNOSIS — E785 Hyperlipidemia, unspecified: Secondary | ICD-10-CM | POA: Diagnosis not present

## 2022-09-18 DIAGNOSIS — S32040D Wedge compression fracture of fourth lumbar vertebra, subsequent encounter for fracture with routine healing: Secondary | ICD-10-CM | POA: Diagnosis not present

## 2022-09-18 DIAGNOSIS — M48061 Spinal stenosis, lumbar region without neurogenic claudication: Secondary | ICD-10-CM | POA: Diagnosis not present

## 2022-09-18 DIAGNOSIS — F411 Generalized anxiety disorder: Secondary | ICD-10-CM | POA: Diagnosis not present

## 2022-09-18 DIAGNOSIS — G8929 Other chronic pain: Secondary | ICD-10-CM | POA: Diagnosis not present

## 2022-09-18 DIAGNOSIS — R1313 Dysphagia, pharyngeal phase: Secondary | ICD-10-CM | POA: Diagnosis not present

## 2022-09-18 DIAGNOSIS — S32050D Wedge compression fracture of fifth lumbar vertebra, subsequent encounter for fracture with routine healing: Secondary | ICD-10-CM | POA: Diagnosis not present

## 2022-09-18 DIAGNOSIS — I129 Hypertensive chronic kidney disease with stage 1 through stage 4 chronic kidney disease, or unspecified chronic kidney disease: Secondary | ICD-10-CM | POA: Diagnosis not present

## 2022-09-18 DIAGNOSIS — M3500 Sicca syndrome, unspecified: Secondary | ICD-10-CM | POA: Diagnosis not present

## 2022-09-18 DIAGNOSIS — K219 Gastro-esophageal reflux disease without esophagitis: Secondary | ICD-10-CM | POA: Diagnosis not present

## 2022-09-18 DIAGNOSIS — Z9181 History of falling: Secondary | ICD-10-CM | POA: Diagnosis not present

## 2022-09-18 DIAGNOSIS — N182 Chronic kidney disease, stage 2 (mild): Secondary | ICD-10-CM | POA: Diagnosis not present

## 2022-09-18 DIAGNOSIS — M1990 Unspecified osteoarthritis, unspecified site: Secondary | ICD-10-CM | POA: Diagnosis not present

## 2022-09-18 DIAGNOSIS — Z961 Presence of intraocular lens: Secondary | ICD-10-CM | POA: Diagnosis not present

## 2022-09-19 DIAGNOSIS — S32050D Wedge compression fracture of fifth lumbar vertebra, subsequent encounter for fracture with routine healing: Secondary | ICD-10-CM | POA: Diagnosis not present

## 2022-09-19 DIAGNOSIS — S32040D Wedge compression fracture of fourth lumbar vertebra, subsequent encounter for fracture with routine healing: Secondary | ICD-10-CM | POA: Diagnosis not present

## 2022-09-19 DIAGNOSIS — S32020D Wedge compression fracture of second lumbar vertebra, subsequent encounter for fracture with routine healing: Secondary | ICD-10-CM | POA: Diagnosis not present

## 2022-09-19 DIAGNOSIS — I129 Hypertensive chronic kidney disease with stage 1 through stage 4 chronic kidney disease, or unspecified chronic kidney disease: Secondary | ICD-10-CM | POA: Diagnosis not present

## 2022-09-19 DIAGNOSIS — M48061 Spinal stenosis, lumbar region without neurogenic claudication: Secondary | ICD-10-CM | POA: Diagnosis not present

## 2022-09-19 DIAGNOSIS — G8929 Other chronic pain: Secondary | ICD-10-CM | POA: Diagnosis not present

## 2022-09-19 DIAGNOSIS — M1990 Unspecified osteoarthritis, unspecified site: Secondary | ICD-10-CM | POA: Diagnosis not present

## 2022-09-19 DIAGNOSIS — D631 Anemia in chronic kidney disease: Secondary | ICD-10-CM | POA: Diagnosis not present

## 2022-09-19 DIAGNOSIS — K219 Gastro-esophageal reflux disease without esophagitis: Secondary | ICD-10-CM | POA: Diagnosis not present

## 2022-09-19 DIAGNOSIS — M810 Age-related osteoporosis without current pathological fracture: Secondary | ICD-10-CM | POA: Diagnosis not present

## 2022-09-19 DIAGNOSIS — M3500 Sicca syndrome, unspecified: Secondary | ICD-10-CM | POA: Diagnosis not present

## 2022-09-19 DIAGNOSIS — E785 Hyperlipidemia, unspecified: Secondary | ICD-10-CM | POA: Diagnosis not present

## 2022-09-19 DIAGNOSIS — F411 Generalized anxiety disorder: Secondary | ICD-10-CM | POA: Diagnosis not present

## 2022-09-19 DIAGNOSIS — N182 Chronic kidney disease, stage 2 (mild): Secondary | ICD-10-CM | POA: Diagnosis not present

## 2022-09-19 DIAGNOSIS — R1313 Dysphagia, pharyngeal phase: Secondary | ICD-10-CM | POA: Diagnosis not present

## 2022-09-19 DIAGNOSIS — Z9181 History of falling: Secondary | ICD-10-CM | POA: Diagnosis not present

## 2022-09-19 DIAGNOSIS — Z961 Presence of intraocular lens: Secondary | ICD-10-CM | POA: Diagnosis not present

## 2022-09-19 DIAGNOSIS — S81812D Laceration without foreign body, left lower leg, subsequent encounter: Secondary | ICD-10-CM | POA: Diagnosis not present

## 2022-09-25 ENCOUNTER — Other Ambulatory Visit: Payer: Self-pay | Admitting: Radiology

## 2022-09-25 DIAGNOSIS — K219 Gastro-esophageal reflux disease without esophagitis: Secondary | ICD-10-CM | POA: Diagnosis not present

## 2022-09-25 DIAGNOSIS — D631 Anemia in chronic kidney disease: Secondary | ICD-10-CM | POA: Diagnosis not present

## 2022-09-25 DIAGNOSIS — Z961 Presence of intraocular lens: Secondary | ICD-10-CM | POA: Diagnosis not present

## 2022-09-25 DIAGNOSIS — S81812D Laceration without foreign body, left lower leg, subsequent encounter: Secondary | ICD-10-CM | POA: Diagnosis not present

## 2022-09-25 DIAGNOSIS — I129 Hypertensive chronic kidney disease with stage 1 through stage 4 chronic kidney disease, or unspecified chronic kidney disease: Secondary | ICD-10-CM | POA: Diagnosis not present

## 2022-09-25 DIAGNOSIS — R1313 Dysphagia, pharyngeal phase: Secondary | ICD-10-CM | POA: Diagnosis not present

## 2022-09-25 DIAGNOSIS — M549 Dorsalgia, unspecified: Secondary | ICD-10-CM

## 2022-09-25 DIAGNOSIS — M48061 Spinal stenosis, lumbar region without neurogenic claudication: Secondary | ICD-10-CM | POA: Diagnosis not present

## 2022-09-25 DIAGNOSIS — G8929 Other chronic pain: Secondary | ICD-10-CM | POA: Diagnosis not present

## 2022-09-25 DIAGNOSIS — F411 Generalized anxiety disorder: Secondary | ICD-10-CM | POA: Diagnosis not present

## 2022-09-25 DIAGNOSIS — Z9181 History of falling: Secondary | ICD-10-CM | POA: Diagnosis not present

## 2022-09-25 DIAGNOSIS — M1990 Unspecified osteoarthritis, unspecified site: Secondary | ICD-10-CM | POA: Diagnosis not present

## 2022-09-25 DIAGNOSIS — S32050D Wedge compression fracture of fifth lumbar vertebra, subsequent encounter for fracture with routine healing: Secondary | ICD-10-CM | POA: Diagnosis not present

## 2022-09-25 DIAGNOSIS — S32040D Wedge compression fracture of fourth lumbar vertebra, subsequent encounter for fracture with routine healing: Secondary | ICD-10-CM | POA: Diagnosis not present

## 2022-09-25 DIAGNOSIS — M810 Age-related osteoporosis without current pathological fracture: Secondary | ICD-10-CM | POA: Diagnosis not present

## 2022-09-25 DIAGNOSIS — M3500 Sicca syndrome, unspecified: Secondary | ICD-10-CM | POA: Diagnosis not present

## 2022-09-25 DIAGNOSIS — N182 Chronic kidney disease, stage 2 (mild): Secondary | ICD-10-CM | POA: Diagnosis not present

## 2022-09-25 DIAGNOSIS — E785 Hyperlipidemia, unspecified: Secondary | ICD-10-CM | POA: Diagnosis not present

## 2022-09-25 DIAGNOSIS — S32020D Wedge compression fracture of second lumbar vertebra, subsequent encounter for fracture with routine healing: Secondary | ICD-10-CM | POA: Diagnosis not present

## 2022-09-26 ENCOUNTER — Other Ambulatory Visit: Payer: Self-pay | Admitting: Student

## 2022-09-26 ENCOUNTER — Observation Stay (HOSPITAL_COMMUNITY)
Admission: AD | Admit: 2022-09-26 | Discharge: 2022-09-27 | Disposition: A | Discharge status: Home / Self Care | Payer: Medicare HMO | Source: Ambulatory Visit | Attending: Neuroradiology | Admitting: Neuroradiology

## 2022-09-26 DIAGNOSIS — M3500 Sicca syndrome, unspecified: Secondary | ICD-10-CM | POA: Insufficient documentation

## 2022-09-26 DIAGNOSIS — M549 Dorsalgia, unspecified: Secondary | ICD-10-CM

## 2022-09-26 DIAGNOSIS — M8008XA Age-related osteoporosis with current pathological fracture, vertebra(e), initial encounter for fracture: Secondary | ICD-10-CM | POA: Diagnosis not present

## 2022-09-26 DIAGNOSIS — I10 Essential (primary) hypertension: Secondary | ICD-10-CM | POA: Insufficient documentation

## 2022-09-26 DIAGNOSIS — M546 Pain in thoracic spine: Secondary | ICD-10-CM | POA: Insufficient documentation

## 2022-09-26 DIAGNOSIS — S32018A Other fracture of first lumbar vertebra, initial encounter for closed fracture: Secondary | ICD-10-CM | POA: Insufficient documentation

## 2022-09-26 DIAGNOSIS — R262 Difficulty in walking, not elsewhere classified: Secondary | ICD-10-CM | POA: Insufficient documentation

## 2022-09-26 DIAGNOSIS — S22000D Wedge compression fracture of unspecified thoracic vertebra, subsequent encounter for fracture with routine healing: Secondary | ICD-10-CM | POA: Diagnosis not present

## 2022-09-26 DIAGNOSIS — S22080A Wedge compression fracture of T11-T12 vertebra, initial encounter for closed fracture: Secondary | ICD-10-CM

## 2022-09-26 DIAGNOSIS — S22088A Other fracture of T11-T12 vertebra, initial encounter for closed fracture: Secondary | ICD-10-CM | POA: Diagnosis present

## 2022-09-26 DIAGNOSIS — W19XXXA Unspecified fall, initial encounter: Secondary | ICD-10-CM | POA: Diagnosis not present

## 2022-09-26 DIAGNOSIS — Z79899 Other long term (current) drug therapy: Secondary | ICD-10-CM | POA: Diagnosis not present

## 2022-09-26 DIAGNOSIS — M545 Low back pain, unspecified: Secondary | ICD-10-CM | POA: Insufficient documentation

## 2022-09-26 DIAGNOSIS — E785 Hyperlipidemia, unspecified: Secondary | ICD-10-CM | POA: Diagnosis not present

## 2022-09-26 DIAGNOSIS — S32020A Wedge compression fracture of second lumbar vertebra, initial encounter for closed fracture: Secondary | ICD-10-CM

## 2022-09-26 DIAGNOSIS — S32010A Wedge compression fracture of first lumbar vertebra, initial encounter for closed fracture: Secondary | ICD-10-CM

## 2022-09-26 HISTORY — PX: IR KYPHO LUMBAR INC FX REDUCE BONE BX UNI/BIL CANNULATION INC/IMAGING: IMG5519

## 2022-09-26 HISTORY — PX: IR KYPHO THORACIC WITH BONE BIOPSY: IMG5518

## 2022-09-26 LAB — BASIC METABOLIC PANEL
Anion gap: 10 (ref 5–15)
BUN: 22 mg/dL (ref 8–23)
CO2: 24 mmol/L (ref 22–32)
Calcium: 9 mg/dL (ref 8.9–10.3)
Chloride: 104 mmol/L (ref 98–111)
Creatinine, Ser: 0.78 mg/dL (ref 0.44–1.00)
GFR, Estimated: >60 mL/min (ref >60)
Glucose, Bld: 100 mg/dL — ABNORMAL HIGH (ref 70–99)
Potassium: 3.9 mmol/L (ref 3.5–5.1)
Sodium: 138 mmol/L (ref 135–145)

## 2022-09-26 LAB — CBC
HCT: 36.7 % (ref 36.0–46.0)
Hemoglobin: 12.4 g/dL (ref 12.0–15.0)
MCH: 34.2 pg — ABNORMAL HIGH (ref 26.0–34.0)
MCHC: 33.8 g/dL (ref 30.0–36.0)
MCV: 101.1 fL — ABNORMAL HIGH (ref 80.0–100.0)
Platelets: 210 10*3/uL (ref 150–400)
RBC: 3.63 MIL/uL — ABNORMAL LOW (ref 3.87–5.11)
RDW: 15.3 % (ref 11.5–15.5)
WBC: 4.4 10*3/uL (ref 4.0–10.5)
nRBC: 0 % (ref 0.0–0.2)

## 2022-09-26 LAB — PROTIME-INR
INR: 1 (ref 0.8–1.2)
Prothrombin Time: 12.9 seconds (ref 11.4–15.2)

## 2022-09-26 MED ORDER — SODIUM CHLORIDE 0.9 % IV SOLN: INTRAVENOUS | Status: DC

## 2022-09-26 MED ORDER — FENTANYL CITRATE (PF) 100 MCG/2ML IJ SOLN
INTRAMUSCULAR | Status: AC | PRN
  Administered 2022-09-26 (×4): 25 ug via INTRAVENOUS

## 2022-09-26 MED ORDER — LIDOCAINE HCL (PF) 1 % IJ SOLN
INTRAMUSCULAR | Status: AC
  Filled 2022-09-26: qty 30

## 2022-09-26 MED ORDER — IOHEXOL 300 MG/ML  SOLN: 50.0000 mL | Freq: Once | INTRAMUSCULAR | Status: DC | PRN

## 2022-09-26 MED ORDER — BUPIVACAINE HCL (PF) 0.25 % IJ SOLN
INTRAMUSCULAR | Status: AC
  Filled 2022-09-26: qty 30

## 2022-09-26 MED ORDER — ACETAMINOPHEN 500 MG PO TABS: 500.0000 mg | ORAL_TABLET | Freq: Four times a day (QID) | ORAL | Status: DC | PRN

## 2022-09-26 MED ORDER — CEFAZOLIN SODIUM-DEXTROSE 2-4 GM/100ML-% IV SOLN
INTRAVENOUS | Status: AC | PRN
  Administered 2022-09-26: 2 g via INTRAVENOUS

## 2022-09-26 MED ORDER — CEFAZOLIN SODIUM-DEXTROSE 2-4 GM/100ML-% IV SOLN: 2.0000 g | INTRAVENOUS | Status: AC

## 2022-09-26 MED ORDER — MIDAZOLAM HCL 2 MG/2ML IJ SOLN
INTRAMUSCULAR | Status: AC | PRN
  Administered 2022-09-26 (×3): .5 mg via INTRAVENOUS
  Administered 2022-09-26: 1 mg via INTRAVENOUS

## 2022-09-26 MED ORDER — CEFAZOLIN SODIUM-DEXTROSE 2-4 GM/100ML-% IV SOLN
INTRAVENOUS | Status: AC
  Filled 2022-09-26: qty 100

## 2022-09-26 MED ORDER — FENTANYL CITRATE (PF) 100 MCG/2ML IJ SOLN
INTRAMUSCULAR | Status: AC
  Filled 2022-09-26: qty 2

## 2022-09-26 MED ORDER — OXYCODONE-ACETAMINOPHEN 5-325 MG PO TABS: 1.0000 | ORAL_TABLET | Freq: Four times a day (QID) | ORAL | Status: DC | PRN

## 2022-09-26 MED ORDER — MIDAZOLAM HCL 2 MG/2ML IJ SOLN
INTRAMUSCULAR | Status: AC
  Filled 2022-09-26: qty 2

## 2022-09-26 NOTE — Progress Notes (Signed)
Spoke with [name] (son-in-law) - made him aware that patient needs responsible adult to be with her for 24 hours. He stated she will have caregiver that comes in and he or his wife will stay night .

## 2022-09-26 NOTE — Sedation Documentation (Signed)
Pt hypotensive but alert. 250cc bolus given.

## 2022-09-26 NOTE — H&P (Signed)
Chief Complaint: Patient was seen in consultation today for severe back pain; T12 and L1 kyphoplasty at the request of   Referring Physician(s): de Marcial Pacas Rodrigues,Katyucia  Supervising Physician: Baldemar Lenis  Patient Status: [facility] - Out-pt  History of Present Illness: Lauren Davidson is a 89 y.o. female   Hx HTN; HLD; Sjogren's Known to NIR - post L2 KP 09/2021 Larey Seat ~1 mo ago--- new intractable pain-- meds no relief Was seen in consultation 4/23 with Dr Quay Burow  I reviewed imaging findings with Lauren Davidson and her son-in-law Jake Shark. There are 2 new acute fractures at T12 and L1. She is complaining of significant pain and would benefit from balloon kyphoplasty at these levels. She would like to proceed with intervention. We will call to schedule the procedure once we obtain insurance approval.   MRI 4/23: IMPRESSION: 1. Interval spinal augmentation at L2 with slightly progressive loss of vertebral body height and bone marrow edema. 2. Interval acute/subacute compression fractures at T12 and L1 with associated bone marrow edema and mild osseous retropulsion, as described. 3. Stable severe chronic L4 compression deformity. 4. No evidence of discitis or osteomyelitis. 5. Stable multilevel spondylosis, most advanced at L3-4 where there is moderate to severe multifactorial spinal stenosis and moderate foraminal narrowing bilaterally.   Scheduled for T12 and L1 Kyphoplasty today   Past Medical History:  Diagnosis Date   Hyperlipidemia     Past Surgical History:  Procedure Laterality Date   IR KYPHO LUMBAR INC FX REDUCE BONE BX UNI/BIL CANNULATION INC/IMAGING  06/20/2022    Allergies: Statins, Buprenorphine hcl, Buspirone, Lexapro [escitalopram], Lovastatin, Meperidine, and Morphine and related  Medications: Prior to Admission medications   Medication Sig Start Date End Date Taking? Authorizing Provider  acetaminophen (TYLENOL) 500 MG tablet Take 500  mg by mouth every 6 (six) hours as needed (for pain).   Yes [provider]  amLODipine (NORVASC) 5 MG tablet Take 1 tablet (5 mg total) by mouth daily. 06/26/22  Yes Meredeth Ide, MD  Calcium Carb-Cholecalciferol (CALCIUM+D3 PO) Take 1 tablet by mouth daily with breakfast.   Yes [provider]  docusate sodium (COLACE) 100 MG capsule Take 1 capsule (100 mg total) by mouth 2 (two) times daily. 09/20/21  Yes Jerald Kief, MD  fish oil-omega-3 fatty acids 1000 MG capsule Take 1,000 mg by mouth 2 (two) times daily after a meal.   Yes [provider]  Multiple Vitamins-Minerals (MULTIVITAMIN WITH MINERALS) tablet Take 1 tablet by mouth daily with lunch.   Yes [provider]  Multiple Vitamins-Minerals (PRESERVISION AREDS 2) CAPS Take 1 capsule by mouth in the morning and at bedtime.   Yes [provider]  SYSTANE OVERNIGHT THERAPY 0.3 % GEL ophthalmic ointment Place 1 application. into both eyes at bedtime.   Yes [provider]  SYSTANE ULTRA PF 0.4-0.3 % SOLN Place 1 drop into both eyes 4 (four) times daily.   Yes [provider]  escitalopram (LEXAPRO) 10 MG tablet Take 10 mg by mouth daily.    [provider]  lidocaine (LIDODERM) 5 % Place 1 patch onto the skin daily. Remove & Discard patch within 12 hours or as directed by MD 06/26/22   Meredeth Ide, MD     No family history on file.  Social History   Socioeconomic History   Marital status: Divorced    Spouse name: Not on file   Number of children: Not on file   Years of  education: Not on file   Highest education level: Not on file  Occupational History   Not on file  Tobacco Use   Smoking status: Never   Smokeless tobacco: Never  Vaping Use   Vaping Use: Never used  Substance and Sexual Activity   Alcohol use: Never   Drug use: Never   Sexual activity: Not on file  Other Topics Concern   Not on file  Social History Narrative   Not on file   Social  Determinants of Health   Financial Resource Strain: Not on file  Food Insecurity: No Food Insecurity (06/13/2022)   Hunger Vital Sign    Worried About Running Out of Food in the Last Year: Never true    Ran Out of Food in the Last Year: Never true  Transportation Needs: No Transportation Needs (06/13/2022)   PRAPARE - Administrator, Civil Service (Medical): No    Lack of Transportation (Non-Medical): No  Physical Activity: Not on file  Stress: Not on file  Social Connections: Not on file    Review of Systems: A 12 point ROS discussed and pertinent positives are indicated in the HPI above.  All other systems are negative.  Review of Systems  Constitutional:  Positive for activity change. Negative for fatigue and fever.  Respiratory:  Negative for cough and shortness of breath.   Cardiovascular:  Negative for chest pain.  Gastrointestinal:  Negative for abdominal pain and nausea.  Musculoskeletal:  Positive for back pain and gait problem.  Neurological:  Positive for weakness.  Psychiatric/Behavioral:  Negative for behavioral problems and confusion.     Vital Signs: BP (!) 143/66   Pulse 73   Temp 98.6 F (37 C) (Oral)   Resp 14   Ht 5\' 4"  (1.626 m)   Wt 119 lb (54 kg)   SpO2 94%   BMI 20.43 kg/m     Physical Exam Vitals reviewed.  Constitutional:      Comments: Frail  HENT:     Mouth/Throat:     Mouth: Mucous membranes are moist.  Cardiovascular:     Rate and Rhythm: Normal rate and regular rhythm.     Heart sounds: Murmur heard.  Abdominal:     Palpations: Abdomen is soft.  Musculoskeletal:        General: Normal range of motion.  Skin:    General: Skin is warm.  Neurological:     Mental Status: She is alert and oriented to person, place, and time.  Psychiatric:        Behavior: Behavior normal.     Imaging: MR LUMBAR SPINE WO CONTRAST  Result Date: 09/11/2022 CLINICAL DATA:  Back pain post L2 kyphoplasty 06/20/2022. EXAM: MRI LUMBAR SPINE  WITHOUT CONTRAST TECHNIQUE: Multiplanar, multisequence MR imaging of the lumbar spine was performed. No intravenous contrast was administered. COMPARISON:  Radiographs 07/18/2022, MRI 06/13/2022 and CT 06/13/2023. FINDINGS: Segmentation: Conventional anatomy assumed, with the last open disc space designated L5-S1.Concordant with prior imaging. Alignment: Stable degenerative grade 1 anterolisthesis at L4-5 and mild convex left scoliosis. Vertebrae: Interval spinal augmentation at L2 with slightly progressive loss of vertebral body height, approximately 25%. There is mild osseous retropulsion. There is bone marrow edema throughout the L2 vertebral body. There are interval compression fractures at T12 and L1. Both of these fractures are associated with extensive bone marrow edema and mild osseous retropulsion. There is approximately 50% loss of vertebral body height at L1 and approximately 40% loss of vertebral body height  at T12. This severe L4 compression fractures unchanged from the previous MRI, with mild residual marrow edema and osseous retropulsion. Stable superior endplate degenerative changes at L5. No evidence of discitis or osteomyelitis. Mild sacroiliac degenerative changes bilaterally. Conus medullaris: Extends to the L1 level and appears normal. Paraspinal and other soft tissues: No significant paraspinal findings. Patient has developed a small right pleural effusion. Disc levels: Sagittal images demonstrate no significant disc space findings within the visualized lower thoracic spine. L1-2: Preserved disc height with mild disc bulging and a chronic left foraminal disc protrusion. Stable mild left-sided foraminal and lateral recess narrowing. L2-3: Preserved disc height with stable disc bulging eccentric to the left, mild facet and ligamentous hypertrophy. Stable mild spinal stenosis with mild left foraminal narrowing. L3-4: Unchanged moderate to severe multifactorial spinal stenosis with moderate  foraminal narrowing bilaterally. There is moderate facet and ligamentous hypertrophy with diffuse disc bulging and uncovering. L4-5: Unchanged mild multifactorial spinal stenosis with asymmetric right lateral recess and right foraminal narrowing. L5-S1: Disc height and hydration are maintained. Mild bilateral facet hypertrophy. No spinal stenosis or nerve root encroachment. IMPRESSION: 1. Interval spinal augmentation at L2 with slightly progressive loss of vertebral body height and bone marrow edema. 2. Interval acute/subacute compression fractures at T12 and L1 with associated bone marrow edema and mild osseous retropulsion, as described. 3. Stable severe chronic L4 compression deformity. 4. No evidence of discitis or osteomyelitis. 5. Stable multilevel spondylosis, most advanced at L3-4 where there is moderate to severe multifactorial spinal stenosis and moderate foraminal narrowing bilaterally. Electronically Signed   By: Carey Bullocks M.D.   On: 09/11/2022 13:33    Labs:  CBC: Recent Labs    06/13/22 0559 06/16/22 0336 06/19/22 0051 09/26/22 0705  WBC 8.3 6.1 6.4 4.4  HGB 12.8 12.6 12.0 12.4  HCT 36.9 37.3 34.9* 36.7  PLT 133* 145* 186 210    COAGS: Recent Labs    06/20/22 0740 09/26/22 0705  INR 1.0 1.0    BMP: Recent Labs    06/16/22 0336 06/17/22 0327 06/19/22 0051 06/20/22 0145 09/26/22 0705  NA 129* 131* 133*  --  138  K 4.2 4.3 4.2  --  3.9  CL 94* 99 101  --  104  CO2 27 23 25   --  24  GLUCOSE 105* 98 101*  --  100*  BUN 11 13 14   --  22  CALCIUM 8.5* 8.3* 8.4*  --  9.0  CREATININE 0.71 0.72 0.67 0.64 0.78  GFRNONAA >60 >60 >60 >60 >60    LIVER FUNCTION TESTS: Recent Labs    06/13/22 0559  BILITOT 1.5*  AST 38  ALT 23  ALKPHOS 68  PROT 6.2*  ALBUMIN 3.1*    TUMOR MARKERS: No results for input(s): "AFPTM", "CEA", "CA199", "CHROMGRNA" in the last 8760 hours.  Assessment and Plan:  Scheduled today for T12-L1 Kyphoplasty in IR Pt to be admitted  for 24 hour observation--- to be DC tomorrow am  Risks and benefits of Thoracic 12 and Lumbar 1 Kyphoplasty were discussed with the patient including, but not limited to education regarding the natural healing process of compression fractures without intervention, bleeding, infection, cement migration which may cause spinal cord damage, paralysis, pulmonary embolism or even death.  This interventional procedure involves the use of X-rays and because of the nature of the planned procedure, it is possible that we will have prolonged use of X-ray fluoroscopy.  Potential radiation risks to you include (but are not limited to)  the following: - A slightly elevated risk for cancer  several years later in life. This risk is typically less than 0.5% percent. This risk is low in comparison to the normal incidence of human cancer, which is 33% for women and 50% for men according to the [employer]. - Radiation induced injury can include skin redness, resembling a rash, tissue breakdown / ulcers and hair loss (which can be temporary or permanent).   The likelihood of either of these occurring depends on the difficulty of the procedure and whether you are sensitive to radiation due to previous procedures, disease, or genetic conditions.   IF your procedure requires a prolonged use of radiation, you will be notified and given written instructions for further action.  It is your responsibility to monitor the irradiated area for the 2 weeks following the procedure and to notify your physician if you are concerned that you have suffered a radiation induced injury.    All of the patient's questions were answered, patient is agreeable to proceed.  Consent signed and in chart.  Thank you for this interesting consult.  I greatly enjoyed meeting KENLI WINNING and look forward to participating in their care.  A copy of this report was sent to the requesting provider on this date.  Electronically  Signed: Robet Leu, PA-C 09/26/2022, 8:47 AM   I spent a total of    25 Minutes in face to face in clinical consultation, greater than 50% of which was counseling/coordinating care for T12/L1 Kyphoplasty

## 2022-09-26 NOTE — Procedures (Signed)
INTERVENTIONAL NEURORADIOLOGY BRIEF POSTPROCEDURE NOTE  FLUOROSCOPY GUIDED T12 AND L1 BALLOON KYPHOPLASTY  Attending: Dr. Baldemar Lenis  Diagnosis: T12 and L1 osteoporotic fractures  Access site: Percutaneous  Anesthesia: Moderate sedation  Medication used: 2.5 mg Versed IV; 100 mcg Fentanyl IV.  Complications: None.  Estimated blood loss: None.  Specimen: None.  Findings: Compression fractures of the T12 and L1 vertebral bodies. Balloon kyphoplasty performed with bilateral transpedicular approach.  The patient tolerated the procedure well without incident or complication and is in stable condition.   She will be admitted for overnight observation.

## 2022-09-27 DIAGNOSIS — I10 Essential (primary) hypertension: Secondary | ICD-10-CM | POA: Diagnosis not present

## 2022-09-27 DIAGNOSIS — R262 Difficulty in walking, not elsewhere classified: Secondary | ICD-10-CM | POA: Diagnosis not present

## 2022-09-27 DIAGNOSIS — M8008XA Age-related osteoporosis with current pathological fracture, vertebra(e), initial encounter for fracture: Secondary | ICD-10-CM | POA: Diagnosis not present

## 2022-09-27 DIAGNOSIS — Z79899 Other long term (current) drug therapy: Secondary | ICD-10-CM | POA: Diagnosis not present

## 2022-09-27 DIAGNOSIS — W19XXXA Unspecified fall, initial encounter: Secondary | ICD-10-CM | POA: Diagnosis not present

## 2022-09-27 DIAGNOSIS — M3500 Sicca syndrome, unspecified: Secondary | ICD-10-CM | POA: Diagnosis not present

## 2022-09-27 DIAGNOSIS — E785 Hyperlipidemia, unspecified: Secondary | ICD-10-CM | POA: Diagnosis not present

## 2022-09-27 DIAGNOSIS — S32018A Other fracture of first lumbar vertebra, initial encounter for closed fracture: Secondary | ICD-10-CM | POA: Diagnosis not present

## 2022-09-27 DIAGNOSIS — M546 Pain in thoracic spine: Secondary | ICD-10-CM | POA: Diagnosis not present

## 2022-09-27 DIAGNOSIS — M545 Low back pain, unspecified: Secondary | ICD-10-CM | POA: Diagnosis not present

## 2022-09-27 DIAGNOSIS — S22088A Other fracture of T11-T12 vertebra, initial encounter for closed fracture: Secondary | ICD-10-CM | POA: Diagnosis not present

## 2022-09-27 MED ORDER — BISACODYL 10 MG RE SUPP
10.0000 mg | Freq: Once | RECTAL | Status: AC
  Administered 2022-09-27: 10 mg via RECTAL
  Filled 2022-09-27: qty 1

## 2022-09-27 NOTE — Discharge Summary (Signed)
Patient ID: Lauren Davidson MRN: [id number] DOB/AGE: [id number] 87 y.o.  Admit date: 09/26/2022 Discharge date: 09/27/2022  Supervising Physician: Baldemar Lenis  Patient Status: [facility] - In-pt  Admission Diagnoses: T12 and L1 vertebral compression fractures  Discharge Diagnoses:  Active Problems:   * No active hospital problems. *   Discharged Condition: good  Hospital Course: Patient presented to [facility] for scheduled outpatient T12 and L1 vertebral kyphoplasty on 09/26/22. Upon presentation to the hospital, patient did not have 24 hour post-procedural supervision arranged, so decision was made to place the patient under observation overnight. Patient underwent a successful procedure with Dr Tommi Rumps Melchor Amour and has tolerated the procedure well. On exam today she states she is not in any pain and is eager to return home.    Consults: None  Significant Diagnostic Studies: labs:  Results for orders placed or performed during the hospital encounter of 09/26/22 (from the past 48 hour(s))  CBC     Status: Abnormal   Collection Time: 09/26/22  7:05 AM  Result Value Ref Range   WBC 4.4 4.0 - 10.5 K/uL   RBC 3.63 (L) 3.87 - 5.11 MIL/uL   Hemoglobin 12.4 12.0 - 15.0 g/dL   HCT 40.9 81.1 - 91.4 %   MCV 101.1 (H) 80.0 - 100.0 fL   MCH 34.2 (H) 26.0 - 34.0 pg   MCHC 33.8 30.0 - 36.0 g/dL   RDW 78.2 95.6 - 21.3 %   Platelets 210 150 - 400 K/uL   nRBC 0.0 0.0 - 0.2 %    Comment: Performed at [facility], [address] [place], [place] [address]  Protime-INR     Status: None   Collection Time: 09/26/22  7:05 AM  Result Value Ref Range   Prothrombin Time 12.9 11.4 - 15.2 seconds   INR 1.0 0.8 - 1.2    Comment: (NOTE) INR goal varies based on device and disease states. Performed at [facility], [address] [place], [place] [address]   Basic metabolic panel     Status: Abnormal   Collection Time: 09/26/22  7:05 AM  Result Value Ref Range   Sodium 138  135 - 145 mmol/L   Potassium 3.9 3.5 - 5.1 mmol/L   Chloride 104 98 - 111 mmol/L   CO2 24 22 - 32 mmol/L   Glucose, Bld 100 (H) 70 - 99 mg/dL    Comment: Glucose reference range applies only to samples taken after fasting for at least 8 hours.   BUN 22 8 - 23 mg/dL   Creatinine, Ser 2.95 0.44 - 1.00 mg/dL   Calcium 9.0 8.9 - 28.4 mg/dL   GFR, Estimated >13 >24 mL/min    Comment: (NOTE) Calculated using the CKD-EPI Creatinine Equation (2021)    Anion gap 10 5 - 15    Comment: Performed at [facility], [address], [place], [place] [address]     Treatments: procedures: T12 and L1 kyphoplasty  Discharge Exam: Blood pressure 94/78, pulse 74, temperature 98 F (36.7 C), temperature source Oral, resp. rate 18, height 5\' 4"  (1.626 m), weight 119 lb (54 kg), SpO2 96 %. General appearance: alert, cooperative, and no distress Resp: clear to auscultation bilaterally Cardio: regular rate and rhythm, S1, S2 normal, no murmur, click, rub or gallop Extremities: extremities normal, atraumatic, no cyanosis or edema Pulses: 2+ and symmetric Neurologic: Alert and oriented X 3, normal strength and tone. Normal symmetric reflexes. Normal coordination  and gait No tenderness at kyphoplasty puncture sites, no bleeding or drainage from sites noted.  Disposition: Discharge disposition: 01-Home or Self Care       Discharge Instructions     Call MD for:  difficulty breathing, headache or visual disturbances   Complete by: As directed    Call MD for:  extreme fatigue   Complete by: As directed    Call MD for:  hives   Complete by: As directed    Call MD for:  persistant dizziness or light-headedness   Complete by: As directed    Call MD for:  persistant nausea and vomiting   Complete by: As directed    Call MD for:  redness, tenderness, or signs of infection (pain, swelling, redness, odor or green/yellow discharge around incision site)   Complete by: As directed    Call MD for:   severe uncontrolled pain   Complete by: As directed    Call MD for:  temperature >100.4   Complete by: As directed    Diet - low sodium heart healthy   Complete by: As directed    Discharge instructions   Complete by: As directed    Resume home medications. Avoid strenuous activity for 1 week. Do not drive for 1 week, do not lift objects heavier than 10 lbs for one week. Please call [facility] at [phone] with any questions or concerns regarding your kyphoplasty and/or puncture sites.   Driving Restrictions   Complete by: As directed    No driving for one week   Increase activity slowly   Complete by: As directed    Lifting restrictions   Complete by: As directed    No lifting objects heavier than 10 lbs for one week   Remove dressing in 24 hours   Complete by: As directed       Allergies as of 09/27/2022       Reactions   Statins Other (See Comments)   Muscle weakness and pain   Buprenorphine Hcl Other (See Comments)   Patient questioned this entry   Buspirone Other (See Comments)   Weakness   Lexapro [escitalopram] Other (See Comments)   "Muscle problems"   Lovastatin Other (See Comments)   Muscle pain   Meperidine Other (See Comments)   Demerol- questionable allergy- reaction not recalled   Morphine And Related Rash        Medication List     TAKE these medications    acetaminophen 500 MG tablet Commonly known as: TYLENOL Take 500 mg by mouth every 6 (six) hours as needed (for pain).   amLODipine 5 MG tablet Commonly known as: NORVASC Take 1 tablet (5 mg total) by mouth daily.   BIOTIN PO Take 30 mLs by mouth daily. Medication: Mouthwash   CALCIUM+D3 PO Take 1 tablet by mouth daily with breakfast.   docusate sodium 100 MG capsule Commonly known as: COLACE Take 1 capsule (100 mg total) by mouth 2 (two) times daily.   escitalopram 10 MG tablet Commonly known as: LEXAPRO Take 10 mg by mouth daily.   fish oil-omega-3 fatty acids 1000 MG  capsule Take 1,000 mg by mouth 2 (two) times daily after a meal.   lidocaine 5 % Commonly known as: Lidoderm Place 1 patch onto the skin daily. Remove & Discard patch within 12 hours or as directed by MD   PreserVision AREDS 2 Caps Take 1 capsule by mouth in the morning and at bedtime.   multivitamin with minerals tablet  Take 1 tablet by mouth daily with lunch.   Systane Ultra PF 0.4-0.3 % Soln Generic drug: Polyethyl Glyc-Propyl Glyc PF Place 1 drop into both eyes daily as needed (For dry eyes).          Electronically Signed: Kennieth Francois, PA-C 09/27/2022, 9:27 AM   I have spent Less Than 30 Minutes discharging Lauren Davidson.

## 2022-09-27 NOTE — Plan of Care (Signed)
Patient was discharged home with family today.

## 2022-09-27 NOTE — Evaluation (Signed)
Physical Therapy Evaluation Patient Details Name: Lauren Davidson MRN: [id number] DOB: [birth year] Today's Date: 09/27/2022  History of Present Illness  Pt is a 89 y.o. female s/p T12 and L1 kyphoplasty. PMH: h/o L2 comp fs s/p kyphoplasty Feb 2024, CKD, spinal stenosis, osteoporosis, anxiety, Sjogren's   Clinical Impression  PT eval complete. Supervision provided for transfers and amb 150' with RW. Steady gait noted. Pt reports she feels like a new person because the pain is so much better. Educated on BLT back precautions.  Plan is for return to ILF today. No further skilled PT intervention indicated. PT signing off.        Recommendations for follow up therapy are one component of a multi-disciplinary discharge planning process, led by the attending physician.  Recommendations may be updated based on patient status, additional functional criteria and insurance authorization.  Follow Up Recommendations       Assistance Recommended at Discharge Intermittent Supervision/Assistance  Patient can return home with the following       Equipment Recommendations None recommended by PT  Recommendations for Other Services       Functional Status Assessment Patient has had a recent decline in their functional status and demonstrates the ability to make significant improvements in function in a reasonable and predictable amount of time.     Precautions / Restrictions Precautions Precautions: Back Precaution Comments: Pt with order for lifting restrictions. Educated on full back precautions- BLT. Pt very familiar from previous procedure, and verbalized understanding.      Mobility  Bed Mobility Overal bed mobility: Modified Independent             General bed mobility comments: +rail, HOB elevated    Transfers Overall transfer level: Needs assistance Equipment used: Rolling walker (2 wheels) Transfers: Sit to/from Stand Sit to Stand: Supervision           General transfer  comment: increased time, no physical assist    Ambulation/Gait Ambulation/Gait assistance: Supervision Gait Distance (Feet): 150 Feet Assistive device: Rolling walker (2 wheels) Gait Pattern/deviations: Step-through pattern, Decreased stride length Gait velocity: WFL Gait velocity interpretation: 1.31 - 2.62 ft/sec, indicative of limited community ambulator   General Gait Details: Steady gait with RW. Pt reporting no pain initially but progressed to slight pain in back at end of gait trial.  Stairs            Wheelchair Mobility    Modified Rankin (Stroke Patients Only)       Balance Overall balance assessment: Needs assistance Sitting-balance support: No upper extremity supported, Feet supported Sitting balance-Leahy Scale: Good     Standing balance support: Bilateral upper extremity supported, Reliant on assistive device for balance, During functional activity Standing balance-Leahy Scale: Poor                               Pertinent Vitals/Pain Pain Assessment Pain Assessment: Faces Faces Pain Scale: Hurts a little bit Pain Location: back Pain Descriptors / Indicators: Grimacing, Discomfort Pain Intervention(s): Limited activity within patient's tolerance, Monitored during session, Repositioned    Home Living Family/patient expects to be discharged to:: Private residence Living Arrangements: Alone Available Help at Discharge: Family;Friend(s);Available PRN/intermittently;Personal care attendant Type of Home: Independent living facility [facility] Home Access: Level entry       Home Layout: One level Home Equipment: Rollator (4 wheels);Grab bars - tub/shower;Shower seat - built in;Grab bars - toilet      Prior  Function Prior Level of Function : Independent/Modified Independent             Mobility Comments: amb with rollator       Hand Dominance   Dominant Hand: Right    Extremity/Trunk Assessment   Upper Extremity  Assessment Upper Extremity Assessment: Overall WFL for tasks assessed    Lower Extremity Assessment Lower Extremity Assessment: Overall WFL for tasks assessed    Cervical / Trunk Assessment Cervical / Trunk Assessment: Kyphotic;Other exceptions Cervical / Trunk Exceptions: s/p kyphoplasty  Communication   Communication: HOH  Cognition Arousal/Alertness: Awake/alert Behavior During Therapy: WFL for tasks assessed/performed Overall Cognitive Status: Within Functional Limits for tasks assessed                                          General Comments General comments (skin integrity, edema, etc.): VSS on RA    Exercises     Assessment/Plan    PT Assessment Patient does not need any further PT services  PT Problem List         PT Treatment Interventions      PT Goals (Current goals can be found in the Care Plan section)  Acute Rehab PT Goals Patient Stated Goal: return to ILF today PT Goal Formulation: All assessment and education complete, DC therapy    Frequency       Co-evaluation               AM-PAC PT "6 Clicks" Mobility  Outcome Measure Help needed turning from your back to your side while in a flat bed without using bedrails?: None Help needed moving from lying on your back to sitting on the side of a flat bed without using bedrails?: None Help needed moving to and from a bed to a chair (including a wheelchair)?: A Little Help needed standing up from a chair using your arms (e.g., wheelchair or bedside chair)?: A Little Help needed to walk in hospital room?: A Little Help needed climbing 3-5 steps with a railing? : A Little 6 Click Score: 20    End of Session Equipment Utilized During Treatment: Gait belt Activity Tolerance: Patient tolerated treatment well Patient left: in bed;with call bell/phone within reach Nurse Communication: Mobility status PT Visit Diagnosis: Difficulty in walking, not elsewhere classified (R26.2)     Time: 4098-1191 PT Time Calculation (min) (ACUTE ONLY): 15 min   Charges:   PT Evaluation $PT Eval Low Complexity: 1 Low          Ferd Glassing., PT  Office # [phone]   Ilda Foil 09/27/2022, 9:51 AM

## 2022-10-04 DIAGNOSIS — M3500 Sicca syndrome, unspecified: Secondary | ICD-10-CM | POA: Diagnosis not present

## 2022-10-04 DIAGNOSIS — K5900 Constipation, unspecified: Secondary | ICD-10-CM | POA: Diagnosis not present

## 2022-10-04 DIAGNOSIS — M545 Low back pain, unspecified: Secondary | ICD-10-CM | POA: Diagnosis not present

## 2022-10-04 DIAGNOSIS — F411 Generalized anxiety disorder: Secondary | ICD-10-CM | POA: Diagnosis not present

## 2022-10-08 DIAGNOSIS — Z111 Encounter for screening for respiratory tuberculosis: Secondary | ICD-10-CM | POA: Diagnosis not present

## 2022-10-10 DIAGNOSIS — Z111 Encounter for screening for respiratory tuberculosis: Secondary | ICD-10-CM | POA: Diagnosis not present

## 2022-10-15 DIAGNOSIS — Z961 Presence of intraocular lens: Secondary | ICD-10-CM | POA: Diagnosis not present

## 2022-10-15 DIAGNOSIS — H04123 Dry eye syndrome of bilateral lacrimal glands: Secondary | ICD-10-CM | POA: Diagnosis not present

## 2022-10-15 DIAGNOSIS — H5203 Hypermetropia, bilateral: Secondary | ICD-10-CM | POA: Diagnosis not present

## 2022-10-28 ENCOUNTER — Encounter: Payer: Self-pay | Admitting: Physician Assistant

## 2022-11-05 DIAGNOSIS — M3500 Sicca syndrome, unspecified: Secondary | ICD-10-CM | POA: Diagnosis not present

## 2022-11-05 DIAGNOSIS — G894 Chronic pain syndrome: Secondary | ICD-10-CM | POA: Diagnosis not present

## 2022-11-05 DIAGNOSIS — F419 Anxiety disorder, unspecified: Secondary | ICD-10-CM | POA: Diagnosis not present

## 2022-11-05 DIAGNOSIS — I10 Essential (primary) hypertension: Secondary | ICD-10-CM | POA: Diagnosis not present

## 2022-11-05 DIAGNOSIS — K5900 Constipation, unspecified: Secondary | ICD-10-CM | POA: Diagnosis not present

## 2022-11-08 DIAGNOSIS — I10 Essential (primary) hypertension: Secondary | ICD-10-CM | POA: Diagnosis not present

## 2022-11-08 DIAGNOSIS — N182 Chronic kidney disease, stage 2 (mild): Secondary | ICD-10-CM | POA: Diagnosis not present

## 2022-11-08 DIAGNOSIS — M150 Primary generalized (osteo)arthritis: Secondary | ICD-10-CM | POA: Diagnosis not present

## 2022-11-08 DIAGNOSIS — Z79899 Other long term (current) drug therapy: Secondary | ICD-10-CM | POA: Diagnosis not present

## 2022-11-08 DIAGNOSIS — G894 Chronic pain syndrome: Secondary | ICD-10-CM | POA: Diagnosis not present

## 2022-11-08 DIAGNOSIS — M3500 Sicca syndrome, unspecified: Secondary | ICD-10-CM | POA: Diagnosis not present

## 2022-11-08 DIAGNOSIS — M810 Age-related osteoporosis without current pathological fracture: Secondary | ICD-10-CM | POA: Diagnosis not present

## 2022-11-08 DIAGNOSIS — E039 Hypothyroidism, unspecified: Secondary | ICD-10-CM | POA: Diagnosis not present

## 2022-11-08 DIAGNOSIS — F411 Generalized anxiety disorder: Secondary | ICD-10-CM | POA: Diagnosis not present

## 2022-11-08 DIAGNOSIS — E119 Type 2 diabetes mellitus without complications: Secondary | ICD-10-CM | POA: Diagnosis not present

## 2022-11-08 DIAGNOSIS — E559 Vitamin D deficiency, unspecified: Secondary | ICD-10-CM | POA: Diagnosis not present

## 2022-11-08 DIAGNOSIS — I129 Hypertensive chronic kidney disease with stage 1 through stage 4 chronic kidney disease, or unspecified chronic kidney disease: Secondary | ICD-10-CM | POA: Diagnosis not present

## 2022-11-12 DIAGNOSIS — G8929 Other chronic pain: Secondary | ICD-10-CM | POA: Diagnosis not present

## 2022-11-12 DIAGNOSIS — M549 Dorsalgia, unspecified: Secondary | ICD-10-CM | POA: Diagnosis not present

## 2022-11-12 DIAGNOSIS — M3500 Sicca syndrome, unspecified: Secondary | ICD-10-CM | POA: Diagnosis not present

## 2022-11-12 DIAGNOSIS — F411 Generalized anxiety disorder: Secondary | ICD-10-CM | POA: Diagnosis not present

## 2022-11-12 DIAGNOSIS — H9193 Unspecified hearing loss, bilateral: Secondary | ICD-10-CM | POA: Diagnosis not present

## 2022-11-13 DIAGNOSIS — M150 Primary generalized (osteo)arthritis: Secondary | ICD-10-CM | POA: Diagnosis not present

## 2022-11-13 DIAGNOSIS — I10 Essential (primary) hypertension: Secondary | ICD-10-CM | POA: Diagnosis not present

## 2022-11-13 DIAGNOSIS — M3500 Sicca syndrome, unspecified: Secondary | ICD-10-CM | POA: Diagnosis not present

## 2022-11-13 DIAGNOSIS — G894 Chronic pain syndrome: Secondary | ICD-10-CM | POA: Diagnosis not present

## 2022-11-13 DIAGNOSIS — F411 Generalized anxiety disorder: Secondary | ICD-10-CM | POA: Diagnosis not present

## 2022-11-13 DIAGNOSIS — M810 Age-related osteoporosis without current pathological fracture: Secondary | ICD-10-CM | POA: Diagnosis not present

## 2022-11-13 DIAGNOSIS — N182 Chronic kidney disease, stage 2 (mild): Secondary | ICD-10-CM | POA: Diagnosis not present

## 2022-11-13 DIAGNOSIS — I129 Hypertensive chronic kidney disease with stage 1 through stage 4 chronic kidney disease, or unspecified chronic kidney disease: Secondary | ICD-10-CM | POA: Diagnosis not present

## 2022-11-15 DIAGNOSIS — G894 Chronic pain syndrome: Secondary | ICD-10-CM | POA: Diagnosis not present

## 2022-11-15 DIAGNOSIS — M810 Age-related osteoporosis without current pathological fracture: Secondary | ICD-10-CM | POA: Diagnosis not present

## 2022-11-15 DIAGNOSIS — I129 Hypertensive chronic kidney disease with stage 1 through stage 4 chronic kidney disease, or unspecified chronic kidney disease: Secondary | ICD-10-CM | POA: Diagnosis not present

## 2022-11-15 DIAGNOSIS — F411 Generalized anxiety disorder: Secondary | ICD-10-CM | POA: Diagnosis not present

## 2022-11-15 DIAGNOSIS — N182 Chronic kidney disease, stage 2 (mild): Secondary | ICD-10-CM | POA: Diagnosis not present

## 2022-11-15 DIAGNOSIS — I10 Essential (primary) hypertension: Secondary | ICD-10-CM | POA: Diagnosis not present

## 2022-11-15 DIAGNOSIS — M3500 Sicca syndrome, unspecified: Secondary | ICD-10-CM | POA: Diagnosis not present

## 2022-11-15 DIAGNOSIS — M150 Primary generalized (osteo)arthritis: Secondary | ICD-10-CM | POA: Diagnosis not present

## 2022-11-18 DIAGNOSIS — M150 Primary generalized (osteo)arthritis: Secondary | ICD-10-CM | POA: Diagnosis not present

## 2022-11-18 DIAGNOSIS — M810 Age-related osteoporosis without current pathological fracture: Secondary | ICD-10-CM | POA: Diagnosis not present

## 2022-11-18 DIAGNOSIS — I10 Essential (primary) hypertension: Secondary | ICD-10-CM | POA: Diagnosis not present

## 2022-11-18 DIAGNOSIS — I129 Hypertensive chronic kidney disease with stage 1 through stage 4 chronic kidney disease, or unspecified chronic kidney disease: Secondary | ICD-10-CM | POA: Diagnosis not present

## 2022-11-18 DIAGNOSIS — F411 Generalized anxiety disorder: Secondary | ICD-10-CM | POA: Diagnosis not present

## 2022-11-18 DIAGNOSIS — N182 Chronic kidney disease, stage 2 (mild): Secondary | ICD-10-CM | POA: Diagnosis not present

## 2022-11-18 DIAGNOSIS — G894 Chronic pain syndrome: Secondary | ICD-10-CM | POA: Diagnosis not present

## 2022-11-18 DIAGNOSIS — M3500 Sicca syndrome, unspecified: Secondary | ICD-10-CM | POA: Diagnosis not present

## 2022-11-20 DIAGNOSIS — M3500 Sicca syndrome, unspecified: Secondary | ICD-10-CM | POA: Diagnosis not present

## 2022-11-20 DIAGNOSIS — I129 Hypertensive chronic kidney disease with stage 1 through stage 4 chronic kidney disease, or unspecified chronic kidney disease: Secondary | ICD-10-CM | POA: Diagnosis not present

## 2022-11-20 DIAGNOSIS — G894 Chronic pain syndrome: Secondary | ICD-10-CM | POA: Diagnosis not present

## 2022-11-20 DIAGNOSIS — I10 Essential (primary) hypertension: Secondary | ICD-10-CM | POA: Diagnosis not present

## 2022-11-20 DIAGNOSIS — N182 Chronic kidney disease, stage 2 (mild): Secondary | ICD-10-CM | POA: Diagnosis not present

## 2022-11-20 DIAGNOSIS — M810 Age-related osteoporosis without current pathological fracture: Secondary | ICD-10-CM | POA: Diagnosis not present

## 2022-11-20 DIAGNOSIS — F411 Generalized anxiety disorder: Secondary | ICD-10-CM | POA: Diagnosis not present

## 2022-11-20 DIAGNOSIS — M150 Primary generalized (osteo)arthritis: Secondary | ICD-10-CM | POA: Diagnosis not present

## 2022-11-26 DIAGNOSIS — M150 Primary generalized (osteo)arthritis: Secondary | ICD-10-CM | POA: Diagnosis not present

## 2022-11-26 DIAGNOSIS — F411 Generalized anxiety disorder: Secondary | ICD-10-CM | POA: Diagnosis not present

## 2022-11-26 DIAGNOSIS — I129 Hypertensive chronic kidney disease with stage 1 through stage 4 chronic kidney disease, or unspecified chronic kidney disease: Secondary | ICD-10-CM | POA: Diagnosis not present

## 2022-11-26 DIAGNOSIS — M810 Age-related osteoporosis without current pathological fracture: Secondary | ICD-10-CM | POA: Diagnosis not present

## 2022-11-26 DIAGNOSIS — N182 Chronic kidney disease, stage 2 (mild): Secondary | ICD-10-CM | POA: Diagnosis not present

## 2022-11-26 DIAGNOSIS — I10 Essential (primary) hypertension: Secondary | ICD-10-CM | POA: Diagnosis not present

## 2022-11-26 DIAGNOSIS — M3500 Sicca syndrome, unspecified: Secondary | ICD-10-CM | POA: Diagnosis not present

## 2022-11-26 DIAGNOSIS — G894 Chronic pain syndrome: Secondary | ICD-10-CM | POA: Diagnosis not present

## 2022-11-27 DIAGNOSIS — N182 Chronic kidney disease, stage 2 (mild): Secondary | ICD-10-CM | POA: Diagnosis not present

## 2022-11-27 DIAGNOSIS — I10 Essential (primary) hypertension: Secondary | ICD-10-CM | POA: Diagnosis not present

## 2022-11-27 DIAGNOSIS — G894 Chronic pain syndrome: Secondary | ICD-10-CM | POA: Diagnosis not present

## 2022-11-27 DIAGNOSIS — I129 Hypertensive chronic kidney disease with stage 1 through stage 4 chronic kidney disease, or unspecified chronic kidney disease: Secondary | ICD-10-CM | POA: Diagnosis not present

## 2022-11-27 DIAGNOSIS — M810 Age-related osteoporosis without current pathological fracture: Secondary | ICD-10-CM | POA: Diagnosis not present

## 2022-11-27 DIAGNOSIS — F411 Generalized anxiety disorder: Secondary | ICD-10-CM | POA: Diagnosis not present

## 2022-11-27 DIAGNOSIS — M3500 Sicca syndrome, unspecified: Secondary | ICD-10-CM | POA: Diagnosis not present

## 2022-11-27 DIAGNOSIS — M150 Primary generalized (osteo)arthritis: Secondary | ICD-10-CM | POA: Diagnosis not present

## 2022-11-28 DIAGNOSIS — H903 Sensorineural hearing loss, bilateral: Secondary | ICD-10-CM | POA: Diagnosis not present

## 2022-12-04 DIAGNOSIS — M150 Primary generalized (osteo)arthritis: Secondary | ICD-10-CM | POA: Diagnosis not present

## 2022-12-04 DIAGNOSIS — F411 Generalized anxiety disorder: Secondary | ICD-10-CM | POA: Diagnosis not present

## 2022-12-04 DIAGNOSIS — M810 Age-related osteoporosis without current pathological fracture: Secondary | ICD-10-CM | POA: Diagnosis not present

## 2022-12-04 DIAGNOSIS — G894 Chronic pain syndrome: Secondary | ICD-10-CM | POA: Diagnosis not present

## 2022-12-04 DIAGNOSIS — I10 Essential (primary) hypertension: Secondary | ICD-10-CM | POA: Diagnosis not present

## 2022-12-04 DIAGNOSIS — M3500 Sicca syndrome, unspecified: Secondary | ICD-10-CM | POA: Diagnosis not present

## 2022-12-04 DIAGNOSIS — N182 Chronic kidney disease, stage 2 (mild): Secondary | ICD-10-CM | POA: Diagnosis not present

## 2022-12-04 DIAGNOSIS — I129 Hypertensive chronic kidney disease with stage 1 through stage 4 chronic kidney disease, or unspecified chronic kidney disease: Secondary | ICD-10-CM | POA: Diagnosis not present

## 2022-12-05 DIAGNOSIS — M150 Primary generalized (osteo)arthritis: Secondary | ICD-10-CM | POA: Diagnosis not present

## 2022-12-05 DIAGNOSIS — M3500 Sicca syndrome, unspecified: Secondary | ICD-10-CM | POA: Diagnosis not present

## 2022-12-05 DIAGNOSIS — G894 Chronic pain syndrome: Secondary | ICD-10-CM | POA: Diagnosis not present

## 2022-12-05 DIAGNOSIS — M810 Age-related osteoporosis without current pathological fracture: Secondary | ICD-10-CM | POA: Diagnosis not present

## 2022-12-05 DIAGNOSIS — I129 Hypertensive chronic kidney disease with stage 1 through stage 4 chronic kidney disease, or unspecified chronic kidney disease: Secondary | ICD-10-CM | POA: Diagnosis not present

## 2022-12-05 DIAGNOSIS — N182 Chronic kidney disease, stage 2 (mild): Secondary | ICD-10-CM | POA: Diagnosis not present

## 2022-12-05 DIAGNOSIS — I10 Essential (primary) hypertension: Secondary | ICD-10-CM | POA: Diagnosis not present

## 2022-12-05 DIAGNOSIS — F411 Generalized anxiety disorder: Secondary | ICD-10-CM | POA: Diagnosis not present

## 2022-12-08 DIAGNOSIS — I129 Hypertensive chronic kidney disease with stage 1 through stage 4 chronic kidney disease, or unspecified chronic kidney disease: Secondary | ICD-10-CM | POA: Diagnosis not present

## 2022-12-08 DIAGNOSIS — G894 Chronic pain syndrome: Secondary | ICD-10-CM | POA: Diagnosis not present

## 2022-12-08 DIAGNOSIS — F411 Generalized anxiety disorder: Secondary | ICD-10-CM | POA: Diagnosis not present

## 2022-12-08 DIAGNOSIS — I10 Essential (primary) hypertension: Secondary | ICD-10-CM | POA: Diagnosis not present

## 2022-12-08 DIAGNOSIS — M3500 Sicca syndrome, unspecified: Secondary | ICD-10-CM | POA: Diagnosis not present

## 2022-12-08 DIAGNOSIS — M150 Primary generalized (osteo)arthritis: Secondary | ICD-10-CM | POA: Diagnosis not present

## 2022-12-08 DIAGNOSIS — M810 Age-related osteoporosis without current pathological fracture: Secondary | ICD-10-CM | POA: Diagnosis not present

## 2022-12-08 DIAGNOSIS — N182 Chronic kidney disease, stage 2 (mild): Secondary | ICD-10-CM | POA: Diagnosis not present

## 2022-12-09 DIAGNOSIS — I10 Essential (primary) hypertension: Secondary | ICD-10-CM | POA: Diagnosis not present

## 2022-12-09 DIAGNOSIS — N182 Chronic kidney disease, stage 2 (mild): Secondary | ICD-10-CM | POA: Diagnosis not present

## 2022-12-09 DIAGNOSIS — M810 Age-related osteoporosis without current pathological fracture: Secondary | ICD-10-CM | POA: Diagnosis not present

## 2022-12-09 DIAGNOSIS — M150 Primary generalized (osteo)arthritis: Secondary | ICD-10-CM | POA: Diagnosis not present

## 2022-12-09 DIAGNOSIS — F411 Generalized anxiety disorder: Secondary | ICD-10-CM | POA: Diagnosis not present

## 2022-12-09 DIAGNOSIS — M3500 Sicca syndrome, unspecified: Secondary | ICD-10-CM | POA: Diagnosis not present

## 2022-12-09 DIAGNOSIS — I129 Hypertensive chronic kidney disease with stage 1 through stage 4 chronic kidney disease, or unspecified chronic kidney disease: Secondary | ICD-10-CM | POA: Diagnosis not present

## 2022-12-09 DIAGNOSIS — G894 Chronic pain syndrome: Secondary | ICD-10-CM | POA: Diagnosis not present

## 2022-12-10 DIAGNOSIS — G894 Chronic pain syndrome: Secondary | ICD-10-CM | POA: Diagnosis not present

## 2022-12-10 DIAGNOSIS — I129 Hypertensive chronic kidney disease with stage 1 through stage 4 chronic kidney disease, or unspecified chronic kidney disease: Secondary | ICD-10-CM | POA: Diagnosis not present

## 2022-12-10 DIAGNOSIS — M3500 Sicca syndrome, unspecified: Secondary | ICD-10-CM | POA: Diagnosis not present

## 2022-12-10 DIAGNOSIS — F411 Generalized anxiety disorder: Secondary | ICD-10-CM | POA: Diagnosis not present

## 2022-12-10 DIAGNOSIS — M810 Age-related osteoporosis without current pathological fracture: Secondary | ICD-10-CM | POA: Diagnosis not present

## 2022-12-10 DIAGNOSIS — N182 Chronic kidney disease, stage 2 (mild): Secondary | ICD-10-CM | POA: Diagnosis not present

## 2022-12-10 DIAGNOSIS — M150 Primary generalized (osteo)arthritis: Secondary | ICD-10-CM | POA: Diagnosis not present

## 2022-12-10 DIAGNOSIS — I10 Essential (primary) hypertension: Secondary | ICD-10-CM | POA: Diagnosis not present

## 2022-12-11 DIAGNOSIS — M3500 Sicca syndrome, unspecified: Secondary | ICD-10-CM | POA: Diagnosis not present

## 2022-12-11 DIAGNOSIS — I129 Hypertensive chronic kidney disease with stage 1 through stage 4 chronic kidney disease, or unspecified chronic kidney disease: Secondary | ICD-10-CM | POA: Diagnosis not present

## 2022-12-11 DIAGNOSIS — M150 Primary generalized (osteo)arthritis: Secondary | ICD-10-CM | POA: Diagnosis not present

## 2022-12-11 DIAGNOSIS — I10 Essential (primary) hypertension: Secondary | ICD-10-CM | POA: Diagnosis not present

## 2022-12-11 DIAGNOSIS — M810 Age-related osteoporosis without current pathological fracture: Secondary | ICD-10-CM | POA: Diagnosis not present

## 2022-12-11 DIAGNOSIS — G894 Chronic pain syndrome: Secondary | ICD-10-CM | POA: Diagnosis not present

## 2022-12-11 DIAGNOSIS — N182 Chronic kidney disease, stage 2 (mild): Secondary | ICD-10-CM | POA: Diagnosis not present

## 2022-12-11 DIAGNOSIS — F411 Generalized anxiety disorder: Secondary | ICD-10-CM | POA: Diagnosis not present

## 2022-12-13 DIAGNOSIS — G894 Chronic pain syndrome: Secondary | ICD-10-CM | POA: Diagnosis not present

## 2022-12-13 DIAGNOSIS — I10 Essential (primary) hypertension: Secondary | ICD-10-CM | POA: Diagnosis not present

## 2022-12-13 DIAGNOSIS — K5900 Constipation, unspecified: Secondary | ICD-10-CM | POA: Diagnosis not present

## 2022-12-18 DIAGNOSIS — M3500 Sicca syndrome, unspecified: Secondary | ICD-10-CM | POA: Diagnosis not present

## 2022-12-18 DIAGNOSIS — M810 Age-related osteoporosis without current pathological fracture: Secondary | ICD-10-CM | POA: Diagnosis not present

## 2022-12-18 DIAGNOSIS — N182 Chronic kidney disease, stage 2 (mild): Secondary | ICD-10-CM | POA: Diagnosis not present

## 2022-12-18 DIAGNOSIS — I10 Essential (primary) hypertension: Secondary | ICD-10-CM | POA: Diagnosis not present

## 2022-12-18 DIAGNOSIS — G894 Chronic pain syndrome: Secondary | ICD-10-CM | POA: Diagnosis not present

## 2022-12-18 DIAGNOSIS — I129 Hypertensive chronic kidney disease with stage 1 through stage 4 chronic kidney disease, or unspecified chronic kidney disease: Secondary | ICD-10-CM | POA: Diagnosis not present

## 2022-12-18 DIAGNOSIS — F411 Generalized anxiety disorder: Secondary | ICD-10-CM | POA: Diagnosis not present

## 2022-12-18 DIAGNOSIS — M150 Primary generalized (osteo)arthritis: Secondary | ICD-10-CM | POA: Diagnosis not present

## 2022-12-20 DIAGNOSIS — M150 Primary generalized (osteo)arthritis: Secondary | ICD-10-CM | POA: Diagnosis not present

## 2022-12-20 DIAGNOSIS — I10 Essential (primary) hypertension: Secondary | ICD-10-CM | POA: Diagnosis not present

## 2022-12-20 DIAGNOSIS — M810 Age-related osteoporosis without current pathological fracture: Secondary | ICD-10-CM | POA: Diagnosis not present

## 2022-12-20 DIAGNOSIS — M3500 Sicca syndrome, unspecified: Secondary | ICD-10-CM | POA: Diagnosis not present

## 2022-12-20 DIAGNOSIS — G894 Chronic pain syndrome: Secondary | ICD-10-CM | POA: Diagnosis not present

## 2022-12-20 DIAGNOSIS — F411 Generalized anxiety disorder: Secondary | ICD-10-CM | POA: Diagnosis not present

## 2022-12-20 DIAGNOSIS — I129 Hypertensive chronic kidney disease with stage 1 through stage 4 chronic kidney disease, or unspecified chronic kidney disease: Secondary | ICD-10-CM | POA: Diagnosis not present

## 2022-12-20 DIAGNOSIS — N182 Chronic kidney disease, stage 2 (mild): Secondary | ICD-10-CM | POA: Diagnosis not present

## 2022-12-23 DIAGNOSIS — M3500 Sicca syndrome, unspecified: Secondary | ICD-10-CM | POA: Diagnosis not present

## 2022-12-23 DIAGNOSIS — G894 Chronic pain syndrome: Secondary | ICD-10-CM | POA: Diagnosis not present

## 2022-12-23 DIAGNOSIS — M150 Primary generalized (osteo)arthritis: Secondary | ICD-10-CM | POA: Diagnosis not present

## 2022-12-23 DIAGNOSIS — M810 Age-related osteoporosis without current pathological fracture: Secondary | ICD-10-CM | POA: Diagnosis not present

## 2022-12-23 DIAGNOSIS — I10 Essential (primary) hypertension: Secondary | ICD-10-CM | POA: Diagnosis not present

## 2022-12-23 DIAGNOSIS — I129 Hypertensive chronic kidney disease with stage 1 through stage 4 chronic kidney disease, or unspecified chronic kidney disease: Secondary | ICD-10-CM | POA: Diagnosis not present

## 2022-12-23 DIAGNOSIS — F411 Generalized anxiety disorder: Secondary | ICD-10-CM | POA: Diagnosis not present

## 2022-12-23 DIAGNOSIS — N182 Chronic kidney disease, stage 2 (mild): Secondary | ICD-10-CM | POA: Diagnosis not present

## 2022-12-24 DIAGNOSIS — M150 Primary generalized (osteo)arthritis: Secondary | ICD-10-CM | POA: Diagnosis not present

## 2022-12-24 DIAGNOSIS — N182 Chronic kidney disease, stage 2 (mild): Secondary | ICD-10-CM | POA: Diagnosis not present

## 2022-12-24 DIAGNOSIS — M810 Age-related osteoporosis without current pathological fracture: Secondary | ICD-10-CM | POA: Diagnosis not present

## 2022-12-24 DIAGNOSIS — G894 Chronic pain syndrome: Secondary | ICD-10-CM | POA: Diagnosis not present

## 2022-12-24 DIAGNOSIS — M3500 Sicca syndrome, unspecified: Secondary | ICD-10-CM | POA: Diagnosis not present

## 2022-12-24 DIAGNOSIS — F411 Generalized anxiety disorder: Secondary | ICD-10-CM | POA: Diagnosis not present

## 2022-12-24 DIAGNOSIS — I129 Hypertensive chronic kidney disease with stage 1 through stage 4 chronic kidney disease, or unspecified chronic kidney disease: Secondary | ICD-10-CM | POA: Diagnosis not present

## 2022-12-24 DIAGNOSIS — I10 Essential (primary) hypertension: Secondary | ICD-10-CM | POA: Diagnosis not present

## 2022-12-30 DIAGNOSIS — F411 Generalized anxiety disorder: Secondary | ICD-10-CM | POA: Diagnosis not present

## 2022-12-30 DIAGNOSIS — N182 Chronic kidney disease, stage 2 (mild): Secondary | ICD-10-CM | POA: Diagnosis not present

## 2022-12-30 DIAGNOSIS — M150 Primary generalized (osteo)arthritis: Secondary | ICD-10-CM | POA: Diagnosis not present

## 2022-12-30 DIAGNOSIS — M3500 Sicca syndrome, unspecified: Secondary | ICD-10-CM | POA: Diagnosis not present

## 2022-12-30 DIAGNOSIS — I129 Hypertensive chronic kidney disease with stage 1 through stage 4 chronic kidney disease, or unspecified chronic kidney disease: Secondary | ICD-10-CM | POA: Diagnosis not present

## 2022-12-30 DIAGNOSIS — M810 Age-related osteoporosis without current pathological fracture: Secondary | ICD-10-CM | POA: Diagnosis not present

## 2022-12-30 DIAGNOSIS — G894 Chronic pain syndrome: Secondary | ICD-10-CM | POA: Diagnosis not present

## 2022-12-30 DIAGNOSIS — I10 Essential (primary) hypertension: Secondary | ICD-10-CM | POA: Diagnosis not present

## 2023-01-06 DIAGNOSIS — I10 Essential (primary) hypertension: Secondary | ICD-10-CM | POA: Diagnosis not present

## 2023-01-06 DIAGNOSIS — M150 Primary generalized (osteo)arthritis: Secondary | ICD-10-CM | POA: Diagnosis not present

## 2023-01-06 DIAGNOSIS — I129 Hypertensive chronic kidney disease with stage 1 through stage 4 chronic kidney disease, or unspecified chronic kidney disease: Secondary | ICD-10-CM | POA: Diagnosis not present

## 2023-01-10 DIAGNOSIS — M150 Primary generalized (osteo)arthritis: Secondary | ICD-10-CM | POA: Diagnosis not present

## 2023-01-10 DIAGNOSIS — N182 Chronic kidney disease, stage 2 (mild): Secondary | ICD-10-CM | POA: Diagnosis not present

## 2023-01-10 DIAGNOSIS — I129 Hypertensive chronic kidney disease with stage 1 through stage 4 chronic kidney disease, or unspecified chronic kidney disease: Secondary | ICD-10-CM | POA: Diagnosis not present

## 2023-01-10 DIAGNOSIS — K5900 Constipation, unspecified: Secondary | ICD-10-CM | POA: Diagnosis not present

## 2023-01-24 DIAGNOSIS — R600 Localized edema: Secondary | ICD-10-CM | POA: Diagnosis not present

## 2023-01-24 DIAGNOSIS — K5900 Constipation, unspecified: Secondary | ICD-10-CM | POA: Diagnosis not present

## 2023-01-24 DIAGNOSIS — I10 Essential (primary) hypertension: Secondary | ICD-10-CM | POA: Diagnosis not present

## 2023-01-31 DIAGNOSIS — R0600 Dyspnea, unspecified: Secondary | ICD-10-CM | POA: Diagnosis not present

## 2023-01-31 DIAGNOSIS — I517 Cardiomegaly: Secondary | ICD-10-CM | POA: Diagnosis not present

## 2023-01-31 DIAGNOSIS — J90 Pleural effusion, not elsewhere classified: Secondary | ICD-10-CM | POA: Diagnosis not present

## 2023-01-31 DIAGNOSIS — I509 Heart failure, unspecified: Secondary | ICD-10-CM | POA: Diagnosis not present

## 2023-01-31 DIAGNOSIS — R609 Edema, unspecified: Secondary | ICD-10-CM | POA: Diagnosis not present

## 2023-02-06 DIAGNOSIS — I501 Left ventricular failure: Secondary | ICD-10-CM | POA: Diagnosis not present

## 2023-02-12 DIAGNOSIS — I517 Cardiomegaly: Secondary | ICD-10-CM | POA: Diagnosis not present

## 2023-02-12 DIAGNOSIS — R0600 Dyspnea, unspecified: Secondary | ICD-10-CM | POA: Diagnosis not present

## 2023-02-12 DIAGNOSIS — R609 Edema, unspecified: Secondary | ICD-10-CM | POA: Diagnosis not present

## 2023-02-12 DIAGNOSIS — I509 Heart failure, unspecified: Secondary | ICD-10-CM | POA: Diagnosis not present

## 2023-02-12 DIAGNOSIS — I10 Essential (primary) hypertension: Secondary | ICD-10-CM | POA: Diagnosis not present

## 2023-02-13 DIAGNOSIS — M3500 Sicca syndrome, unspecified: Secondary | ICD-10-CM | POA: Diagnosis not present

## 2023-02-13 DIAGNOSIS — I10 Essential (primary) hypertension: Secondary | ICD-10-CM | POA: Diagnosis not present

## 2023-02-14 DIAGNOSIS — I130 Hypertensive heart and chronic kidney disease with heart failure and stage 1 through stage 4 chronic kidney disease, or unspecified chronic kidney disease: Secondary | ICD-10-CM | POA: Diagnosis not present

## 2023-02-14 DIAGNOSIS — R609 Edema, unspecified: Secondary | ICD-10-CM | POA: Diagnosis not present

## 2023-02-14 DIAGNOSIS — M3500 Sicca syndrome, unspecified: Secondary | ICD-10-CM | POA: Diagnosis not present

## 2023-02-14 DIAGNOSIS — N182 Chronic kidney disease, stage 2 (mild): Secondary | ICD-10-CM | POA: Diagnosis not present

## 2023-03-04 DIAGNOSIS — R262 Difficulty in walking, not elsewhere classified: Secondary | ICD-10-CM | POA: Diagnosis not present

## 2023-03-04 DIAGNOSIS — I10 Essential (primary) hypertension: Secondary | ICD-10-CM | POA: Diagnosis not present

## 2023-03-04 DIAGNOSIS — N1831 Chronic kidney disease, stage 3a: Secondary | ICD-10-CM | POA: Diagnosis not present

## 2023-03-04 DIAGNOSIS — I341 Nonrheumatic mitral (valve) prolapse: Secondary | ICD-10-CM | POA: Diagnosis not present

## 2023-03-04 DIAGNOSIS — R0602 Shortness of breath: Secondary | ICD-10-CM | POA: Diagnosis not present

## 2023-03-13 DIAGNOSIS — I509 Heart failure, unspecified: Secondary | ICD-10-CM | POA: Diagnosis not present

## 2023-03-13 DIAGNOSIS — K5900 Constipation, unspecified: Secondary | ICD-10-CM | POA: Diagnosis not present

## 2023-03-13 DIAGNOSIS — I130 Hypertensive heart and chronic kidney disease with heart failure and stage 1 through stage 4 chronic kidney disease, or unspecified chronic kidney disease: Secondary | ICD-10-CM | POA: Diagnosis not present

## 2023-03-13 DIAGNOSIS — R609 Edema, unspecified: Secondary | ICD-10-CM | POA: Diagnosis not present

## 2023-03-13 DIAGNOSIS — N182 Chronic kidney disease, stage 2 (mild): Secondary | ICD-10-CM | POA: Diagnosis not present

## 2023-03-20 DIAGNOSIS — I10 Essential (primary) hypertension: Secondary | ICD-10-CM | POA: Diagnosis not present

## 2023-03-20 DIAGNOSIS — M3500 Sicca syndrome, unspecified: Secondary | ICD-10-CM | POA: Diagnosis not present

## 2023-04-10 DIAGNOSIS — I130 Hypertensive heart and chronic kidney disease with heart failure and stage 1 through stage 4 chronic kidney disease, or unspecified chronic kidney disease: Secondary | ICD-10-CM | POA: Diagnosis not present

## 2023-04-10 DIAGNOSIS — R609 Edema, unspecified: Secondary | ICD-10-CM | POA: Diagnosis not present

## 2023-04-10 DIAGNOSIS — I509 Heart failure, unspecified: Secondary | ICD-10-CM | POA: Diagnosis not present

## 2023-04-10 DIAGNOSIS — N182 Chronic kidney disease, stage 2 (mild): Secondary | ICD-10-CM | POA: Diagnosis not present

## 2023-05-01 DIAGNOSIS — R609 Edema, unspecified: Secondary | ICD-10-CM | POA: Diagnosis not present

## 2023-05-01 DIAGNOSIS — I130 Hypertensive heart and chronic kidney disease with heart failure and stage 1 through stage 4 chronic kidney disease, or unspecified chronic kidney disease: Secondary | ICD-10-CM | POA: Diagnosis not present

## 2023-05-01 DIAGNOSIS — N182 Chronic kidney disease, stage 2 (mild): Secondary | ICD-10-CM | POA: Diagnosis not present

## 2023-05-06 DIAGNOSIS — E559 Vitamin D deficiency, unspecified: Secondary | ICD-10-CM | POA: Diagnosis not present

## 2023-05-06 DIAGNOSIS — Z79899 Other long term (current) drug therapy: Secondary | ICD-10-CM | POA: Diagnosis not present

## 2023-05-06 DIAGNOSIS — E785 Hyperlipidemia, unspecified: Secondary | ICD-10-CM | POA: Diagnosis not present

## 2023-05-08 DIAGNOSIS — R609 Edema, unspecified: Secondary | ICD-10-CM | POA: Diagnosis not present

## 2023-05-08 DIAGNOSIS — I130 Hypertensive heart and chronic kidney disease with heart failure and stage 1 through stage 4 chronic kidney disease, or unspecified chronic kidney disease: Secondary | ICD-10-CM | POA: Diagnosis not present

## 2023-05-08 DIAGNOSIS — K5900 Constipation, unspecified: Secondary | ICD-10-CM | POA: Diagnosis not present

## 2023-05-08 DIAGNOSIS — K219 Gastro-esophageal reflux disease without esophagitis: Secondary | ICD-10-CM | POA: Diagnosis not present

## 2023-05-08 DIAGNOSIS — N182 Chronic kidney disease, stage 2 (mild): Secondary | ICD-10-CM | POA: Diagnosis not present

## 2023-05-10 ENCOUNTER — Inpatient Hospital Stay (HOSPITAL_COMMUNITY)
Admission: EM | Admit: 2023-05-10 | Discharge: 2023-05-16 | DRG: 481 | Disposition: A | Discharge status: Skilled Nursing Facility | Payer: Medicare HMO | Source: Skilled Nursing Facility | Attending: Internal Medicine | Admitting: Internal Medicine

## 2023-05-10 ENCOUNTER — Emergency Department (HOSPITAL_COMMUNITY): Payer: Medicare HMO

## 2023-05-10 ENCOUNTER — Other Ambulatory Visit: Payer: Self-pay

## 2023-05-10 ENCOUNTER — Encounter (HOSPITAL_COMMUNITY): Payer: Self-pay

## 2023-05-10 DIAGNOSIS — R2689 Other abnormalities of gait and mobility: Secondary | ICD-10-CM | POA: Diagnosis not present

## 2023-05-10 DIAGNOSIS — Z888 Allergy status to other drugs, medicaments and biological substances status: Secondary | ICD-10-CM

## 2023-05-10 DIAGNOSIS — K219 Gastro-esophageal reflux disease without esophagitis: Secondary | ICD-10-CM | POA: Diagnosis present

## 2023-05-10 DIAGNOSIS — Z7401 Bed confinement status: Secondary | ICD-10-CM | POA: Diagnosis not present

## 2023-05-10 DIAGNOSIS — I6782 Cerebral ischemia: Secondary | ICD-10-CM | POA: Diagnosis not present

## 2023-05-10 DIAGNOSIS — I081 Rheumatic disorders of both mitral and tricuspid valves: Secondary | ICD-10-CM | POA: Diagnosis present

## 2023-05-10 DIAGNOSIS — W19XXXA Unspecified fall, initial encounter: Secondary | ICD-10-CM | POA: Diagnosis not present

## 2023-05-10 DIAGNOSIS — R0902 Hypoxemia: Secondary | ICD-10-CM | POA: Diagnosis not present

## 2023-05-10 DIAGNOSIS — I129 Hypertensive chronic kidney disease with stage 1 through stage 4 chronic kidney disease, or unspecified chronic kidney disease: Secondary | ICD-10-CM | POA: Diagnosis not present

## 2023-05-10 DIAGNOSIS — S79929A Unspecified injury of unspecified thigh, initial encounter: Secondary | ICD-10-CM | POA: Diagnosis not present

## 2023-05-10 DIAGNOSIS — Z9181 History of falling: Secondary | ICD-10-CM | POA: Diagnosis not present

## 2023-05-10 DIAGNOSIS — I10 Essential (primary) hypertension: Secondary | ICD-10-CM | POA: Diagnosis not present

## 2023-05-10 DIAGNOSIS — S72142A Displaced intertrochanteric fracture of left femur, initial encounter for closed fracture: Secondary | ICD-10-CM | POA: Diagnosis not present

## 2023-05-10 DIAGNOSIS — Z66 Do not resuscitate: Secondary | ICD-10-CM | POA: Diagnosis not present

## 2023-05-10 DIAGNOSIS — Z885 Allergy status to narcotic agent status: Secondary | ICD-10-CM | POA: Diagnosis not present

## 2023-05-10 DIAGNOSIS — Z79899 Other long term (current) drug therapy: Secondary | ICD-10-CM

## 2023-05-10 DIAGNOSIS — E785 Hyperlipidemia, unspecified: Secondary | ICD-10-CM | POA: Diagnosis present

## 2023-05-10 DIAGNOSIS — D62 Acute posthemorrhagic anemia: Secondary | ICD-10-CM | POA: Diagnosis not present

## 2023-05-10 DIAGNOSIS — M549 Dorsalgia, unspecified: Secondary | ICD-10-CM | POA: Diagnosis not present

## 2023-05-10 DIAGNOSIS — H9190 Unspecified hearing loss, unspecified ear: Secondary | ICD-10-CM | POA: Diagnosis not present

## 2023-05-10 DIAGNOSIS — I110 Hypertensive heart disease with heart failure: Secondary | ICD-10-CM | POA: Diagnosis not present

## 2023-05-10 DIAGNOSIS — F419 Anxiety disorder, unspecified: Secondary | ICD-10-CM | POA: Diagnosis not present

## 2023-05-10 DIAGNOSIS — Z743 Need for continuous supervision: Secondary | ICD-10-CM | POA: Diagnosis not present

## 2023-05-10 DIAGNOSIS — M3500 Sicca syndrome, unspecified: Secondary | ICD-10-CM | POA: Diagnosis present

## 2023-05-10 DIAGNOSIS — W010XXA Fall on same level from slipping, tripping and stumbling without subsequent striking against object, initial encounter: Secondary | ICD-10-CM | POA: Diagnosis present

## 2023-05-10 DIAGNOSIS — S0990XA Unspecified injury of head, initial encounter: Secondary | ICD-10-CM | POA: Diagnosis not present

## 2023-05-10 DIAGNOSIS — R262 Difficulty in walking, not elsewhere classified: Secondary | ICD-10-CM | POA: Diagnosis not present

## 2023-05-10 DIAGNOSIS — M85852 Other specified disorders of bone density and structure, left thigh: Secondary | ICD-10-CM | POA: Diagnosis not present

## 2023-05-10 DIAGNOSIS — I5032 Chronic diastolic (congestive) heart failure: Secondary | ICD-10-CM | POA: Diagnosis present

## 2023-05-10 DIAGNOSIS — S72142D Displaced intertrochanteric fracture of left femur, subsequent encounter for closed fracture with routine healing: Secondary | ICD-10-CM | POA: Diagnosis not present

## 2023-05-10 DIAGNOSIS — G8929 Other chronic pain: Secondary | ICD-10-CM | POA: Diagnosis present

## 2023-05-10 DIAGNOSIS — Y9301 Activity, walking, marching and hiking: Secondary | ICD-10-CM | POA: Diagnosis present

## 2023-05-10 DIAGNOSIS — N182 Chronic kidney disease, stage 2 (mild): Secondary | ICD-10-CM | POA: Diagnosis not present

## 2023-05-10 DIAGNOSIS — S72102A Unspecified trochanteric fracture of left femur, initial encounter for closed fracture: Secondary | ICD-10-CM | POA: Diagnosis not present

## 2023-05-10 DIAGNOSIS — R2681 Unsteadiness on feet: Secondary | ICD-10-CM | POA: Diagnosis not present

## 2023-05-10 DIAGNOSIS — R531 Weakness: Secondary | ICD-10-CM | POA: Diagnosis not present

## 2023-05-10 DIAGNOSIS — M6281 Muscle weakness (generalized): Secondary | ICD-10-CM | POA: Diagnosis not present

## 2023-05-10 DIAGNOSIS — R1312 Dysphagia, oropharyngeal phase: Secondary | ICD-10-CM | POA: Diagnosis not present

## 2023-05-10 HISTORY — DX: Displaced intertrochanteric fracture of left femur, initial encounter for closed fracture: S72.142A

## 2023-05-10 LAB — COMPREHENSIVE METABOLIC PANEL
ALT: 17 U/L (ref 0–44)
AST: 32 U/L (ref 15–41)
Albumin: 3.6 g/dL (ref 3.5–5.0)
Alkaline Phosphatase: 98 U/L (ref 38–126)
Anion gap: 11 (ref 5–15)
BUN: 19 mg/dL (ref 8–23)
CO2: 23 mmol/L (ref 22–32)
Calcium: 8.8 mg/dL — ABNORMAL LOW (ref 8.9–10.3)
Chloride: 98 mmol/L (ref 98–111)
Creatinine, Ser: 0.62 mg/dL (ref 0.44–1.00)
GFR, Estimated: >60 mL/min (ref >60)
Glucose, Bld: 104 mg/dL — ABNORMAL HIGH (ref 70–99)
Potassium: 4.1 mmol/L (ref 3.5–5.1)
Sodium: 132 mmol/L — ABNORMAL LOW (ref 135–145)
Total Bilirubin: 0.8 mg/dL (ref <1.2)
Total Protein: 6.7 g/dL (ref 6.5–8.1)

## 2023-05-10 LAB — CBC WITH DIFFERENTIAL/PLATELET
Abs Immature Granulocytes: 0.04 10*3/uL (ref 0.00–0.07)
Basophils Absolute: 0 10*3/uL (ref 0.0–0.1)
Basophils Relative: 0 %
Eosinophils Absolute: 0.1 10*3/uL (ref 0.0–0.5)
Eosinophils Relative: 1 %
HCT: 37.7 % (ref 36.0–46.0)
Hemoglobin: 12.6 g/dL (ref 12.0–15.0)
Immature Granulocytes: 1 %
Lymphocytes Relative: 10 %
Lymphs Abs: 0.8 10*3/uL (ref 0.7–4.0)
MCH: 33.2 pg (ref 26.0–34.0)
MCHC: 33.4 g/dL (ref 30.0–36.0)
MCV: 99.5 fL (ref 80.0–100.0)
Monocytes Absolute: 1.1 10*3/uL — ABNORMAL HIGH (ref 0.1–1.0)
Monocytes Relative: 13 %
Neutro Abs: 6.4 10*3/uL (ref 1.7–7.7)
Neutrophils Relative %: 75 %
Platelets: 147 10*3/uL — ABNORMAL LOW (ref 150–400)
RBC: 3.79 MIL/uL — ABNORMAL LOW (ref 3.87–5.11)
RDW: 13.2 % (ref 11.5–15.5)
WBC: 8.5 10*3/uL (ref 4.0–10.5)
nRBC: 0 % (ref 0.0–0.2)

## 2023-05-10 LAB — MAGNESIUM: Magnesium: 2.6 mg/dL — ABNORMAL HIGH (ref 1.7–2.4)

## 2023-05-10 LAB — PHOSPHORUS: Phosphorus: 3.6 mg/dL (ref 2.5–4.6)

## 2023-05-10 LAB — PROTIME-INR
INR: 1 (ref 0.8–1.2)
Prothrombin Time: 13.2 s (ref 11.4–15.2)

## 2023-05-10 LAB — VITAMIN D 25 HYDROXY (VIT D DEFICIENCY, FRACTURES): Vit D, 25-Hydroxy: 64.24 ng/mL (ref 30–100)

## 2023-05-10 LAB — TSH: TSH: 2.06 u[IU]/mL (ref 0.350–4.500)

## 2023-05-10 LAB — VITAMIN B12: Vitamin B-12: 412 pg/mL (ref 180–914)

## 2023-05-10 MED ORDER — DOCUSATE SODIUM 100 MG PO CAPS
100.0000 mg | ORAL_CAPSULE | Freq: Two times a day (BID) | ORAL | Status: DC
  Administered 2023-05-10 – 2023-05-16 (×11): 100 mg via ORAL
  Filled 2023-05-10 (×12): qty 1

## 2023-05-10 MED ORDER — ACETAMINOPHEN 325 MG PO TABS
650.0000 mg | ORAL_TABLET | Freq: Four times a day (QID) | ORAL | Status: DC | PRN
  Administered 2023-05-10 – 2023-05-16 (×7): 650 mg via ORAL
  Filled 2023-05-10 (×7): qty 2

## 2023-05-10 MED ORDER — PROSIGHT PO TABS
1.0000 | ORAL_TABLET | Freq: Every day | ORAL | Status: DC
  Administered 2023-05-12 – 2023-05-16 (×5): 1 via ORAL
  Filled 2023-05-10 (×6): qty 1

## 2023-05-10 MED ORDER — ONDANSETRON HCL 4 MG/2ML IJ SOLN
4.0000 mg | Freq: Three times a day (TID) | INTRAMUSCULAR | Status: DC | PRN
  Filled 2023-05-10: qty 2

## 2023-05-10 MED ORDER — ENOXAPARIN SODIUM 40 MG/0.4ML IJ SOSY
40.0000 mg | PREFILLED_SYRINGE | INTRAMUSCULAR | Status: DC
  Administered 2023-05-11 – 2023-05-15 (×5): 40 mg via SUBCUTANEOUS
  Filled 2023-05-10 (×5): qty 0.4

## 2023-05-10 MED ORDER — HYDROMORPHONE HCL 1 MG/ML IJ SOLN
0.5000 mg | Freq: Four times a day (QID) | INTRAMUSCULAR | Status: DC | PRN
  Administered 2023-05-11: 0.5 mg via INTRAVENOUS
  Filled 2023-05-10: qty 0.5

## 2023-05-10 MED ORDER — HYDROMORPHONE HCL 1 MG/ML IJ SOLN
0.5000 mg | Freq: Once | INTRAMUSCULAR | Status: AC
  Administered 2023-05-10: 0.5 mg via INTRAVENOUS
  Filled 2023-05-10: qty 1

## 2023-05-10 MED ORDER — ONDANSETRON HCL 4 MG/2ML IJ SOLN
4.0000 mg | Freq: Once | INTRAMUSCULAR | Status: AC
  Administered 2023-05-10: 4 mg via INTRAVENOUS
  Filled 2023-05-10: qty 2

## 2023-05-10 MED ORDER — ENOXAPARIN SODIUM 40 MG/0.4ML IJ SOSY: 40.0000 mg | PREFILLED_SYRINGE | INTRAMUSCULAR | Status: DC

## 2023-05-10 MED ORDER — OYSTER SHELL CALCIUM/D3 500-5 MG-MCG PO TABS
1.0000 | ORAL_TABLET | Freq: Every day | ORAL | Status: DC
  Administered 2023-05-12 – 2023-05-16 (×5): 1 via ORAL
  Filled 2023-05-10 (×5): qty 1

## 2023-05-10 MED ORDER — SENNOSIDES-DOCUSATE SODIUM 8.6-50 MG PO TABS: 1.0000 | ORAL_TABLET | Freq: Every evening | ORAL | Status: DC | PRN

## 2023-05-10 MED ORDER — ONDANSETRON HCL 4 MG PO TABS: 4.0000 mg | ORAL_TABLET | Freq: Three times a day (TID) | ORAL | Status: DC | PRN

## 2023-05-10 MED ORDER — PANTOPRAZOLE SODIUM 40 MG PO TBEC
40.0000 mg | DELAYED_RELEASE_TABLET | Freq: Every morning | ORAL | Status: DC
Start: 2023-05-11 — End: 2023-05-16
  Administered 2023-05-12 – 2023-05-16 (×5): 40 mg via ORAL
  Filled 2023-05-10 (×6): qty 1

## 2023-05-10 MED ORDER — CHLORHEXIDINE GLUCONATE 4 % EX SOLN
60.0000 mL | Freq: Once | CUTANEOUS | Status: AC
  Administered 2023-05-11: 4 via TOPICAL
  Filled 2023-05-10: qty 60

## 2023-05-10 MED ORDER — AMLODIPINE BESYLATE 5 MG PO TABS
5.0000 mg | ORAL_TABLET | Freq: Every day | ORAL | Status: DC
  Administered 2023-05-13 – 2023-05-16 (×4): 5 mg via ORAL
  Filled 2023-05-10 (×6): qty 1

## 2023-05-10 MED ORDER — POLYVINYL ALCOHOL 1.4 % OP SOLN
1.0000 [drp] | Freq: Three times a day (TID) | OPHTHALMIC | Status: DC
  Administered 2023-05-10 – 2023-05-16 (×16): 1 [drp] via OPHTHALMIC
  Filled 2023-05-10: qty 15

## 2023-05-10 NOTE — ED Provider Notes (Signed)
  Physical Exam  BP (!) 155/73 (BP Location: Right Arm)   Pulse 87   Temp (!) 97.5 F (36.4 C) (Oral)   Resp 19   SpO2 96%   Physical Exam  Procedures  Procedures  ED Course / MDM   Clinical Course as of 05/10/23 1526  Sat May 10, 2023  1521 Assumed care. 89 yo F who walks with a walker presenting from her ALF after mechanical fall.  Has an intertrochanteric hip fracture.  Awaiting callback from hospitalist. [RP]  1525 Dr Kirke Corin from [facility] hospitalist to admit the patient. [RP]    Clinical Course User Index [RP] Rondel Baton, MD   Medical Decision Making Amount and/or Complexity of Data Reviewed Labs: ordered. Radiology: ordered.  Risk Prescription drug management. Decision regarding hospitalization.      Rondel Baton, MD 05/10/23 [phone]

## 2023-05-10 NOTE — ED Provider Notes (Signed)
[place] EMERGENCY DEPARTMENT AT [facility] Provider Note   CSN: [id number] Arrival date & time: 05/10/23  1228     History {Add pertinent medical, surgical, social history, OB history to HPI:1} Chief Complaint  Patient presents with   Fall   Hip Pain    Lauren Davidson is a 89 y.o. female.  Patient has a history of hypertension and hyperlipidemia.  She slipped and fell today at the assisted living.  She usually uses a walker.  She complains of pain in her left hip  The history is provided by the patient and medical records. No language interpreter was used.  Fall This is a new problem. The current episode started 6 to 12 hours ago. The problem occurs rarely. The problem has not changed since onset.Pertinent negatives include no chest pain, no abdominal pain and no headaches. Nothing aggravates the symptoms. Nothing relieves the symptoms.  Hip Pain Pertinent negatives include no chest pain, no abdominal pain and no headaches.       Home Medications Prior to Admission medications   Medication Sig Start Date End Date Taking? Authorizing Provider  acetaminophen (TYLENOL) 500 MG tablet Take 500 mg by mouth every 6 (six) hours as needed (for pain).    [provider]  amLODipine (NORVASC) 5 MG tablet Take 1 tablet (5 mg total) by mouth daily. 06/26/22   Meredeth Ide, MD  BIOTIN PO Take 30 mLs by mouth daily. Medication: Mouthwash    [provider]  Calcium Carb-Cholecalciferol (CALCIUM+D3 PO) Take 1 tablet by mouth daily with breakfast.    [provider]  docusate sodium (COLACE) 100 MG capsule Take 1 capsule (100 mg total) by mouth 2 (two) times daily. 09/20/21   Jerald Kief, MD  escitalopram (LEXAPRO) 10 MG tablet Take 10 mg by mouth daily.    [provider]  fish oil-omega-3 fatty acids 1000 MG capsule Take 1,000 mg by mouth 2 (two) times daily after a meal.    [provider]  lidocaine (LIDODERM) 5 % Place 1 patch  onto the skin daily. Remove & Discard patch within 12 hours or as directed by MD Patient not taking: Reported on 09/26/2022 06/26/22   Meredeth Ide, MD  Multiple Vitamins-Minerals (MULTIVITAMIN WITH MINERALS) tablet Take 1 tablet by mouth daily with lunch.    [provider]  Multiple Vitamins-Minerals (PRESERVISION AREDS 2) CAPS Take 1 capsule by mouth in the morning and at bedtime.    [provider]  SYSTANE ULTRA PF 0.4-0.3 % SOLN Place 1 drop into both eyes daily as needed (For dry eyes).    [provider]      Allergies    Statins, Buprenorphine hcl, Buspirone, Lexapro [escitalopram], Lovastatin, Meperidine, and Morphine and codeine    Review of Systems   Review of Systems  Constitutional:  Negative for appetite change and fatigue.  HENT:  Negative for congestion, ear discharge and sinus pressure.   Eyes:  Negative for discharge.  Respiratory:  Negative for cough.   Cardiovascular:  Negative for chest pain.  Gastrointestinal:  Negative for abdominal pain and diarrhea.  Genitourinary:  Negative for frequency and hematuria.  Musculoskeletal:  Negative for back pain.       Left hip pain  Skin:  Negative for rash.  Neurological:  Negative for seizures and headaches.  Psychiatric/Behavioral:  Negative for hallucinations.     Physical Exam Updated Vital Signs BP (!) 155/73 (BP Location: Right Arm)   Pulse  87   Temp (!) 97.5 F (36.4 C) (Oral)   Resp 19   SpO2 96%  Physical Exam Vitals and nursing note reviewed.  Constitutional:      Appearance: She is well-developed.  HENT:     Head: Normocephalic.     Comments: Mild bruising to her face    Nose: Nose normal.  Eyes:     General: No scleral icterus.    Conjunctiva/sclera: Conjunctivae normal.  Neck:     Thyroid: No thyromegaly.  Cardiovascular:     Rate and Rhythm: Normal rate and regular rhythm.     Heart sounds: No murmur heard.    No friction rub. No gallop.  Pulmonary:     Breath sounds:  No stridor. No wheezing or rales.  Chest:     Chest wall: No tenderness.  Abdominal:     General: There is no distension.     Tenderness: There is no abdominal tenderness. There is no rebound.  Musculoskeletal:        General: Normal range of motion.     Cervical back: Neck supple.     Comments: Tenderness to left hip with deformity.  Lymphadenopathy:     Cervical: No cervical adenopathy.  Skin:    Findings: No erythema or rash.  Neurological:     Mental Status: She is alert and oriented to person, place, and time.     Motor: No abnormal muscle tone.     Coordination: Coordination normal.  Psychiatric:        Behavior: Behavior normal.     ED Results / Procedures / Treatments   Labs (all labs ordered are listed, but only abnormal results are displayed) Labs Reviewed  CBC WITH DIFFERENTIAL/PLATELET - Abnormal; Notable for the following components:      Result Value   RBC 3.79 (*)    Platelets 147 (*)    Monocytes Absolute 1.1 (*)    All other components within normal limits  COMPREHENSIVE METABOLIC PANEL - Abnormal; Notable for the following components:   Sodium 132 (*)    Glucose, Bld 104 (*)    Calcium 8.8 (*)    All other components within normal limits    EKG None  Radiology CT Head Wo Contrast Result Date: 05/10/2023 CLINICAL DATA:  Head trauma.  Unwitnessed fall. EXAM: CT HEAD WITHOUT CONTRAST TECHNIQUE: Contiguous axial images were obtained from the base of the skull through the vertex without intravenous contrast. RADIATION DOSE REDUCTION: This exam was performed according to the departmental dose-optimization program which includes automated exposure control, adjustment of the mA and/or kV according to patient size and/or use of iterative reconstruction technique. COMPARISON:  07/30/2022 FINDINGS: Brain: There is no evidence for acute hemorrhage, hydrocephalus, mass lesion, or abnormal extra-axial fluid collection. No definite CT evidence for acute infarction.  Diffuse loss of parenchymal volume is consistent with atrophy. Patchy low attenuation in the deep hemispheric and periventricular white matter is nonspecific, but likely reflects chronic microvascular ischemic demyelination. Vascular: No hyperdense vessel or unexpected calcification. Skull: No evidence for fracture. No worrisome lytic or sclerotic lesion. Sinuses/Orbits: The visualized paranasal sinuses and mastoid air cells are clear. Other: None. IMPRESSION: 1. No acute intracranial abnormality. 2. Atrophy with chronic small vessel ischemic disease. Electronically Signed   By: Kennith Center M.D.   On: 05/10/2023 14:10   DG HIP UNILAT WITH PELVIS 2-3 VIEWS LEFT Result Date: 05/10/2023 CLINICAL DATA:  Fall with hip pain. EXAM: DG HIP (WITH OR WITHOUT PELVIS) 2-3V LEFT COMPARISON:  Hip radiographs dated 07/18/2022. FINDINGS: There is an acute fracture of the intertrochanteric region of the left femur. No hip joint dislocation. Degenerative changes are seen in the spine. IMPRESSION: Acute intertrochanteric fracture of the left femur. Electronically Signed   By: Romona Curls M.D.   On: 05/10/2023 14:03    Procedures Procedures  {Document cardiac monitor, telemetry assessment procedure when appropriate:1}  Medications Ordered in ED Medications  HYDROmorphone (DILAUDID) injection 0.5 mg (0.5 mg Intravenous Given 05/10/23 1411)  ondansetron (ZOFRAN) injection 4 mg (4 mg Intravenous Given 05/10/23 1410)    ED Course/ Medical Decision Making/ A&P   {Patient with intertrochanteric fracture on the left is closed.  Dr.Ramanathan orthopedics has been consulted and he will repair the fractured hip.  Patient needs to be n.p.o. tonight after midnight. Click here for ABCD2, HEART and other calculatorsREFRESH Note before signing :1}                              Medical Decision Making Amount and/or Complexity of Data Reviewed Labs: ordered. Radiology: ordered.  Risk Prescription drug  management. Decision regarding hospitalization.   Intertrochanteric fracture of the left hip.  Patient will be admitted to medicine with Ortho consulting  {Document critical care time when appropriate:1} {Document review of labs and clinical decision tools ie heart score, Chads2Vasc2 etc:1}  {Document your independent review of radiology images, and any outside records:1} {Document your discussion with family members, caretakers, and with consultants:1} {Document social determinants of health affecting pt's care:1} {Document your decision making why or why not admission, treatments were needed:1} Final Clinical Impression(s) / ED Diagnoses Final diagnoses:  Closed displaced intertrochanteric fracture of left femur, initial encounter (HCC)    Rx / DC Orders ED Discharge Orders     None

## 2023-05-10 NOTE — Care Plan (Addendum)
Orthopaedic Surgery Plan of Care Note   -history and imaging reviewed with primary team (ER) -pt has left intertrochanteric hip fracture sustained today -PMH includes HTN, generally healthy otherwise. Per ER, pt is independently ambulatory without assistive device -admit to Hospitalist team -please keep NPO and hold VTE ppx from midnight -plan for OR tomorrow first round for left femur IMN -preop orders placed including full length left femur films -full consult note to follow   Netta Cedars, MD Orthopaedic Surgery [facility]

## 2023-05-10 NOTE — H&P (Signed)
History and Physical    Patient: Lauren Davidson MVH:[id number] DOB: Jan 07, 1922 DOA: 05/10/2023 DOS: the patient was seen and examined on 05/10/2023 PCP: Merlene Laughter, MD (Inactive)  Patient coming from: ALF/ILF  Chief Complaint:  Chief Complaint  Patient presents with   Fall   Hip Pain   HPI: Lauren Davidson is a 89 y.o. female with medical history significant of HTN, HLD, Sjogren's syndrome, chronic back pain, compression fracture s/p kyphoplasty, mitral and tricuspid regurg, HFpEF and GERD who presents from her ALF, [facility], after an unwitnessed fall. Patient states she felt fine in the morning and went for breakfast in the cafeteria. The facility had a program with some high school that included single and and playing with balloons. She was having some problems with her bifocals during this activity. While walking back to her room with her walker, she slipped and fell on her left side. The staff found patient sitting on the floor. She endorse left hip pain but reports she did not feel dizzy or any other symptoms prior to the fall. She denies hitting her head. She has no headaches, vision changes, abdominal pain, nausea, vomiting, leg pain, shortness of breath or chest pain. Her pain is well-controlled but states she feels the pain only when she moves.   ED course: Initial vitals with temp 97.5, RR 19, HR 87, BP 155/73, SpO2 96% on room air. Labs show WBC 8.5, Hgb 12.6, platelet 147, sodium 132, K+ 4.1, creatinine 0.62, TSH 2.06, PT/INR 13.2/1.0, mag 2.6, Phos 3.6. CT head with no acute intracranial abnormality. Hip x-ray shows acute intratrochanteric fracture of the left femur Left femur x-ray shows mildly displaced intratrochanteric fracture of the left femur Received IV Dilaudid 0.5 mg x 1 and IV Zofran 4 mg x 1 Orthopedic surgery was consulted for evaluation, recommended medicine admit for OR tomorrow [facility] was consulted for admission   Review of Systems: As mentioned in the  history of present illness. All other systems reviewed and are negative. Past Medical History:  Diagnosis Date   Hyperlipidemia    Past Surgical History:  Procedure Laterality Date   IR KYPHO LUMBAR INC FX REDUCE BONE BX UNI/BIL CANNULATION INC/IMAGING  06/20/2022   IR KYPHO LUMBAR INC FX REDUCE BONE BX UNI/BIL CANNULATION INC/IMAGING  09/26/2022   IR KYPHO THORACIC WITH BONE BIOPSY  09/26/2022   Social History:  reports that she has never smoked. She has never used smokeless tobacco. She reports that she does not drink alcohol and does not use drugs. She ambulates with a walker. Independent with most of her ADLs.  Allergies  Allergen Reactions   Statins Other (See Comments)    Muscle weakness and pain   Buprenorphine Hcl Other (See Comments)    Patient questioned this entry   Buspirone Other (See Comments)    Weakness    Lexapro [Escitalopram] Other (See Comments)    "Muscle problems"   Lovastatin Other (See Comments)    Muscle pain   Meperidine Other (See Comments)    Demerol- questionable allergy- reaction not recalled   Morphine And Codeine Rash    History reviewed. No pertinent family history.  Prior to Admission medications   Medication Sig Start Date End Date Taking? Authorizing Provider  acetaminophen (TYLENOL) 500 MG tablet Take 500 mg by mouth every 6 (six) hours as needed (for pain).   Yes [provider]  amLODipine (NORVASC) 5 MG tablet Take 1 tablet (5 mg total) by mouth daily. 06/26/22  Yes Mauro Kaufmann  S, MD  BIOTIN PO Take 30 mLs by mouth daily. Medication: Mouthwash   Yes [provider]  Calcium Carb-Cholecalciferol (CALCIUM+D3 PO) Take 1 tablet by mouth daily with breakfast.   Yes [provider]  docusate sodium (COLACE) 100 MG capsule Take 1 capsule (100 mg total) by mouth 2 (two) times daily. 09/20/21  Yes Jerald Kief, MD  ibuprofen (ADVIL) 200 MG tablet Take 200 mg by mouth every 6 (six) hours as needed for moderate pain (pain score  4-6) or mild pain (pain score 1-3).   Yes [provider]  Multiple Vitamins-Minerals (PRESERVISION AREDS 2) CAPS Take 1 capsule by mouth in the morning and at bedtime.   Yes [provider]  pantoprazole (PROTONIX) 40 MG tablet Take 40 mg by mouth every morning. 04/27/23  Yes [provider]  SYSTANE ULTRA PF 0.4-0.3 % SOLN Place 1 drop into both eyes in the morning, at noon, in the evening, and at bedtime.   Yes [provider]    Physical Exam: Vitals:   05/10/23 1303 05/10/23 1500 05/10/23 1741  BP: (!) 155/73 (!) 131/58 (!) 155/66  Pulse: 87 80 80  Resp: 19 16 18   Temp: (!) 97.5 F (36.4 C)  98.3 F (36.8 C)  TempSrc: Oral  Oral  SpO2: 96% 96% 98%   General: Pleasant, well-appearing elderly woman laying in bed. No acute distress. HEENT: [place]/AT. Anicteric sclera CV: RRR. II/VI systolic murmur. No LE edema Pulmonary: Lungs CTAB. Normal effort. No wheezing or rales. Abdominal: Soft, nontender, nondistended. Normal bowel sounds. MSK: Mild ttp of the left hip with shortening of the LLE.  Palpable pedal pulses. Skin: Warm and dry.  Neuro: A&Ox3. Moves all extremities. Normal sensation to light touch. No focal deficit. Psych: Normal mood and affect  Data Reviewed: Labs show WBC 8.5, Hgb 12.6, platelet 147, sodium 132, K+ 4.1, creatinine 0.62, TSH 2.06, PT/INR 13.2/1.0, mag 2.6, Phos 3.6. CT head with no acute intracranial abnormality. Hip x-ray shows acute intratrochanteric fracture of the left femur Left femur x-ray shows mildly displaced intratrochanteric fracture of the left femur EKG pending  Assessment and Plan: AURIELLA Davidson is a 89 y.o. female with medical history significant of HTN, HLD, Sjogren's syndrome, chronic back pain, compression fracture s/p kyphoplasty, mitral and tricuspid regurg, HFpEF and GERD who presents from her ALF, [facility], after an unwitnessed fall and admitted for left hip fracture.  # Mechanical fall # Left femur  fracture Elderly patient with chronic back pain, compression fracture and likely undiagnosed osteoporosis presented after an unwitnessed mechanical fall and found to have closed displaced intratrochanteric fracture of the left femur. Pain is well-controlled. Plan for OR with orthopedic surgery tomorrow morning. -Admit to MedSurg -Orthopedic surgery following, intramedullary nail femur tomorrow morning -As needed Tylenol and IV Dilaudid for pain -Continue calcium-vitamin D supplement, multivitamin -Follow-up vitamin D levels -Fall precautions -N.p.o. at midnight -Consult PT/OT after surgery  # HTN BP elevated with SBP in the 130s to 150 in the setting of her hip pain. -Continue home amlodipine  # HFpEF # Mitral valve regurg # Tricuspid valve regurg Patient euvolemic on exam. Last TTE in 1/24 showed EF 60-65%, moderate asymmetric LVH, G1DD, moderate PASP, mild to moderate mitral valve regurgitation and moderate tricuspid valve regurgitation. Systolic murmur present on exam. No evidence of CHF exacerbation.  # Chronic back pain # Hx compression fracture s/p kyphoplasty Pain control with as needed Tylenol and IV Dilaudid -Follow-up vitamin D levels  # Sjogren's syndrome  Reports significant dry mouth and and decreased tears -Artificial tears 3 times daily -Oral care as needed  # GERD -Continue Protonix   Advance Care Planning:   Code Status: Limited: Do not attempt resuscitation (DNR) -DNR-LIMITED -Do Not Intubate/DNI    Consults: Orthopedic surgery  Family Communication: No family at bedside  Severity of Illness: The appropriate patient status for this patient is INPATIENT. Inpatient status is judged to be reasonable and necessary in order to provide the required intensity of service to ensure the patient's safety. The patient's presenting symptoms, physical exam findings, and initial radiographic and laboratory data in the context of their chronic comorbidities is felt to place  them at high risk for further clinical deterioration. Furthermore, it is not anticipated that the patient will be medically stable for discharge from the hospital within 2 midnights of admission.   * I certify that at the point of admission it is my clinical judgment that the patient will require inpatient hospital care spanning beyond 2 midnights from the point of admission due to high intensity of service, high risk for further deterioration and high frequency of surveillance required.*  Author: Steffanie Rainwater, MD 05/10/2023 6:46 PM  For on call review [email or web address].

## 2023-05-10 NOTE — ED Triage Notes (Signed)
EMS reports from [facility], unwitnessed fall in hallway, staff found Pt sitting, denies LOC, head strike, blood thinners or obvious injuries. Skinned left elbow skin tear and c/o left hip pain.  BP 182/88 HR 86 RR 16 Sp02 95 RA

## 2023-05-10 NOTE — Plan of Care (Signed)
  Problem: Education: Goal: Knowledge of General Education information will improve Description: Including pain rating scale, medication(s)/side effects and non-pharmacologic comfort measures Outcome: Progressing   Problem: Coping: Goal: Level of anxiety will decrease Outcome: Progressing   Problem: Pain Management: Goal: General experience of comfort will improve Outcome: Progressing   Problem: Safety: Goal: Ability to remain free from injury will improve Outcome: Progressing

## 2023-05-11 ENCOUNTER — Inpatient Hospital Stay (HOSPITAL_COMMUNITY): Payer: Self-pay | Admitting: Anesthesiology

## 2023-05-11 ENCOUNTER — Encounter (HOSPITAL_COMMUNITY)
Admission: EM | Disposition: A | Discharge status: Skilled Nursing Facility | Payer: Self-pay | Source: Skilled Nursing Facility | Attending: Internal Medicine

## 2023-05-11 ENCOUNTER — Other Ambulatory Visit: Payer: Self-pay

## 2023-05-11 ENCOUNTER — Inpatient Hospital Stay (HOSPITAL_COMMUNITY): Payer: Medicare HMO

## 2023-05-11 DIAGNOSIS — S72142A Displaced intertrochanteric fracture of left femur, initial encounter for closed fracture: Secondary | ICD-10-CM | POA: Diagnosis not present

## 2023-05-11 DIAGNOSIS — S72102A Unspecified trochanteric fracture of left femur, initial encounter for closed fracture: Secondary | ICD-10-CM | POA: Diagnosis not present

## 2023-05-11 HISTORY — PX: FEMUR IM NAIL: SHX1597

## 2023-05-11 LAB — SURGICAL PCR SCREEN
MRSA, PCR: NEGATIVE
Staphylococcus aureus: POSITIVE — AB

## 2023-05-11 SURGERY — INSERTION, INTRAMEDULLARY ROD, FEMUR
Anesthesia: General | Site: Hip | Laterality: Left

## 2023-05-11 MED ORDER — LIDOCAINE HCL (PF) 2 % IJ SOLN
INTRAMUSCULAR | Status: AC
  Filled 2023-05-11: qty 5

## 2023-05-11 MED ORDER — PROPOFOL 10 MG/ML IV BOLUS
INTRAVENOUS | Status: AC
  Filled 2023-05-11: qty 20

## 2023-05-11 MED ORDER — PROPOFOL 10 MG/ML IV BOLUS
INTRAVENOUS | Status: DC | PRN
  Administered 2023-05-11: 50 mg via INTRAVENOUS

## 2023-05-11 MED ORDER — ONDANSETRON HCL 4 MG/2ML IJ SOLN
INTRAMUSCULAR | Status: AC
  Filled 2023-05-11: qty 2

## 2023-05-11 MED ORDER — CEFAZOLIN SODIUM-DEXTROSE 2-4 GM/100ML-% IV SOLN
INTRAVENOUS | Status: AC
  Filled 2023-05-11: qty 100

## 2023-05-11 MED ORDER — DEXAMETHASONE SODIUM PHOSPHATE 10 MG/ML IJ SOLN
INTRAMUSCULAR | Status: AC
  Filled 2023-05-11: qty 1

## 2023-05-11 MED ORDER — ROCURONIUM BROMIDE 10 MG/ML (PF) SYRINGE
PREFILLED_SYRINGE | INTRAVENOUS | Status: AC
  Filled 2023-05-11: qty 10

## 2023-05-11 MED ORDER — EPHEDRINE SULFATE-NACL 50-0.9 MG/10ML-% IV SOSY
PREFILLED_SYRINGE | INTRAVENOUS | Status: DC | PRN
  Administered 2023-05-11 (×3): 5 mg via INTRAVENOUS
  Administered 2023-05-11: 10 mg via INTRAVENOUS

## 2023-05-11 MED ORDER — ALBUMIN HUMAN 5 % IV SOLN
INTRAVENOUS | Status: AC
  Filled 2023-05-11: qty 250

## 2023-05-11 MED ORDER — 0.9 % SODIUM CHLORIDE (POUR BTL) OPTIME
TOPICAL | Status: DC | PRN
  Administered 2023-05-11: 1000 mL

## 2023-05-11 MED ORDER — CEFAZOLIN SODIUM-DEXTROSE 1-4 GM/50ML-% IV SOLN
1.0000 g | Freq: Three times a day (TID) | INTRAVENOUS | Status: AC
  Administered 2023-05-11 (×2): 1 g via INTRAVENOUS
  Filled 2023-05-11 (×2): qty 50

## 2023-05-11 MED ORDER — DEXAMETHASONE SODIUM PHOSPHATE 10 MG/ML IJ SOLN
INTRAMUSCULAR | Status: DC | PRN
  Administered 2023-05-11: 10 mg via INTRAVENOUS

## 2023-05-11 MED ORDER — SUGAMMADEX SODIUM 200 MG/2ML IV SOLN
INTRAVENOUS | Status: DC | PRN
  Administered 2023-05-11: 200 mg via INTRAVENOUS

## 2023-05-11 MED ORDER — FENTANYL CITRATE (PF) 100 MCG/2ML IJ SOLN
INTRAMUSCULAR | Status: AC
  Filled 2023-05-11: qty 2

## 2023-05-11 MED ORDER — ACETAMINOPHEN 10 MG/ML IV SOLN
INTRAVENOUS | Status: DC | PRN
  Administered 2023-05-11: 1000 mg via INTRAVENOUS

## 2023-05-11 MED ORDER — MUPIROCIN 2 % EX OINT
1.0000 | TOPICAL_OINTMENT | Freq: Two times a day (BID) | CUTANEOUS | Status: AC
  Administered 2023-05-11 – 2023-05-16 (×10): 1 via NASAL
  Filled 2023-05-11 (×3): qty 22

## 2023-05-11 MED ORDER — PHENYLEPHRINE 80 MCG/ML (10ML) SYRINGE FOR IV PUSH (FOR BLOOD PRESSURE SUPPORT)
PREFILLED_SYRINGE | INTRAVENOUS | Status: AC
  Filled 2023-05-11: qty 10

## 2023-05-11 MED ORDER — ROCURONIUM BROMIDE 100 MG/10ML IV SOLN
INTRAVENOUS | Status: DC | PRN
  Administered 2023-05-11: 50 mg via INTRAVENOUS

## 2023-05-11 MED ORDER — PHENYLEPHRINE HCL-NACL 20-0.9 MG/250ML-% IV SOLN
INTRAVENOUS | Status: DC | PRN
  Administered 2023-05-11: 20 ug/min via INTRAVENOUS

## 2023-05-11 MED ORDER — PHENYLEPHRINE 80 MCG/ML (10ML) SYRINGE FOR IV PUSH (FOR BLOOD PRESSURE SUPPORT)
PREFILLED_SYRINGE | INTRAVENOUS | Status: DC | PRN
  Administered 2023-05-11 (×2): 160 ug via INTRAVENOUS
  Administered 2023-05-11: 80 ug via INTRAVENOUS
  Administered 2023-05-11: 240 ug via INTRAVENOUS
  Administered 2023-05-11: 160 ug via INTRAVENOUS

## 2023-05-11 MED ORDER — ALBUMIN HUMAN 5 % IV SOLN: INTRAVENOUS | Status: DC | PRN

## 2023-05-11 MED ORDER — ACETAMINOPHEN 10 MG/ML IV SOLN
INTRAVENOUS | Status: AC
  Filled 2023-05-11: qty 100

## 2023-05-11 MED ORDER — CHLORHEXIDINE GLUCONATE CLOTH 2 % EX PADS
6.0000 | MEDICATED_PAD | Freq: Every day | CUTANEOUS | Status: DC
  Administered 2023-05-12: 6 via TOPICAL

## 2023-05-11 MED ORDER — LIDOCAINE HCL (CARDIAC) PF 100 MG/5ML IV SOSY
PREFILLED_SYRINGE | INTRAVENOUS | Status: DC | PRN
  Administered 2023-05-11: 100 mg via INTRAVENOUS

## 2023-05-11 MED ORDER — FENTANYL CITRATE PF 50 MCG/ML IJ SOSY: 25.0000 ug | PREFILLED_SYRINGE | INTRAMUSCULAR | Status: DC | PRN

## 2023-05-11 MED ORDER — CEFAZOLIN SODIUM-DEXTROSE 2-4 GM/100ML-% IV SOLN
2.0000 g | INTRAVENOUS | Status: AC
  Administered 2023-05-11: 2 g via INTRAVENOUS

## 2023-05-11 MED ORDER — FENTANYL CITRATE (PF) 100 MCG/2ML IJ SOLN
INTRAMUSCULAR | Status: DC | PRN
  Administered 2023-05-11: 50 ug via INTRAVENOUS

## 2023-05-11 MED ORDER — LACTATED RINGERS IV SOLN: INTRAVENOUS | Status: DC | PRN

## 2023-05-11 MED ORDER — ONDANSETRON HCL 4 MG/2ML IJ SOLN
INTRAMUSCULAR | Status: DC | PRN
  Administered 2023-05-11: 4 mg via INTRAVENOUS

## 2023-05-11 MED ORDER — EPHEDRINE 5 MG/ML INJ
INTRAVENOUS | Status: AC
  Filled 2023-05-11: qty 5

## 2023-05-11 SURGICAL SUPPLY — 38 items
BAG COUNTER SPONGE SURGICOUNT (BAG) IMPLANT
BAG ZIPLOCK 12X15 (MISCELLANEOUS) IMPLANT
BIT DRILL CANN LG 4.3MM (BIT) IMPLANT
BNDG COHESIVE 6X5 TAN ST LF (GAUZE/BANDAGES/DRESSINGS) ×2 IMPLANT
BNDG GAUZE DERMACEA FLUFF 4 (GAUZE/BANDAGES/DRESSINGS) ×1 IMPLANT
CHLORAPREP W/TINT 26 (MISCELLANEOUS) ×1 IMPLANT
COVER PERINEAL POST (MISCELLANEOUS) ×1 IMPLANT
COVER SURGICAL LIGHT HANDLE (MISCELLANEOUS) ×1 IMPLANT
DRAPE C-ARM 42X120 X-RAY (DRAPES) ×1 IMPLANT
DRAPE C-ARMOR (DRAPES) ×1 IMPLANT
DRAPE IMP U-DRAPE 54X76 (DRAPES) ×2 IMPLANT
DRAPE INCISE IOBAN 85X60 (DRAPES) ×1 IMPLANT
DRAPE SHEET LG 3/4 BI-LAMINATE (DRAPES) ×1 IMPLANT
DRAPE STERI IOBAN 125X83 (DRAPES) ×1 IMPLANT
DRAPE U-SHAPE 47X51 STRL (DRAPES) ×2 IMPLANT
DRILL BIT CANN LG 4.3MM (BIT) ×1 IMPLANT
DRSG AQUACEL AG ADV 3.5X 4 (GAUZE/BANDAGES/DRESSINGS) IMPLANT
DRSG AQUACEL AG ADV 3.5X 6 (GAUZE/BANDAGES/DRESSINGS) IMPLANT
ELECT REM PT RETURN 15FT ADLT (MISCELLANEOUS) ×1 IMPLANT
GLOVE BIO SURGEON STRL SZ7.5 (GLOVE) ×1 IMPLANT
GLOVE BIOGEL PI IND STRL 7.5 (GLOVE) ×1 IMPLANT
GOWN STRL REUS W/ TWL LRG LVL3 (GOWN DISPOSABLE) ×1 IMPLANT
GUIDEPIN VERSANAIL DSP 3.2X444 (ORTHOPEDIC DISPOSABLE SUPPLIES) IMPLANT
GUIDEWIRE BALL NOSE 80CM (WIRE) IMPLANT
KIT BASIN OR (CUSTOM PROCEDURE TRAY) ×1 IMPLANT
KIT TURNOVER KIT A (KITS) IMPLANT
MANIFOLD NEPTUNE II (INSTRUMENTS) ×1 IMPLANT
NAIL HIP FRACT 130D 9X180 (Orthopedic Implant) IMPLANT
PACK GENERAL/GYN (CUSTOM PROCEDURE TRAY) ×1 IMPLANT
PAD ARMBOARD 7.5X6 YLW CONV (MISCELLANEOUS) ×1 IMPLANT
PROTECTOR NERVE ULNAR (MISCELLANEOUS) IMPLANT
SCREW BONE CORTICAL 5.0X30 (Screw) IMPLANT
SCREW CANN THRD AFF 10.5X100 (Screw) IMPLANT
STAPLER VISISTAT (STAPLE) ×1 IMPLANT
SUT ETHILON 2 0 PS N (SUTURE) ×1 IMPLANT
SUT VIC AB 0 CT1 36 (SUTURE) ×1 IMPLANT
SUT VIC AB 2-0 CT1 TAPERPNT 27 (SUTURE) ×1 IMPLANT
TOWEL OR 17X26 10 PK STRL BLUE (TOWEL DISPOSABLE) ×1 IMPLANT

## 2023-05-11 NOTE — Consult Note (Signed)
Patient ID: Lauren Davidson MRN: [id number] DOB/AGE: [birth year] 89 y.o.  Admit date: 05/10/2023  Admission Diagnoses:  Principal Problem:   Closed displaced intertrochanteric fracture of left femur, initial encounter (HCC) Active Problems:   Sjogren's syndrome (HCC)   HPI: Ortho consult for Left hip pertrochanteric femur fracture .  PMH notable for HTN, HLD, Sjogren's syndrome, chronic back pain, compression fracture s/p kyphoplasty, mitral and tricuspid regurg, HFpEF and GERD.  Denies numbness/tingling.  History provided in part by caregivers via phone and chart review.  Past Medical History: Past Medical History:  Diagnosis Date   Hyperlipidemia     Surgical History: Past Surgical History:  Procedure Laterality Date   IR KYPHO LUMBAR INC FX REDUCE BONE BX UNI/BIL CANNULATION INC/IMAGING  06/20/2022   IR KYPHO LUMBAR INC FX REDUCE BONE BX UNI/BIL CANNULATION INC/IMAGING  09/26/2022   IR KYPHO THORACIC WITH BONE BIOPSY  09/26/2022    Family History: History reviewed. No pertinent family history.  Social History: Social History   Socioeconomic History   Marital status: Divorced    Spouse name: Not on file   Number of children: Not on file   Years of education: Not on file   Highest education level: Not on file  Occupational History   Not on file  Tobacco Use   Smoking status: Never   Smokeless tobacco: Never  Vaping Use   Vaping status: Never Used  Substance and Sexual Activity   Alcohol use: Never   Drug use: Never   Sexual activity: Not on file  Other Topics Concern   Not on file  Social History Narrative   Not on file   Social Drivers of Health   Financial Resource Strain: Not on file  Food Insecurity: No Food Insecurity (05/10/2023)   Hunger Vital Sign    Worried About Running Out of Food in the Last Year: Never true    Ran Out of Food in the Last Year: Never true  Transportation Needs: No Transportation Needs (05/10/2023)   PRAPARE -  Administrator, Civil Service (Medical): No    Lack of Transportation (Non-Medical): No  Physical Activity: Not on file  Stress: Not on file  Social Connections: Not on file  Intimate Partner Violence: Not At Risk (05/10/2023)   Humiliation, Afraid, Rape, and Kick questionnaire    Fear of Current or Ex-Partner: No    Emotionally Abused: No    Physically Abused: No    Sexually Abused: No    Allergies: Statins, Buprenorphine hcl, Buspirone, Lexapro [escitalopram], Lovastatin, Meperidine, and Morphine and codeine  Medications: I have reviewed the patient's current medications.  Vital Signs: Patient Vitals for the past 24 hrs:  BP Temp Temp src Pulse Resp SpO2 Height Weight  05/11/23 1501 (!) 115/58 97.9 F (36.6 C) Oral 78 14 98 % -- --  05/11/23 1103 (!) 119/59 -- -- 85 16 97 % -- --  05/11/23 1045 (!) 116/50 -- -- 84 (!) 25 97 % -- --  05/11/23 1030 108/72 -- -- 86 17 98 % -- --  05/11/23 1015 113/63 (!) 97.4 F (36.3 C) -- 79 18 100 % -- --  05/11/23 1000 (!) 116/50 -- -- 73 15 100 % -- --  05/11/23 0945 (!) 108/44 -- -- 76 17 100 % -- --  05/11/23 0939 (!) 121/47 (!) 97.5 F (36.4 C) -- -- 18 100 % -- --  05/11/23 0704 -- -- -- -- -- 98 % -- --  05/11/23  0700 (!) 144/56 99 F (37.2 C) -- 85 17 (!) 85 % -- --  05/11/23 0544 132/67 99 F (37.2 C) Oral 85 17 92 % -- --  05/11/23 0151 135/71 98.2 F (36.8 C) Oral 78 15 92 % -- --  05/10/23 2212 124/65 98 F (36.7 C) Oral 75 17 100 % -- --  05/10/23 2036 -- -- -- -- -- -- 5\' 4"  (1.626 m) 53.6 kg  05/10/23 1741 (!) 155/66 98.3 F (36.8 C) Oral 80 18 98 % -- --    Radiology: DG FEMUR MIN 2 VIEWS LEFT Result Date: 05/11/2023 CLINICAL DATA:  Fluoro guidance provided EXAM: LEFT FEMUR 2 VIEWS FINDINGS: Dose: Cumulative Air Kerma 201.3mG y Fluoro time 145s IMPRESSION: C-arm fluoro guidance provided. Electronically Signed   By: Layla Maw M.D.   On: 05/11/2023 11:33   DG C-Arm 1-60 Min-No Report Result Date:  05/11/2023 Fluoroscopy was utilized by the requesting physician.  No radiographic interpretation.   DG C-Arm 1-60 Min-No Report Result Date: 05/11/2023 Fluoroscopy was utilized by the requesting physician.  No radiographic interpretation.   DG FEMUR MIN 2 VIEWS LEFT Result Date: 05/10/2023 CLINICAL DATA:  Left femur fracture. EXAM: LEFT FEMUR 2 VIEWS COMPARISON:  Left hip radiograph dated 05/10/2023. FINDINGS: Evaluation is limited due to osteopenia and body habitus. Mildly displaced intertrochanteric fracture of the left femur. No dislocation. The bones are osteopenic. The soft tissues are unremarkable. IMPRESSION: Mildly displaced intertrochanteric fracture of the left femur. Electronically Signed   By: Elgie Collard M.D.   On: 05/10/2023 15:27   CT Head Wo Contrast Result Date: 05/10/2023 CLINICAL DATA:  Head trauma.  Unwitnessed fall. EXAM: CT HEAD WITHOUT CONTRAST TECHNIQUE: Contiguous axial images were obtained from the base of the skull through the vertex without intravenous contrast. RADIATION DOSE REDUCTION: This exam was performed according to the departmental dose-optimization program which includes automated exposure control, adjustment of the mA and/or kV according to patient size and/or use of iterative reconstruction technique. COMPARISON:  07/30/2022 FINDINGS: Brain: There is no evidence for acute hemorrhage, hydrocephalus, mass lesion, or abnormal extra-axial fluid collection. No definite CT evidence for acute infarction. Diffuse loss of parenchymal volume is consistent with atrophy. Patchy low attenuation in the deep hemispheric and periventricular white matter is nonspecific, but likely reflects chronic microvascular ischemic demyelination. Vascular: No hyperdense vessel or unexpected calcification. Skull: No evidence for fracture. No worrisome lytic or sclerotic lesion. Sinuses/Orbits: The visualized paranasal sinuses and mastoid air cells are clear. Other: None. IMPRESSION: 1. No  acute intracranial abnormality. 2. Atrophy with chronic small vessel ischemic disease. Electronically Signed   By: Kennith Center M.D.   On: 05/10/2023 14:10   DG HIP UNILAT WITH PELVIS 2-3 VIEWS LEFT Result Date: 05/10/2023 CLINICAL DATA:  Fall with hip pain. EXAM: DG HIP (WITH OR WITHOUT PELVIS) 2-3V LEFT COMPARISON:  Hip radiographs dated 07/18/2022. FINDINGS: There is an acute fracture of the intertrochanteric region of the left femur. No hip joint dislocation. Degenerative changes are seen in the spine. IMPRESSION: Acute intertrochanteric fracture of the left femur. Electronically Signed   By: Romona Curls M.D.   On: 05/10/2023 14:03    Labs: Recent Labs    05/10/23 1415  WBC 8.5  RBC 3.79*  HCT 37.7  PLT 147*   Recent Labs    05/10/23 1415  NA 132*  K 4.1  CL 98  CO2 23  BUN 19  CREATININE 0.62  GLUCOSE 104*  CALCIUM 8.8*   Recent Labs  05/10/23 1753  INR 1.0    Review of Systems: ROS as detailed in HPI  Physical Exam: Body mass index is 20.28 kg/m.  Physical Exam   Gen: AAOx3, NAD Comfortable at rest  Left Lower Extremity: Skin intact TTP over prox femur ADF/APF/EHL 5/5 SILT throughout DP, PT 2+ to palp CR < 2s   Assessment and Plan: Ortho consult for Left hip pertrochanteric femur fracture  -history, exam and imaging reviewed at length with patient/family -plan for ORIF left femur today -plan for gradual WBAT with assistive device and 1-2 person support postop -NPO and held VTE ppx since midnight -PT/OT postop  Netta Cedars, MD Orthopaedic Surgeon [facility] [phone]  The risks and benefits were presented and reviewed. The risks due to hardware failure/irritation, new/persistent/recurrent infection, stiffness, nerve/vessel/tendon injury, nonunion/malunion of any fracture, wound healing issues, allograft usage, development of arthritis, failure of this surgery, possibility of external fixation in certain situations, possibility  of delayed definitive surgery, need for further surgery, prolonged wound care including further soft tissue coverage procedures, thromboembolic events, anesthesia/medical complications/events perioperatively and beyond, amputation, death among others were discussed. The patient acknowledged the explanation, agreed to proceed with the plan and a consent was signed.

## 2023-05-11 NOTE — Anesthesia Preprocedure Evaluation (Addendum)
Anesthesia Evaluation  Patient identified by MRN, date of birth, ID band Patient confused    Reviewed: Allergy & Precautions, NPO status , Patient's Chart, lab work & pertinent test results  Airway Mallampati: II  TM Distance: >3 FB Neck ROM: Full    Dental no notable dental hx.    Pulmonary neg pulmonary ROS   Pulmonary exam normal        Cardiovascular hypertension, Pt. on medications  Rhythm:Regular Rate:Normal     Neuro/Psych   Anxiety     negative neurological ROS     GI/Hepatic Neg liver ROS,GERD  ,,  Endo/Other  negative endocrine ROS    Renal/GU   negative genitourinary   Musculoskeletal Hip fracture    Abdominal Normal abdominal exam  (+)   Peds  Hematology Lab Results      Component                Value               Date                      WBC                      8.5                 05/10/2023                HGB                      12.6                05/10/2023                HCT                      37.7                05/10/2023                MCV                      99.5                05/10/2023                PLT                      147 (L)             05/10/2023              Anesthesia Other Findings   Reproductive/Obstetrics                             Anesthesia Physical Anesthesia Plan  ASA: 2  Anesthesia Plan: General   Post-op Pain Management: Tylenol PO (pre-op)*   Induction: Intravenous  PONV Risk Score and Plan: 3 and Ondansetron, Dexamethasone and Treatment may vary due to age or medical condition  Airway Management Planned: Mask and Oral ETT  Additional Equipment: None  Intra-op Plan:   Post-operative Plan: Extubation in OR  Informed Consent: I have reviewed the patients History and Physical, chart, labs and discussed the procedure including the risks, benefits and alternatives for the proposed anesthesia with the patient or authorized  representative who has indicated  his/her understanding and acceptance.     Dental advisory given and Consent reviewed with POA  Plan Discussed with: CRNA  Anesthesia Plan Comments:        Anesthesia Quick Evaluation

## 2023-05-11 NOTE — Transfer of Care (Signed)
Immediate Anesthesia Transfer of Care Note  Patient: Lauren Davidson  Procedure(s) Performed: INTRAMEDULLARY (IM) NAIL FEMORAL (Left: Hip)  Patient Location: PACU  Anesthesia Type:General  Level of Consciousness: oriented, patient cooperative, and responds to stimulation  Airway & Oxygen Therapy: Patient Spontanous Breathing and Patient connected to nasal cannula oxygen  Post-op Assessment: Report given to RN and Post -op Vital signs reviewed and stable  Post vital signs: Reviewed and stable  Last Vitals:  Vitals Value Taken Time  BP 121/47 05/11/23 0939  Temp 97.4   Pulse 77 05/11/23 0942  Resp 18 05/11/23 0942  SpO2 100 % 05/11/23 0942  Vitals shown include unfiled device data.  Last Pain:  Vitals:   05/11/23 0613  TempSrc:   PainSc: 9       Patients Stated Pain Goal: 2 (05/11/23 2130)  Complications: No notable events documented.

## 2023-05-11 NOTE — Progress Notes (Signed)
  [facility] Progress Note  Patient: Lauren Davidson     YNW:[id number]  DOA: 05/10/2023   PCP: Merlene Laughter, MD (Inactive)       Brief hospital course: This is a 89 year old female with Sjogren's syndrome, hypertension, chronic back pain, HFpEF who presented from assisted living after an unwitnessed fall.  She stated that she slipped and fell onto her left side while walking with her walker.  She complained of left hip pain and was brought to the ED. In the ED, she was found to have a left intertrochanteric fracture.  Subjective:  Left hip pain better after surgery.   Assessment and Plan: Principal Problem:   Closed displaced intertrochanteric fracture of left femur, initial encounter  -Status post ORIF and intramedullary nail   Active Problems:   Sjogren's syndrome (HCC)  Hypertension - Amlodipine        Code Status: Limited: Do not attempt resuscitation (DNR) -DNR-LIMITED -Do Not Intubate/DNI  Total time on patient care: 35 minutes DVT prophylaxis: Per Ortho  Objective:   Vitals:   05/11/23 1015 05/11/23 1030 05/11/23 1045 05/11/23 1103  BP: 113/63 108/72 (!) 116/50 (!) 119/59  Pulse: 79 86 84 85  Resp: 18 17 (!) 25 16  Temp: (!) 97.4 F (36.3 C)     TempSrc:      SpO2: 100% 98% 97% 97%  Weight:      Height:       Filed Weights   05/10/23 2036  Weight: 53.6 kg   Exam: General exam: Appears comfortable  HEENT: oral mucosa moist Respiratory system: Clear to auscultation.  Cardiovascular system: S1 & S2 heard  Gastrointestinal system: Abdomen soft, non-tender, nondistended. Normal bowel sounds   Extremities: No cyanosis, clubbing or edema Psychiatry:  Mood & affect appropriate.      CBC: Recent Labs  Lab 05/10/23 1415  WBC 8.5  NEUTROABS 6.4  HGB 12.6  HCT 37.7  MCV 99.5  PLT 147*   Basic Metabolic Panel: Recent Labs  Lab 05/10/23 1415 05/10/23 1753  NA 132*  --   K 4.1  --   CL 98  --   CO2 23  --   GLUCOSE 104*  --   BUN 19   --   CREATININE 0.62  --   CALCIUM 8.8*  --   MG  --  2.6*  PHOS  --  3.6     Scheduled Meds:  amLODipine  5 mg Oral Daily   calcium-vitamin D  1 tablet Oral Q breakfast   docusate sodium  100 mg Oral BID   enoxaparin (LOVENOX) injection  40 mg Subcutaneous Q24H   multivitamin  1 tablet Oral Daily   pantoprazole  40 mg Oral q morning   polyvinyl alcohol  1 drop Both Eyes TID    Imaging and lab data personally reviewed   Author: Calvert Cantor  05/11/2023 1:51 PM  To contact [facility]>   Check the care team in [facility] and look for the attending/consulting [facility] provider listed  Log into [email or web address] and use [place]'s universal password   Go to> "[facility]"  and find provider  If you still have difficulty reaching the provider, please page the [facility] (Director on Call) for the Hospitalists listed on amion

## 2023-05-11 NOTE — Discharge Instructions (Addendum)
Lauren Cedars, MD [facility]  Follow Up Call my office at [phone] when you are discharged from the hospital or surgery center to schedule an appointment to be seen 7-10 days after surgery.  Call my office at [phone] if you develop a fever >101.5 F, nausea, vomiting, bleeding from the surgical site or severe pain.

## 2023-05-11 NOTE — H&P (Addendum)
H&P Update:  -History and Physical Reviewed  -Patient has been re-examined  -No change in the plan of care  -The risks and benefits were presented and reviewed. The risks due to hardware/suture failure and/or irritation, new/persistent infection, stiffness, nerve/vessel/tendon injury or rerupture of repaired tendon, nonunion/malunion, allograft usage, wound healing issues, development of arthritis, failure of this surgery, possibility of external fixation with delayed definitive surgery, need for further surgery, thromboembolic events, anesthesia/medical complications, amputation, death among others were discussed. The patient's POAs [name] and her daughter acknowledged the explanation, agreed to proceed with the plan and a consent was signed in the presence of nursing colleagues.  Lauren Davidson

## 2023-05-11 NOTE — Op Note (Signed)
05/11/2023  9:41 AM   PATIENT: Lauren Davidson  89 y.o. female  MRN: [id number]   PRE-OPERATIVE DIAGNOSIS:   Left hip pertrochanteric femur fracture   POST-OPERATIVE DIAGNOSIS:   Left hip pertrochanteric femur fracture   PROCEDURE: ORIF left pertrochanteric femur fracture with intramedullary nail   SURGEON:  Netta Cedars, MD   ASSISTANT: None   ANESTHESIA: General, regional   EBL: Minimal   TOURNIQUET:   None   COMPLICATIONS: None apparent   DISPOSITION: Extubated, awake and stable to recovery.   INDICATION FOR PROCEDURE: The patient presented with above diagnosis.  We discussed the diagnosis, alternative treatment options, risks and benefits of the above surgical intervention, as well as alternative non-operative treatments. All questions/concerns were addressed and the patient/family demonstrated appropriate understanding of the diagnosis, the procedure, the postoperative course, and overall prognosis. The patient wished to proceed with surgical intervention and signed an informed surgical consent as such, in each others presence prior to surgery.   PROCEDURE IN DETAIL: After preoperative consent was obtained and the correct operative site was identified, the patient was brought to the operating room supine on stretcher and general anesthesia was induced.  The patient was then transferred onto operating Hana table with well-padded ipsilateral boot and contralateral well leg holder. Preoperative antibiotics. The operative lower extremity was prepped and draped in standard sterile fashion.   Reduction was obtained on the Hana table using ligamentotaxis, and confirmed on orthogonal AP and lateral fluoroscopic views.  Bony anatomy was marked out on the lateral thigh, including the tip of the trochanter. Incision extending proximally from the trochanteric starting point was marked on the skin.  Skin was incised sharply.  Underlying subcutaneous fat tissues  dissected down onto the fascial layer.  Fascia was incised sharply in line with fibers.  Underlying muscle was bluntly divided in line with fibers, exposing the trochanteric starting point.  The starting wire was positioned at the trochanteric starting point, and advanced down the femur and intramedullary position confirmed on orthogonal AP and lateral fluoroscopic views. Extensive comminution was noted around the superior aspect of the greater trochanter at the intended site of the preferred starting point. The wire was noted to be mobile even prior to drilling with entry reamer. The proximal femur was cannulated at the trochanteric starting point while holding everything in place to prevent displacement of starting point.  Starting wire and entry reamer were withdrawn.  A ball tipped guide wire was placed and verified to be within bone at all points. A short nail [employer] Aff[employer]us 180 mm length size 9 diameter long intramedullary antegrade femoral nail was advanced down the femur, impacted into an appropriate position.  The nail was inserted under fluoroscopy throughout to ensure it was within bone at all times. Intramedullary position was confirmed on orthogonal AP and lateral views.     The incision site for the head lag screw was noted on the skin, and skin was incised sharply with a long knife, followed by sharp incision and the underlying subcutaneous tissue and IT band down on the lateral cortex of the femur.  Guidewire for the head screw was advanced up the neck along the calcar and in a central position up into the proximal head, avoiding subchondral penetration. Once again, there was noted to be significant toggling of the nail given her extensive fracture comminution and the nail/reduction were held manually until the lag screw was fixed. Appropriate guidewire position was confirmed on orthogonal AP and lateral fluoroscopic views.  Measurement for the head screw was obtained.  Head screw track was  drilled with a cannulated drill and a 100 mm length head screw was advanced along the guidewire. This screw was statically locked. Through separate distal incisions, the distal interlock was drilled, measured, and placed through the nail.   All instrumentation was removed and final x-rays were obtained, demonstrating stable alignment of the reduced comminuted peritrochanteric fracture with appropriate position of the cephalomedullary nail implant, confirmed on orthogonal AP and lateral fluoroscopic views.     The surgical sites were thoroughly irrigated. The tourniquet was deflated and hemostasis achieved. The deep layers and the subcutaneous tissues closed using vicryl. The skin was closed without tension using staples.    The operative limb was cleaned with saline and sterile Aquacel dressings were applied. The patient was awakened from anesthesia and transported to the recovery room in stable condition.     FOLLOW UP PLAN: -transfer to PACU, then return to RNF -gradual progression of WBAT with walker and 1-2 person assist -PT/OT consults, will benefit from SNF/rehab postop -IV abx for surgical prophylaxis postop (Ancef) -DVT ppx per primary team -follow up as outpatient in 7-10 days for wound check (contact [phone]) -sutures out in 2-3 weeks in outpatient office     RADIOGRAPHS: AP, lateral, oblique radiographs of the left femur were obtained intraoperatively. These showed interval reduction and fixation of the fractures. All hardware is appropriately positioned and of the appropriate lengths. No other acute injuries are noted.     Netta Cedars Orthopaedic Surgery [facility]

## 2023-05-11 NOTE — Anesthesia Postprocedure Evaluation (Signed)
Anesthesia Post Note  Patient: Lauren Davidson  Procedure(s) Performed: INTRAMEDULLARY (IM) NAIL FEMORAL (Left: Hip)     Patient location during evaluation: PACU Anesthesia Type: General Level of consciousness: awake and alert Pain management: pain level controlled Vital Signs Assessment: post-procedure vital signs reviewed and stable Respiratory status: spontaneous breathing, nonlabored ventilation, respiratory function stable and patient connected to nasal cannula oxygen Cardiovascular status: blood pressure returned to baseline and stable Postop Assessment: no apparent nausea or vomiting Anesthetic complications: no   No notable events documented.  Last Vitals:  Vitals:   05/11/23 1045 05/11/23 1103  BP: (!) 116/50 (!) 119/59  Pulse: 84 85  Resp: (!) 25 16  Temp:    SpO2: 97% 97%    Last Pain:  Vitals:   05/11/23 1045  TempSrc:   PainSc: Asleep                 Earl Lites P Ricard Faulkner

## 2023-05-11 NOTE — Anesthesia Procedure Notes (Signed)
Procedure Name: Intubation Date/Time: 05/11/2023 7:53 AM  Performed by: Burgess Estelle, CRNAPre-anesthesia Checklist: Patient identified, Emergency Drugs available, Suction available and Patient being monitored Patient Re-evaluated:Patient Re-evaluated prior to induction Oxygen Delivery Method: Circle system utilized Preoxygenation: Pre-oxygenation with 100% oxygen Induction Type: IV induction Ventilation: Mask ventilation without difficulty Laryngoscope Size: Mac and 3 Grade View: Grade II Tube type: Oral Tube size: 7.0 mm Number of attempts: 1 Airway Equipment and Method: Stylet and Oral airway Placement Confirmation: ETT inserted through vocal cords under direct vision, positive ETCO2 and breath sounds checked- equal and bilateral Secured at: 22 cm Tube secured with: Tape Dental Injury: Teeth and Oropharynx as per pre-operative assessment

## 2023-05-11 NOTE — Progress Notes (Signed)
OT Cancellation Note  Patient Details Name: Lauren Davidson MRN: [id number] DOB: 1921-06-29   Cancelled Treatment:    Reason Eval/Treat Not Completed: Patient at procedure or test/ unavailable Patient is off hall at procedure. OT to continue to follow and check back as schedule will allow.  Rosalio Loud, MS Acute Rehabilitation Department Office# [phone]  05/11/2023, 10:44 AM

## 2023-05-12 ENCOUNTER — Encounter (HOSPITAL_COMMUNITY): Payer: Self-pay | Admitting: Orthopaedic Surgery

## 2023-05-12 DIAGNOSIS — S72142A Displaced intertrochanteric fracture of left femur, initial encounter for closed fracture: Secondary | ICD-10-CM | POA: Diagnosis not present

## 2023-05-12 LAB — RETICULOCYTES
Immature Retic Fract: 12.6 % (ref 2.3–15.9)
RBC.: 2.49 MIL/uL — ABNORMAL LOW (ref 3.87–5.11)
Retic Count, Absolute: 62.3 10*3/uL (ref 19.0–186.0)
Retic Ct Pct: 2.5 % (ref 0.4–3.1)

## 2023-05-12 LAB — IRON AND TIBC
Iron: 24 ug/dL — ABNORMAL LOW (ref 28–170)
Saturation Ratios: 8 % — ABNORMAL LOW (ref 10.4–31.8)
TIBC: 308 ug/dL (ref 250–450)
UIBC: 284 ug/dL

## 2023-05-12 LAB — CBC
HCT: 23.1 % — ABNORMAL LOW (ref 36.0–46.0)
HCT: 25.1 % — ABNORMAL LOW (ref 36.0–46.0)
Hemoglobin: 7.8 g/dL — ABNORMAL LOW (ref 12.0–15.0)
Hemoglobin: 8.4 g/dL — ABNORMAL LOW (ref 12.0–15.0)
MCH: 34.2 pg — ABNORMAL HIGH (ref 26.0–34.0)
MCH: 33.9 pg (ref 26.0–34.0)
MCHC: 33.8 g/dL (ref 30.0–36.0)
MCHC: 33.5 g/dL (ref 30.0–36.0)
MCV: 101.3 fL — ABNORMAL HIGH (ref 80.0–100.0)
MCV: 101.2 fL — ABNORMAL HIGH (ref 80.0–100.0)
Platelets: 109 10*3/uL — ABNORMAL LOW (ref 150–400)
Platelets: 126 10*3/uL — ABNORMAL LOW (ref 150–400)
RBC: 2.28 MIL/uL — ABNORMAL LOW (ref 3.87–5.11)
RBC: 2.48 MIL/uL — ABNORMAL LOW (ref 3.87–5.11)
RDW: 13.4 % (ref 11.5–15.5)
RDW: 13.4 % (ref 11.5–15.5)
WBC: 6.5 10*3/uL (ref 4.0–10.5)
WBC: 6.7 10*3/uL (ref 4.0–10.5)
nRBC: 0 % (ref 0.0–0.2)
nRBC: 0 % (ref 0.0–0.2)

## 2023-05-12 LAB — FERRITIN: Ferritin: 55 ng/mL (ref 11–307)

## 2023-05-12 LAB — FOLATE: Folate: 14 ng/mL (ref >5.9)

## 2023-05-12 LAB — TYPE AND SCREEN
ABO/RH(D): O NEG
Antibody Screen: NEGATIVE

## 2023-05-12 LAB — ABO/RH: ABO/RH(D): O NEG

## 2023-05-12 MED ORDER — SODIUM CHLORIDE 0.9% IV SOLUTION: Freq: Once | INTRAVENOUS | Status: DC

## 2023-05-12 NOTE — Progress Notes (Signed)
[facility] Progress Note  Patient: Lauren Davidson     YNW:[id number]  DOA: 05/10/2023   PCP: Merlene Laughter, MD (Inactive)       Brief hospital course: This is a 89 year old female with Sjogren's syndrome, hypertension, chronic back pain, HFpEF who presented from assisted living after an unwitnessed fall.  She stated that she slipped and fell onto her left side while walking with her walker.  She complained of left hip pain and was brought to the ED. In the ED, she was found to have a left intertrochanteric fracture.  Subjective:  No complaints other than food related. We discussed her low Hgb. She has a number of questions.  Assessment and Plan: Principal Problem:   Closed displaced intertrochanteric fracture of left femur, initial encounter  -Status post ORIF and intramedullary nail - She will need a SNF   Active Problems: Acute blood loss anemia - Hgb 12.6> 8.4 - not orthostatic - follow    Sjogren's syndrome (HCC)  Hypertension - Amlodipine        Code Status: Limited: Do not attempt resuscitation (DNR) -DNR-LIMITED -Do Not Intubate/DNI  Total time on patient care: 35 minutes DVT prophylaxis: Per Ortho- Lovenox  Objective:   Vitals:   05/11/23 2158 05/12/23 0151 05/12/23 0529 05/12/23 1003  BP: (!) 110/50 (!) 127/56 (!) 114/52 (!) 107/49  Pulse: 78 81 73 73  Resp: 15 17 16 17   Temp: 98.7 F (37.1 C) 98.4 F (36.9 C) (!) 97.4 F (36.3 C) 98.3 F (36.8 C)  TempSrc: Oral Oral Oral   SpO2: 98% 99% 99% 97%  Weight:      Height:       Filed Weights   05/10/23 2036  Weight: 53.6 kg   Exam: General exam: Appears comfortable  HEENT: oral mucosa moist Respiratory system: Clear to auscultation.  Cardiovascular system: S1 & S2 heard  Gastrointestinal system: Abdomen soft, non-tender, nondistended. Normal bowel sounds   Extremities: No cyanosis, clubbing or edema Psychiatry:  Mood & affect appropriate.       CBC: Recent Labs  Lab  05/10/23 1415 05/12/23 0705 05/12/23 0914  WBC 8.5 6.5 6.7  NEUTROABS 6.4  --   --   HGB 12.6 7.8* 8.4*  HCT 37.7 23.1* 25.1*  MCV 99.5 101.3* 101.2*  PLT 147* 109* 126*   Basic Metabolic Panel: Recent Labs  Lab 05/10/23 1415 05/10/23 1753  NA 132*  --   K 4.1  --   CL 98  --   CO2 23  --   GLUCOSE 104*  --   BUN 19  --   CREATININE 0.62  --   CALCIUM 8.8*  --   MG  --  2.6*  PHOS  --  3.6     Scheduled Meds:  sodium chloride   Intravenous Once   amLODipine  5 mg Oral Daily   calcium-vitamin D  1 tablet Oral Q breakfast   Chlorhexidine Gluconate Cloth  6 each Topical Daily   docusate sodium  100 mg Oral BID   enoxaparin (LOVENOX) injection  40 mg Subcutaneous Q24H   multivitamin  1 tablet Oral Daily   mupirocin ointment  1 Application Nasal BID   pantoprazole  40 mg Oral q morning   polyvinyl alcohol  1 drop Both Eyes TID    Imaging and lab data personally reviewed   Author: Calvert Cantor  05/12/2023 1:06 PM  To contact [facility]>   Check the care team in [facility] and look  for the attending/consulting [facility] provider listed  Log into [email or web address] and use [place]'s universal password   Go to> "[facility]"  and find provider  If you still have difficulty reaching the provider, please page the [facility] (Director on Call) for the Hospitalists listed on amion

## 2023-05-12 NOTE — Evaluation (Signed)
Physical Therapy Evaluation Patient Details Name: Lauren Davidson MRN: [id number] DOB: Jan 09, 1922 Today's Date: 05/12/2023  History of Present Illness  89 year old female who presented from assisted living after an unwitnessed fall. Dx of L IT femur fx, s/p ORIF 05/11/23. Pt with h/o  Sjogren's syndrome, hypertension, chronic back pain, HFpEF, T12 & L1 kyphoplasty May 2024.  Clinical Impression  Pt admitted with above diagnosis. Max assist for supine to sit, mod assist for sit to stand, min assist to pivot to recliner. At baseline pt ambulates independently with a rollator, she denies other falls in the past 6 months. Patient will benefit from continued inpatient follow up therapy, <3 hours/day.  Pt currently with functional limitations due to the deficits listed below (see PT Problem List). Pt will benefit from acute skilled PT to increase their independence and safety with mobility to allow discharge.           If plan is discharge home, recommend the following: A lot of help with walking and/or transfers;A lot of help with bathing/dressing/bathroom;Assistance with cooking/housework;Assist for transportation;Help with stairs or ramp for entrance   Can travel by private vehicle   No    Equipment Recommendations Rolling walker (2 wheels)  Recommendations for Other Services       Functional Status Assessment Patient has had a recent decline in their functional status and demonstrates the ability to make significant improvements in function in a reasonable and predictable amount of time.     Precautions / Restrictions Precautions Precautions: Fall Precaution Comments: fell just PTA, denies h/o other falls in past 6 months Restrictions Weight Bearing Restrictions Per Provider Order: No      Mobility  Bed Mobility Overal bed mobility: Needs Assistance Bed Mobility: Supine to Sit     Supine to sit: Max assist, HOB elevated     General bed mobility comments: assist to advance  LLE, raise trunk and pivot hips    Transfers Overall transfer level: Needs assistance Equipment used: Rolling walker (2 wheels) Transfers: Sit to/from Stand, Bed to chair/wheelchair/BSC Sit to Stand: Mod assist, +2 safety/equipment   Step pivot transfers: Min assist       General transfer comment: assist to power up, VCs hand placement; assist to guide hips into recliner, VC to fully back up to recliner    Ambulation/Gait                  Stairs            Wheelchair Mobility     Tilt Bed    Modified Rankin (Stroke Patients Only)       Balance                                             Pertinent Vitals/Pain Pain Assessment Pain Assessment: Faces Faces Pain Scale: Hurts even more Pain Location: L hip with activity Pain Descriptors / Indicators: Grimacing, Operative site guarding Pain Intervention(s): Limited activity within patient's tolerance, Monitored during session, Premedicated before session, Ice applied, Repositioned    Home Living Family/patient expects to be discharged to:: Skilled nursing facility                   Additional Comments: from [facility]    Prior Function Prior Level of Function : Needs assist             Mobility Comments:  amb with rollator, able to walk to dining room at ALF, no other falls in past 6 monts ADLs Comments: some assistance for bathing/dressing     Extremity/Trunk Assessment   Upper Extremity Assessment Upper Extremity Assessment: Overall WFL for tasks assessed    Lower Extremity Assessment Lower Extremity Assessment: LLE deficits/detail;Difficult to assess due to impaired cognition LLE: Unable to fully assess due to pain LLE Sensation: WNL    Cervical / Trunk Assessment Cervical / Trunk Assessment: Kyphotic  Communication   Communication Communication: Hearing impairment  Cognition Arousal: Alert Behavior During Therapy: WFL for tasks  assessed/performed Overall Cognitive Status: Within Functional Limits for tasks assessed                                          General Comments      Exercises     Assessment/Plan    PT Assessment Patient needs continued PT services  PT Problem List Decreased strength;Decreased activity tolerance;Decreased mobility;Decreased balance       PT Treatment Interventions Gait training;Therapeutic exercise;Therapeutic activities;Functional mobility training;Patient/family education;DME instruction    PT Goals (Current goals can be found in the Care Plan section)  Acute Rehab PT Goals Patient Stated Goal: to walk PT Goal Formulation: With patient Time For Goal Achievement: 05/26/23 Potential to Achieve Goals: Good    Frequency Min 1X/week     Co-evaluation               AM-PAC PT "6 Clicks" Mobility  Outcome Measure Help needed turning from your back to your side while in a flat bed without using bedrails?: A Lot Help needed moving from lying on your back to sitting on the side of a flat bed without using bedrails?: A Lot Help needed moving to and from a bed to a chair (including a wheelchair)?: A Lot Help needed standing up from a chair using your arms (e.g., wheelchair or bedside chair)?: A Lot Help needed to walk in hospital room?: Total Help needed climbing 3-5 steps with a railing? : Total 6 Click Score: 10    End of Session Equipment Utilized During Treatment: Gait belt Activity Tolerance: Patient limited by fatigue;Patient limited by pain Patient left: in chair;with call bell/phone within reach;with chair alarm set Nurse Communication: Mobility status PT Visit Diagnosis: Difficulty in walking, not elsewhere classified (R26.2);Pain Pain - Right/Left: Left Pain - part of body: Hip    Time: 1205-1230 PT Time Calculation (min) (ACUTE ONLY): 25 min   Charges:   PT Evaluation $PT Eval Moderate Complexity: 1 Mod PT Treatments $Therapeutic  Activity: 8-22 mins PT General Charges $$ ACUTE PT VISIT: 1 Visit        Tamala Ser PT 05/12/2023  Acute Rehabilitation Services  Office [phone]

## 2023-05-12 NOTE — Progress Notes (Signed)
OT Cancellation Note  Patient Details Name: Lauren Davidson MRN: [id number] DOB: 20-Feb-1922   Cancelled Treatment:    Reason Eval/Treat Not Completed: Other (comment) Patient is in bed eating breakfast for extended period of time with patient reporting " I keep getting inturrupted" patient is also pending unit of PRBCs. OT to continue to follow and check back as schedule will allow.  Rosalio Loud, MS Acute Rehabilitation Department Office# [phone]  05/12/2023, 10:11 AM

## 2023-05-12 NOTE — TOC Initial Note (Signed)
Transition of Care [facility]) - Initial/Assessment Note   Patient Details  Name: Lauren Davidson MRN: [id number] Date of Birth: 22-Aug-1921  Transition of Care [facility]) CM/SW Contact:    Ewing Schlein, LCSW Phone Number: 05/12/2023, 3:25 PM  Clinical Narrative: PT evaluation recommended SNF. Patient and son-in-law agreeable to CSW starting bed search. FL2 done; PASRR confirmed. Initial referral faxed out. TOC awaiting bed offers.                 Expected Discharge Plan: Skilled Nursing Facility Barriers to Discharge: Continued Medical Work up, SNF Pending bed offer, Insurance Authorization  Patient Goals and CMS Choice Patient states their goals for this hospitalization and ongoing recovery are:: Go to rehab CMS Medicare.gov Compare Post Acute Care list provided to:: Patient Choice offered to / list presented to : Patient  Expected Discharge Plan and Services In-house Referral: Clinical Social Work Post Acute Care Choice: Skilled Nursing Facility Living arrangements for the past 2 months: Assisted Living Facility       DME Arranged: N/A DME Agency: NA  Prior Living Arrangements/Services Living arrangements for the past 2 months: Assisted Living Facility Lives with:: Facility Resident Patient language and need for interpreter reviewed:: Yes Do you feel safe going back to the place where you live?: Yes      Need for Family Participation in Patient Care: Yes (Comment) Care giver support system in place?: Yes (comment) Criminal Activity/Legal Involvement Pertinent to Current Situation/Hospitalization: No - Comment as needed  Activities of Daily Living ADL Screening (condition at time of admission) Independently performs ADLs?: No Does the patient have a NEW difficulty with bathing/dressing/toileting/self-feeding that is expected to last >3 days?: No Does the patient have a NEW difficulty with getting in/out of bed, walking, or climbing stairs that is expected to last >3 days?: No Does the  patient have a NEW difficulty with communication that is expected to last >3 days?: No Is the patient deaf or have difficulty hearing?: Yes Does the patient have difficulty seeing, even when wearing glasses/contacts?: Yes Does the patient have difficulty concentrating, remembering, or making decisions?: No  Permission Sought/Granted Permission sought to share information with : Facility Industrial/product designer granted to share information with : Yes, Verbal Permission Granted Permission granted to share info w AGENCY: SNFs  Emotional Assessment Attitude/Demeanor/Rapport: Engaged Affect (typically observed): Accepting Orientation: : Oriented to Self, Oriented to Place, Oriented to  Time, Oriented to Situation Alcohol / Substance Use: Not Applicable Psych Involvement: No (comment)  Admission diagnosis:  Closed displaced intertrochanteric fracture of left femur, initial encounter [facility]) [S72.142A] Patient Active Problem List   Diagnosis Date Noted   Closed displaced intertrochanteric fracture of left femur, initial encounter (HCC) 05/10/2023   Closed compression fracture of L2 lumbar vertebra, initial encounter (HCC) 06/16/2022   Lumbar compression fracture (HCC) 06/12/2022   Back pain 09/17/2021   Closed compression fracture of L4 lumbar vertebra, initial encounter (HCC) 09/17/2021   Lumbar spinal stenosis 09/17/2021   HTN (hypertension) 09/17/2021   HLD (hyperlipidemia) 09/17/2021   Gastroesophageal reflux disease without esophagitis 03/01/2019   Sjogren's syndrome (HCC) 05/18/2015   Chronic kidney disease (CKD), stage II (mild) 05/18/2015   Generalized anxiety disorder 05/18/2015   Osteoporosis 05/18/2015   Status post intraocular lens implant 11/14/2011   PCP:  Merlene Laughter, MD (Inactive) Pharmacy:   [facility] [phone] [place], [place] - [address] [place] [address] Phone: [phone] Fax: [phone]  [id number] -  [place], [place] - [phone]  [address] [place] [address] Phone: [phone] Fax: [phone]  Social Drivers of Health (SDOH) Social History: SDOH Screenings   Food Insecurity: No Food Insecurity (05/10/2023)  Housing: Low Risk  (05/10/2023)  Transportation Needs: No Transportation Needs (05/10/2023)  Utilities: Not At Risk (05/10/2023)  Tobacco Use: Low Risk  (05/10/2023)   SDOH Interventions:    Readmission Risk Interventions     No data to display

## 2023-05-12 NOTE — [place] FL2 (Signed)
[place] MEDICAID FL2 LEVEL OF CARE FORM     IDENTIFICATION  Patient Name: Lauren Davidson Birthdate: 26-Jul-1921 Sex: female Admission Date (Current Location): 05/10/2023  [facility] and [place] Number:  Producer, television/film/video and Address:  [facility],  [address] [place], [place] [address]      Provider Number: [id number]  Attending Physician Name and Address:  Calvert Cantor, MD  Relative Name and Phone Number:  Letisia Menning (son) Ph: [phone]    Current Level of Care: Hospital Recommended Level of Care: Skilled Nursing Facility Prior Approval Number:    Date Approved/Denied:   PASRR Number: [id number]  Discharge Plan: SNF    Current Diagnoses: Patient Active Problem List   Diagnosis Date Noted   Closed displaced intertrochanteric fracture of left femur, initial encounter (HCC) 05/10/2023   Closed compression fracture of L2 lumbar vertebra, initial encounter (HCC) 06/16/2022   Lumbar compression fracture (HCC) 06/12/2022   Back pain 09/17/2021   Closed compression fracture of L4 lumbar vertebra, initial encounter (HCC) 09/17/2021   Lumbar spinal stenosis 09/17/2021   HTN (hypertension) 09/17/2021   HLD (hyperlipidemia) 09/17/2021   Gastroesophageal reflux disease without esophagitis 03/01/2019   Sjogren's syndrome (HCC) 05/18/2015   Chronic kidney disease (CKD), stage II (mild) 05/18/2015   Generalized anxiety disorder 05/18/2015   Osteoporosis 05/18/2015   Status post intraocular lens implant 11/14/2011    Orientation RESPIRATION BLADDER Height & Weight     Self, Time, Situation, Place  O2 (2L/min) Continent Weight: 118 lb 2.7 oz (53.6 kg) Height:  5\' 4"  (162.6 cm)  BEHAVIORAL SYMPTOMS/MOOD NEUROLOGICAL BOWEL NUTRITION STATUS      Continent Diet (Regular diet)  AMBULATORY STATUS COMMUNICATION OF NEEDS Skin   Extensive Assist Verbally Surgical wounds, Other (Comment) (Erythema: bilateral arms, buttocks)                       Personal Care  Assistance Level of Assistance  Bathing, Feeding, Dressing Bathing Assistance: Limited assistance Feeding assistance: Independent Dressing Assistance: Limited assistance     Functional Limitations Info  Sight, Hearing, Speech Sight Info: Impaired Hearing Info: Impaired Speech Info: Adequate    SPECIAL CARE FACTORS FREQUENCY  PT (By licensed PT), OT (By licensed OT)     PT Frequency: 5x's/week OT Frequency: 5x's/week            Contractures Contractures Info: Not present    Additional Factors Info  Code Status, Allergies Code Status Info: DNR Allergies Info: Statins, Buprenorphine Hcl, Buspirone, Lexapro (Escitalopram), Lovastatin, Meperidine, Morphine And Codeine           Current Medications (05/12/2023):  This is the current hospital active medication list Current Facility-Administered Medications  Medication Dose Route Frequency Provider Last Rate Last Admin   0.9 %  sodium chloride infusion (Manually program via Guardrails IV Fluids)   Intravenous Once Calvert Cantor, MD       acetaminophen (TYLENOL) tablet 650 mg  650 mg Oral Q6H PRN Steffanie Rainwater, MD   650 mg at 05/12/23 0839   amLODipine (NORVASC) tablet 5 mg  5 mg Oral Daily Steffanie Rainwater, MD       calcium-vitamin D (OSCAL WITH D) 500-5 MG-MCG per tablet 1 tablet  1 tablet Oral Q breakfast Steffanie Rainwater, MD   1 tablet at 05/12/23 2952   Chlorhexidine Gluconate Cloth 2 % PADS 6 each  6 each Topical Daily Calvert Cantor, MD   6 each at 05/12/23 [id number]  docusate sodium (COLACE) capsule 100 mg  100 mg Oral BID Steffanie Rainwater, MD   100 mg at 05/12/23 0840   enoxaparin (LOVENOX) injection 40 mg  40 mg Subcutaneous Q24H Steffanie Rainwater, MD   40 mg at 05/11/23 2115   HYDROmorphone (DILAUDID) injection 0.5 mg  0.5 mg Intravenous Q6H PRN Steffanie Rainwater, MD   0.5 mg at 05/11/23 0615   multivitamin (PROSIGHT) tablet 1 tablet  1 tablet Oral Daily Steffanie Rainwater, MD   1 tablet at 05/12/23 9528    mupirocin ointment (BACTROBAN) 2 % 1 Application  1 Application Nasal BID Calvert Cantor, MD   1 Application at 05/12/23 0840   ondansetron (ZOFRAN) injection 4 mg  4 mg Intravenous Q8H PRN Steffanie Rainwater, MD       Or   ondansetron (ZOFRAN) tablet 4 mg  4 mg Oral Q8H PRN Steffanie Rainwater, MD       pantoprazole (PROTONIX) EC tablet 40 mg  40 mg Oral q morning Steffanie Rainwater, MD   40 mg at 05/12/23 0840   polyvinyl alcohol (LIQUIFILM TEARS) 1.4 % ophthalmic solution 1 drop  1 drop Both Eyes TID Steffanie Rainwater, MD   1 drop at 05/12/23 0840   senna-docusate (Senokot-S) tablet 1 tablet  1 tablet Oral QHS PRN Steffanie Rainwater, MD         Discharge Medications: Please see discharge summary for a list of discharge medications.  Relevant Imaging Results:  Relevant Lab Results:   Additional Information SSN: [id number]  Ewing Schlein, LCSW

## 2023-05-13 DIAGNOSIS — S72142A Displaced intertrochanteric fracture of left femur, initial encounter for closed fracture: Secondary | ICD-10-CM | POA: Diagnosis not present

## 2023-05-13 LAB — VITAMIN B12: Vitamin B-12: 361 pg/mL (ref 180–914)

## 2023-05-13 MED ORDER — FERROUS GLUCONATE 324 (38 FE) MG PO TABS
324.0000 mg | ORAL_TABLET | Freq: Every day | ORAL | Status: DC
  Administered 2023-05-13 – 2023-05-16 (×4): 324 mg via ORAL
  Filled 2023-05-13 (×4): qty 1

## 2023-05-13 NOTE — TOC Progression Note (Signed)
Transition of Care [facility]) - Progression Note    Patient Details  Name: Lauren Davidson MRN: [id number] Date of Birth: 02-27-1922  [facility] of [facility]) CM/SW Contact  Darleene Cleaver, [place] Phone Number: 05/13/2023, 5:43 PM  Clinical Narrative:     Patient from [facility].  Contact at ALF is Audry Pili, [phone].  TOC waiting for bed offers for patient.  Expected Discharge Plan: Skilled Nursing Facility Barriers to Discharge: Continued Medical Work up, SNF Pending bed offer, English as a second language teacher  Expected Discharge Plan and Services In-house Referral: Clinical Social Work   Post Acute Care Choice: Skilled Nursing Facility Living arrangements for the past 2 months: Assisted Living Facility                 DME Arranged: N/A DME Agency: NA                   Social Determinants of Health (SDOH) Interventions SDOH Screenings   Food Insecurity: No Food Insecurity (05/10/2023)  Housing: Low Risk  (05/10/2023)  Transportation Needs: No Transportation Needs (05/10/2023)  Utilities: Not At Risk (05/10/2023)  Tobacco Use: Low Risk  (05/10/2023)    Readmission Risk Interventions     No data to display

## 2023-05-13 NOTE — Evaluation (Signed)
Occupational Therapy Evaluation Patient Details Name: Lauren Davidson MRN: [id number] DOB: 1921/11/10 Today's Date: 05/13/2023   History of Present Illness patient is a 89 year old female who presented from assisted living after an unwitnessed fall. Dx of L IT femur fx, s/p ORIF 05/11/23. Pt with h/o  Sjogren's syndrome, hypertension, chronic back pain, HFpEF, T12 & L1 kyphoplasty May 2024.   Clinical Impression   Patient is a 89 year old female who was admitted for above. Patient was living at ALF with some assistance for LB Dressing at rollator level. Currently, patient is +2 for transfers with STEDY used with strong posterior leaning/pushing with standing. Patient reporting minimal pain but positional responses to movement would indicate more pain. Nurse made aware. Patient was noted to have decreased functional activity tolernace, decreased ROM, decreased BUE strength, decreased endurance, decreased sitting balance, decreased standing balanced, decreased safety awareness, and decreased knowledge of AE/AD impacting participation in ADLs. Patient would continue to benefit from skilled OT services at this time while admitted and after d/c to address noted deficits in order to improve overall safety and independence in ADLs. Patient will benefit from continued inpatient follow up therapy, <3 hours/day.       If plan is discharge home, recommend the following: Two people to help with walking and/or transfers;Two people to help with bathing/dressing/bathroom;Assistance with cooking/housework;Direct supervision/assist for medications management;Assist for transportation;Help with stairs or ramp for entrance;Direct supervision/assist for financial management    Functional Status Assessment  Patient has had a recent decline in their functional status and demonstrates the ability to make significant improvements in function in a reasonable and predictable amount of time.  Equipment Recommendations  None  recommended by OT    Recommendations for Other Services       Precautions / Restrictions Precautions Precautions: Fall Restrictions Weight Bearing Restrictions Per Provider Order: No      Mobility Bed Mobility Overal bed mobility: Needs Assistance Bed Mobility: Supine to Sit     Supine to sit: Max assist, HOB elevated     General bed mobility comments: assist to advance LLE, raise trunk and pivot hips          Balance Overall balance assessment: Needs assistance Sitting-balance support: Bilateral upper extremity supported, Feet unsupported Sitting balance-Leahy Scale: Fair     Standing balance support: Bilateral upper extremity supported, Reliant on assistive device for balance, During functional activity Standing balance-Leahy Scale: Zero       ADL either performed or assessed with clinical judgement   ADL Overall ADL's : Needs assistance/impaired Eating/Feeding: Modified independent;Sitting Eating/Feeding Details (indicate cue type and reason): patient reporting dry mouth. Grooming: Sitting;Set up Grooming Details (indicate cue type and reason): in recliner Upper Body Bathing: Minimal assistance;Sitting   Lower Body Bathing: Total assistance;Sitting/lateral leans   Upper Body Dressing : Minimal assistance;Sitting   Lower Body Dressing: Sitting/lateral leans;Total assistance   Toilet Transfer: +2 for safety/equipment;+2 for physical assistance Toilet Transfer Details (indicate cue type and reason): stedy Toileting- Clothing Manipulation and Hygiene: +2 for safety/equipment;+2 for physical assistance;Sit to/from stand Toileting - Clothing Manipulation Details (indicate cue type and reason): needing increased a for posterior leaning>             Vision Baseline Vision/History: 1 Wears glasses Vision Assessment?: No apparent visual deficits            Pertinent Vitals/Pain Pain Assessment Pain Assessment: Faces Faces Pain Scale: Hurts whole  lot Pain Location: L hip with activity Pain Descriptors / Indicators: Grimacing, Operative  site guarding Pain Intervention(s): Limited activity within patient's tolerance, Monitored during session, Premedicated before session, Ice applied     Extremity/Trunk Assessment Upper Extremity Assessment Upper Extremity Assessment: Overall WFL for tasks assessed   Lower Extremity Assessment Lower Extremity Assessment: Defer to PT evaluation   Cervical / Trunk Assessment Cervical / Trunk Assessment: Kyphotic      Cognition Arousal: Alert Behavior During Therapy: WFL for tasks assessed/performed Overall Cognitive Status: Within Functional Limits for tasks assessed         General Comments  patient was 1 assistance with max A to transfer with RW from bed to reclienr with patient then reporting need to use bathroom. patient was +2 to use STEDY to transfer to [facility] and back with strong posterior pushing response.            Home Living Family/patient expects to be discharged to:: Skilled nursing facility                                 Additional Comments: from [facility]      Prior Functioning/Environment Prior Level of Function : Needs assist             Mobility Comments: amb with rollator, able to walk to dining room at ALF, no other falls in past 6 monts ADLs Comments: some assistance for bathing/dressing        OT Problem List: Decreased activity tolerance;Impaired balance (sitting and/or standing);Decreased coordination;Decreased knowledge of precautions;Decreased safety awareness;Cardiopulmonary status limiting activity;Pain;Decreased knowledge of use of DME or AE      OT Treatment/Interventions: Self-care/ADL training;Therapeutic exercise;DME and/or AE instruction;Therapeutic activities;Patient/family education;Balance training    OT Goals(Current goals can be found in the care plan section) Acute Rehab OT Goals Patient Stated Goal: to go to the  bathroom OT Goal Formulation: With patient Time For Goal Achievement: 05/27/23 Potential to Achieve Goals: Fair  OT Frequency: Min 1X/week       AM-PAC OT "6 Clicks" Daily Activity     Outcome Measure Help from another person eating meals?: A Little Help from another person taking care of personal grooming?: A Lot Help from another person toileting, which includes using toliet, bedpan, or urinal?: Total Help from another person bathing (including washing, rinsing, drying)?: Total Help from another person to put on and taking off regular upper body clothing?: A Lot Help from another person to put on and taking off regular lower body clothing?: Total 6 Click Score: 10   End of Session Equipment Utilized During Treatment: Gait belt;Other (comment);Rolling walker (2 wheels) (stedy) Nurse Communication: Mobility status  Activity Tolerance: Patient limited by pain Patient left: in chair;with call bell/phone within reach;with chair alarm set  OT Visit Diagnosis: Unsteadiness on feet (R26.81);Other abnormalities of gait and mobility (R26.89);Pain                Time: 5621-3086 OT Time Calculation (min): 45 min Charges:  OT General Charges $OT Visit: 1 Visit OT Evaluation $OT Eval Low Complexity: 1 Low OT Treatments $Self Care/Home Management : 23-37 mins  Erick Oxendine OTR/L, MS Acute Rehabilitation Department Office# [phone]   Selinda Flavin 05/13/2023, 12:00 PM

## 2023-05-13 NOTE — Progress Notes (Signed)
Physical Therapy Treatment Patient Details Name: Lauren Davidson MRN: [id number] DOB: 20-Nov-1921 Today's Date: 05/13/2023   History of Present Illness patient is a 89 year old female who presented from assisted living after an unwitnessed fall. Dx of L IT femur fx, s/p ORIF 05/11/23. Pt with h/o  Sjogren's syndrome, hypertension, chronic back pain, HFpEF, T12 & L1 kyphoplasty May 2024.    PT Comments  POD # 2 pm session Pt was back in bed with Daughter and Son in [place] visiting.  Assisted OOB to amb required + 2 assist.  General bed mobility comments: required Max Assist to transfer to EOB using bed pad to complete.  Once upright, pt was able to static sit at Supervision level.   General transfer comment: Using B Platform EVA walker, pt required + 2 Max Asisst to rise from elevated bed.  Poor forward flexed posture with hips and knees flexed.  Heavy lean on EVA walker for support. General Gait Details: Using EVA walker for increased support and + 2 Max Assist, Pt barely able to amb 6 feet.  Very short shuffled steps and narrow BOS. Pt required increased asisst back to bed then position to comfort.  Pt resides at an [employer] and was able to Indep amb with her Rollator.  Pt will need ST Rehab at SNF to address mobility and functional decline prior to safely returning.    If plan is discharge home, recommend the following: A lot of help with walking and/or transfers;A lot of help with bathing/dressing/bathroom;Assistance with cooking/housework;Assist for transportation;Help with stairs or ramp for entrance   Can travel by private vehicle     No  Equipment Recommendations  Other (comment) (TBD)    Recommendations for Other Services       Precautions / Restrictions Precautions Precautions: Fall Restrictions Weight Bearing Restrictions Per Provider Order: No Other Position/Activity Restrictions: WBAT     Mobility  Bed Mobility Overal bed mobility: Needs Assistance Bed  Mobility: Supine to Sit, Sit to Supine     Supine to sit: Max assist, +2 for physical assistance, +2 for safety/equipment Sit to supine: Max assist, +2 for physical assistance, +2 for safety/equipment   General bed mobility comments: required Max Assist to transfer to EOB using bed pad to complete.  Once upright, pt was able to static sit at Supervision level.  Pt required increased asisst back to bed then position to comfort.    Transfers Overall transfer level: Needs assistance Equipment used: Bilateral platform walker Transfers: Sit to/from Stand Sit to Stand: Max assist, +2 physical assistance, +2 safety/equipment           General transfer comment: Using B Platform EVA walker, pt required + 2 Max Asisst to rise from elevated bed.  Poor forward flexed posture with hips and knees flexed.  Heavy lean on EVA walker for support.    Ambulation/Gait   Gait Distance (Feet): 6 Feet Assistive device: Fara Boros Gait Pattern/deviations: Step-to pattern, Narrow base of support, Trunk flexed, Decreased step length - right, Decreased step length - left Gait velocity: decreased     General Gait Details: Using EVA walker for increased support and + 2 Max Assist, Pt barely able to amb 6 feet.  Very short shuffled steps and narrow BOS.   Stairs             Wheelchair Mobility     Tilt Bed    Modified Rankin (Stroke Patients Only)       Balance  Cognition Arousal: Alert Behavior During Therapy: WFL for tasks assessed/performed Overall Cognitive Status: Within Functional Limits for tasks assessed                                 General Comments: AxO x 3 pleasant Lady who resides at [facility] when she fell using her Rollator with an "issue" with her bifocals.        Exercises      General Comments        Pertinent Vitals/Pain Pain Assessment Pain Assessment: Faces Faces Pain  Scale: Hurts even more Pain Location: L hip with activity Pain Descriptors / Indicators: Grimacing, Operative site guarding Pain Intervention(s): Monitored during session, Premedicated before session, Repositioned, Ice applied    Home Living                          Prior Function            PT Goals (current goals can now be found in the care plan section) Progress towards PT goals: Progressing toward goals    Frequency    Min 1X/week      PT Plan      Co-evaluation              AM-PAC PT "6 Clicks" Mobility   Outcome Measure  Help needed turning from your back to your side while in a flat bed without using bedrails?: A Lot Help needed moving from lying on your back to sitting on the side of a flat bed without using bedrails?: A Lot Help needed moving to and from a bed to a chair (including a wheelchair)?: A Lot Help needed standing up from a chair using your arms (e.g., wheelchair or bedside chair)?: Total Help needed to walk in hospital room?: Total Help needed climbing 3-5 steps with a railing? : Total 6 Click Score: 9    End of Session Equipment Utilized During Treatment: Gait belt Activity Tolerance: Patient limited by fatigue;Patient limited by pain Patient left: in bed;with call bell/phone within reach;with bed alarm set;with family/visitor present Nurse Communication: Mobility status PT Visit Diagnosis: Difficulty in walking, not elsewhere classified (R26.2);Pain Pain - Right/Left: Left Pain - part of body: Hip     Time: 1535-1600 PT Time Calculation (min) (ACUTE ONLY): 25 min  Charges:    $Gait Training: 8-22 mins $Therapeutic Activity: 8-22 mins PT General Charges $$ ACUTE PT VISIT: 1 Visit                    Felecia Shelling  PTA Acute  Rehabilitation Services Office M-F          [phone]

## 2023-05-13 NOTE — Progress Notes (Signed)
Subjective: 2 Days Post-Op Procedure(s) (LRB): INTRAMEDULLARY (IM) NAIL FEMORAL (Left)  Patient reports pain as appropriately controlled. Denies any new numbness/tingling.   Objective:   VITALS:  Temp:  [98.3 F (36.8 C)-98.6 F (37 C)] 98.3 F (36.8 C) (12/24 0611) Pulse Rate:  [72-76] 73 (12/24 0611) Resp:  [17-20] 18 (12/24 0611) BP: (118-126)/(45-56) 126/56 (12/24 0611) SpO2:  [99 %] 99 % (12/24 9563)  Neurovascular intact Sensation intact distally Intact pulses distally Dorsiflexion/Plantar flexion intact Incision: dressing C/D/I Compartment soft   LABS Recent Labs    05/10/23 1415 05/12/23 0705 05/12/23 0914  HGB 12.6 7.8* 8.4*  WBC 8.5 6.5 6.7  PLT 147* 109* 126*   Recent Labs    05/10/23 1415  NA 132*  K 4.1  CL 98  CO2 23  BUN 19  CREATININE 0.62  GLUCOSE 104*   Recent Labs    05/10/23 1753  INR 1.0     Assessment/Plan: 2 Days Post-Op Procedure(s) (LRB): INTRAMEDULLARY (IM) NAIL FEMORAL (Left)  Advance diet Up with therapy Discharge to SNF Dispo per primary team I spoke with patient and daughter directly today and updated on plan of care  -gradual progression of WBAT with walker and 1-2 person assist -PT/OT consults, will benefit from SNF/rehab postop -IV abx for surgical prophylaxis postop (Ancef) -DVT ppx per primary team -follow up as outpatient in 7-10 days for wound check (contact [phone]) -sutures out in 2-3 weeks in outpatient office  Netta Cedars 05/13/2023, 1:17 PM

## 2023-05-13 NOTE — Progress Notes (Signed)
[facility] Progress Note  Patient: Lauren Davidson     WUJ:[id number]  DOA: 05/10/2023   PCP: Merlene Laughter, MD (Inactive)       Brief hospital course: This is a 89 year old female with Sjogren's syndrome, hypertension, chronic back pain, HFpEF who presented from assisted living after an unwitnessed fall.  She stated that she slipped and fell onto her left side while walking with her walker.  She complained of left hip pain and was brought to the ED. In the ED, she was found to have a left intertrochanteric fracture.  Subjective:  No complaints. She was able to get up to the chair yesterday and initially it was difficult to get out of bed.   Assessment and Plan: Principal Problem:   Closed displaced intertrochanteric fracture of left femur, initial encounter  -Status post ORIF and intramedullary nail - She will need a SNF   Active Problems: Acute blood loss anemia - Hgb 12.6> 8.4 - Low Iron saturation level > 8- start oral iron daily and then increase to BID in a few days if she tolerates it - not orthostatic - follow    Sjogren's syndrome (HCC)  Hypertension - Amlodipine        Code Status: Limited: Do not attempt resuscitation (DNR) -DNR-LIMITED -Do Not Intubate/DNI  Total time on patient care: 35 minutes DVT prophylaxis: Per Ortho- Lovenox  Objective:   Vitals:   05/12/23 1003 05/12/23 1341 05/12/23 2151 05/13/23 0611  BP: (!) 107/49 (!) 118/45 (!) 120/50 (!) 126/56  Pulse: 73 76 72 73  Resp: 17 17 20 18   Temp: 98.3 F (36.8 C) 98.6 F (37 C) 98.3 F (36.8 C) 98.3 F (36.8 C)  TempSrc:   Oral Oral  SpO2: 97% 99% 99% 99%  Weight:      Height:       Filed Weights   05/10/23 2036  Weight: 53.6 kg   Exam: General exam: Appears comfortable  HEENT: oral mucosa moist Respiratory system: Clear to auscultation.  Cardiovascular system: S1 & S2 heard  Gastrointestinal system: Abdomen soft, non-tender, nondistended. Normal bowel sounds   Extremities:  No cyanosis, clubbing or edema Psychiatry:  Mood & affect appropriate.   CBC: Recent Labs  Lab 05/10/23 1415 05/12/23 0705 05/12/23 0914  WBC 8.5 6.5 6.7  NEUTROABS 6.4  --   --   HGB 12.6 7.8* 8.4*  HCT 37.7 23.1* 25.1*  MCV 99.5 101.3* 101.2*  PLT 147* 109* 126*   Basic Metabolic Panel: Recent Labs  Lab 05/10/23 1415 05/10/23 1753  NA 132*  --   K 4.1  --   CL 98  --   CO2 23  --   GLUCOSE 104*  --   BUN 19  --   CREATININE 0.62  --   CALCIUM 8.8*  --   MG  --  2.6*  PHOS  --  3.6     Scheduled Meds:  sodium chloride   Intravenous Once   amLODipine  5 mg Oral Daily   calcium-vitamin D  1 tablet Oral Q breakfast   docusate sodium  100 mg Oral BID   enoxaparin (LOVENOX) injection  40 mg Subcutaneous Q24H   ferrous gluconate  324 mg Oral Q breakfast   multivitamin  1 tablet Oral Daily   mupirocin ointment  1 Application Nasal BID   pantoprazole  40 mg Oral q morning   polyvinyl alcohol  1 drop Both Eyes TID    Imaging and lab data personally  reviewed   Author: Calvert Cantor  05/13/2023 12:22 PM  To contact [facility]>   Check the care team in [facility] and look for the attending/consulting [facility] provider listed  Log into [email or web address] and use [place]'s universal password   Go to> "[facility]"  and find provider  If you still have difficulty reaching the provider, please page the [facility] (Director on Call) for the Hospitalists listed on amion

## 2023-05-14 DIAGNOSIS — S72142A Displaced intertrochanteric fracture of left femur, initial encounter for closed fracture: Secondary | ICD-10-CM | POA: Diagnosis not present

## 2023-05-14 LAB — CBC
HCT: 22.5 % — ABNORMAL LOW (ref 36.0–46.0)
Hemoglobin: 7.3 g/dL — ABNORMAL LOW (ref 12.0–15.0)
MCH: 33.8 pg (ref 26.0–34.0)
MCHC: 32.4 g/dL (ref 30.0–36.0)
MCV: 104.2 fL — ABNORMAL HIGH (ref 80.0–100.0)
Platelets: 131 K/uL — ABNORMAL LOW (ref 150–400)
RBC: 2.16 MIL/uL — ABNORMAL LOW (ref 3.87–5.11)
RDW: 13.6 % (ref 11.5–15.5)
WBC: 6.4 K/uL (ref 4.0–10.5)
nRBC: 0 % (ref 0.0–0.2)

## 2023-05-14 MED ORDER — SALINE SPRAY 0.65 % NA SOLN
1.0000 | NASAL | Status: DC | PRN
  Administered 2023-05-14 – 2023-05-15 (×2): 1 via NASAL
  Filled 2023-05-14: qty 44

## 2023-05-14 NOTE — Progress Notes (Signed)
  Progress Note   Patient: Lauren Davidson UJW:[id number] DOB: 23-Aug-1921 DOA: 05/10/2023     4 DOS: the patient was seen and examined on 05/14/2023   Brief hospital course:  This is a 89 year old female with Sjogren's syndrome, hypertension, chronic back pain, HFpEF who presented from assisted living after an unwitnessed fall.  She stated that she slipped and fell onto her left side while walking with her walker.  She complained of left hip pain and was brought to the ED. In the ED, she was found to have a left intertrochanteric fracture.   Assessment and Plan:  Subjective:  No complaints. She was able to get up to the chair yesterday and initially it was difficult to get out of bed.    Assessment and Plan: Principal Problem:   Closed displaced intertrochanteric fracture of left femur, initial encounter  -Status post ORIF and intramedullary nail Day 3 - She will need a SNF (gradual progression of WBAT with walker and 1-2 person assist) -Follow-up with orthopedic surgery in 7 to 10 days for wound check   Active Problems: Acute blood loss anemia - Hgb 12.6> 8.4 >> 7.3 - Low Iron saturation level > 8- start oral iron daily and then increase to BID in a few days if she tolerates it - not orthostatic - follow -- Transfuse for Hb < 7     Sjogren's syndrome (HCC)   Hypertension - Amlodipine     Hypoxia ?? Atelectasis Incentive spirometry CXR       Code Status: Limited: Do not attempt resuscitation (DNR) -DNR-LIMITED -Do Not Intubate/DNI  DVT prophylaxis: Per Ortho- Lovenox          Subjective: Sitting up in a recliner. Noted to have episode of hypoxia over night.  Physical Exam: Vitals:   05/12/23 2151 05/13/23 0611 05/13/23 2154 05/14/23 0631  BP: (!) 120/50 (!) 126/56 (!) 141/57 136/85  Pulse: 72 73 83 85  Resp: 20 18 14    Temp: 98.3 F (36.8 C) 98.3 F (36.8 C) 98.2 F (36.8 C) 98.3 F (36.8 C)  TempSrc: Oral Oral Oral Axillary  SpO2: 99% 99% 93% 95%   Weight:      Height:       General exam: Appears comfortable  HEENT: oral mucosa moist Respiratory system: Clear to auscultation.  Cardiovascular system: S1 & S2 heard  Gastrointestinal system: Abdomen soft, non-tender, nondistended. Normal bowel sounds   Extremities: No cyanosis, clubbing or edema Psychiatry:  Mood & affect appropriate.       Data Reviewed: Hb 7.3 There are no new results to review at this time.  Family Communication: Plan of care discussed with patient  Disposition: Status is: Inpatient Remains inpatient appropriate because: Awaiting discharge  Planned Discharge Destination: Skilled nursing facility    Time spent: 35 minutes  Author: Lucile Shutters, MD 05/14/2023 9:12 AM  For on call review [email or web address].

## 2023-05-14 NOTE — Progress Notes (Signed)
Subjective: 3 Days Post-Op Procedure(s) (LRB): INTRAMEDULLARY (IM) NAIL FEMORAL (Left)  Patient reports pain as appropriately controlled. Denies any new numbness/tingling.   Objective:   VITALS:  Temp:  [98.2 F (36.8 C)-98.3 F (36.8 C)] 98.3 F (36.8 C) (12/25 0631) Pulse Rate:  [83-85] 85 (12/25 0631) Resp:  [14] 14 (12/24 2154) BP: (136-141)/(57-85) 136/85 (12/25 0631) SpO2:  [93 %-95 %] 95 % (12/25 0631)  Neurovascular intact Sensation intact distally Intact pulses distally Dorsiflexion/Plantar flexion intact Incision: dressing C/D/I Compartment soft   LABS Recent Labs    05/12/23 0705 05/12/23 0914 05/14/23 0331  HGB 7.8* 8.4* 7.3*  WBC 6.5 6.7 6.4  PLT 109* 126* 131*   No results for input(s): "NA", "K", "CL", "CO2", "BUN", "CREATININE", "GLUCOSE" in the last 72 hours.  No results for input(s): "LABPT", "INR" in the last 72 hours.    Assessment/Plan: 3 Days Post-Op Procedure(s) (LRB): INTRAMEDULLARY (IM) NAIL FEMORAL (Left)  Advance diet Up with therapy Discharge to SNF Dispo per primary team  -gradual progression of WBAT with walker and 1-2 person assist -PT/OT consults, will benefit from SNF/rehab postop -DVT ppx per primary team -follow up as outpatient in 7-10 days for wound check (contact [phone]) -sutures out in 2-3 weeks in outpatient office  Netta Cedars 05/14/2023, 9:10 AM

## 2023-05-15 DIAGNOSIS — S72142A Displaced intertrochanteric fracture of left femur, initial encounter for closed fracture: Secondary | ICD-10-CM | POA: Diagnosis not present

## 2023-05-15 NOTE — TOC Progression Note (Signed)
Transition of Care [facility]) - Progression Note   Patient Details  Name: Lauren Davidson MRN: [id number] Date of Birth: December 30, 1921  Transition of Care [facility]) CM/SW Contact  Ewing Schlein, LCSW Phone Number: 05/15/2023, 3:55 PM  Clinical Narrative: CSW provided patient's family with the following bed offers:  [facility] [address] [place], [place] [address] [phone] Overall rating ? Much below average  [facility] [address] [place], [place] [address] [phone] Overall rating ????? Much above average  [facility] and [facility] [address] [place], [place] [address] [phone] Overall rating ????? Much above average  CSW received call from patient's son-in-law, [name], and [facility] was chosen. CSW followed up with [facility] in admissions at [place] and confirmed bed. Per Lowella Bandy, one may be available tomorrow but she will know for sure in the morning. CSW completed insurance authorization in the Availity portal. Certification # is: [id number]. Patient is approved for 05/16/23 thru 05/22/23. CSW updated Lowella Bandy and Jake Shark regarding insurance approval. TOC awaiting SNF bed.  Expected Discharge Plan: Skilled Nursing Facility Barriers to Discharge: Continued Medical Work up, SNF Pending bed offer, English as a second language teacher  Expected Discharge Plan and Services In-house Referral: Clinical Social Work Post Acute Care Choice: Skilled Nursing Facility Living arrangements for the past 2 months: Assisted Living Facility             DME Arranged: N/A DME Agency: NA  Social Determinants of Health (SDOH) Interventions SDOH Screenings   Food Insecurity: No Food Insecurity (05/10/2023)  Housing: Low Risk  (05/10/2023)  Transportation Needs: No Transportation Needs (05/10/2023)  Utilities: Not At Risk (05/10/2023)  Tobacco Use: Low Risk  (05/10/2023)   Readmission Risk Interventions     No data to display

## 2023-05-15 NOTE — Progress Notes (Signed)
PROGRESS NOTE    Lauren Davidson  NWG:[id number] DOB: 1921-09-15 DOA: 05/10/2023 PCP: Merlene Laughter, MD (Inactive)     Brief Narrative:  Lauren Davidson is a 89 year old female with Sjogren's syndrome, hypertension, chronic back pain, HFpEF who presented from assisted living after an unwitnessed fall.  She stated that she slipped and fell onto her left side while walking with her walker.  She complained of left hip pain and was brought to the ED. In the ED, she was found to have a left intertrochanteric fracture. She underwent ORIF and intramedullary nail by Dr. Odis Hollingshead on 12/22.   New events last 24 hours / Subjective: Patient complaining of dry mouth due to her chronic Sjogren's syndrome.  Assessment & Plan:   Principal Problem:   Closed displaced intertrochanteric fracture of left femur, initial encounter (HCC) Active Problems:   Sjogren's syndrome (HCC)   Left hip fracture status post ORIF -SNF placement pending -Follow-up with orthopedic surgery in 7 to 10 days for wound check, sutures out in 2 to 3 weeks  Acute blood loss anemia -Continue to trend hemoglobin -Transfuse for hemoglobin <7  Hypertension -Amlodipine  Sjogren's syndrome   DVT prophylaxis:  enoxaparin (LOVENOX) injection 40 mg Start: 05/11/23 2200  Code Status: DNR Family Communication: None at bedside Disposition Plan: SNF Status is: Inpatient Remains inpatient appropriate because: Placement pending    Antimicrobials:  Anti-infectives (From admission, onward)    Start     Dose/Rate Route Frequency Ordered Stop   05/11/23 1400  ceFAZolin (ANCEF) IVPB 1 g/50 mL premix        1 g 100 mL/hr over 30 Minutes Intravenous Every 8 hours 05/11/23 0957 05/11/23 2200   05/11/23 0730  ceFAZolin (ANCEF) IVPB 2g/100 mL premix        2 g 200 mL/hr over 30 Minutes Intravenous On call to O.R. 05/11/23 5784 05/11/23 0824   05/11/23 6962  ceFAZolin (ANCEF) 2-4 GM/100ML-% IVPB       Note to Pharmacy: Myrlene Broker M: cabinet override      05/11/23 0638 05/11/23 0803        Objective: Vitals:   05/14/23 0631 05/14/23 1334 05/14/23 2132 05/15/23 0524  BP: 136/85 (!) 121/47 131/60 126/68  Pulse: 85 71 71 75  Resp:  16 15 17   Temp: 98.3 F (36.8 C) 97.9 F (36.6 C) 98.4 F (36.9 C) 98.2 F (36.8 C)  TempSrc: Axillary  Oral Oral  SpO2: 95% 95% 97% 98%  Weight:      Height:        Intake/Output Summary (Last 24 hours) at 05/15/2023 1213 Last data filed at 05/15/2023 1000 Gross per 24 hour  Intake 470 ml  Output 1025 ml  Net -555 ml   Filed Weights   05/10/23 2036  Weight: 53.6 kg    Examination:  General exam: Appears calm and comfortable, very HOH  Respiratory system: Clear to auscultation. Respiratory effort normal. No respiratory distress. No conversational dyspnea.  Cardiovascular system: S1 & S2 heard, RRR. No murmurs. No pedal edema. Gastrointestinal system: Abdomen is nondistended, soft and nontender. Normal bowel sounds heard. Central nervous system: Alert and oriented. No focal neurological deficits. Speech clear.  Extremities: Symmetric in appearance  Skin: No rashes, lesions or ulcers on exposed skin  Psychiatry: Judgement and insight appear normal. Mood & affect appropriate.   Data Reviewed: I have personally reviewed following labs and imaging studies  CBC: Recent Labs  Lab 05/10/23 1415 05/12/23 0705 05/12/23 0914 05/14/23 0331  WBC 8.5 6.5 6.7 6.4  NEUTROABS 6.4  --   --   --   HGB 12.6 7.8* 8.4* 7.3*  HCT 37.7 23.1* 25.1* 22.5*  MCV 99.5 101.3* 101.2* 104.2*  PLT 147* 109* 126* 131*   Basic Metabolic Panel: Recent Labs  Lab 05/10/23 1415 05/10/23 1753  NA 132*  --   K 4.1  --   CL 98  --   CO2 23  --   GLUCOSE 104*  --   BUN 19  --   CREATININE 0.62  --   CALCIUM 8.8*  --   MG  --  2.6*  PHOS  --  3.6   GFR: Estimated Creatinine Clearance: 32.4 mL/min (by C-G formula based on SCr of 0.62 mg/dL). Liver Function Tests: Recent Labs   Lab 05/10/23 1415  AST 32  ALT 17  ALKPHOS 98  BILITOT 0.8  PROT 6.7  ALBUMIN 3.6   No results for input(s): "LIPASE", "AMYLASE" in the last 168 hours. No results for input(s): "AMMONIA" in the last 168 hours. Coagulation Profile: Recent Labs  Lab 05/10/23 1753  INR 1.0   Cardiac Enzymes: No results for input(s): "CKTOTAL", "CKMB", "CKMBINDEX", "TROPONINI" in the last 168 hours. BNP (last 3 results) No results for input(s): "PROBNP" in the last 8760 hours. HbA1C: No results for input(s): "HGBA1C" in the last 72 hours. CBG: No results for input(s): "GLUCAP" in the last 168 hours. Lipid Profile: No results for input(s): "CHOL", "HDL", "LDLCALC", "TRIG", "CHOLHDL", "LDLDIRECT" in the last 72 hours. Thyroid Function Tests: No results for input(s): "TSH", "T4TOTAL", "FREET4", "T3FREE", "THYROIDAB" in the last 72 hours. Anemia Panel: Recent Labs    05/12/23 1352 05/13/23 0330  VITAMINB12  --  361  FOLATE 14.0  --   FERRITIN 55  --   TIBC 308  --   IRON 24*  --   RETICCTPCT 2.5  --    Sepsis Labs: No results for input(s): "PROCALCITON", "LATICACIDVEN" in the last 168 hours.  Recent Results (from the past 240 hours)  Surgical pcr screen     Status: Abnormal   Collection Time: 05/10/23  8:24 PM   Specimen: Nasal Mucosa; Nasal Swab  Result Value Ref Range Status   MRSA, PCR NEGATIVE NEGATIVE Final   Staphylococcus aureus POSITIVE (A) NEGATIVE Final    Comment: (NOTE) The Xpert SA Assay (FDA approved for NASAL specimens in patients 29 years of age and older), is one component of a comprehensive surveillance program. It is not intended to diagnose infection nor to guide or monitor treatment. Performed at [facility], [address], [place], [place] [address]       Radiology Studies: No results found.    Scheduled Meds:  sodium chloride   Intravenous Once   amLODipine  5 mg Oral Daily   calcium-vitamin D  1 tablet Oral Q breakfast    docusate sodium  100 mg Oral BID   enoxaparin (LOVENOX) injection  40 mg Subcutaneous Q24H   ferrous gluconate  324 mg Oral Q breakfast   multivitamin  1 tablet Oral Daily   mupirocin ointment  1 Application Nasal BID   pantoprazole  40 mg Oral q morning   polyvinyl alcohol  1 drop Both Eyes TID   Continuous Infusions:   LOS: 5 days   Time spent: 25 minutes   Noralee Stain, DO [facility] 05/15/2023, 12:13 PM   Available via Epic secure chat 7am-7pm After these hours, please refer to coverage provider  listed on [email or web address]

## 2023-05-15 NOTE — Progress Notes (Signed)
Physical Therapy Treatment Patient Details Name: Lauren Davidson MRN: [id number] DOB: August 24, 1921 Today's Date: 05/15/2023   History of Present Illness patient is a 89 year old female who presented from assisted living after an unwitnessed fall. Dx of L IT femur fx, s/p ORIF 05/11/23. Pt with h/o  Sjogren's syndrome, hypertension, chronic back pain, HFpEF, T12 & L1 kyphoplasty May 2024.    PT Comments  Pt continues cooperative but ltd by pain and anxiety with OOB activity.  Pt continues to require significant assist for all basic mobility tasks and this date able to stand with RW but requiring use of STEDY for safe transition from bedside to recliner.    If plan is discharge home, recommend the following: A lot of help with walking and/or transfers;A lot of help with bathing/dressing/bathroom;Assistance with cooking/housework;Assist for transportation;Help with stairs or ramp for entrance   Can travel by private vehicle     No  Equipment Recommendations  Other (comment)    Recommendations for Other Services       Precautions / Restrictions Precautions Precautions: Fall Precaution Comments: fell just PTA, denies h/o other falls in past 6 months Restrictions Weight Bearing Restrictions Per Provider Order: No Other Position/Activity Restrictions: WBAT     Mobility  Bed Mobility Overal bed mobility: Needs Assistance Bed Mobility: Supine to Sit     Supine to sit: Mod assist, +2 for physical assistance, +2 for safety/equipment     General bed mobility comments: Increased time with cues for sequence and assist to control trunk, manage L LE and complete rotation to EOB sitting using bed pad    Transfers Overall transfer level: Needs assistance Equipment used: Bilateral platform walker, Rolling walker (2 wheels) Transfers: Sit to/from Stand Sit to Stand: Mod assist, +2 physical assistance, +2 safety/equipment, From elevated surface           General transfer comment: cues  for LE management and use of UEs to self assist.  Physical assist to bring wt up and fwd and to balance in standing with RW correction for posterior drift    Ambulation/Gait Ambulation/Gait assistance: Mod assist, +2 physical assistance, +2 safety/equipment Gait Distance (Feet): 1 Feet Assistive device: Rolling walker (2 wheels) Gait Pattern/deviations: Step-to pattern, Narrow base of support, Trunk flexed, Decreased step length - right, Decreased step length - left Gait velocity: decreased     General Gait Details: cues for posture, sequence and position from RW.  Pt ltd to step fwd and back and unable to tolerate beyond 2* hip pain and returned to sitting.  Pt assisted to stand into STEDY and transitioned to sitting up in recliner   Stairs             Wheelchair Mobility     Tilt Bed    Modified Rankin (Stroke Patients Only)       Balance Overall balance assessment: Needs assistance Sitting-balance support: Bilateral upper extremity supported, Feet unsupported Sitting balance-Leahy Scale: Fair     Standing balance support: Bilateral upper extremity supported, Reliant on assistive device for balance, During functional activity Standing balance-Leahy Scale: Poor                              Cognition Arousal: Alert Behavior During Therapy: WFL for tasks assessed/performed Overall Cognitive Status: Within Functional Limits for tasks assessed  General Comments: AxO x 3 pleasant Lady who resides at [facility] when she fell using her Rollator with an "issue" with her bifocals.        Exercises      General Comments        Pertinent Vitals/Pain Pain Assessment Pain Assessment: Faces Faces Pain Scale: Hurts even more Pain Location: L hip with activity Pain Descriptors / Indicators: Grimacing, Operative site guarding Pain Intervention(s): Limited activity within patient's tolerance, Monitored during  session, Ice applied, Premedicated before session    Home Living                          Prior Function            PT Goals (current goals can now be found in the care plan section) Acute Rehab PT Goals Patient Stated Goal: to walk PT Goal Formulation: With patient Time For Goal Achievement: 05/26/23 Potential to Achieve Goals: Good Progress towards PT goals: Progressing toward goals    Frequency    Min 1X/week      PT Plan      Co-evaluation              AM-PAC PT "6 Clicks" Mobility   Outcome Measure  Help needed turning from your back to your side while in a flat bed without using bedrails?: A Lot Help needed moving from lying on your back to sitting on the side of a flat bed without using bedrails?: A Lot Help needed moving to and from a bed to a chair (including a wheelchair)?: A Lot Help needed standing up from a chair using your arms (e.g., wheelchair or bedside chair)?: Total Help needed to walk in hospital room?: Total Help needed climbing 3-5 steps with a railing? : Total 6 Click Score: 9    End of Session Equipment Utilized During Treatment: Gait belt Activity Tolerance: Patient limited by fatigue;Patient limited by pain Patient left: in chair;with call bell/phone within reach;with chair alarm set;with nursing/sitter in room;with family/visitor present Nurse Communication: Mobility status PT Visit Diagnosis: Difficulty in walking, not elsewhere classified (R26.2);Pain Pain - Right/Left: Left Pain - part of body: Hip     Time: 8756-4332 PT Time Calculation (min) (ACUTE ONLY): 33 min  Charges:    $Therapeutic Activity: 23-37 mins PT General Charges $$ ACUTE PT VISIT: 1 Visit                     Mauro Kaufmann PT Acute Rehabilitation Services Pager [phone] Office [phone]    [facility] 05/15/2023, 4:39 PM

## 2023-05-15 NOTE — Plan of Care (Signed)
  Problem: Coping: Goal: Level of anxiety will decrease Outcome: Progressing   Problem: Pain Management: Goal: General experience of comfort will improve Outcome: Progressing   Problem: Safety: Goal: Ability to remain free from injury will improve Outcome: Progressing   Problem: Skin Integrity: Goal: Risk for impaired skin integrity will decrease Outcome: Progressing

## 2023-05-16 DIAGNOSIS — I509 Heart failure, unspecified: Secondary | ICD-10-CM | POA: Diagnosis not present

## 2023-05-16 DIAGNOSIS — Z79899 Other long term (current) drug therapy: Secondary | ICD-10-CM | POA: Diagnosis not present

## 2023-05-16 DIAGNOSIS — N189 Chronic kidney disease, unspecified: Secondary | ICD-10-CM | POA: Diagnosis not present

## 2023-05-16 DIAGNOSIS — R262 Difficulty in walking, not elsewhere classified: Secondary | ICD-10-CM | POA: Diagnosis not present

## 2023-05-16 DIAGNOSIS — S0181XA Laceration without foreign body of other part of head, initial encounter: Secondary | ICD-10-CM | POA: Diagnosis not present

## 2023-05-16 DIAGNOSIS — Z7401 Bed confinement status: Secondary | ICD-10-CM | POA: Diagnosis not present

## 2023-05-16 DIAGNOSIS — I517 Cardiomegaly: Secondary | ICD-10-CM | POA: Diagnosis not present

## 2023-05-16 DIAGNOSIS — D62 Acute posthemorrhagic anemia: Secondary | ICD-10-CM | POA: Diagnosis not present

## 2023-05-16 DIAGNOSIS — I129 Hypertensive chronic kidney disease with stage 1 through stage 4 chronic kidney disease, or unspecified chronic kidney disease: Secondary | ICD-10-CM | POA: Diagnosis not present

## 2023-05-16 DIAGNOSIS — S0240EA Zygomatic fracture, right side, initial encounter for closed fracture: Secondary | ICD-10-CM | POA: Diagnosis not present

## 2023-05-16 DIAGNOSIS — M6281 Muscle weakness (generalized): Secondary | ICD-10-CM | POA: Diagnosis not present

## 2023-05-16 DIAGNOSIS — R531 Weakness: Secondary | ICD-10-CM | POA: Diagnosis not present

## 2023-05-16 DIAGNOSIS — Z9181 History of falling: Secondary | ICD-10-CM | POA: Diagnosis not present

## 2023-05-16 DIAGNOSIS — R519 Headache, unspecified: Secondary | ICD-10-CM | POA: Diagnosis not present

## 2023-05-16 DIAGNOSIS — D649 Anemia, unspecified: Secondary | ICD-10-CM | POA: Diagnosis not present

## 2023-05-16 DIAGNOSIS — R296 Repeated falls: Secondary | ICD-10-CM | POA: Diagnosis not present

## 2023-05-16 DIAGNOSIS — R1312 Dysphagia, oropharyngeal phase: Secondary | ICD-10-CM | POA: Diagnosis not present

## 2023-05-16 DIAGNOSIS — N182 Chronic kidney disease, stage 2 (mild): Secondary | ICD-10-CM | POA: Diagnosis not present

## 2023-05-16 DIAGNOSIS — R58 Hemorrhage, not elsewhere classified: Secondary | ICD-10-CM | POA: Diagnosis not present

## 2023-05-16 DIAGNOSIS — W19XXXA Unspecified fall, initial encounter: Secondary | ICD-10-CM | POA: Diagnosis not present

## 2023-05-16 DIAGNOSIS — Z743 Need for continuous supervision: Secondary | ICD-10-CM | POA: Diagnosis not present

## 2023-05-16 DIAGNOSIS — R2681 Unsteadiness on feet: Secondary | ICD-10-CM | POA: Diagnosis not present

## 2023-05-16 DIAGNOSIS — I700 Atherosclerosis of aorta: Secondary | ICD-10-CM | POA: Diagnosis not present

## 2023-05-16 DIAGNOSIS — M19041 Primary osteoarthritis, right hand: Secondary | ICD-10-CM | POA: Diagnosis not present

## 2023-05-16 DIAGNOSIS — R600 Localized edema: Secondary | ICD-10-CM | POA: Diagnosis not present

## 2023-05-16 DIAGNOSIS — I130 Hypertensive heart and chronic kidney disease with heart failure and stage 1 through stage 4 chronic kidney disease, or unspecified chronic kidney disease: Secondary | ICD-10-CM | POA: Diagnosis not present

## 2023-05-16 DIAGNOSIS — S0101XA Laceration without foreign body of scalp, initial encounter: Secondary | ICD-10-CM | POA: Diagnosis not present

## 2023-05-16 DIAGNOSIS — M549 Dorsalgia, unspecified: Secondary | ICD-10-CM | POA: Diagnosis not present

## 2023-05-16 DIAGNOSIS — I10 Essential (primary) hypertension: Secondary | ICD-10-CM | POA: Diagnosis not present

## 2023-05-16 DIAGNOSIS — H9190 Unspecified hearing loss, unspecified ear: Secondary | ICD-10-CM | POA: Diagnosis not present

## 2023-05-16 DIAGNOSIS — S0990XA Unspecified injury of head, initial encounter: Secondary | ICD-10-CM | POA: Diagnosis not present

## 2023-05-16 DIAGNOSIS — K219 Gastro-esophageal reflux disease without esophagitis: Secondary | ICD-10-CM | POA: Diagnosis not present

## 2023-05-16 DIAGNOSIS — S72142D Displaced intertrochanteric fracture of left femur, subsequent encounter for closed fracture with routine healing: Secondary | ICD-10-CM | POA: Diagnosis not present

## 2023-05-16 DIAGNOSIS — R0602 Shortness of breath: Secondary | ICD-10-CM | POA: Diagnosis not present

## 2023-05-16 DIAGNOSIS — S61210A Laceration without foreign body of right index finger without damage to nail, initial encounter: Secondary | ICD-10-CM | POA: Diagnosis not present

## 2023-05-16 DIAGNOSIS — S72142A Displaced intertrochanteric fracture of left femur, initial encounter for closed fracture: Secondary | ICD-10-CM | POA: Diagnosis not present

## 2023-05-16 DIAGNOSIS — G9389 Other specified disorders of brain: Secondary | ICD-10-CM | POA: Diagnosis not present

## 2023-05-16 DIAGNOSIS — S79929A Unspecified injury of unspecified thigh, initial encounter: Secondary | ICD-10-CM | POA: Diagnosis not present

## 2023-05-16 DIAGNOSIS — W0110XA Fall on same level from slipping, tripping and stumbling with subsequent striking against unspecified object, initial encounter: Secondary | ICD-10-CM | POA: Diagnosis not present

## 2023-05-16 DIAGNOSIS — G8929 Other chronic pain: Secondary | ICD-10-CM | POA: Diagnosis not present

## 2023-05-16 DIAGNOSIS — E785 Hyperlipidemia, unspecified: Secondary | ICD-10-CM | POA: Diagnosis not present

## 2023-05-16 DIAGNOSIS — S0993XA Unspecified injury of face, initial encounter: Secondary | ICD-10-CM | POA: Diagnosis present

## 2023-05-16 DIAGNOSIS — M3500 Sicca syndrome, unspecified: Secondary | ICD-10-CM | POA: Diagnosis not present

## 2023-05-16 DIAGNOSIS — R2689 Other abnormalities of gait and mobility: Secondary | ICD-10-CM | POA: Diagnosis not present

## 2023-05-16 LAB — CBC
HCT: 21.6 % — ABNORMAL LOW (ref 36.0–46.0)
Hemoglobin: 7.1 g/dL — ABNORMAL LOW (ref 12.0–15.0)
MCH: 33.6 pg (ref 26.0–34.0)
MCHC: 32.9 g/dL (ref 30.0–36.0)
MCV: 102.4 fL — ABNORMAL HIGH (ref 80.0–100.0)
Platelets: 153 10*3/uL (ref 150–400)
RBC: 2.11 MIL/uL — ABNORMAL LOW (ref 3.87–5.11)
RDW: 13.9 % (ref 11.5–15.5)
WBC: 6.3 10*3/uL (ref 4.0–10.5)
nRBC: 0.3 % — ABNORMAL HIGH (ref 0.0–0.2)

## 2023-05-16 MED ORDER — FERROUS GLUCONATE 324 (38 FE) MG PO TABS: 324.0000 mg | ORAL_TABLET | Freq: Every day | ORAL | 0 refills | Status: DC

## 2023-05-16 MED ORDER — HYDROCODONE-ACETAMINOPHEN 5-325 MG PO TABS: 1.0000 | ORAL_TABLET | Freq: Four times a day (QID) | ORAL | 0 refills | Status: AC | PRN

## 2023-05-16 NOTE — TOC Transition Note (Signed)
Transition of Care [facility]) - Discharge Note  Patient Details  Name: Lauren Davidson MRN: [id number] Date of Birth: [birth year]  [facility] CM/SW Contact:  Ewing Schlein, LCSW Phone Number: 05/16/2023, 11:52 AM  Clinical Narrative: CSW confirmed bed availability with [name] at [facility]. Patient will go to room 506P and the number for report is [phone]. Discharge summary, discharge orders, and SNF transfer report faxed to facility in hub. Medical necessity form done; PTAR scheduled. Discharge packet completed. Patient and son, Lauren Davidson, updated regarding discharge and transportation. RN updated. TOC signing off.  Final next level of care: Skilled Nursing Facility Barriers to Discharge: Barriers Resolved  Patient Goals and CMS Choice Patient states their goals for this hospitalization and ongoing recovery are:: Go to rehab CMS Medicare.gov Compare Post Acute Care list provided to:: Patient Choice offered to / list presented to : Patient  Discharge Placement Existing PASRR number confirmed : 05/12/23          Patient chooses bed at: [facility] Patient to be transferred to facility by: PTAR Name of family member notified: Lauren Davidson (son/POA) Patient and family notified of of transfer: 05/16/23  Discharge Plan and Services Additional resources added to the After Visit Summary for   In-house Referral: Clinical Social Work Post Acute Care Choice: Skilled Nursing Facility          DME Arranged: N/A DME Agency: NA  Social Drivers of Health (SDOH) Interventions SDOH Screenings   Food Insecurity: No Food Insecurity (05/10/2023)  Housing: Low Risk  (05/10/2023)  Transportation Needs: No Transportation Needs (05/10/2023)  Utilities: Not At Risk (05/10/2023)  Tobacco Use: Low Risk  (05/10/2023)   Readmission Risk Interventions     No data to display

## 2023-05-16 NOTE — Discharge Summary (Signed)
Physician Discharge Summary  Lauren Davidson DGU:[id number] DOB: [birth year] DOA: 05/10/2023  PCP: Merlene Laughter, MD (Inactive)  Admit date: 05/10/2023 Discharge date: 05/16/2023  Disposition:  SNF  Recommendations for Outpatient Follow-up:  Follow up with PCP Follow up with Orthopedic surgery in 7-10 days for wound check, sutures out in 2-3 weeks in outpatient office   Discharge Condition: Stable CODE STATUS: DNR  Diet recommendation: Regular   Brief/Interim Summary: Lauren Davidson is a 89 year old female with Sjogren's syndrome, hypertension, chronic back pain, HFpEF who presented from assisted living after an unwitnessed fall.  She stated that she slipped and fell onto her left side while walking with her walker.  She complained of left hip pain and was brought to the ED. In the ED, she was found to have a left intertrochanteric fracture. She underwent ORIF and intramedullary nail by Dr. Odis Hollingshead on 12/22.   Discharge Diagnoses:   Principal Problem:   Closed displaced intertrochanteric fracture of left femur, initial encounter (HCC) Active Problems:   HTN (hypertension)   Sjogren's syndrome (HCC)   HOH (hard of hearing)   Acute blood loss anemia   Discharge Instructions  Discharge Instructions     Diet general   Complete by: As directed    Increase activity slowly   Complete by: As directed    No wound care   Complete by: As directed       Allergies as of 05/16/2023       Reactions   Statins Other (See Comments)   Muscle weakness and pain   Buprenorphine Hcl Other (See Comments)   Patient questioned this entry   Buspirone Other (See Comments)   Weakness   Lexapro [escitalopram] Other (See Comments)   "Muscle problems"   Lovastatin Other (See Comments)   Muscle pain   Meperidine Other (See Comments)   Demerol- questionable allergy- reaction not recalled   Morphine And Codeine Rash        Medication List     TAKE these medications    acetaminophen  500 MG tablet Commonly known as: TYLENOL Take 500 mg by mouth every 6 (six) hours as needed (for pain).   amLODipine 5 MG tablet Commonly known as: NORVASC Take 1 tablet (5 mg total) by mouth daily.   BIOTIN PO Take 30 mLs by mouth daily. Medication: Mouthwash   CALCIUM+D3 PO Take 1 tablet by mouth daily with breakfast.   docusate sodium 100 MG capsule Commonly known as: COLACE Take 1 capsule (100 mg total) by mouth 2 (two) times daily.   ferrous gluconate 324 MG tablet Commonly known as: FERGON Take 1 tablet (324 mg total) by mouth daily with breakfast. Start taking on: May 17, 2023   HYDROcodone-acetaminophen 5-325 MG tablet Commonly known as: NORCO/VICODIN Take 1 tablet by mouth every 6 (six) hours as needed for up to 7 days for moderate pain (pain score 4-6) or severe pain (pain score 7-10).   ibuprofen 200 MG tablet Commonly known as: ADVIL Take 200 mg by mouth every 6 (six) hours as needed for moderate pain (pain score 4-6) or mild pain (pain score 1-3).   pantoprazole 40 MG tablet Commonly known as: PROTONIX Take 40 mg by mouth every morning.   PreserVision AREDS 2 Caps Take 1 capsule by mouth in the morning and at bedtime.   Systane Ultra PF 0.4-0.3 % Soln Generic drug: Polyethyl Glyc-Propyl Glyc PF Place 1 drop into both eyes in the morning, at noon, in the evening, and at bedtime.  Contact information for follow-up providers     Merlene Laughter, MD Follow up.   Specialty: Internal Medicine Contact information: [address] [place] [address] [phone]         Netta Cedars, MD Follow up in 1 week(s).   Specialty: Orthopedic Surgery Why: For wound check Contact information: [address] [place] [address] [phone]              Contact information for after-discharge care     Destination     [facility] Preferred SNF .   Service: Skilled Nursing Contact  information: [address] [place] [address] [phone]                    Allergies  Allergen Reactions   Statins Other (See Comments)    Muscle weakness and pain   Buprenorphine Hcl Other (See Comments)    Patient questioned this entry   Buspirone Other (See Comments)    Weakness    Lexapro [Escitalopram] Other (See Comments)    "Muscle problems"   Lovastatin Other (See Comments)    Muscle pain   Meperidine Other (See Comments)    Demerol- questionable allergy- reaction not recalled   Morphine And Codeine Rash    Consultations: Orthopedic surgery    Procedures/Studies: DG FEMUR MIN 2 VIEWS LEFT Result Date: 05/11/2023 CLINICAL DATA:  Fluoro guidance provided EXAM: LEFT FEMUR 2 VIEWS FINDINGS: Dose: Cumulative Air Kerma 201.3mG y Fluoro time 145s IMPRESSION: C-arm fluoro guidance provided. Electronically Signed   By: Layla Maw M.D.   On: 05/11/2023 11:33   DG C-Arm 1-60 Min-No Report Result Date: 05/11/2023 Fluoroscopy was utilized by the requesting physician.  No radiographic interpretation.   DG C-Arm 1-60 Min-No Report Result Date: 05/11/2023 Fluoroscopy was utilized by the requesting physician.  No radiographic interpretation.   DG FEMUR MIN 2 VIEWS LEFT Result Date: 05/10/2023 CLINICAL DATA:  Left femur fracture. EXAM: LEFT FEMUR 2 VIEWS COMPARISON:  Left hip radiograph dated 05/10/2023. FINDINGS: Evaluation is limited due to osteopenia and body habitus. Mildly displaced intertrochanteric fracture of the left femur. No dislocation. The bones are osteopenic. The soft tissues are unremarkable. IMPRESSION: Mildly displaced intertrochanteric fracture of the left femur. Electronically Signed   By: Elgie Collard M.D.   On: 05/10/2023 15:27   CT Head Wo Contrast Result Date: 05/10/2023 CLINICAL DATA:  Head trauma.  Unwitnessed fall. EXAM: CT HEAD WITHOUT CONTRAST TECHNIQUE: Contiguous axial images were obtained from the base of the  skull through the vertex without intravenous contrast. RADIATION DOSE REDUCTION: This exam was performed according to the departmental dose-optimization program which includes automated exposure control, adjustment of the mA and/or kV according to patient size and/or use of iterative reconstruction technique. COMPARISON:  07/30/2022 FINDINGS: Brain: There is no evidence for acute hemorrhage, hydrocephalus, mass lesion, or abnormal extra-axial fluid collection. No definite CT evidence for acute infarction. Diffuse loss of parenchymal volume is consistent with atrophy. Patchy low attenuation in the deep hemispheric and periventricular white matter is nonspecific, but likely reflects chronic microvascular ischemic demyelination. Vascular: No hyperdense vessel or unexpected calcification. Skull: No evidence for fracture. No worrisome lytic or sclerotic lesion. Sinuses/Orbits: The visualized paranasal sinuses and mastoid air cells are clear. Other: None. IMPRESSION: 1. No acute intracranial abnormality. 2. Atrophy with chronic small vessel ischemic disease. Electronically Signed   By: Kennith Center M.D.   On: 05/10/2023 14:10   DG HIP UNILAT WITH PELVIS  2-3 VIEWS LEFT Result Date: 05/10/2023 CLINICAL DATA:  Fall with hip pain. EXAM: DG HIP (WITH OR WITHOUT PELVIS) 2-3V LEFT COMPARISON:  Hip radiographs dated 07/18/2022. FINDINGS: There is an acute fracture of the intertrochanteric region of the left femur. No hip joint dislocation. Degenerative changes are seen in the spine. IMPRESSION: Acute intertrochanteric fracture of the left femur. Electronically Signed   By: Romona Curls M.D.   On: 05/10/2023 14:03       Discharge Exam: Vitals:   05/15/23 2127 05/16/23 0629  BP: (!) 111/55 (!) 124/54  Pulse: 66 70  Resp: 17 17  Temp: 99.5 F (37.5 C) 97.8 F (36.6 C)  SpO2: 98% 94%    General: Pt is alert, awake, not in acute distress, HOH  Cardiovascular: RRR, S1/S2 +, no edema Respiratory: CTA  bilaterally, no wheezing, no rhonchi, no respiratory distress, no conversational dyspnea  Abdominal: Soft, NT, ND, bowel sounds + Extremities: no edema, no cyanosis Psych: Normal mood and affect, stable judgement and insight     The results of significant diagnostics from this hospitalization (including imaging, microbiology, ancillary and laboratory) are listed below for reference.     Microbiology: Recent Results (from the past 240 hours)  Surgical pcr screen     Status: Abnormal   Collection Time: 05/10/23  8:24 PM   Specimen: Nasal Mucosa; Nasal Swab  Result Value Ref Range Status   MRSA, PCR NEGATIVE NEGATIVE Final   Staphylococcus aureus POSITIVE (A) NEGATIVE Final    Comment: (NOTE) The Xpert SA Assay (FDA approved for NASAL specimens in patients 50 years of age and older), is one component of a comprehensive surveillance program. It is not intended to diagnose infection nor to guide or monitor treatment. Performed at [facility], [address], [place], [place] [address]      Labs: BNP (last 3 results) Recent Labs    06/12/22 1550  BNP 343.0*   Basic Metabolic Panel: Recent Labs  Lab 05/10/23 1415 05/10/23 1753  NA 132*  --   K 4.1  --   CL 98  --   CO2 23  --   GLUCOSE 104*  --   BUN 19  --   CREATININE 0.62  --   CALCIUM 8.8*  --   MG  --  2.6*  PHOS  --  3.6   Liver Function Tests: Recent Labs  Lab 05/10/23 1415  AST 32  ALT 17  ALKPHOS 98  BILITOT 0.8  PROT 6.7  ALBUMIN 3.6   No results for input(s): "LIPASE", "AMYLASE" in the last 168 hours. No results for input(s): "AMMONIA" in the last 168 hours. CBC: Recent Labs  Lab 05/10/23 1415 05/12/23 0705 05/12/23 0914 05/14/23 0331 05/16/23 0324  WBC 8.5 6.5 6.7 6.4 6.3  NEUTROABS 6.4  --   --   --   --   HGB 12.6 7.8* 8.4* 7.3* 7.1*  HCT 37.7 23.1* 25.1* 22.5* 21.6*  MCV 99.5 101.3* 101.2* 104.2* 102.4*  PLT 147* 109* 126* 131* 153   Cardiac Enzymes: No  results for input(s): "CKTOTAL", "CKMB", "CKMBINDEX", "TROPONINI" in the last 168 hours. BNP: Invalid input(s): "POCBNP" CBG: No results for input(s): "GLUCAP" in the last 168 hours. D-Dimer No results for input(s): "DDIMER" in the last 72 hours. Hgb A1c No results for input(s): "HGBA1C" in the last 72 hours. Lipid Profile No results for input(s): "CHOL", "HDL", "LDLCALC", "TRIG", "CHOLHDL", "LDLDIRECT" in the last 72 hours. Thyroid function studies No results for  input(s): "TSH", "T4TOTAL", "T3FREE", "THYROIDAB" in the last 72 hours.  Invalid input(s): "FREET3" Anemia work up No results for input(s): "VITAMINB12", "FOLATE", "FERRITIN", "TIBC", "IRON", "RETICCTPCT" in the last 72 hours. Urinalysis    Component Value Date/Time   COLORURINE YELLOW 06/12/2022 2324   APPEARANCEUR CLEAR 06/12/2022 2324   LABSPEC 1.010 06/12/2022 2324   PHURINE 7.0 06/12/2022 2324   GLUCOSEU NEGATIVE 06/12/2022 2324   HGBUR NEGATIVE 06/12/2022 2324   BILIRUBINUR NEGATIVE 06/12/2022 2324   KETONESUR NEGATIVE 06/12/2022 2324   PROTEINUR NEGATIVE 06/12/2022 2324   NITRITE NEGATIVE 06/12/2022 2324   LEUKOCYTESUR NEGATIVE 06/12/2022 2324   Sepsis Labs Recent Labs  Lab 05/12/23 0705 05/12/23 0914 05/14/23 0331 05/16/23 0324  WBC 6.5 6.7 6.4 6.3   Microbiology Recent Results (from the past 240 hours)  Surgical pcr screen     Status: Abnormal   Collection Time: 05/10/23  8:24 PM   Specimen: Nasal Mucosa; Nasal Swab  Result Value Ref Range Status   MRSA, PCR NEGATIVE NEGATIVE Final   Staphylococcus aureus POSITIVE (A) NEGATIVE Final    Comment: (NOTE) The Xpert SA Assay (FDA approved for NASAL specimens in patients 75 years of age and older), is one component of a comprehensive surveillance program. It is not intended to diagnose infection nor to guide or monitor treatment. Performed at [facility], [address] [place], [place] [address]      Patient was seen and  examined on the day of discharge and was found to be in stable condition. Time coordinating discharge: 35 minutes including assessment and coordination of care, as well as examination of the patient.   SIGNED:  Noralee Stain, DO [facility] 05/16/2023, 11:02 AM

## 2023-05-19 DIAGNOSIS — M6281 Muscle weakness (generalized): Secondary | ICD-10-CM | POA: Diagnosis not present

## 2023-05-19 DIAGNOSIS — R262 Difficulty in walking, not elsewhere classified: Secondary | ICD-10-CM | POA: Diagnosis not present

## 2023-05-19 DIAGNOSIS — S72142D Displaced intertrochanteric fracture of left femur, subsequent encounter for closed fracture with routine healing: Secondary | ICD-10-CM | POA: Diagnosis not present

## 2023-05-19 DIAGNOSIS — R2689 Other abnormalities of gait and mobility: Secondary | ICD-10-CM | POA: Diagnosis not present

## 2023-05-20 DIAGNOSIS — S72142D Displaced intertrochanteric fracture of left femur, subsequent encounter for closed fracture with routine healing: Secondary | ICD-10-CM | POA: Diagnosis not present

## 2023-05-20 DIAGNOSIS — E785 Hyperlipidemia, unspecified: Secondary | ICD-10-CM | POA: Diagnosis not present

## 2023-05-20 DIAGNOSIS — D649 Anemia, unspecified: Secondary | ICD-10-CM | POA: Diagnosis not present

## 2023-05-20 DIAGNOSIS — K219 Gastro-esophageal reflux disease without esophagitis: Secondary | ICD-10-CM | POA: Diagnosis not present

## 2023-05-20 DIAGNOSIS — I509 Heart failure, unspecified: Secondary | ICD-10-CM | POA: Diagnosis not present

## 2023-05-20 DIAGNOSIS — R531 Weakness: Secondary | ICD-10-CM | POA: Diagnosis not present

## 2023-05-20 DIAGNOSIS — I10 Essential (primary) hypertension: Secondary | ICD-10-CM | POA: Diagnosis not present

## 2023-05-20 DIAGNOSIS — N182 Chronic kidney disease, stage 2 (mild): Secondary | ICD-10-CM | POA: Diagnosis not present

## 2023-05-20 IMAGING — CT CT L SPINE W/O CM
3 series · 12 of 33 positions shown, 14 images · non-contrast
Comparison: Lumbar spine radiographs 09/07/2021

CLINICAL DATA: Fell. Compression fracture.



[Series 4: l spine st · axial · 0.29mm/px · z∈[-250,-96]mm · 4 of 113 slices shown, 5 images]
[im 18/113  soft-tissue]
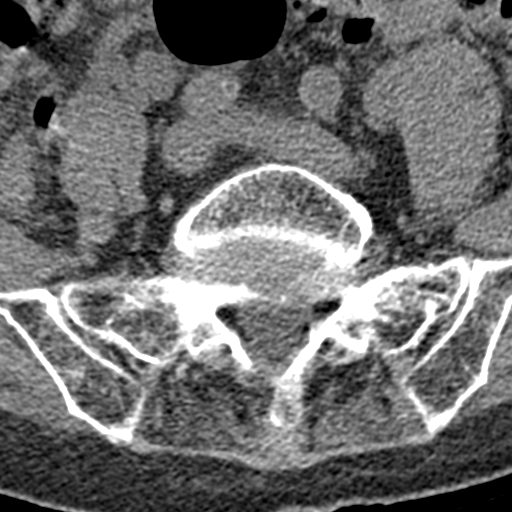
[im 18/113  bone]
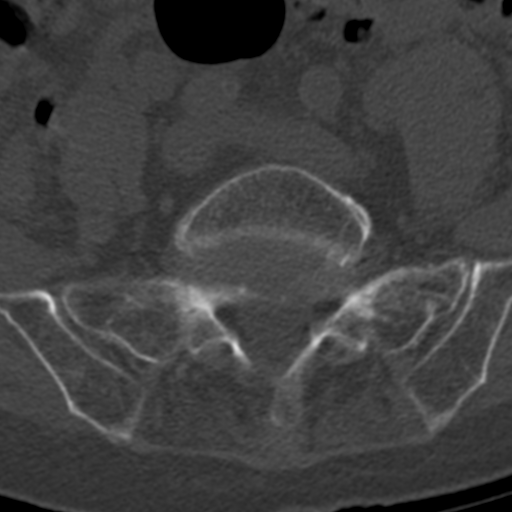
[im 44/113  bone]
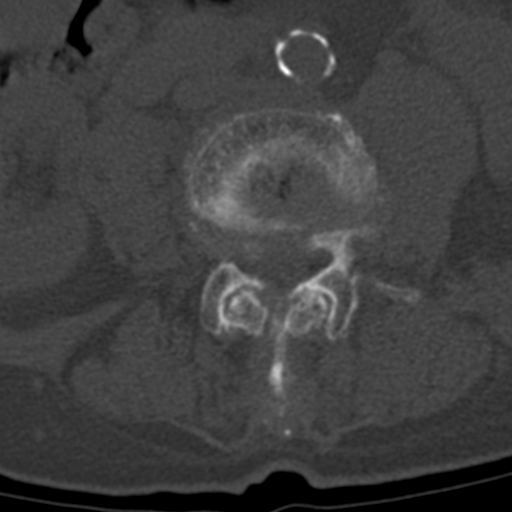
[im 69/113  bone]
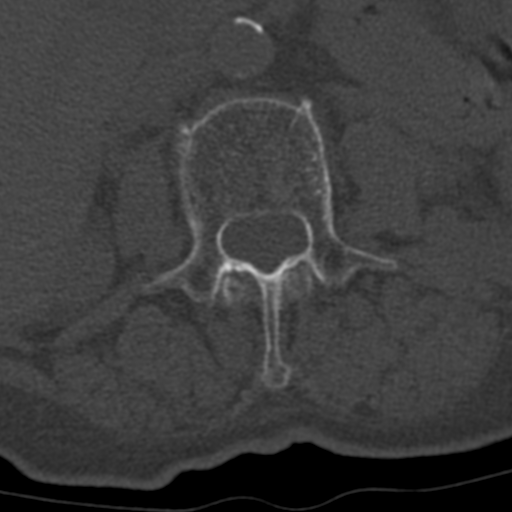
[im 95/113  bone]
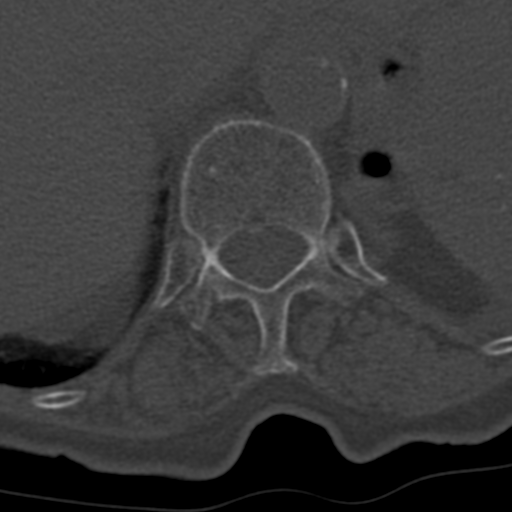

[Series 8: coronal bone · coronal · 0.33mm/px · 3 of 65 slices shown]
[im 13/65  bone]
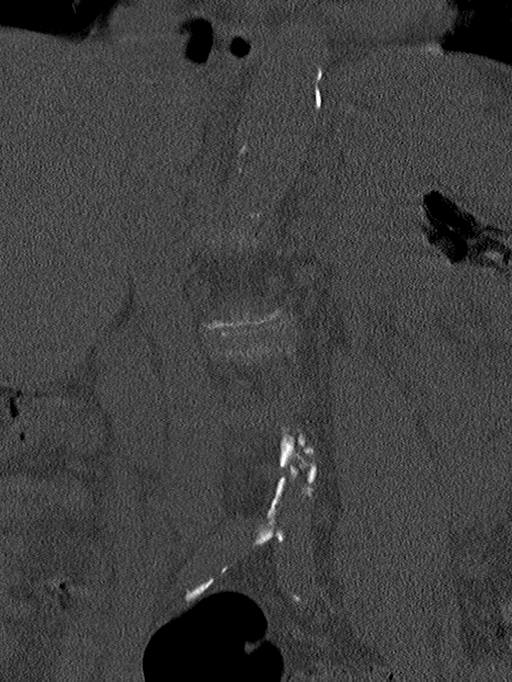
[im 26/65  bone]
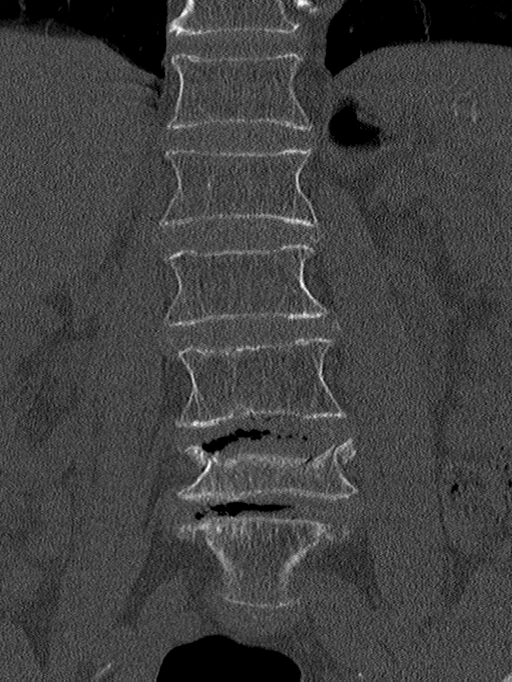
[im 39/65  bone]
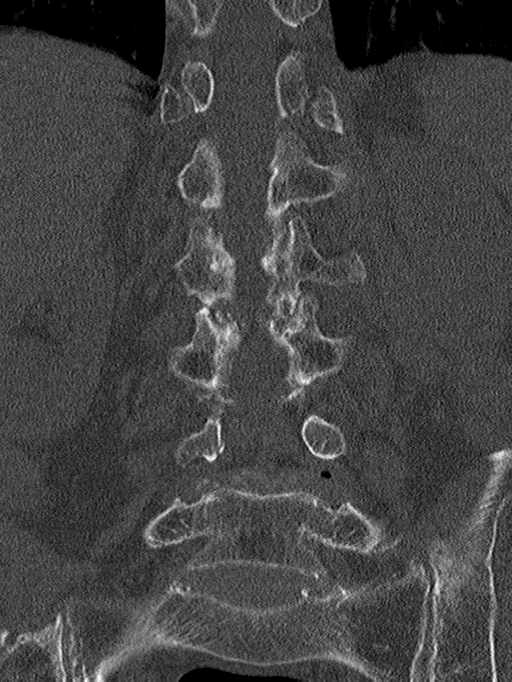

[Series 10: sagittal st · sagittal · 0.33mm/px · 5 of 61 slices shown, 6 images]
[im 21/61  bone]
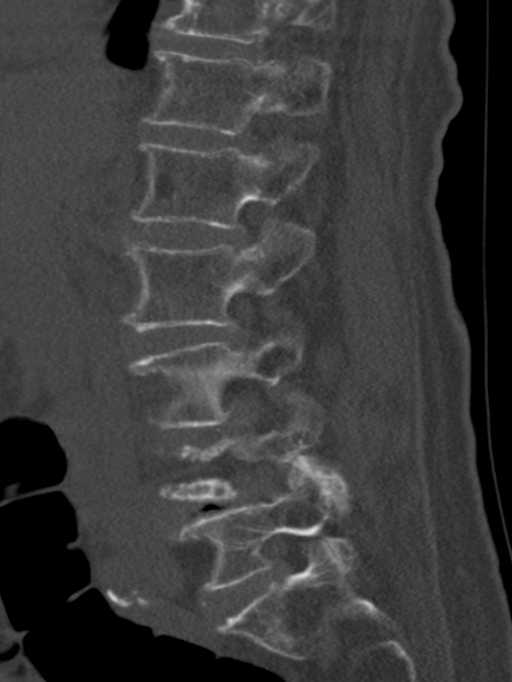
[im 26/61  bone]
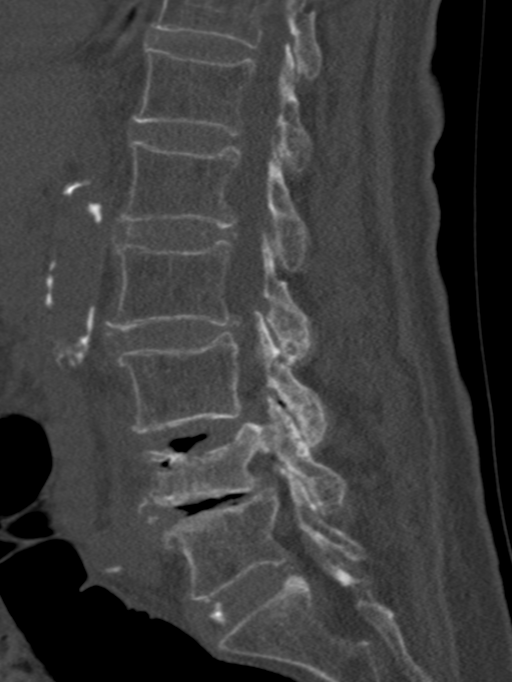
[im 31/61  soft-tissue]
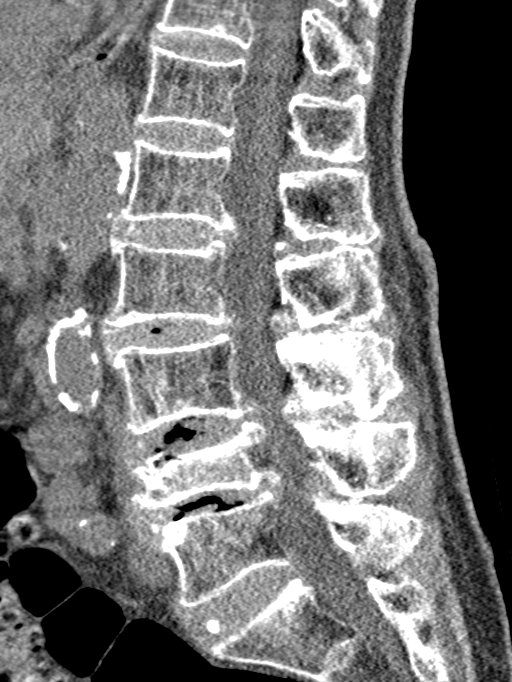
[im 31/61  bone]
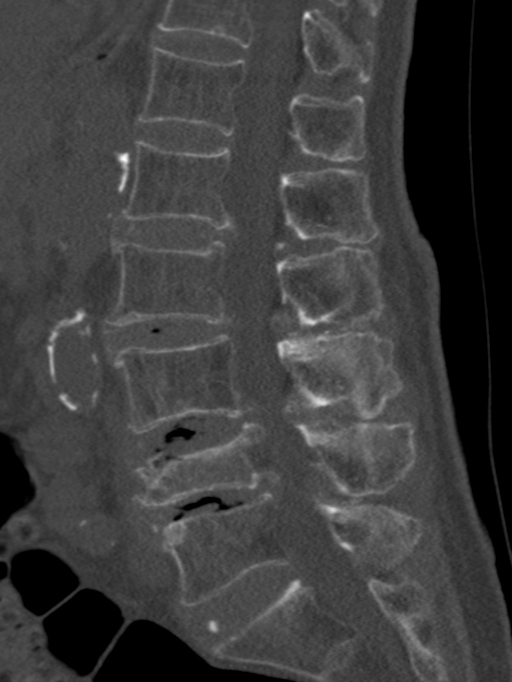
[im 36/61  bone]
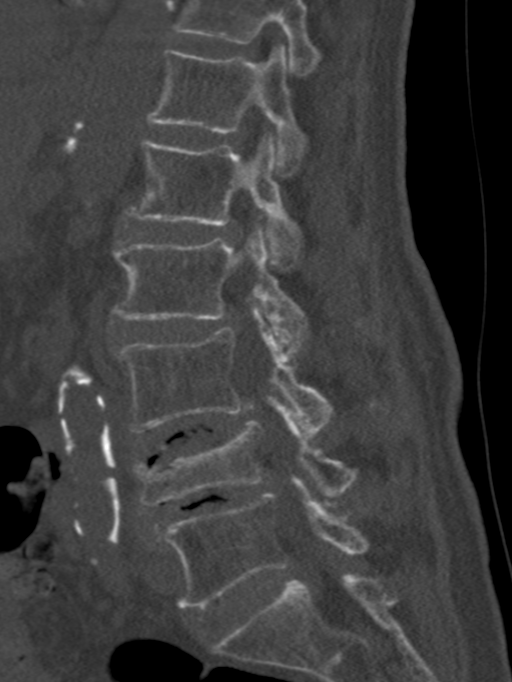
[im 41/61  bone]
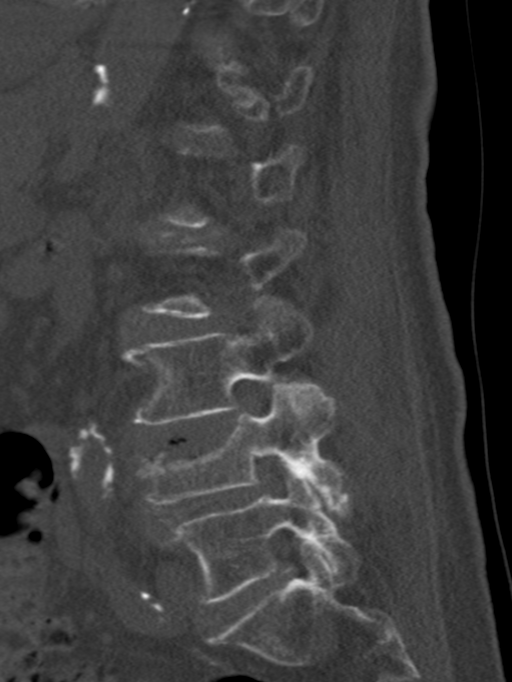

[12 of 33 positions shown; findings below may reference images not displayed]

FINDINGS: Segmentation: There are five lumbar type vertebral bodies. The last
full intervertebral disc space is labeled L5-S1.

Alignment: Normal overall alignment.

Vertebrae: Diffuse osteoporosis. There is a 60% compression fracture
the L4 vertebral body. Associated retropulsion. The fracture does
involve the pedicles bilaterally and could potentially be unstable.
The facets are maintained. No other fractures are identified.

Paraspinal and other soft tissues: Paraspinal hematoma at L4.
Advanced aortic calcifications but no aneurysm.

Disc levels:

T12-L1: No significant findings.

L1-2: Mild annular bulge and mild osteophytic ridging but no
significant spinal or foraminal stenosis.

L2-3: Left paracentral and foraminal disc protrusion contacting and
slightly displacing the left L2 nerve. There is also a diffuse
bulging annulus and moderate facet disease contributing to mild
spinal and bilateral lateral recess stenosis.

L3-4: Mild retropulsion the L4 fracture in combination with a
bulging degenerated annulus and osteophytic ridging contributing to
moderate spinal and bilateral lateral recess stenosis and mild
bilateral foraminal stenosis, left greater than right.

L4-5: Bulging degenerated annulus, osteophytic ridging, facet
disease and ligamentum flavum thickening all contributing to severe
spinal and bilateral lateral recess stenosis and moderate bilateral
foraminal stenosis.

L5-S1: No significant findings.
IMPRESSION: 1. Acute or subacute 60% compression fracture of L4. The fracture
does involve the pedicles bilaterally and could potentially be
unstable.
2. Severe multifactorial spinal and bilateral lateral recess
stenosis and moderate bilateral foraminal stenosis at L4-5.
3. Moderate spinal and bilateral lateral recess stenosis and mild
bilateral foraminal stenosis at L3-4.
4. Left paracentral and foraminal disc protrusion at L2-3 contacting
and slightly displacing the left L2 nerve.

Aortic Atherosclerosis ([id number]).

## 2023-05-20 IMAGING — DX DG HIP (WITH OR WITHOUT PELVIS) 2-3V*R*
3 series · 3 of 3 positions shown · non-contrast
Comparison: 10/26/2019 from [REDACTED]

CLINICAL DATA: Right hip pain without trauma.

EXAM:
DG HIP (WITH OR WITHOUT PELVIS) 2-3V RIGHT

[pelvis ap]
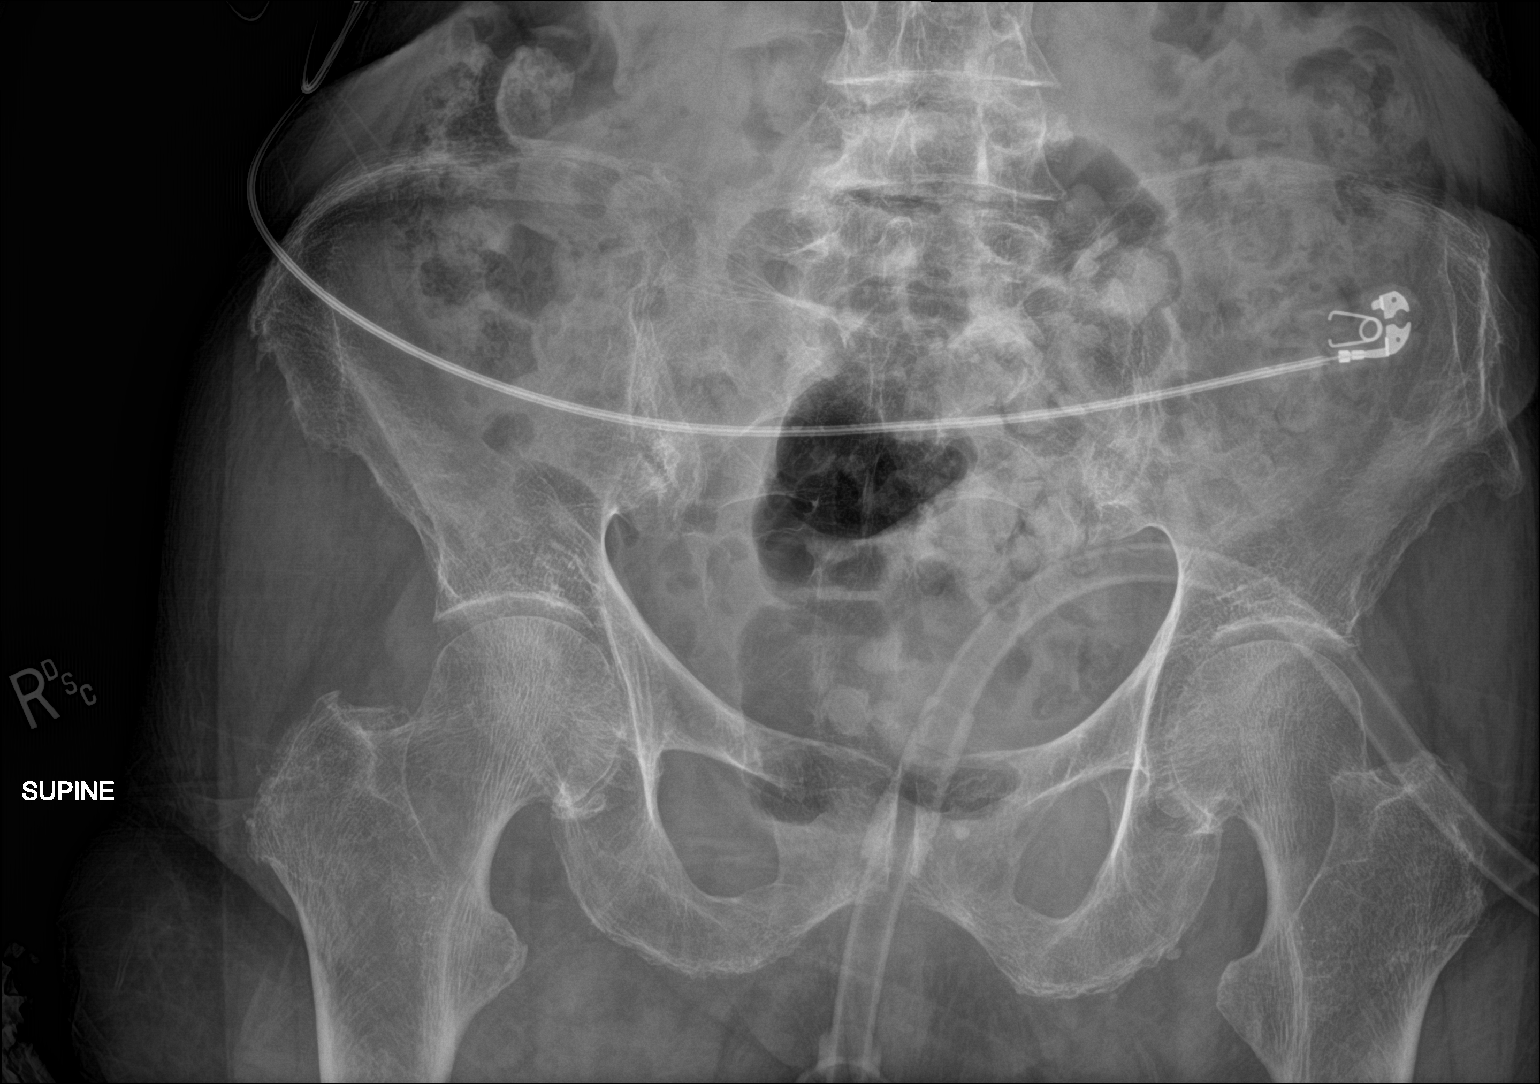

[hip ap]
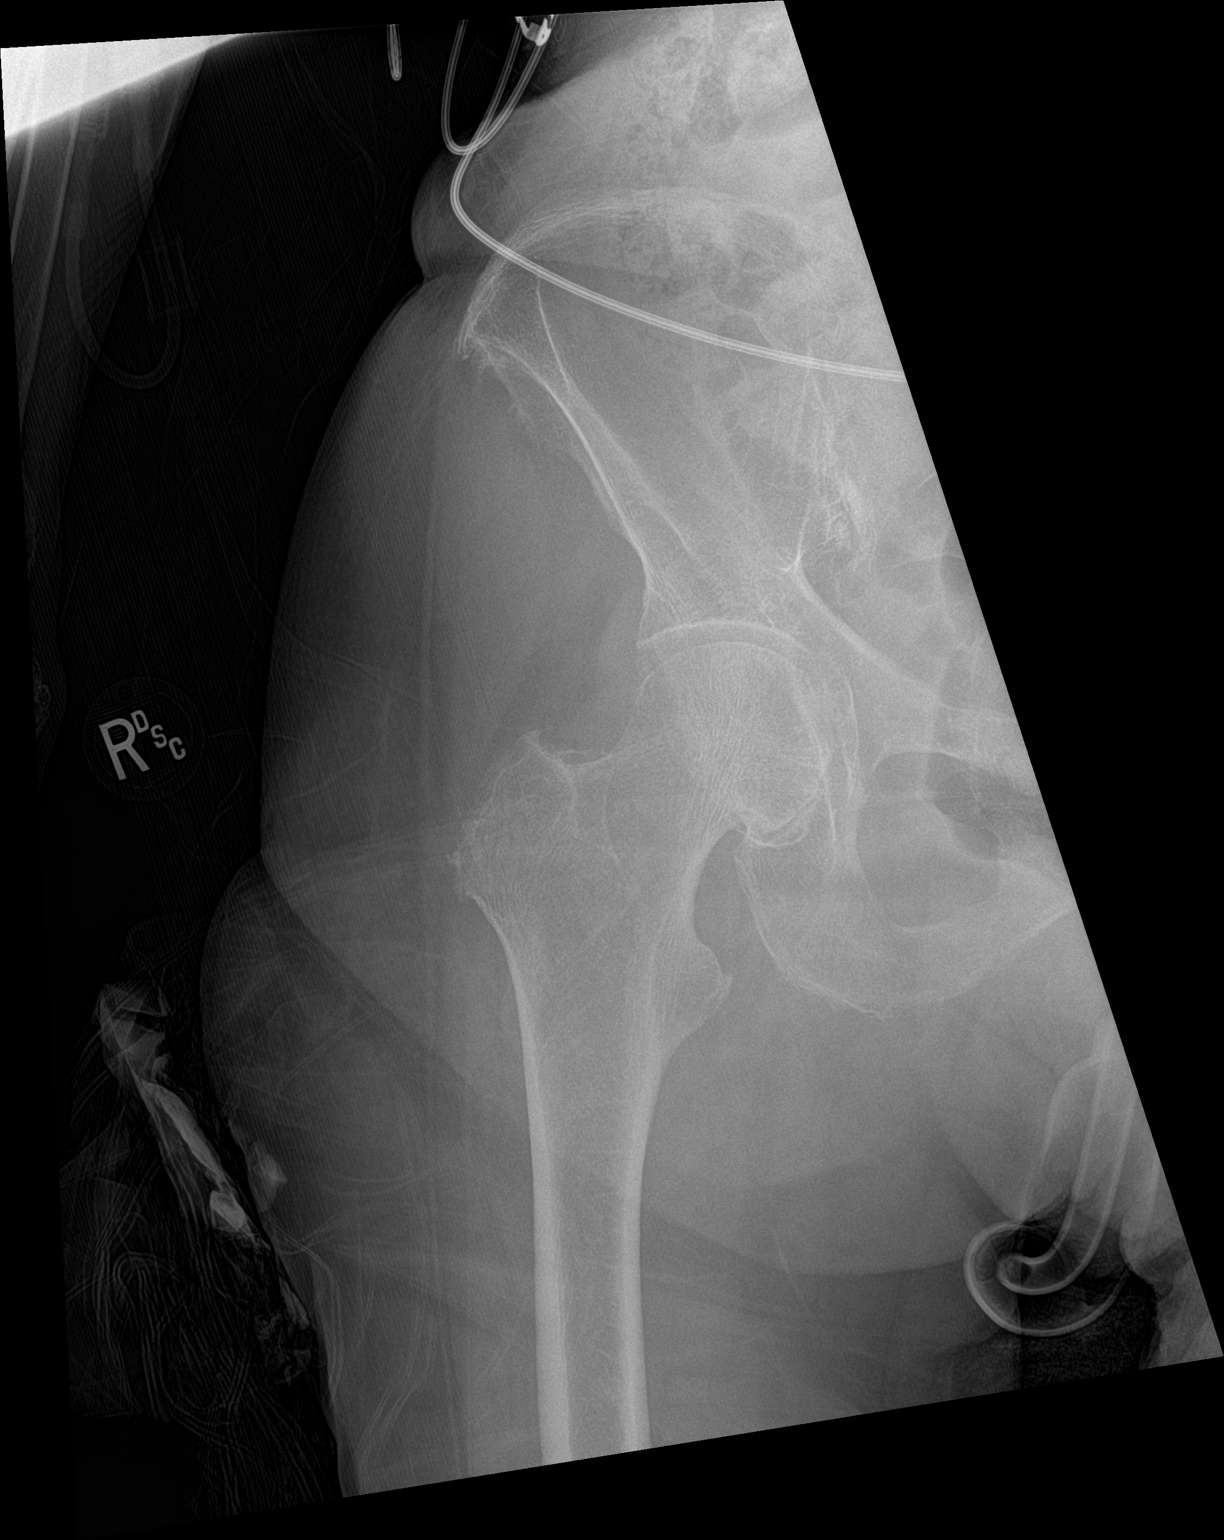

[hip lat]
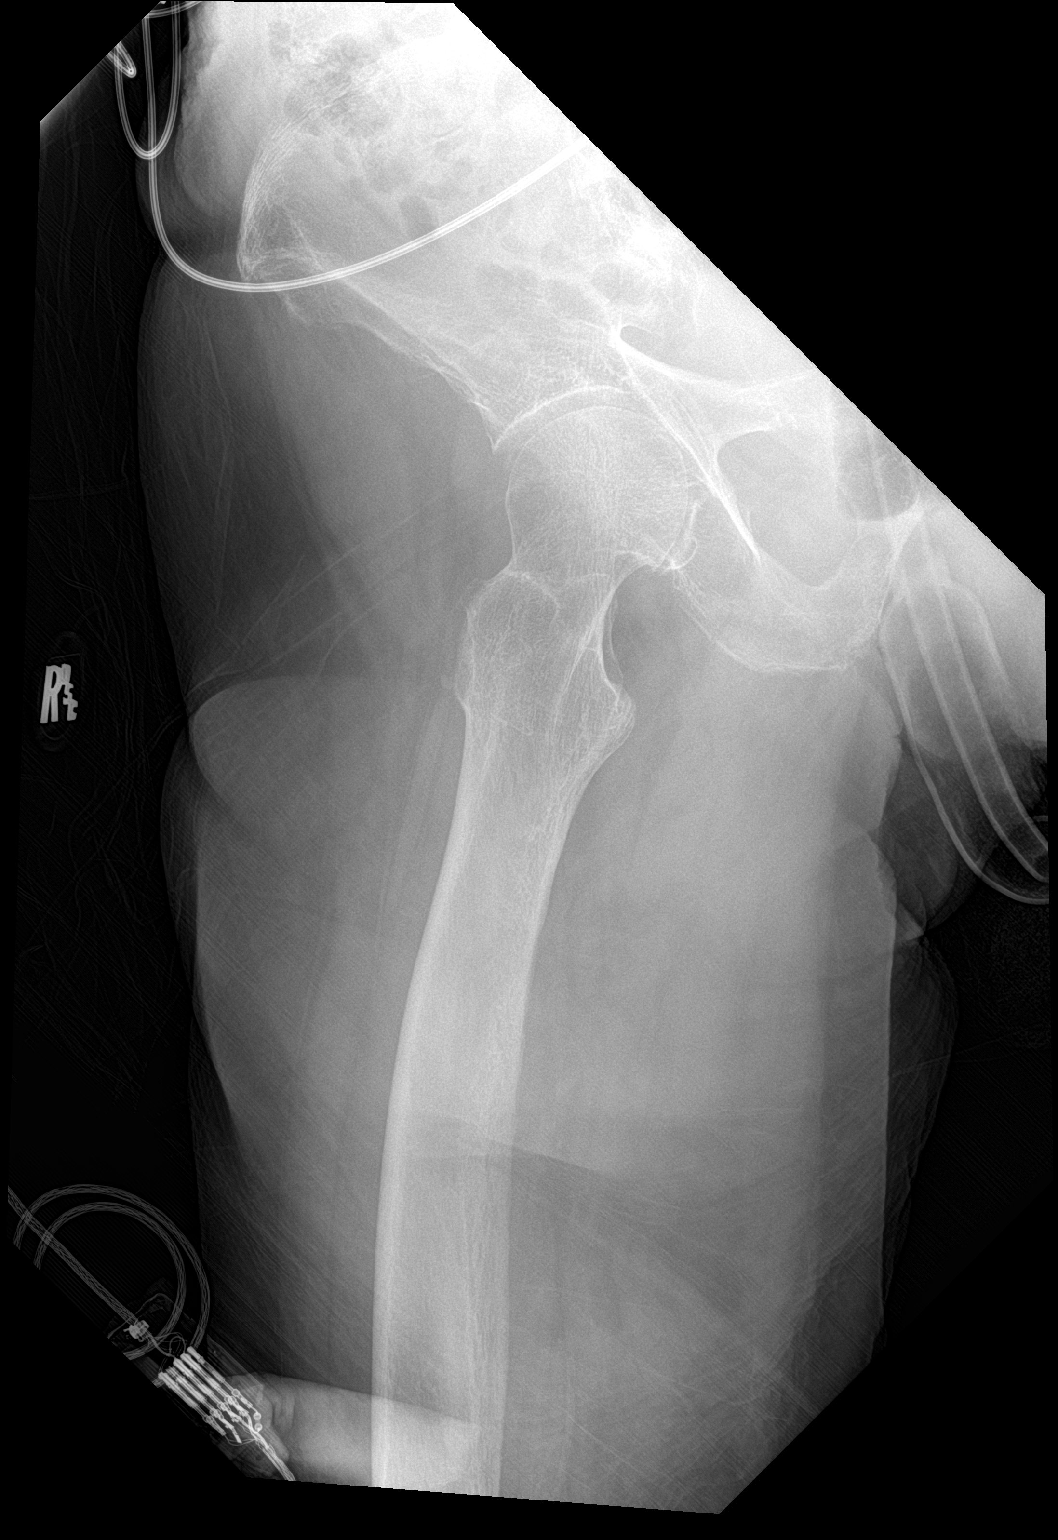

[3 of 3 positions shown; findings below may reference images not displayed]

FINDINGS: AP view of the pelvis and AP/frog leg views of the right hip.
Femoral heads are located. Osteopenia. Joint spaces are maintained
for age. No acute fracture. Lumbosacral spondylosis, suboptimally
evaluated.
IMPRESSION: No acute osseous abnormality.

## 2023-05-21 DIAGNOSIS — E785 Hyperlipidemia, unspecified: Secondary | ICD-10-CM | POA: Diagnosis not present

## 2023-05-21 DIAGNOSIS — N182 Chronic kidney disease, stage 2 (mild): Secondary | ICD-10-CM | POA: Diagnosis not present

## 2023-05-21 DIAGNOSIS — R531 Weakness: Secondary | ICD-10-CM | POA: Diagnosis not present

## 2023-05-21 DIAGNOSIS — I10 Essential (primary) hypertension: Secondary | ICD-10-CM | POA: Diagnosis not present

## 2023-05-21 DIAGNOSIS — K219 Gastro-esophageal reflux disease without esophagitis: Secondary | ICD-10-CM | POA: Diagnosis not present

## 2023-05-21 DIAGNOSIS — S72142D Displaced intertrochanteric fracture of left femur, subsequent encounter for closed fracture with routine healing: Secondary | ICD-10-CM | POA: Diagnosis not present

## 2023-05-21 DIAGNOSIS — D649 Anemia, unspecified: Secondary | ICD-10-CM | POA: Diagnosis not present

## 2023-05-22 DIAGNOSIS — M6281 Muscle weakness (generalized): Secondary | ICD-10-CM | POA: Diagnosis not present

## 2023-05-22 DIAGNOSIS — S72142D Displaced intertrochanteric fracture of left femur, subsequent encounter for closed fracture with routine healing: Secondary | ICD-10-CM | POA: Diagnosis not present

## 2023-05-22 DIAGNOSIS — R262 Difficulty in walking, not elsewhere classified: Secondary | ICD-10-CM | POA: Diagnosis not present

## 2023-05-22 DIAGNOSIS — R2689 Other abnormalities of gait and mobility: Secondary | ICD-10-CM | POA: Diagnosis not present

## 2023-05-29 DIAGNOSIS — R2689 Other abnormalities of gait and mobility: Secondary | ICD-10-CM | POA: Diagnosis not present

## 2023-05-29 DIAGNOSIS — R262 Difficulty in walking, not elsewhere classified: Secondary | ICD-10-CM | POA: Diagnosis not present

## 2023-05-29 DIAGNOSIS — S72142D Displaced intertrochanteric fracture of left femur, subsequent encounter for closed fracture with routine healing: Secondary | ICD-10-CM | POA: Diagnosis not present

## 2023-05-29 DIAGNOSIS — M6281 Muscle weakness (generalized): Secondary | ICD-10-CM | POA: Diagnosis not present

## 2023-06-02 DIAGNOSIS — R262 Difficulty in walking, not elsewhere classified: Secondary | ICD-10-CM | POA: Diagnosis not present

## 2023-06-02 DIAGNOSIS — S72142D Displaced intertrochanteric fracture of left femur, subsequent encounter for closed fracture with routine healing: Secondary | ICD-10-CM | POA: Diagnosis not present

## 2023-06-02 DIAGNOSIS — R2689 Other abnormalities of gait and mobility: Secondary | ICD-10-CM | POA: Diagnosis not present

## 2023-06-02 DIAGNOSIS — M6281 Muscle weakness (generalized): Secondary | ICD-10-CM | POA: Diagnosis not present

## 2023-06-05 DIAGNOSIS — R2689 Other abnormalities of gait and mobility: Secondary | ICD-10-CM | POA: Diagnosis not present

## 2023-06-05 DIAGNOSIS — S72142D Displaced intertrochanteric fracture of left femur, subsequent encounter for closed fracture with routine healing: Secondary | ICD-10-CM | POA: Diagnosis not present

## 2023-06-05 DIAGNOSIS — M6281 Muscle weakness (generalized): Secondary | ICD-10-CM | POA: Diagnosis not present

## 2023-06-05 DIAGNOSIS — R262 Difficulty in walking, not elsewhere classified: Secondary | ICD-10-CM | POA: Diagnosis not present

## 2023-06-09 DIAGNOSIS — M6281 Muscle weakness (generalized): Secondary | ICD-10-CM | POA: Diagnosis not present

## 2023-06-09 DIAGNOSIS — R2689 Other abnormalities of gait and mobility: Secondary | ICD-10-CM | POA: Diagnosis not present

## 2023-06-09 DIAGNOSIS — R262 Difficulty in walking, not elsewhere classified: Secondary | ICD-10-CM | POA: Diagnosis not present

## 2023-06-09 DIAGNOSIS — S72142D Displaced intertrochanteric fracture of left femur, subsequent encounter for closed fracture with routine healing: Secondary | ICD-10-CM | POA: Diagnosis not present

## 2023-06-12 DIAGNOSIS — M6281 Muscle weakness (generalized): Secondary | ICD-10-CM | POA: Diagnosis not present

## 2023-06-12 DIAGNOSIS — S72142D Displaced intertrochanteric fracture of left femur, subsequent encounter for closed fracture with routine healing: Secondary | ICD-10-CM | POA: Diagnosis not present

## 2023-06-12 DIAGNOSIS — R262 Difficulty in walking, not elsewhere classified: Secondary | ICD-10-CM | POA: Diagnosis not present

## 2023-06-12 DIAGNOSIS — R2689 Other abnormalities of gait and mobility: Secondary | ICD-10-CM | POA: Diagnosis not present

## 2023-06-13 DIAGNOSIS — D649 Anemia, unspecified: Secondary | ICD-10-CM | POA: Diagnosis not present

## 2023-06-13 DIAGNOSIS — R600 Localized edema: Secondary | ICD-10-CM | POA: Diagnosis not present

## 2023-06-13 DIAGNOSIS — S72142D Displaced intertrochanteric fracture of left femur, subsequent encounter for closed fracture with routine healing: Secondary | ICD-10-CM | POA: Diagnosis not present

## 2023-06-13 DIAGNOSIS — I10 Essential (primary) hypertension: Secondary | ICD-10-CM | POA: Diagnosis not present

## 2023-06-13 DIAGNOSIS — N182 Chronic kidney disease, stage 2 (mild): Secondary | ICD-10-CM | POA: Diagnosis not present

## 2023-06-16 DIAGNOSIS — R2689 Other abnormalities of gait and mobility: Secondary | ICD-10-CM | POA: Diagnosis not present

## 2023-06-16 DIAGNOSIS — S72142D Displaced intertrochanteric fracture of left femur, subsequent encounter for closed fracture with routine healing: Secondary | ICD-10-CM | POA: Diagnosis not present

## 2023-06-16 DIAGNOSIS — M6281 Muscle weakness (generalized): Secondary | ICD-10-CM | POA: Diagnosis not present

## 2023-06-16 DIAGNOSIS — R262 Difficulty in walking, not elsewhere classified: Secondary | ICD-10-CM | POA: Diagnosis not present

## 2023-06-17 DIAGNOSIS — S72142D Displaced intertrochanteric fracture of left femur, subsequent encounter for closed fracture with routine healing: Secondary | ICD-10-CM | POA: Diagnosis not present

## 2023-06-19 DIAGNOSIS — S72142D Displaced intertrochanteric fracture of left femur, subsequent encounter for closed fracture with routine healing: Secondary | ICD-10-CM | POA: Diagnosis not present

## 2023-06-19 DIAGNOSIS — I130 Hypertensive heart and chronic kidney disease with heart failure and stage 1 through stage 4 chronic kidney disease, or unspecified chronic kidney disease: Secondary | ICD-10-CM | POA: Diagnosis not present

## 2023-06-19 DIAGNOSIS — N182 Chronic kidney disease, stage 2 (mild): Secondary | ICD-10-CM | POA: Diagnosis not present

## 2023-06-19 DIAGNOSIS — M6281 Muscle weakness (generalized): Secondary | ICD-10-CM | POA: Diagnosis not present

## 2023-06-19 DIAGNOSIS — R262 Difficulty in walking, not elsewhere classified: Secondary | ICD-10-CM | POA: Diagnosis not present

## 2023-06-19 DIAGNOSIS — R2689 Other abnormalities of gait and mobility: Secondary | ICD-10-CM | POA: Diagnosis not present

## 2023-06-20 ENCOUNTER — Other Ambulatory Visit: Payer: Self-pay

## 2023-06-20 ENCOUNTER — Emergency Department (HOSPITAL_COMMUNITY): Payer: Medicare HMO

## 2023-06-20 ENCOUNTER — Emergency Department (HOSPITAL_COMMUNITY)
Admission: EM | Admit: 2023-06-20 | Discharge: 2023-06-21 | Disposition: A | Discharge status: Skilled Nursing Facility | Payer: Medicare HMO | Attending: Emergency Medicine | Admitting: Emergency Medicine

## 2023-06-20 DIAGNOSIS — I700 Atherosclerosis of aorta: Secondary | ICD-10-CM | POA: Diagnosis not present

## 2023-06-20 DIAGNOSIS — Z79899 Other long term (current) drug therapy: Secondary | ICD-10-CM | POA: Diagnosis not present

## 2023-06-20 DIAGNOSIS — S0181XA Laceration without foreign body of other part of head, initial encounter: Secondary | ICD-10-CM | POA: Diagnosis not present

## 2023-06-20 DIAGNOSIS — N189 Chronic kidney disease, unspecified: Secondary | ICD-10-CM | POA: Diagnosis not present

## 2023-06-20 DIAGNOSIS — M19041 Primary osteoarthritis, right hand: Secondary | ICD-10-CM | POA: Diagnosis not present

## 2023-06-20 DIAGNOSIS — I129 Hypertensive chronic kidney disease with stage 1 through stage 4 chronic kidney disease, or unspecified chronic kidney disease: Secondary | ICD-10-CM | POA: Diagnosis not present

## 2023-06-20 DIAGNOSIS — R519 Headache, unspecified: Secondary | ICD-10-CM | POA: Diagnosis not present

## 2023-06-20 DIAGNOSIS — W0110XA Fall on same level from slipping, tripping and stumbling with subsequent striking against unspecified object, initial encounter: Secondary | ICD-10-CM | POA: Insufficient documentation

## 2023-06-20 DIAGNOSIS — S0101XA Laceration without foreign body of scalp, initial encounter: Secondary | ICD-10-CM | POA: Diagnosis not present

## 2023-06-20 DIAGNOSIS — R0602 Shortness of breath: Secondary | ICD-10-CM | POA: Insufficient documentation

## 2023-06-20 DIAGNOSIS — S0240EA Zygomatic fracture, right side, initial encounter for closed fracture: Secondary | ICD-10-CM | POA: Diagnosis not present

## 2023-06-20 DIAGNOSIS — G9389 Other specified disorders of brain: Secondary | ICD-10-CM | POA: Diagnosis not present

## 2023-06-20 DIAGNOSIS — S61210A Laceration without foreign body of right index finger without damage to nail, initial encounter: Secondary | ICD-10-CM | POA: Insufficient documentation

## 2023-06-20 DIAGNOSIS — S0993XA Unspecified injury of face, initial encounter: Secondary | ICD-10-CM | POA: Diagnosis present

## 2023-06-20 DIAGNOSIS — W19XXXA Unspecified fall, initial encounter: Secondary | ICD-10-CM

## 2023-06-20 DIAGNOSIS — I517 Cardiomegaly: Secondary | ICD-10-CM | POA: Diagnosis not present

## 2023-06-20 LAB — CBC WITH DIFFERENTIAL/PLATELET
Abs Immature Granulocytes: 0.05 10*3/uL (ref 0.00–0.07)
Basophils Absolute: 0 10*3/uL (ref 0.0–0.1)
Basophils Relative: 0 %
Eosinophils Absolute: 0 10*3/uL (ref 0.0–0.5)
Eosinophils Relative: 0 %
HCT: 39.3 % (ref 36.0–46.0)
Hemoglobin: 13.1 g/dL (ref 12.0–15.0)
Immature Granulocytes: 1 %
Lymphocytes Relative: 7 %
Lymphs Abs: 0.8 10*3/uL (ref 0.7–4.0)
MCH: 33.7 pg (ref 26.0–34.0)
MCHC: 33.3 g/dL (ref 30.0–36.0)
MCV: 101 fL — ABNORMAL HIGH (ref 80.0–100.0)
Monocytes Absolute: 1.1 10*3/uL — ABNORMAL HIGH (ref 0.1–1.0)
Monocytes Relative: 10 %
Neutro Abs: 8.6 10*3/uL — ABNORMAL HIGH (ref 1.7–7.7)
Neutrophils Relative %: 82 %
Platelets: 213 10*3/uL (ref 150–400)
RBC: 3.89 MIL/uL (ref 3.87–5.11)
RDW: 14.4 % (ref 11.5–15.5)
WBC: 10.5 10*3/uL (ref 4.0–10.5)
nRBC: 0 % (ref 0.0–0.2)

## 2023-06-20 LAB — BASIC METABOLIC PANEL
Anion gap: 13 (ref 5–15)
BUN: 23 mg/dL (ref 8–23)
CO2: 27 mmol/L (ref 22–32)
Calcium: 9.3 mg/dL (ref 8.9–10.3)
Chloride: 96 mmol/L — ABNORMAL LOW (ref 98–111)
Creatinine, Ser: 0.73 mg/dL (ref 0.44–1.00)
GFR, Estimated: >60 mL/min (ref >60)
Glucose, Bld: 122 mg/dL — ABNORMAL HIGH (ref 70–99)
Potassium: 4.1 mmol/L (ref 3.5–5.1)
Sodium: 136 mmol/L (ref 135–145)

## 2023-06-20 LAB — PROTIME-INR
INR: 1 (ref 0.8–1.2)
Prothrombin Time: 12.9 s (ref 11.4–15.2)

## 2023-06-20 LAB — TYPE AND SCREEN
ABO/RH(D): O NEG
Antibody Screen: NEGATIVE

## 2023-06-20 MED ORDER — LIDOCAINE-EPINEPHRINE (PF) 2 %-1:200000 IJ SOLN
20.0000 mL | Freq: Once | INTRAMUSCULAR | Status: AC
  Administered 2023-06-20: 20 mL via INTRADERMAL
  Filled 2023-06-20: qty 20

## 2023-06-20 MED ORDER — BACITRACIN ZINC 500 UNIT/GM EX OINT
TOPICAL_OINTMENT | Freq: Once | CUTANEOUS | Status: AC
  Administered 2023-06-20: 1 via TOPICAL

## 2023-06-20 MED ORDER — LIDOCAINE-EPINEPHRINE-TETRACAINE (LET) TOPICAL GEL
3.0000 mL | Freq: Once | TOPICAL | Status: AC
  Administered 2023-06-20: 3 mL via TOPICAL
  Filled 2023-06-20: qty 3

## 2023-06-20 MED ORDER — LIDOCAINE HCL 2 % IJ SOLN
20.0000 mL | Freq: Once | INTRAMUSCULAR | Status: AC
  Administered 2023-06-20: 400 mg via INTRADERMAL
  Filled 2023-06-20: qty 20

## 2023-06-20 NOTE — Progress Notes (Signed)
CSW spoke to [facility] which is the patient's daughter's son in law/ POA. At this time the family would like the patient to go back to [facility]. Family states that she has not been officially DC from [facility] yet. Jake Shark the son in law also stated that he can drive the patient to the facility. Patient is staying over night due to the fall to ensure the patient is well enough to transport. The family also wants to make sure about how many more days the patient will have in the rehab,being though she has already been there for a few weeks. CSW did advise the family that the patient is medically clear and currently in border status, once TOC ensures the patient's return to [facility] family will transport.Family is currently at the patient's bedside, Please contact patient's daughter or son in law harold in which both number's are listed in the patient's chart. TOC will follow up in the AM.

## 2023-06-20 NOTE — Discharge Instructions (Signed)
Your history, exam, and evaluation today are consistent with soft tissue and skin injuries after the fall.  Your CT of the head and neck did not show acute fracture or intracranial bleeding.  You had a large facial laceration that was repaired by the facial trauma team.  They recommended follow-up in 10 days to have examined to make sure it is safe for suture removal.  You can do this with a PCP.  I also repair the laceration on your finger after the x-ray shows no fracture.  Please keep it in the splint so you do not rip the sutures out and also follow-up in about 10 days for removal.  There were sick sutures in there.  The x-ray of your chest did not show rib fracture but did show likely chronic compression fractures in your back.  Given your lack of back pain we agreed to hold on more extensive imaging of the back.  The rest of your workup was reassuring and we feel you are safe for outpatient management.

## 2023-06-20 NOTE — Progress Notes (Signed)
CSW attempted to call patient's daughter at [phone] N/A at this time. Patient consult is a recommendation for SNF. TOC will work up SNF in AM if the family is wanting SNF.   [place] Adalie Mand LCSW-A   06/20/2023 8:24 PM

## 2023-06-20 NOTE — ED Notes (Signed)
 PT at bedside.

## 2023-06-20 NOTE — ED Notes (Signed)
Pt to CT with this RN to monitor 

## 2023-06-20 NOTE — ED Triage Notes (Signed)
Pt BIB [facility] from [place]. Pt had a witnessed mechanical fall after tripping over her walker. Denies syncope or LOC after the fall. Pt is not on anticoagulants. EMS report a skin tear to R forehead. Pt is A/Ox4 on arrival.   EMS Vitals  170/80 HR 94 SpO2 96% on R/A CBG 115.  RR 16

## 2023-06-20 NOTE — ED Provider Notes (Addendum)
[place] [facility]ERGENCY DEPARTMENT AT [facility] Provider Note   CSN: [id number] Arrival date & time: 06/20/23  1355     History  Chief Complaint  Patient presents with   Lauren Davidson is a 89 y.o. female.  The history is provided by the patient and the EMS personnel. History limited by: hard of hearing. No language interpreter was used.  Fall This is a new problem. The current episode started less than 1 hour ago. The problem occurs rarely. The problem has not changed since onset.Associated symptoms include headaches and shortness of breath. Pertinent negatives include no chest pain and no abdominal pain. Nothing aggravates the symptoms. Nothing relieves the symptoms. She has tried nothing for the symptoms. The treatment provided no relief.       Home Medications Prior to Admission medications   Medication Sig Start Date End Date Taking? Authorizing Provider  acetaminophen (TYLENOL) 500 MG tablet Take 500 mg by mouth every 6 (six) hours as needed (for pain).    [provider]  amLODipine (NORVASC) 5 MG tablet Take 1 tablet (5 mg total) by mouth daily. 06/26/22   Meredeth Ide, MD  BIOTIN PO Take 30 mLs by mouth daily. Medication: Mouthwash    [provider]  Calcium Carb-Cholecalciferol (CALCIUM+D3 PO) Take 1 tablet by mouth daily with breakfast.    [provider]  docusate sodium (COLACE) 100 MG capsule Take 1 capsule (100 mg total) by mouth 2 (two) times daily. 09/20/21   Jerald Kief, MD  ferrous gluconate (FERGON) 324 MG tablet Take 1 tablet (324 mg total) by mouth daily with breakfast. 05/17/23   Noralee Stain, DO  ibuprofen (ADVIL) 200 MG tablet Take 200 mg by mouth every 6 (six) hours as needed for moderate pain (pain score 4-6) or mild pain (pain score 1-3).    [provider]  Multiple Vitamins-Minerals (PRESERVISION AREDS 2) CAPS Take 1 capsule by mouth in the morning and at bedtime.    [provider]   pantoprazole (PROTONIX) 40 MG tablet Take 40 mg by mouth every morning. 04/27/23   [provider]  SYSTANE ULTRA PF 0.4-0.3 % SOLN Place 1 drop into both eyes in the morning, at noon, in the evening, and at bedtime.    [provider]      Allergies    Statins, Buprenorphine hcl, Buspirone, Lexapro [escitalopram], Lovastatin, Meperidine, and Morphine and codeine    Review of Systems   Review of Systems  Constitutional:  Positive for fatigue. Negative for chills and fever.  HENT:  Negative for congestion.   Eyes:  Negative for pain and visual disturbance.  Respiratory:  Positive for shortness of breath. Negative for chest tightness and wheezing.   Cardiovascular:  Negative for chest pain, palpitations and leg swelling.  Gastrointestinal:  Negative for abdominal pain, constipation, diarrhea, nausea and vomiting.  Genitourinary:  Negative for dysuria and flank pain.  Musculoskeletal:  Negative for back pain, neck pain and neck stiffness.  Skin:  Positive for wound.  Neurological:  Positive for light-headedness and headaches. Negative for dizziness, weakness and numbness.  Psychiatric/Behavioral:  Negative for agitation and confusion.   All other systems reviewed and are negative.   Physical Exam Updated Vital Signs There were no vitals taken for this visit. Physical Exam Vitals and nursing note reviewed.  Constitutional:      General: She is not in acute distress.    Appearance: She is well-developed. She is not  ill-appearing, toxic-appearing or diaphoretic.  HENT:     Nose: No congestion or rhinorrhea.     Mouth/Throat:     Mouth: Mucous membranes are moist.     Pharynx: No oropharyngeal exudate or posterior oropharyngeal erythema.  Eyes:     Extraocular Movements: Extraocular movements intact.     Conjunctiva/sclera: Conjunctivae normal.     Pupils: Pupils are equal, round, and reactive to light.  Cardiovascular:     Rate and Rhythm: Normal rate and regular  rhythm.     Heart sounds: No murmur heard. Pulmonary:     Effort: Pulmonary effort is normal. No respiratory distress.     Breath sounds: Normal breath sounds. No wheezing, rhonchi or rales.  Chest:     Chest wall: No tenderness.  Abdominal:     General: Abdomen is flat.     Palpations: Abdomen is soft.     Tenderness: There is no abdominal tenderness. There is no guarding or rebound.  Musculoskeletal:        General: Tenderness and signs of injury present. No swelling.     Cervical back: Neck supple. No tenderness.  Skin:    General: Skin is warm and dry.     Capillary Refill: Capillary refill takes less than 2 seconds.  Neurological:     General: No focal deficit present.     Mental Status: She is alert.     Sensory: No sensory deficit.     Motor: No weakness.  Psychiatric:        Mood and Affect: Mood normal.     ED Results / Procedures / Treatments   Labs (all labs ordered are listed, but only abnormal results are displayed) Labs Reviewed  CBC WITH DIFFERENTIAL/PLATELET - Abnormal; Notable for the following components:      Result Value   MCV 101.0 (*)    Neutro Abs 8.6 (*)    Monocytes Absolute 1.1 (*)    All other components within normal limits  BASIC METABOLIC PANEL - Abnormal; Notable for the following components:   Chloride 96 (*)    Glucose, Bld 122 (*)    All other components within normal limits  PROTIME-INR  TYPE AND SCREEN    EKG None  Radiology CT HEAD WO CONTRAST ( ) Result Date: 06/20/2023 CLINICAL DATA:  Status post fall. EXAM: CT HEAD WITHOUT CONTRAST CT CERVICAL SPINE WITHOUT CONTRAST TECHNIQUE: Multidetector CT imaging of the head and cervical spine was performed following the standard protocol without intravenous contrast. Multiplanar CT image reconstructions of the cervical spine were also generated. RADIATION DOSE REDUCTION: This exam was performed according to the departmental dose-optimization program which includes automated exposure  control, adjustment of the mA and/or kV according to patient size and/or use of iterative reconstruction technique. COMPARISON:  05/10/2023 FINDINGS: CT HEAD FINDINGS Brain: No evidence of acute infarction, hemorrhage, hydrocephalus, extra-axial collection or mass lesion/mass effect. There is mild diffuse low-attenuation within the subcortical and periventricular white matter compatible with chronic microvascular disease. Unchanged calcification within the left frontal lobe subcortical white matter, image 15/3 measuring 5 mm. Vascular: No hyperdense vessel or unexpected calcification. Skull: No acute fracture.Remote deformity involving the posterolateral wall of the right maxillary sinus. Unchanged remote fracture involving the right zygomatic arch, nondisplaced, image 13/4. Sinuses/Orbits: No acute abnormality. Other:   Small right frontal scalp laceration/hematoma. CT CERVICAL SPINE FINDINGS Alignment: Normal. Skull base and vertebrae: No acute fracture. No primary bone lesion or focal pathologic process. Soft tissues and spinal canal: No prevertebral fluid  or swelling. No visible canal hematoma. Disc levels: Mild multilevel disc space narrowing and endplate spurring. Upper chest: Negative. Other: None IMPRESSION: 1. No acute intracranial abnormalities. 2. Chronic microvascular disease. 3. Small right frontal scalp laceration/hematoma. 4. No evidence for cervical spine fracture or subluxation. 5. Unchanged chronic fracture involving the right zygomatic arch, nondisplaced. Electronically Signed   By: Signa Kell M.D.   On: 06/20/2023 15:58   CT Cervical Spine Wo Contrast Result Date: 06/20/2023 CLINICAL DATA:  Status post fall. EXAM: CT HEAD WITHOUT CONTRAST CT CERVICAL SPINE WITHOUT CONTRAST TECHNIQUE: Multidetector CT imaging of the head and cervical spine was performed following the standard protocol without intravenous contrast. Multiplanar CT image reconstructions of the cervical spine were also  generated. RADIATION DOSE REDUCTION: This exam was performed according to the departmental dose-optimization program which includes automated exposure control, adjustment of the mA and/or kV according to patient size and/or use of iterative reconstruction technique. COMPARISON:  05/10/2023 FINDINGS: CT HEAD FINDINGS Brain: No evidence of acute infarction, hemorrhage, hydrocephalus, extra-axial collection or mass lesion/mass effect. There is mild diffuse low-attenuation within the subcortical and periventricular white matter compatible with chronic microvascular disease. Unchanged calcification within the left frontal lobe subcortical white matter, image 15/3 measuring 5 mm. Vascular: No hyperdense vessel or unexpected calcification. Skull: No acute fracture.Remote deformity involving the posterolateral wall of the right maxillary sinus. Unchanged remote fracture involving the right zygomatic arch, nondisplaced, image 13/4. Sinuses/Orbits: No acute abnormality. Other:   Small right frontal scalp laceration/hematoma. CT CERVICAL SPINE FINDINGS Alignment: Normal. Skull base and vertebrae: No acute fracture. No primary bone lesion or focal pathologic process. Soft tissues and spinal canal: No prevertebral fluid or swelling. No visible canal hematoma. Disc levels: Mild multilevel disc space narrowing and endplate spurring. Upper chest: Negative. Other: None IMPRESSION: 1. No acute intracranial abnormalities. 2. Chronic microvascular disease. 3. Small right frontal scalp laceration/hematoma. 4. No evidence for cervical spine fracture or subluxation. 5. Unchanged chronic fracture involving the right zygomatic arch, nondisplaced. Electronically Signed   By: Signa Kell M.D.   On: 06/20/2023 15:58   DG Chest Portable 1 View Result Date: 06/20/2023 CLINICAL DATA:  Fall.  Shortness of breath. EXAM: PORTABLE CHEST 1 VIEW COMPARISON:  Chest radiograph dated June 12, 2022. FINDINGS: The heart size is mildly enlarged. The  mediastinal contours are within normal limits. Aortic atherosclerosis. Chronic interstitial changes. Skin fold projects over the left lateral chest wall. No focal consolidation, sizeable pleural effusion, or pneumothorax. Diffuse osseous demineralization. No obvious displaced rib fracture identified. Status post vertebral augmentation of lower thoracic and upper lumbar vertebrae. Compression deformities of the visualized midthoracic vertebrae, appear new compared to the prior exam dated June 12, 2022. IMPRESSION: 1. Compression deformities of visualized midthoracic vertebrae, appear new compared to the prior exam dated June 12, 2022. 2. Status post vertebral augmentation of lower thoracic and upper lumbar vertebrae. 3. Chronic interstitial changes without acute infiltrate. Electronically Signed   By: Hart Robinsons M.D.   On: 06/20/2023 15:33   DG Finger Index Right Result Date: 06/20/2023 CLINICAL DATA:  Laceration to the right index finger after fall EXAM: RIGHT INDEX FINGER 3V COMPARISON:  None Available. FINDINGS: There is no evidence of fracture or dislocation. Degenerative changes, most pronounced at the index finger distal interphalangeal joint and thumb carpometacarpal joint. Soft tissues are unremarkable. No radiopaque foreign body. IMPRESSION: 1. No acute fracture or dislocation.  No radiopaque foreign body. 2. Degenerative changes, most pronounced at the index finger distal interphalangeal joint  and thumb carpometacarpal joint. Electronically Signed   By: Agustin Cree M.D.   On: 06/20/2023 15:23    Procedures .Laceration Repair  Date/Time: 06/20/2023 5:10 PM  Performed by: Heide Scales, MD Authorized by: Heide Scales, MD   Consent:    Consent obtained:  Verbal   Consent given by:  Patient   Risks, benefits, and alternatives were discussed: yes     Risks discussed:  Infection, pain and poor cosmetic result   Alternatives discussed:  No treatment Universal  protocol:    Imaging studies available: yes     Patient identity confirmed:  Verbally with patient and hospital-assigned identification number Anesthesia:    Anesthesia method:  Topical application   Topical anesthetic:  LET Laceration details:    Location:  Finger   Finger location:  R index finger   Length (cm):  1   Depth (mm):  2 Pre-procedure details:    Preparation:  Patient was prepped and draped in usual sterile fashion and imaging obtained to evaluate for foreign bodies Exploration:    Limited defect created (wound extended): no     Imaging obtained: x-ray     Imaging outcome: foreign body not noted     Wound exploration: wound explored through full range of motion and entire depth of wound visualized     Contaminated: no   Treatment:    Area cleansed with:  Chlorhexidine, saline and Shur-Clens   Amount of cleaning:  Standard   Irrigation solution:  Sterile water   Visualized foreign bodies/material removed: no     Debridement:  None   Undermining:  None Skin repair:    Repair method:  Sutures   Suture size:  6-0   Suture material:  Prolene   Suture technique:  Simple interrupted   Number of sutures:  6 Approximation:    Approximation:  Close Repair type:    Repair type:  Simple Post-procedure details:    Dressing:  Antibiotic ointment and splint for protection   Procedure completion:  Tolerated     Medications Ordered in ED Medications  bacitracin ointment (has no administration in time range)  lidocaine (XYLOCAINE) 2 % (with pres) injection 400 mg (400 mg Intradermal Given by Other 06/20/23 1557)  lidocaine-EPINEPHrine (XYLOCAINE W/EPI) 2 %-1:200000 (PF) injection 20 mL (20 mLs Intradermal Given by Other 06/20/23 1556)  lidocaine-EPINEPHrine-tetracaine (LET) topical gel (3 mLs Topical Given 06/20/23 1557)    ED Course/ Medical Decision Making/ A&P                                 Medical Decision Making Amount and/or Complexity of Data Reviewed Labs:  ordered. Radiology: ordered.  Risk OTC drugs. Prescription drug management.    ELLISYN ICENHOWER is a 89 y.o. female with a past medical history significant for hyperlipidemia, hard of hearing, anxiety, GERD, CKD, Sjogren syndrome, hypertension, and previous compression fractures presents with a fall.  According to EMS report, patient had a witnessed fall tripping over a walker and then hitting the ground and sustained injury to her right index finger and forehead.  She did not lose consciousness.  She reports no preceding symptoms and no had no fevers, chills, congestion, cough, nausea, vomiting, constipation, diarrhea, or urinary changes.  She is denying pain in her chest, abdomen or back.  She reports some mild fatigue and shortness of breath after the fall and thinks she lost a lot of blood  from bleeding on her forehead.    On arrival, patient has bandages in place.  Bandages were taken down and patient has a large flap-like wound on her forehead with some brief arterial bleeding.  With repositioning and pressure of the arterial bleeding stopped.  It was rebandaged with combat gauze and we will consult facial trauma surgery to help with the large complex wound.  The patient has a 1 cm laceration to the dorsum of her index finger.  She has good range of motion and has has intact sensation strength and cap refill.  Good pulses.  No tenderness of the wrist.  Will get x-ray of the finger and she will likely need some sutures.  Otherwise we will get x-ray of her chest due to the fall.  Will get some screening labs.  Will get a type and screen as well given her report that she think she lost a lot of blood causing her to feel mild shortness of breath and lightheadedness.  Facial trauma was able to come and repair the face.  They recommended follow-up in 10 days with a provider for suture removal.  They recommended bacitracin application to it.   CT of the head and neck did not show acute fracture or  intracranial bleeding.  Finger x-ray did not show fracture and x-ray of the chest did not show acute pneumonia or rib fracture.  It showed compression injuries to the spine that are age-indeterminate and patient denied any new back pain and on exam she had no tenderness on her back.  Doubt new back injury and patient agrees.  Patient's finger was repaired without difficulty and will be splinted so she cannot bend it.  Patient and friend accompanying her expressed concern for the patient going back to the same facility where she had a fall and patient thinks she has more ambulation needs than currently being provided to her.  Will consult transition of care team to have them see her and order a PT evaluation.  Patient may have difficulty using her right hand with her walker due to this new laceration and needing it to be splinted so does not rip the sutures open.  Anticipate recommendations from TOC.     Final Clinical Impression(s) / ED Diagnoses Final diagnoses:  Fall, initial encounter  Facial laceration, initial encounter  Laceration of right index finger, foreign body presence unspecified, nail damage status unspecified, initial encounter     Clinical Impression: 1. Fall, initial encounter   2. Facial laceration, initial encounter   3. Laceration of right index finger, foreign body presence unspecified, nail damage status unspecified, initial encounter     Disposition: Awaiting recommendations from [facility] for assistance with her facility and ambulation needs.  Anticipate discharge after evaluation by them.  This note was prepared with assistance of Conservation officer, historic buildings. Occasional wrong-word or sound-a-like substitutions may have occurred due to the inherent limitations of voice recognition software.     Kamil Mchaffie, Canary Brim, MD 06/20/23 1705    Rogenia Werntz, Canary Brim, MD 06/20/23 [phone]

## 2023-06-20 NOTE — ED Notes (Signed)
Brief changed & pt repositioned in bed

## 2023-06-20 NOTE — Evaluation (Signed)
Physical Therapy Evaluation Patient Details Name: Lauren Davidson MRN: [id number] DOB: Sep 20, 1921 Today's Date: 06/20/2023  History of Present Illness  89 y.o. female presents to [facility] hospital on 06/20/2023 after a fall. Pt with lacerations to forehead and R index finger. PMH includes HLD, HOH, anxiety, GERD, CKD, Sjorgen syndrome, HTN.  Clinical Impression  Pt presents to PT with deficits in functional mobility, gait, endurance, power. Pt is limited by pain at L shin from abrasion along with generalized soreness after recent fall. Pt requires assistance fro safety during transfers and ambulation. Pt now with splint on L index finger and will likely have more difficulty managing ADLs. PT recommends a return to short term inpatient rehab to improve endurance and to allow for further ADL training prior to return to ALF.        If plan is discharge home, recommend the following: A little help with walking and/or transfers;A lot of help with bathing/dressing/bathroom;Assistance with cooking/housework;Assist for transportation;Help with stairs or ramp for entrance   Can travel by private vehicle   Yes    Equipment Recommendations  (defer to post-acute setting)  Recommendations for Other Services       Functional Status Assessment Patient has had a recent decline in their functional status and demonstrates the ability to make significant improvements in function in a reasonable and predictable amount of time.     Precautions / Restrictions Precautions Precautions: Fall Restrictions Weight Bearing Restrictions Per Provider Order: No      Mobility  Bed Mobility Overal bed mobility: Needs Assistance Bed Mobility: Supine to Sit, Sit to Supine     Supine to sit: Contact guard Sit to supine: Contact guard assist        Transfers Overall transfer level: Needs assistance Equipment used: Rolling walker (2 wheels) Transfers: Sit to/from Stand Sit to Stand: Contact guard assist, From  elevated surface                Ambulation/Gait Ambulation/Gait assistance: Contact guard assist Gait Distance (Feet): 20 Feet Assistive device: Rolling walker (2 wheels) Gait Pattern/deviations: Step-to pattern Gait velocity: reduced Gait velocity interpretation: <1.31 ft/sec, indicative of household ambulator   General Gait Details: slowed step-to gait, increased trunk flexion  Stairs            Wheelchair Mobility     Tilt Bed    Modified Rankin (Stroke Patients Only)       Balance Overall balance assessment: Needs assistance Sitting-balance support: No upper extremity supported, Feet supported Sitting balance-Leahy Scale: Good     Standing balance support: Bilateral upper extremity supported, Reliant on assistive device for balance Standing balance-Leahy Scale: Poor                               Pertinent Vitals/Pain Pain Assessment Pain Assessment: Faces Faces Pain Scale: Hurts even more Pain Location: forehead Pain Descriptors / Indicators: Burning Pain Intervention(s): Monitored during session    Home Living Family/patient expects to be discharged to:: Skilled nursing facility                   Additional Comments: resides at [facility] but was at [facility] prior to this admission    Prior Function Prior Level of Function : Needs assist             Mobility Comments: amb with rollator, able to walk to dining room at ALF, 2 falls in last 2  months ADLs Comments: some assistance for bathing/dressing     Extremity/Trunk Assessment   Upper Extremity Assessment Upper Extremity Assessment: Overall WFL for tasks assessed    Lower Extremity Assessment Lower Extremity Assessment: Generalized weakness;LLE deficits/detail LLE Deficits / Details: pt with abrasion to L shin. RN made aware    Cervical / Trunk Assessment Cervical / Trunk Assessment: Kyphotic  Communication   Communication Communication: Hearing  impairment Cueing Techniques: Verbal cues  Cognition Arousal: Alert Behavior During Therapy: WFL for tasks assessed/performed Overall Cognitive Status: Within Functional Limits for tasks assessed                                          General Comments General comments (skin integrity, edema, etc.): VSS on RA    Exercises     Assessment/Plan    PT Assessment Patient needs continued PT services  PT Problem List Decreased strength;Decreased activity tolerance;Decreased balance;Decreased mobility;Decreased knowledge of use of DME;Decreased safety awareness;Decreased knowledge of precautions;Cardiopulmonary status limiting activity;Pain       PT Treatment Interventions DME instruction;Functional mobility training;Therapeutic activities;Therapeutic exercise;Balance training;Neuromuscular re-education;Patient/family education;Gait training    PT Goals (Current goals can be found in the Care Plan section)  Acute Rehab PT Goals Patient Stated Goal: to return to [facility] after further rehab PT Goal Formulation: With patient/family Time For Goal Achievement: 07/04/23 Potential to Achieve Goals: Fair    Frequency Min 1X/week     Co-evaluation               AM-PAC PT "6 Clicks" Mobility  Outcome Measure Help needed turning from your back to your side while in a flat bed without using bedrails?: A Little Help needed moving from lying on your back to sitting on the side of a flat bed without using bedrails?: A Little Help needed moving to and from a bed to a chair (including a wheelchair)?: A Little Help needed standing up from a chair using your arms (e.g., wheelchair or bedside chair)?: A Little Help needed to walk in hospital room?: A Little Help needed climbing 3-5 steps with a railing? : Total 6 Click Score: 16    End of Session   Activity Tolerance: Patient tolerated treatment well Patient left: in bed;with call bell/phone within reach;with  family/visitor present Nurse Communication: Mobility status PT Visit Diagnosis: Other abnormalities of gait and mobility (R26.89);Muscle weakness (generalized) (M62.81);Pain Pain - Right/Left: Left Pain - part of body: Leg    Time: 1610-9604 PT Time Calculation (min) (ACUTE ONLY): 32 min   Charges:   PT Evaluation $PT Eval Low Complexity: 1 Low   PT General Charges $$ ACUTE PT VISIT: 1 Visit         Arlyss Gandy, PT, DPT Acute Rehabilitation Office [phone]   Arlyss Gandy 06/20/2023, 6:14 PM

## 2023-06-21 NOTE — Progress Notes (Addendum)
CSW spoke with patient's son in law Jake Shark to discuss patient. Jake Shark states patient resides at [facility] but was at [facility] for rehab. Jake Shark states patient went to back to her apartment at [facility] for a trial run to determine if she needed further therapy from [facility] when she fell yesterday. Jake Shark is requesting patient return to [facility] for additional rehab. Jake Shark states he will be here in about an hour to pick patient up and take her to [facility].  CSW spoke with Lowella Bandy at [facility] who confirms patient can return to the facility - room 506. The number to call for report is [phone].  Edwin Dada, MSW, LCSW Transitions of Care  Clinical Social Worker II [phone]

## 2023-06-21 NOTE — ED Provider Notes (Signed)
Emergency Medicine Observation Re-evaluation Note  Lauren Davidson is a 89 y.o. female, seen on rounds today.  Pt initially presented to the ED for complaints of Fall Currently, the patient is awaiting transfer back to her facility.   Physical Exam  BP (!) 129/52   Pulse 63   Temp 98.7 F (37.1 C) (Oral)   Resp (!) 22   Wt 51.3 kg   SpO2 99%   BMI 19.40 kg/m  Physical Exam General: Calm Cardiac: Well perfused Lungs: Even respirations   ED Course / MDM  EKG:   I have reviewed the labs performed to date as well as medications administered while in observation.  Recent changes in the last 24 hours include patient accepted back to [facility]. Stable for discharge.  Plan  Current plan is for discharge.    Maia Plan, MD 06/21/23 [phone]

## 2023-06-23 DIAGNOSIS — S72142D Displaced intertrochanteric fracture of left femur, subsequent encounter for closed fracture with routine healing: Secondary | ICD-10-CM | POA: Diagnosis not present

## 2023-06-23 DIAGNOSIS — M6281 Muscle weakness (generalized): Secondary | ICD-10-CM | POA: Diagnosis not present

## 2023-06-23 DIAGNOSIS — R2689 Other abnormalities of gait and mobility: Secondary | ICD-10-CM | POA: Diagnosis not present

## 2023-06-23 DIAGNOSIS — R262 Difficulty in walking, not elsewhere classified: Secondary | ICD-10-CM | POA: Diagnosis not present

## 2023-06-25 DIAGNOSIS — R296 Repeated falls: Secondary | ICD-10-CM | POA: Diagnosis not present

## 2023-06-25 DIAGNOSIS — I10 Essential (primary) hypertension: Secondary | ICD-10-CM | POA: Diagnosis not present

## 2023-06-25 DIAGNOSIS — S61210A Laceration without foreign body of right index finger without damage to nail, initial encounter: Secondary | ICD-10-CM | POA: Diagnosis not present

## 2023-06-25 DIAGNOSIS — S72142D Displaced intertrochanteric fracture of left femur, subsequent encounter for closed fracture with routine healing: Secondary | ICD-10-CM | POA: Diagnosis not present

## 2023-06-25 DIAGNOSIS — N182 Chronic kidney disease, stage 2 (mild): Secondary | ICD-10-CM | POA: Diagnosis not present

## 2023-06-26 DIAGNOSIS — S72142D Displaced intertrochanteric fracture of left femur, subsequent encounter for closed fracture with routine healing: Secondary | ICD-10-CM | POA: Diagnosis not present

## 2023-06-26 DIAGNOSIS — R2689 Other abnormalities of gait and mobility: Secondary | ICD-10-CM | POA: Diagnosis not present

## 2023-06-26 DIAGNOSIS — R262 Difficulty in walking, not elsewhere classified: Secondary | ICD-10-CM | POA: Diagnosis not present

## 2023-06-26 DIAGNOSIS — M6281 Muscle weakness (generalized): Secondary | ICD-10-CM | POA: Diagnosis not present

## 2023-06-30 DIAGNOSIS — M6281 Muscle weakness (generalized): Secondary | ICD-10-CM | POA: Diagnosis not present

## 2023-06-30 DIAGNOSIS — S72142D Displaced intertrochanteric fracture of left femur, subsequent encounter for closed fracture with routine healing: Secondary | ICD-10-CM | POA: Diagnosis not present

## 2023-06-30 DIAGNOSIS — R2689 Other abnormalities of gait and mobility: Secondary | ICD-10-CM | POA: Diagnosis not present

## 2023-06-30 DIAGNOSIS — R262 Difficulty in walking, not elsewhere classified: Secondary | ICD-10-CM | POA: Diagnosis not present

## 2023-07-02 DIAGNOSIS — D649 Anemia, unspecified: Secondary | ICD-10-CM | POA: Diagnosis not present

## 2023-07-02 DIAGNOSIS — I10 Essential (primary) hypertension: Secondary | ICD-10-CM | POA: Diagnosis not present

## 2023-07-02 DIAGNOSIS — S61210A Laceration without foreign body of right index finger without damage to nail, initial encounter: Secondary | ICD-10-CM | POA: Diagnosis not present

## 2023-07-02 DIAGNOSIS — S72142D Displaced intertrochanteric fracture of left femur, subsequent encounter for closed fracture with routine healing: Secondary | ICD-10-CM | POA: Diagnosis not present

## 2023-07-02 DIAGNOSIS — R531 Weakness: Secondary | ICD-10-CM | POA: Diagnosis not present

## 2023-07-04 DIAGNOSIS — G8929 Other chronic pain: Secondary | ICD-10-CM | POA: Diagnosis not present

## 2023-07-04 DIAGNOSIS — S72142D Displaced intertrochanteric fracture of left femur, subsequent encounter for closed fracture with routine healing: Secondary | ICD-10-CM | POA: Diagnosis not present

## 2023-07-04 DIAGNOSIS — R262 Difficulty in walking, not elsewhere classified: Secondary | ICD-10-CM | POA: Diagnosis not present

## 2023-07-04 DIAGNOSIS — M6281 Muscle weakness (generalized): Secondary | ICD-10-CM | POA: Diagnosis not present

## 2023-07-04 DIAGNOSIS — R2689 Other abnormalities of gait and mobility: Secondary | ICD-10-CM | POA: Diagnosis not present

## 2023-07-04 DIAGNOSIS — Z9181 History of falling: Secondary | ICD-10-CM | POA: Diagnosis not present

## 2023-07-04 DIAGNOSIS — R2681 Unsteadiness on feet: Secondary | ICD-10-CM | POA: Diagnosis not present

## 2023-07-07 DIAGNOSIS — S81812D Laceration without foreign body, left lower leg, subsequent encounter: Secondary | ICD-10-CM | POA: Diagnosis not present

## 2023-07-07 DIAGNOSIS — Z9181 History of falling: Secondary | ICD-10-CM | POA: Diagnosis not present

## 2023-07-07 DIAGNOSIS — G8929 Other chronic pain: Secondary | ICD-10-CM | POA: Diagnosis not present

## 2023-07-07 DIAGNOSIS — N182 Chronic kidney disease, stage 2 (mild): Secondary | ICD-10-CM | POA: Diagnosis not present

## 2023-07-07 DIAGNOSIS — M3500 Sicca syndrome, unspecified: Secondary | ICD-10-CM | POA: Diagnosis not present

## 2023-07-07 DIAGNOSIS — I130 Hypertensive heart and chronic kidney disease with heart failure and stage 1 through stage 4 chronic kidney disease, or unspecified chronic kidney disease: Secondary | ICD-10-CM | POA: Diagnosis not present

## 2023-07-07 DIAGNOSIS — M545 Low back pain, unspecified: Secondary | ICD-10-CM | POA: Diagnosis not present

## 2023-07-07 DIAGNOSIS — D631 Anemia in chronic kidney disease: Secondary | ICD-10-CM | POA: Diagnosis not present

## 2023-07-07 DIAGNOSIS — I5032 Chronic diastolic (congestive) heart failure: Secondary | ICD-10-CM | POA: Diagnosis not present

## 2023-07-07 DIAGNOSIS — S72142D Displaced intertrochanteric fracture of left femur, subsequent encounter for closed fracture with routine healing: Secondary | ICD-10-CM | POA: Diagnosis not present

## 2023-07-08 DIAGNOSIS — S72142D Displaced intertrochanteric fracture of left femur, subsequent encounter for closed fracture with routine healing: Secondary | ICD-10-CM | POA: Diagnosis not present

## 2023-07-08 DIAGNOSIS — D631 Anemia in chronic kidney disease: Secondary | ICD-10-CM | POA: Diagnosis not present

## 2023-07-08 DIAGNOSIS — Z9181 History of falling: Secondary | ICD-10-CM | POA: Diagnosis not present

## 2023-07-08 DIAGNOSIS — I5032 Chronic diastolic (congestive) heart failure: Secondary | ICD-10-CM | POA: Diagnosis not present

## 2023-07-08 DIAGNOSIS — I130 Hypertensive heart and chronic kidney disease with heart failure and stage 1 through stage 4 chronic kidney disease, or unspecified chronic kidney disease: Secondary | ICD-10-CM | POA: Diagnosis not present

## 2023-07-08 DIAGNOSIS — M545 Low back pain, unspecified: Secondary | ICD-10-CM | POA: Diagnosis not present

## 2023-07-08 DIAGNOSIS — G8929 Other chronic pain: Secondary | ICD-10-CM | POA: Diagnosis not present

## 2023-07-08 DIAGNOSIS — N182 Chronic kidney disease, stage 2 (mild): Secondary | ICD-10-CM | POA: Diagnosis not present

## 2023-07-08 DIAGNOSIS — M3500 Sicca syndrome, unspecified: Secondary | ICD-10-CM | POA: Diagnosis not present

## 2023-07-08 DIAGNOSIS — S81812D Laceration without foreign body, left lower leg, subsequent encounter: Secondary | ICD-10-CM | POA: Diagnosis not present

## 2023-07-10 DIAGNOSIS — S72142A Displaced intertrochanteric fracture of left femur, initial encounter for closed fracture: Secondary | ICD-10-CM | POA: Diagnosis not present

## 2023-07-10 DIAGNOSIS — I130 Hypertensive heart and chronic kidney disease with heart failure and stage 1 through stage 4 chronic kidney disease, or unspecified chronic kidney disease: Secondary | ICD-10-CM | POA: Diagnosis not present

## 2023-07-10 DIAGNOSIS — N182 Chronic kidney disease, stage 2 (mild): Secondary | ICD-10-CM | POA: Diagnosis not present

## 2023-07-10 DIAGNOSIS — W010XXA Fall on same level from slipping, tripping and stumbling without subsequent striking against object, initial encounter: Secondary | ICD-10-CM | POA: Diagnosis not present

## 2023-07-15 DIAGNOSIS — S81812D Laceration without foreign body, left lower leg, subsequent encounter: Secondary | ICD-10-CM | POA: Diagnosis not present

## 2023-07-15 DIAGNOSIS — M3500 Sicca syndrome, unspecified: Secondary | ICD-10-CM | POA: Diagnosis not present

## 2023-07-15 DIAGNOSIS — I5032 Chronic diastolic (congestive) heart failure: Secondary | ICD-10-CM | POA: Diagnosis not present

## 2023-07-15 DIAGNOSIS — N182 Chronic kidney disease, stage 2 (mild): Secondary | ICD-10-CM | POA: Diagnosis not present

## 2023-07-15 DIAGNOSIS — D631 Anemia in chronic kidney disease: Secondary | ICD-10-CM | POA: Diagnosis not present

## 2023-07-15 DIAGNOSIS — G8929 Other chronic pain: Secondary | ICD-10-CM | POA: Diagnosis not present

## 2023-07-15 DIAGNOSIS — S72142D Displaced intertrochanteric fracture of left femur, subsequent encounter for closed fracture with routine healing: Secondary | ICD-10-CM | POA: Diagnosis not present

## 2023-07-15 DIAGNOSIS — Z9181 History of falling: Secondary | ICD-10-CM | POA: Diagnosis not present

## 2023-07-15 DIAGNOSIS — I130 Hypertensive heart and chronic kidney disease with heart failure and stage 1 through stage 4 chronic kidney disease, or unspecified chronic kidney disease: Secondary | ICD-10-CM | POA: Diagnosis not present

## 2023-07-15 DIAGNOSIS — M545 Low back pain, unspecified: Secondary | ICD-10-CM | POA: Diagnosis not present

## 2023-07-17 DIAGNOSIS — M3500 Sicca syndrome, unspecified: Secondary | ICD-10-CM | POA: Diagnosis not present

## 2023-07-17 DIAGNOSIS — S81812D Laceration without foreign body, left lower leg, subsequent encounter: Secondary | ICD-10-CM | POA: Diagnosis not present

## 2023-07-17 DIAGNOSIS — I5032 Chronic diastolic (congestive) heart failure: Secondary | ICD-10-CM | POA: Diagnosis not present

## 2023-07-17 DIAGNOSIS — G8929 Other chronic pain: Secondary | ICD-10-CM | POA: Diagnosis not present

## 2023-07-17 DIAGNOSIS — N182 Chronic kidney disease, stage 2 (mild): Secondary | ICD-10-CM | POA: Diagnosis not present

## 2023-07-17 DIAGNOSIS — I130 Hypertensive heart and chronic kidney disease with heart failure and stage 1 through stage 4 chronic kidney disease, or unspecified chronic kidney disease: Secondary | ICD-10-CM | POA: Diagnosis not present

## 2023-07-17 DIAGNOSIS — M545 Low back pain, unspecified: Secondary | ICD-10-CM | POA: Diagnosis not present

## 2023-07-17 DIAGNOSIS — Z9181 History of falling: Secondary | ICD-10-CM | POA: Diagnosis not present

## 2023-07-17 DIAGNOSIS — S72142D Displaced intertrochanteric fracture of left femur, subsequent encounter for closed fracture with routine healing: Secondary | ICD-10-CM | POA: Diagnosis not present

## 2023-07-17 DIAGNOSIS — D631 Anemia in chronic kidney disease: Secondary | ICD-10-CM | POA: Diagnosis not present

## 2023-07-18 DIAGNOSIS — M3500 Sicca syndrome, unspecified: Secondary | ICD-10-CM | POA: Diagnosis not present

## 2023-07-18 DIAGNOSIS — Z9181 History of falling: Secondary | ICD-10-CM | POA: Diagnosis not present

## 2023-07-18 DIAGNOSIS — I5032 Chronic diastolic (congestive) heart failure: Secondary | ICD-10-CM | POA: Diagnosis not present

## 2023-07-18 DIAGNOSIS — S81812D Laceration without foreign body, left lower leg, subsequent encounter: Secondary | ICD-10-CM | POA: Diagnosis not present

## 2023-07-18 DIAGNOSIS — S72142D Displaced intertrochanteric fracture of left femur, subsequent encounter for closed fracture with routine healing: Secondary | ICD-10-CM | POA: Diagnosis not present

## 2023-07-18 DIAGNOSIS — N182 Chronic kidney disease, stage 2 (mild): Secondary | ICD-10-CM | POA: Diagnosis not present

## 2023-07-18 DIAGNOSIS — G8929 Other chronic pain: Secondary | ICD-10-CM | POA: Diagnosis not present

## 2023-07-18 DIAGNOSIS — D631 Anemia in chronic kidney disease: Secondary | ICD-10-CM | POA: Diagnosis not present

## 2023-07-18 DIAGNOSIS — I130 Hypertensive heart and chronic kidney disease with heart failure and stage 1 through stage 4 chronic kidney disease, or unspecified chronic kidney disease: Secondary | ICD-10-CM | POA: Diagnosis not present

## 2023-07-18 DIAGNOSIS — M545 Low back pain, unspecified: Secondary | ICD-10-CM | POA: Diagnosis not present

## 2023-07-21 DIAGNOSIS — M545 Low back pain, unspecified: Secondary | ICD-10-CM | POA: Diagnosis not present

## 2023-07-21 DIAGNOSIS — I5032 Chronic diastolic (congestive) heart failure: Secondary | ICD-10-CM | POA: Diagnosis not present

## 2023-07-21 DIAGNOSIS — G8929 Other chronic pain: Secondary | ICD-10-CM | POA: Diagnosis not present

## 2023-07-21 DIAGNOSIS — Z9181 History of falling: Secondary | ICD-10-CM | POA: Diagnosis not present

## 2023-07-21 DIAGNOSIS — S81812D Laceration without foreign body, left lower leg, subsequent encounter: Secondary | ICD-10-CM | POA: Diagnosis not present

## 2023-07-21 DIAGNOSIS — D631 Anemia in chronic kidney disease: Secondary | ICD-10-CM | POA: Diagnosis not present

## 2023-07-21 DIAGNOSIS — S72142D Displaced intertrochanteric fracture of left femur, subsequent encounter for closed fracture with routine healing: Secondary | ICD-10-CM | POA: Diagnosis not present

## 2023-07-21 DIAGNOSIS — N182 Chronic kidney disease, stage 2 (mild): Secondary | ICD-10-CM | POA: Diagnosis not present

## 2023-07-21 DIAGNOSIS — I130 Hypertensive heart and chronic kidney disease with heart failure and stage 1 through stage 4 chronic kidney disease, or unspecified chronic kidney disease: Secondary | ICD-10-CM | POA: Diagnosis not present

## 2023-07-21 DIAGNOSIS — M3500 Sicca syndrome, unspecified: Secondary | ICD-10-CM | POA: Diagnosis not present

## 2023-07-22 DIAGNOSIS — S72142D Displaced intertrochanteric fracture of left femur, subsequent encounter for closed fracture with routine healing: Secondary | ICD-10-CM | POA: Diagnosis not present

## 2023-07-23 DIAGNOSIS — I10 Essential (primary) hypertension: Secondary | ICD-10-CM | POA: Diagnosis not present

## 2023-07-23 DIAGNOSIS — L97921 Non-pressure chronic ulcer of unspecified part of left lower leg limited to breakdown of skin: Secondary | ICD-10-CM | POA: Diagnosis not present

## 2023-07-23 DIAGNOSIS — R5381 Other malaise: Secondary | ICD-10-CM | POA: Diagnosis not present

## 2023-07-23 DIAGNOSIS — N1831 Chronic kidney disease, stage 3a: Secondary | ICD-10-CM | POA: Diagnosis not present

## 2023-07-23 DIAGNOSIS — L97911 Non-pressure chronic ulcer of unspecified part of right lower leg limited to breakdown of skin: Secondary | ICD-10-CM | POA: Diagnosis not present

## 2023-07-23 DIAGNOSIS — R296 Repeated falls: Secondary | ICD-10-CM | POA: Diagnosis not present

## 2023-07-23 DIAGNOSIS — R262 Difficulty in walking, not elsewhere classified: Secondary | ICD-10-CM | POA: Diagnosis not present

## 2023-07-23 DIAGNOSIS — Z8781 Personal history of (healed) traumatic fracture: Secondary | ICD-10-CM | POA: Diagnosis not present

## 2023-07-24 DIAGNOSIS — M3500 Sicca syndrome, unspecified: Secondary | ICD-10-CM | POA: Diagnosis not present

## 2023-07-24 DIAGNOSIS — M545 Low back pain, unspecified: Secondary | ICD-10-CM | POA: Diagnosis not present

## 2023-07-24 DIAGNOSIS — S81812D Laceration without foreign body, left lower leg, subsequent encounter: Secondary | ICD-10-CM | POA: Diagnosis not present

## 2023-07-24 DIAGNOSIS — N182 Chronic kidney disease, stage 2 (mild): Secondary | ICD-10-CM | POA: Diagnosis not present

## 2023-07-24 DIAGNOSIS — S72142D Displaced intertrochanteric fracture of left femur, subsequent encounter for closed fracture with routine healing: Secondary | ICD-10-CM | POA: Diagnosis not present

## 2023-07-24 DIAGNOSIS — G8929 Other chronic pain: Secondary | ICD-10-CM | POA: Diagnosis not present

## 2023-07-24 DIAGNOSIS — D631 Anemia in chronic kidney disease: Secondary | ICD-10-CM | POA: Diagnosis not present

## 2023-07-24 DIAGNOSIS — Z9181 History of falling: Secondary | ICD-10-CM | POA: Diagnosis not present

## 2023-07-24 DIAGNOSIS — I5032 Chronic diastolic (congestive) heart failure: Secondary | ICD-10-CM | POA: Diagnosis not present

## 2023-07-24 DIAGNOSIS — I130 Hypertensive heart and chronic kidney disease with heart failure and stage 1 through stage 4 chronic kidney disease, or unspecified chronic kidney disease: Secondary | ICD-10-CM | POA: Diagnosis not present

## 2023-07-25 DIAGNOSIS — M545 Low back pain, unspecified: Secondary | ICD-10-CM | POA: Diagnosis not present

## 2023-07-25 DIAGNOSIS — E559 Vitamin D deficiency, unspecified: Secondary | ICD-10-CM | POA: Diagnosis not present

## 2023-07-25 DIAGNOSIS — E785 Hyperlipidemia, unspecified: Secondary | ICD-10-CM | POA: Diagnosis not present

## 2023-07-25 DIAGNOSIS — I130 Hypertensive heart and chronic kidney disease with heart failure and stage 1 through stage 4 chronic kidney disease, or unspecified chronic kidney disease: Secondary | ICD-10-CM | POA: Diagnosis not present

## 2023-07-25 DIAGNOSIS — N182 Chronic kidney disease, stage 2 (mild): Secondary | ICD-10-CM | POA: Diagnosis not present

## 2023-07-25 DIAGNOSIS — G8929 Other chronic pain: Secondary | ICD-10-CM | POA: Diagnosis not present

## 2023-07-25 DIAGNOSIS — D631 Anemia in chronic kidney disease: Secondary | ICD-10-CM | POA: Diagnosis not present

## 2023-07-25 DIAGNOSIS — I5032 Chronic diastolic (congestive) heart failure: Secondary | ICD-10-CM | POA: Diagnosis not present

## 2023-07-25 DIAGNOSIS — S81812D Laceration without foreign body, left lower leg, subsequent encounter: Secondary | ICD-10-CM | POA: Diagnosis not present

## 2023-07-25 DIAGNOSIS — S72142D Displaced intertrochanteric fracture of left femur, subsequent encounter for closed fracture with routine healing: Secondary | ICD-10-CM | POA: Diagnosis not present

## 2023-07-25 DIAGNOSIS — Z79899 Other long term (current) drug therapy: Secondary | ICD-10-CM | POA: Diagnosis not present

## 2023-07-25 DIAGNOSIS — M3500 Sicca syndrome, unspecified: Secondary | ICD-10-CM | POA: Diagnosis not present

## 2023-07-25 DIAGNOSIS — Z9181 History of falling: Secondary | ICD-10-CM | POA: Diagnosis not present

## 2023-07-28 DIAGNOSIS — Z9181 History of falling: Secondary | ICD-10-CM | POA: Diagnosis not present

## 2023-07-28 DIAGNOSIS — G8929 Other chronic pain: Secondary | ICD-10-CM | POA: Diagnosis not present

## 2023-07-28 DIAGNOSIS — I5032 Chronic diastolic (congestive) heart failure: Secondary | ICD-10-CM | POA: Diagnosis not present

## 2023-07-28 DIAGNOSIS — S72142D Displaced intertrochanteric fracture of left femur, subsequent encounter for closed fracture with routine healing: Secondary | ICD-10-CM | POA: Diagnosis not present

## 2023-07-28 DIAGNOSIS — N182 Chronic kidney disease, stage 2 (mild): Secondary | ICD-10-CM | POA: Diagnosis not present

## 2023-07-28 DIAGNOSIS — S81812D Laceration without foreign body, left lower leg, subsequent encounter: Secondary | ICD-10-CM | POA: Diagnosis not present

## 2023-07-28 DIAGNOSIS — D631 Anemia in chronic kidney disease: Secondary | ICD-10-CM | POA: Diagnosis not present

## 2023-07-28 DIAGNOSIS — M3500 Sicca syndrome, unspecified: Secondary | ICD-10-CM | POA: Diagnosis not present

## 2023-07-28 DIAGNOSIS — M545 Low back pain, unspecified: Secondary | ICD-10-CM | POA: Diagnosis not present

## 2023-07-28 DIAGNOSIS — I130 Hypertensive heart and chronic kidney disease with heart failure and stage 1 through stage 4 chronic kidney disease, or unspecified chronic kidney disease: Secondary | ICD-10-CM | POA: Diagnosis not present

## 2023-07-30 DIAGNOSIS — N182 Chronic kidney disease, stage 2 (mild): Secondary | ICD-10-CM | POA: Diagnosis not present

## 2023-07-30 DIAGNOSIS — M545 Low back pain, unspecified: Secondary | ICD-10-CM | POA: Diagnosis not present

## 2023-07-30 DIAGNOSIS — S81812D Laceration without foreign body, left lower leg, subsequent encounter: Secondary | ICD-10-CM | POA: Diagnosis not present

## 2023-07-30 DIAGNOSIS — I130 Hypertensive heart and chronic kidney disease with heart failure and stage 1 through stage 4 chronic kidney disease, or unspecified chronic kidney disease: Secondary | ICD-10-CM | POA: Diagnosis not present

## 2023-07-30 DIAGNOSIS — I5032 Chronic diastolic (congestive) heart failure: Secondary | ICD-10-CM | POA: Diagnosis not present

## 2023-07-30 DIAGNOSIS — G8929 Other chronic pain: Secondary | ICD-10-CM | POA: Diagnosis not present

## 2023-07-30 DIAGNOSIS — Z9181 History of falling: Secondary | ICD-10-CM | POA: Diagnosis not present

## 2023-07-30 DIAGNOSIS — D631 Anemia in chronic kidney disease: Secondary | ICD-10-CM | POA: Diagnosis not present

## 2023-07-30 DIAGNOSIS — M3500 Sicca syndrome, unspecified: Secondary | ICD-10-CM | POA: Diagnosis not present

## 2023-07-30 DIAGNOSIS — S72142D Displaced intertrochanteric fracture of left femur, subsequent encounter for closed fracture with routine healing: Secondary | ICD-10-CM | POA: Diagnosis not present

## 2023-08-01 DIAGNOSIS — R2689 Other abnormalities of gait and mobility: Secondary | ICD-10-CM | POA: Diagnosis not present

## 2023-08-01 DIAGNOSIS — R262 Difficulty in walking, not elsewhere classified: Secondary | ICD-10-CM | POA: Diagnosis not present

## 2023-08-01 DIAGNOSIS — G8929 Other chronic pain: Secondary | ICD-10-CM | POA: Diagnosis not present

## 2023-08-01 DIAGNOSIS — S72142D Displaced intertrochanteric fracture of left femur, subsequent encounter for closed fracture with routine healing: Secondary | ICD-10-CM | POA: Diagnosis not present

## 2023-08-01 DIAGNOSIS — M6281 Muscle weakness (generalized): Secondary | ICD-10-CM | POA: Diagnosis not present

## 2023-08-01 DIAGNOSIS — R2681 Unsteadiness on feet: Secondary | ICD-10-CM | POA: Diagnosis not present

## 2023-08-01 DIAGNOSIS — Z9181 History of falling: Secondary | ICD-10-CM | POA: Diagnosis not present

## 2023-08-04 DIAGNOSIS — I130 Hypertensive heart and chronic kidney disease with heart failure and stage 1 through stage 4 chronic kidney disease, or unspecified chronic kidney disease: Secondary | ICD-10-CM | POA: Diagnosis not present

## 2023-08-04 DIAGNOSIS — N182 Chronic kidney disease, stage 2 (mild): Secondary | ICD-10-CM | POA: Diagnosis not present

## 2023-08-04 DIAGNOSIS — D631 Anemia in chronic kidney disease: Secondary | ICD-10-CM | POA: Diagnosis not present

## 2023-08-04 DIAGNOSIS — G8929 Other chronic pain: Secondary | ICD-10-CM | POA: Diagnosis not present

## 2023-08-04 DIAGNOSIS — Z9181 History of falling: Secondary | ICD-10-CM | POA: Diagnosis not present

## 2023-08-04 DIAGNOSIS — I5032 Chronic diastolic (congestive) heart failure: Secondary | ICD-10-CM | POA: Diagnosis not present

## 2023-08-04 DIAGNOSIS — M545 Low back pain, unspecified: Secondary | ICD-10-CM | POA: Diagnosis not present

## 2023-08-04 DIAGNOSIS — S72142D Displaced intertrochanteric fracture of left femur, subsequent encounter for closed fracture with routine healing: Secondary | ICD-10-CM | POA: Diagnosis not present

## 2023-08-04 DIAGNOSIS — S81812D Laceration without foreign body, left lower leg, subsequent encounter: Secondary | ICD-10-CM | POA: Diagnosis not present

## 2023-08-04 DIAGNOSIS — M3500 Sicca syndrome, unspecified: Secondary | ICD-10-CM | POA: Diagnosis not present

## 2023-08-05 DIAGNOSIS — M545 Low back pain, unspecified: Secondary | ICD-10-CM | POA: Diagnosis not present

## 2023-08-05 DIAGNOSIS — I5032 Chronic diastolic (congestive) heart failure: Secondary | ICD-10-CM | POA: Diagnosis not present

## 2023-08-05 DIAGNOSIS — M3500 Sicca syndrome, unspecified: Secondary | ICD-10-CM | POA: Diagnosis not present

## 2023-08-05 DIAGNOSIS — N182 Chronic kidney disease, stage 2 (mild): Secondary | ICD-10-CM | POA: Diagnosis not present

## 2023-08-05 DIAGNOSIS — D631 Anemia in chronic kidney disease: Secondary | ICD-10-CM | POA: Diagnosis not present

## 2023-08-05 DIAGNOSIS — I130 Hypertensive heart and chronic kidney disease with heart failure and stage 1 through stage 4 chronic kidney disease, or unspecified chronic kidney disease: Secondary | ICD-10-CM | POA: Diagnosis not present

## 2023-08-05 DIAGNOSIS — G8929 Other chronic pain: Secondary | ICD-10-CM | POA: Diagnosis not present

## 2023-08-05 DIAGNOSIS — S81812D Laceration without foreign body, left lower leg, subsequent encounter: Secondary | ICD-10-CM | POA: Diagnosis not present

## 2023-08-05 DIAGNOSIS — Z9181 History of falling: Secondary | ICD-10-CM | POA: Diagnosis not present

## 2023-08-05 DIAGNOSIS — S72142D Displaced intertrochanteric fracture of left femur, subsequent encounter for closed fracture with routine healing: Secondary | ICD-10-CM | POA: Diagnosis not present

## 2023-08-06 DIAGNOSIS — S81812D Laceration without foreign body, left lower leg, subsequent encounter: Secondary | ICD-10-CM | POA: Diagnosis not present

## 2023-08-06 DIAGNOSIS — K5909 Other constipation: Secondary | ICD-10-CM | POA: Diagnosis not present

## 2023-08-06 DIAGNOSIS — Z9181 History of falling: Secondary | ICD-10-CM | POA: Diagnosis not present

## 2023-08-06 DIAGNOSIS — S72142D Displaced intertrochanteric fracture of left femur, subsequent encounter for closed fracture with routine healing: Secondary | ICD-10-CM | POA: Diagnosis not present

## 2023-08-06 DIAGNOSIS — G8929 Other chronic pain: Secondary | ICD-10-CM | POA: Diagnosis not present

## 2023-08-06 DIAGNOSIS — M3500 Sicca syndrome, unspecified: Secondary | ICD-10-CM | POA: Diagnosis not present

## 2023-08-06 DIAGNOSIS — I130 Hypertensive heart and chronic kidney disease with heart failure and stage 1 through stage 4 chronic kidney disease, or unspecified chronic kidney disease: Secondary | ICD-10-CM | POA: Diagnosis not present

## 2023-08-06 DIAGNOSIS — N182 Chronic kidney disease, stage 2 (mild): Secondary | ICD-10-CM | POA: Diagnosis not present

## 2023-08-06 DIAGNOSIS — I5032 Chronic diastolic (congestive) heart failure: Secondary | ICD-10-CM | POA: Diagnosis not present

## 2023-08-06 DIAGNOSIS — M545 Low back pain, unspecified: Secondary | ICD-10-CM | POA: Diagnosis not present

## 2023-08-06 DIAGNOSIS — D631 Anemia in chronic kidney disease: Secondary | ICD-10-CM | POA: Diagnosis not present

## 2023-08-06 DIAGNOSIS — I10 Essential (primary) hypertension: Secondary | ICD-10-CM | POA: Diagnosis not present

## 2023-08-07 DIAGNOSIS — D631 Anemia in chronic kidney disease: Secondary | ICD-10-CM | POA: Diagnosis not present

## 2023-08-07 DIAGNOSIS — N182 Chronic kidney disease, stage 2 (mild): Secondary | ICD-10-CM | POA: Diagnosis not present

## 2023-08-07 DIAGNOSIS — I5032 Chronic diastolic (congestive) heart failure: Secondary | ICD-10-CM | POA: Diagnosis not present

## 2023-08-07 DIAGNOSIS — Z9181 History of falling: Secondary | ICD-10-CM | POA: Diagnosis not present

## 2023-08-07 DIAGNOSIS — S72142D Displaced intertrochanteric fracture of left femur, subsequent encounter for closed fracture with routine healing: Secondary | ICD-10-CM | POA: Diagnosis not present

## 2023-08-07 DIAGNOSIS — M545 Low back pain, unspecified: Secondary | ICD-10-CM | POA: Diagnosis not present

## 2023-08-07 DIAGNOSIS — I130 Hypertensive heart and chronic kidney disease with heart failure and stage 1 through stage 4 chronic kidney disease, or unspecified chronic kidney disease: Secondary | ICD-10-CM | POA: Diagnosis not present

## 2023-08-07 DIAGNOSIS — M3500 Sicca syndrome, unspecified: Secondary | ICD-10-CM | POA: Diagnosis not present

## 2023-08-07 DIAGNOSIS — S81812D Laceration without foreign body, left lower leg, subsequent encounter: Secondary | ICD-10-CM | POA: Diagnosis not present

## 2023-08-07 DIAGNOSIS — G8929 Other chronic pain: Secondary | ICD-10-CM | POA: Diagnosis not present

## 2023-08-11 DIAGNOSIS — N182 Chronic kidney disease, stage 2 (mild): Secondary | ICD-10-CM | POA: Diagnosis not present

## 2023-08-11 DIAGNOSIS — I130 Hypertensive heart and chronic kidney disease with heart failure and stage 1 through stage 4 chronic kidney disease, or unspecified chronic kidney disease: Secondary | ICD-10-CM | POA: Diagnosis not present

## 2023-08-11 DIAGNOSIS — D631 Anemia in chronic kidney disease: Secondary | ICD-10-CM | POA: Diagnosis not present

## 2023-08-11 DIAGNOSIS — Z9181 History of falling: Secondary | ICD-10-CM | POA: Diagnosis not present

## 2023-08-11 DIAGNOSIS — I5032 Chronic diastolic (congestive) heart failure: Secondary | ICD-10-CM | POA: Diagnosis not present

## 2023-08-11 DIAGNOSIS — G8929 Other chronic pain: Secondary | ICD-10-CM | POA: Diagnosis not present

## 2023-08-11 DIAGNOSIS — S81812D Laceration without foreign body, left lower leg, subsequent encounter: Secondary | ICD-10-CM | POA: Diagnosis not present

## 2023-08-11 DIAGNOSIS — M3500 Sicca syndrome, unspecified: Secondary | ICD-10-CM | POA: Diagnosis not present

## 2023-08-11 DIAGNOSIS — M545 Low back pain, unspecified: Secondary | ICD-10-CM | POA: Diagnosis not present

## 2023-08-11 DIAGNOSIS — S72142D Displaced intertrochanteric fracture of left femur, subsequent encounter for closed fracture with routine healing: Secondary | ICD-10-CM | POA: Diagnosis not present

## 2023-08-12 DIAGNOSIS — D631 Anemia in chronic kidney disease: Secondary | ICD-10-CM | POA: Diagnosis not present

## 2023-08-12 DIAGNOSIS — M545 Low back pain, unspecified: Secondary | ICD-10-CM | POA: Diagnosis not present

## 2023-08-12 DIAGNOSIS — I130 Hypertensive heart and chronic kidney disease with heart failure and stage 1 through stage 4 chronic kidney disease, or unspecified chronic kidney disease: Secondary | ICD-10-CM | POA: Diagnosis not present

## 2023-08-12 DIAGNOSIS — S81812D Laceration without foreign body, left lower leg, subsequent encounter: Secondary | ICD-10-CM | POA: Diagnosis not present

## 2023-08-12 DIAGNOSIS — S72142D Displaced intertrochanteric fracture of left femur, subsequent encounter for closed fracture with routine healing: Secondary | ICD-10-CM | POA: Diagnosis not present

## 2023-08-12 DIAGNOSIS — Z9181 History of falling: Secondary | ICD-10-CM | POA: Diagnosis not present

## 2023-08-12 DIAGNOSIS — M3500 Sicca syndrome, unspecified: Secondary | ICD-10-CM | POA: Diagnosis not present

## 2023-08-12 DIAGNOSIS — G8929 Other chronic pain: Secondary | ICD-10-CM | POA: Diagnosis not present

## 2023-08-12 DIAGNOSIS — I5032 Chronic diastolic (congestive) heart failure: Secondary | ICD-10-CM | POA: Diagnosis not present

## 2023-08-12 DIAGNOSIS — N182 Chronic kidney disease, stage 2 (mild): Secondary | ICD-10-CM | POA: Diagnosis not present

## 2023-08-14 DIAGNOSIS — G8929 Other chronic pain: Secondary | ICD-10-CM | POA: Diagnosis not present

## 2023-08-14 DIAGNOSIS — I130 Hypertensive heart and chronic kidney disease with heart failure and stage 1 through stage 4 chronic kidney disease, or unspecified chronic kidney disease: Secondary | ICD-10-CM | POA: Diagnosis not present

## 2023-08-14 DIAGNOSIS — I5032 Chronic diastolic (congestive) heart failure: Secondary | ICD-10-CM | POA: Diagnosis not present

## 2023-08-14 DIAGNOSIS — N182 Chronic kidney disease, stage 2 (mild): Secondary | ICD-10-CM | POA: Diagnosis not present

## 2023-08-14 DIAGNOSIS — S81812D Laceration without foreign body, left lower leg, subsequent encounter: Secondary | ICD-10-CM | POA: Diagnosis not present

## 2023-08-14 DIAGNOSIS — M3500 Sicca syndrome, unspecified: Secondary | ICD-10-CM | POA: Diagnosis not present

## 2023-08-14 DIAGNOSIS — S72142D Displaced intertrochanteric fracture of left femur, subsequent encounter for closed fracture with routine healing: Secondary | ICD-10-CM | POA: Diagnosis not present

## 2023-08-14 DIAGNOSIS — Z9181 History of falling: Secondary | ICD-10-CM | POA: Diagnosis not present

## 2023-08-14 DIAGNOSIS — M545 Low back pain, unspecified: Secondary | ICD-10-CM | POA: Diagnosis not present

## 2023-08-14 DIAGNOSIS — D631 Anemia in chronic kidney disease: Secondary | ICD-10-CM | POA: Diagnosis not present

## 2023-08-15 DIAGNOSIS — I129 Hypertensive chronic kidney disease with stage 1 through stage 4 chronic kidney disease, or unspecified chronic kidney disease: Secondary | ICD-10-CM | POA: Diagnosis not present

## 2023-08-15 DIAGNOSIS — K219 Gastro-esophageal reflux disease without esophagitis: Secondary | ICD-10-CM | POA: Diagnosis not present

## 2023-08-15 DIAGNOSIS — N182 Chronic kidney disease, stage 2 (mild): Secondary | ICD-10-CM | POA: Diagnosis not present

## 2023-08-15 DIAGNOSIS — I10 Essential (primary) hypertension: Secondary | ICD-10-CM | POA: Diagnosis not present

## 2023-08-18 DIAGNOSIS — M545 Low back pain, unspecified: Secondary | ICD-10-CM | POA: Diagnosis not present

## 2023-08-18 DIAGNOSIS — D631 Anemia in chronic kidney disease: Secondary | ICD-10-CM | POA: Diagnosis not present

## 2023-08-18 DIAGNOSIS — N182 Chronic kidney disease, stage 2 (mild): Secondary | ICD-10-CM | POA: Diagnosis not present

## 2023-08-18 DIAGNOSIS — S81812D Laceration without foreign body, left lower leg, subsequent encounter: Secondary | ICD-10-CM | POA: Diagnosis not present

## 2023-08-18 DIAGNOSIS — I5032 Chronic diastolic (congestive) heart failure: Secondary | ICD-10-CM | POA: Diagnosis not present

## 2023-08-18 DIAGNOSIS — Z9181 History of falling: Secondary | ICD-10-CM | POA: Diagnosis not present

## 2023-08-18 DIAGNOSIS — M3500 Sicca syndrome, unspecified: Secondary | ICD-10-CM | POA: Diagnosis not present

## 2023-08-18 DIAGNOSIS — G8929 Other chronic pain: Secondary | ICD-10-CM | POA: Diagnosis not present

## 2023-08-18 DIAGNOSIS — I130 Hypertensive heart and chronic kidney disease with heart failure and stage 1 through stage 4 chronic kidney disease, or unspecified chronic kidney disease: Secondary | ICD-10-CM | POA: Diagnosis not present

## 2023-08-18 DIAGNOSIS — S72142D Displaced intertrochanteric fracture of left femur, subsequent encounter for closed fracture with routine healing: Secondary | ICD-10-CM | POA: Diagnosis not present

## 2023-08-20 DIAGNOSIS — N182 Chronic kidney disease, stage 2 (mild): Secondary | ICD-10-CM | POA: Diagnosis not present

## 2023-08-20 DIAGNOSIS — D631 Anemia in chronic kidney disease: Secondary | ICD-10-CM | POA: Diagnosis not present

## 2023-08-20 DIAGNOSIS — Z9181 History of falling: Secondary | ICD-10-CM | POA: Diagnosis not present

## 2023-08-20 DIAGNOSIS — S72142D Displaced intertrochanteric fracture of left femur, subsequent encounter for closed fracture with routine healing: Secondary | ICD-10-CM | POA: Diagnosis not present

## 2023-08-20 DIAGNOSIS — M3500 Sicca syndrome, unspecified: Secondary | ICD-10-CM | POA: Diagnosis not present

## 2023-08-20 DIAGNOSIS — G8929 Other chronic pain: Secondary | ICD-10-CM | POA: Diagnosis not present

## 2023-08-20 DIAGNOSIS — M545 Low back pain, unspecified: Secondary | ICD-10-CM | POA: Diagnosis not present

## 2023-08-20 DIAGNOSIS — S81812D Laceration without foreign body, left lower leg, subsequent encounter: Secondary | ICD-10-CM | POA: Diagnosis not present

## 2023-08-20 DIAGNOSIS — I5032 Chronic diastolic (congestive) heart failure: Secondary | ICD-10-CM | POA: Diagnosis not present

## 2023-08-20 DIAGNOSIS — I130 Hypertensive heart and chronic kidney disease with heart failure and stage 1 through stage 4 chronic kidney disease, or unspecified chronic kidney disease: Secondary | ICD-10-CM | POA: Diagnosis not present

## 2023-08-21 DIAGNOSIS — S81812D Laceration without foreign body, left lower leg, subsequent encounter: Secondary | ICD-10-CM | POA: Diagnosis not present

## 2023-08-21 DIAGNOSIS — M545 Low back pain, unspecified: Secondary | ICD-10-CM | POA: Diagnosis not present

## 2023-08-21 DIAGNOSIS — S72142D Displaced intertrochanteric fracture of left femur, subsequent encounter for closed fracture with routine healing: Secondary | ICD-10-CM | POA: Diagnosis not present

## 2023-08-21 DIAGNOSIS — I5032 Chronic diastolic (congestive) heart failure: Secondary | ICD-10-CM | POA: Diagnosis not present

## 2023-08-21 DIAGNOSIS — M3500 Sicca syndrome, unspecified: Secondary | ICD-10-CM | POA: Diagnosis not present

## 2023-08-21 DIAGNOSIS — Z9181 History of falling: Secondary | ICD-10-CM | POA: Diagnosis not present

## 2023-08-21 DIAGNOSIS — G8929 Other chronic pain: Secondary | ICD-10-CM | POA: Diagnosis not present

## 2023-08-21 DIAGNOSIS — N182 Chronic kidney disease, stage 2 (mild): Secondary | ICD-10-CM | POA: Diagnosis not present

## 2023-08-21 DIAGNOSIS — I130 Hypertensive heart and chronic kidney disease with heart failure and stage 1 through stage 4 chronic kidney disease, or unspecified chronic kidney disease: Secondary | ICD-10-CM | POA: Diagnosis not present

## 2023-08-21 DIAGNOSIS — D631 Anemia in chronic kidney disease: Secondary | ICD-10-CM | POA: Diagnosis not present

## 2023-08-22 DIAGNOSIS — S81812D Laceration without foreign body, left lower leg, subsequent encounter: Secondary | ICD-10-CM | POA: Diagnosis not present

## 2023-08-22 DIAGNOSIS — Z9181 History of falling: Secondary | ICD-10-CM | POA: Diagnosis not present

## 2023-08-22 DIAGNOSIS — I130 Hypertensive heart and chronic kidney disease with heart failure and stage 1 through stage 4 chronic kidney disease, or unspecified chronic kidney disease: Secondary | ICD-10-CM | POA: Diagnosis not present

## 2023-08-22 DIAGNOSIS — G8929 Other chronic pain: Secondary | ICD-10-CM | POA: Diagnosis not present

## 2023-08-22 DIAGNOSIS — N182 Chronic kidney disease, stage 2 (mild): Secondary | ICD-10-CM | POA: Diagnosis not present

## 2023-08-22 DIAGNOSIS — S72142D Displaced intertrochanteric fracture of left femur, subsequent encounter for closed fracture with routine healing: Secondary | ICD-10-CM | POA: Diagnosis not present

## 2023-08-22 DIAGNOSIS — M545 Low back pain, unspecified: Secondary | ICD-10-CM | POA: Diagnosis not present

## 2023-08-22 DIAGNOSIS — M3500 Sicca syndrome, unspecified: Secondary | ICD-10-CM | POA: Diagnosis not present

## 2023-08-22 DIAGNOSIS — D631 Anemia in chronic kidney disease: Secondary | ICD-10-CM | POA: Diagnosis not present

## 2023-08-22 DIAGNOSIS — I5032 Chronic diastolic (congestive) heart failure: Secondary | ICD-10-CM | POA: Diagnosis not present

## 2023-08-25 DIAGNOSIS — S81812D Laceration without foreign body, left lower leg, subsequent encounter: Secondary | ICD-10-CM | POA: Diagnosis not present

## 2023-08-25 DIAGNOSIS — N182 Chronic kidney disease, stage 2 (mild): Secondary | ICD-10-CM | POA: Diagnosis not present

## 2023-08-25 DIAGNOSIS — G8929 Other chronic pain: Secondary | ICD-10-CM | POA: Diagnosis not present

## 2023-08-25 DIAGNOSIS — I5032 Chronic diastolic (congestive) heart failure: Secondary | ICD-10-CM | POA: Diagnosis not present

## 2023-08-25 DIAGNOSIS — I130 Hypertensive heart and chronic kidney disease with heart failure and stage 1 through stage 4 chronic kidney disease, or unspecified chronic kidney disease: Secondary | ICD-10-CM | POA: Diagnosis not present

## 2023-08-25 DIAGNOSIS — M3500 Sicca syndrome, unspecified: Secondary | ICD-10-CM | POA: Diagnosis not present

## 2023-08-25 DIAGNOSIS — S72142D Displaced intertrochanteric fracture of left femur, subsequent encounter for closed fracture with routine healing: Secondary | ICD-10-CM | POA: Diagnosis not present

## 2023-08-25 DIAGNOSIS — D631 Anemia in chronic kidney disease: Secondary | ICD-10-CM | POA: Diagnosis not present

## 2023-08-25 DIAGNOSIS — Z9181 History of falling: Secondary | ICD-10-CM | POA: Diagnosis not present

## 2023-08-25 DIAGNOSIS — M545 Low back pain, unspecified: Secondary | ICD-10-CM | POA: Diagnosis not present

## 2023-08-26 DIAGNOSIS — N182 Chronic kidney disease, stage 2 (mild): Secondary | ICD-10-CM | POA: Diagnosis not present

## 2023-08-26 DIAGNOSIS — S72142D Displaced intertrochanteric fracture of left femur, subsequent encounter for closed fracture with routine healing: Secondary | ICD-10-CM | POA: Diagnosis not present

## 2023-08-26 DIAGNOSIS — D631 Anemia in chronic kidney disease: Secondary | ICD-10-CM | POA: Diagnosis not present

## 2023-08-26 DIAGNOSIS — M3500 Sicca syndrome, unspecified: Secondary | ICD-10-CM | POA: Diagnosis not present

## 2023-08-26 DIAGNOSIS — G8929 Other chronic pain: Secondary | ICD-10-CM | POA: Diagnosis not present

## 2023-08-26 DIAGNOSIS — I5032 Chronic diastolic (congestive) heart failure: Secondary | ICD-10-CM | POA: Diagnosis not present

## 2023-08-26 DIAGNOSIS — M545 Low back pain, unspecified: Secondary | ICD-10-CM | POA: Diagnosis not present

## 2023-08-26 DIAGNOSIS — I130 Hypertensive heart and chronic kidney disease with heart failure and stage 1 through stage 4 chronic kidney disease, or unspecified chronic kidney disease: Secondary | ICD-10-CM | POA: Diagnosis not present

## 2023-08-26 DIAGNOSIS — S81812D Laceration without foreign body, left lower leg, subsequent encounter: Secondary | ICD-10-CM | POA: Diagnosis not present

## 2023-08-26 DIAGNOSIS — Z9181 History of falling: Secondary | ICD-10-CM | POA: Diagnosis not present

## 2023-08-27 DIAGNOSIS — N182 Chronic kidney disease, stage 2 (mild): Secondary | ICD-10-CM | POA: Diagnosis not present

## 2023-08-27 DIAGNOSIS — S81812D Laceration without foreign body, left lower leg, subsequent encounter: Secondary | ICD-10-CM | POA: Diagnosis not present

## 2023-08-27 DIAGNOSIS — M3500 Sicca syndrome, unspecified: Secondary | ICD-10-CM | POA: Diagnosis not present

## 2023-08-27 DIAGNOSIS — G8929 Other chronic pain: Secondary | ICD-10-CM | POA: Diagnosis not present

## 2023-08-27 DIAGNOSIS — M545 Low back pain, unspecified: Secondary | ICD-10-CM | POA: Diagnosis not present

## 2023-08-27 DIAGNOSIS — I5032 Chronic diastolic (congestive) heart failure: Secondary | ICD-10-CM | POA: Diagnosis not present

## 2023-08-27 DIAGNOSIS — D631 Anemia in chronic kidney disease: Secondary | ICD-10-CM | POA: Diagnosis not present

## 2023-08-27 DIAGNOSIS — Z9181 History of falling: Secondary | ICD-10-CM | POA: Diagnosis not present

## 2023-08-27 DIAGNOSIS — I130 Hypertensive heart and chronic kidney disease with heart failure and stage 1 through stage 4 chronic kidney disease, or unspecified chronic kidney disease: Secondary | ICD-10-CM | POA: Diagnosis not present

## 2023-08-27 DIAGNOSIS — S72142D Displaced intertrochanteric fracture of left femur, subsequent encounter for closed fracture with routine healing: Secondary | ICD-10-CM | POA: Diagnosis not present

## 2023-08-28 DIAGNOSIS — M3500 Sicca syndrome, unspecified: Secondary | ICD-10-CM | POA: Diagnosis not present

## 2023-08-28 DIAGNOSIS — S81812D Laceration without foreign body, left lower leg, subsequent encounter: Secondary | ICD-10-CM | POA: Diagnosis not present

## 2023-08-28 DIAGNOSIS — I130 Hypertensive heart and chronic kidney disease with heart failure and stage 1 through stage 4 chronic kidney disease, or unspecified chronic kidney disease: Secondary | ICD-10-CM | POA: Diagnosis not present

## 2023-08-28 DIAGNOSIS — D631 Anemia in chronic kidney disease: Secondary | ICD-10-CM | POA: Diagnosis not present

## 2023-08-28 DIAGNOSIS — S72142D Displaced intertrochanteric fracture of left femur, subsequent encounter for closed fracture with routine healing: Secondary | ICD-10-CM | POA: Diagnosis not present

## 2023-08-28 DIAGNOSIS — Z9181 History of falling: Secondary | ICD-10-CM | POA: Diagnosis not present

## 2023-08-28 DIAGNOSIS — G8929 Other chronic pain: Secondary | ICD-10-CM | POA: Diagnosis not present

## 2023-08-28 DIAGNOSIS — N182 Chronic kidney disease, stage 2 (mild): Secondary | ICD-10-CM | POA: Diagnosis not present

## 2023-08-28 DIAGNOSIS — M545 Low back pain, unspecified: Secondary | ICD-10-CM | POA: Diagnosis not present

## 2023-08-28 DIAGNOSIS — I5032 Chronic diastolic (congestive) heart failure: Secondary | ICD-10-CM | POA: Diagnosis not present

## 2023-08-29 DIAGNOSIS — I130 Hypertensive heart and chronic kidney disease with heart failure and stage 1 through stage 4 chronic kidney disease, or unspecified chronic kidney disease: Secondary | ICD-10-CM | POA: Diagnosis not present

## 2023-08-29 DIAGNOSIS — S72142D Displaced intertrochanteric fracture of left femur, subsequent encounter for closed fracture with routine healing: Secondary | ICD-10-CM | POA: Diagnosis not present

## 2023-08-29 DIAGNOSIS — M3500 Sicca syndrome, unspecified: Secondary | ICD-10-CM | POA: Diagnosis not present

## 2023-08-29 DIAGNOSIS — Z9181 History of falling: Secondary | ICD-10-CM | POA: Diagnosis not present

## 2023-08-29 DIAGNOSIS — I5032 Chronic diastolic (congestive) heart failure: Secondary | ICD-10-CM | POA: Diagnosis not present

## 2023-08-29 DIAGNOSIS — M545 Low back pain, unspecified: Secondary | ICD-10-CM | POA: Diagnosis not present

## 2023-08-29 DIAGNOSIS — N182 Chronic kidney disease, stage 2 (mild): Secondary | ICD-10-CM | POA: Diagnosis not present

## 2023-08-29 DIAGNOSIS — S81812D Laceration without foreign body, left lower leg, subsequent encounter: Secondary | ICD-10-CM | POA: Diagnosis not present

## 2023-08-29 DIAGNOSIS — G8929 Other chronic pain: Secondary | ICD-10-CM | POA: Diagnosis not present

## 2023-08-29 DIAGNOSIS — D631 Anemia in chronic kidney disease: Secondary | ICD-10-CM | POA: Diagnosis not present

## 2023-09-01 DIAGNOSIS — S81812D Laceration without foreign body, left lower leg, subsequent encounter: Secondary | ICD-10-CM | POA: Diagnosis not present

## 2023-09-01 DIAGNOSIS — I5032 Chronic diastolic (congestive) heart failure: Secondary | ICD-10-CM | POA: Diagnosis not present

## 2023-09-01 DIAGNOSIS — Z9181 History of falling: Secondary | ICD-10-CM | POA: Diagnosis not present

## 2023-09-01 DIAGNOSIS — R2681 Unsteadiness on feet: Secondary | ICD-10-CM | POA: Diagnosis not present

## 2023-09-01 DIAGNOSIS — I130 Hypertensive heart and chronic kidney disease with heart failure and stage 1 through stage 4 chronic kidney disease, or unspecified chronic kidney disease: Secondary | ICD-10-CM | POA: Diagnosis not present

## 2023-09-01 DIAGNOSIS — M6281 Muscle weakness (generalized): Secondary | ICD-10-CM | POA: Diagnosis not present

## 2023-09-01 DIAGNOSIS — N182 Chronic kidney disease, stage 2 (mild): Secondary | ICD-10-CM | POA: Diagnosis not present

## 2023-09-01 DIAGNOSIS — R262 Difficulty in walking, not elsewhere classified: Secondary | ICD-10-CM | POA: Diagnosis not present

## 2023-09-01 DIAGNOSIS — M3500 Sicca syndrome, unspecified: Secondary | ICD-10-CM | POA: Diagnosis not present

## 2023-09-01 DIAGNOSIS — D631 Anemia in chronic kidney disease: Secondary | ICD-10-CM | POA: Diagnosis not present

## 2023-09-01 DIAGNOSIS — M545 Low back pain, unspecified: Secondary | ICD-10-CM | POA: Diagnosis not present

## 2023-09-01 DIAGNOSIS — R2689 Other abnormalities of gait and mobility: Secondary | ICD-10-CM | POA: Diagnosis not present

## 2023-09-01 DIAGNOSIS — G8929 Other chronic pain: Secondary | ICD-10-CM | POA: Diagnosis not present

## 2023-09-01 DIAGNOSIS — S72142D Displaced intertrochanteric fracture of left femur, subsequent encounter for closed fracture with routine healing: Secondary | ICD-10-CM | POA: Diagnosis not present

## 2023-09-02 DIAGNOSIS — G8929 Other chronic pain: Secondary | ICD-10-CM | POA: Diagnosis not present

## 2023-09-02 DIAGNOSIS — I130 Hypertensive heart and chronic kidney disease with heart failure and stage 1 through stage 4 chronic kidney disease, or unspecified chronic kidney disease: Secondary | ICD-10-CM | POA: Diagnosis not present

## 2023-09-02 DIAGNOSIS — M545 Low back pain, unspecified: Secondary | ICD-10-CM | POA: Diagnosis not present

## 2023-09-02 DIAGNOSIS — N182 Chronic kidney disease, stage 2 (mild): Secondary | ICD-10-CM | POA: Diagnosis not present

## 2023-09-02 DIAGNOSIS — D631 Anemia in chronic kidney disease: Secondary | ICD-10-CM | POA: Diagnosis not present

## 2023-09-02 DIAGNOSIS — I5032 Chronic diastolic (congestive) heart failure: Secondary | ICD-10-CM | POA: Diagnosis not present

## 2023-09-02 DIAGNOSIS — S72142D Displaced intertrochanteric fracture of left femur, subsequent encounter for closed fracture with routine healing: Secondary | ICD-10-CM | POA: Diagnosis not present

## 2023-09-02 DIAGNOSIS — M3500 Sicca syndrome, unspecified: Secondary | ICD-10-CM | POA: Diagnosis not present

## 2023-09-02 DIAGNOSIS — Z9181 History of falling: Secondary | ICD-10-CM | POA: Diagnosis not present

## 2023-09-02 DIAGNOSIS — S81812D Laceration without foreign body, left lower leg, subsequent encounter: Secondary | ICD-10-CM | POA: Diagnosis not present

## 2023-09-11 DIAGNOSIS — M3500 Sicca syndrome, unspecified: Secondary | ICD-10-CM | POA: Diagnosis not present

## 2023-09-11 DIAGNOSIS — R54 Age-related physical debility: Secondary | ICD-10-CM | POA: Diagnosis not present

## 2023-09-12 DIAGNOSIS — Z0000 Encounter for general adult medical examination without abnormal findings: Secondary | ICD-10-CM | POA: Diagnosis not present

## 2023-09-12 DIAGNOSIS — K5900 Constipation, unspecified: Secondary | ICD-10-CM | POA: Diagnosis not present

## 2023-09-12 DIAGNOSIS — F419 Anxiety disorder, unspecified: Secondary | ICD-10-CM | POA: Diagnosis not present

## 2023-09-12 DIAGNOSIS — R600 Localized edema: Secondary | ICD-10-CM | POA: Diagnosis not present

## 2023-09-12 DIAGNOSIS — M3500 Sicca syndrome, unspecified: Secondary | ICD-10-CM | POA: Diagnosis not present

## 2023-09-19 DIAGNOSIS — M6284 Sarcopenia: Secondary | ICD-10-CM | POA: Diagnosis not present

## 2023-09-19 DIAGNOSIS — R54 Age-related physical debility: Secondary | ICD-10-CM | POA: Diagnosis not present

## 2023-09-19 DIAGNOSIS — M25561 Pain in right knee: Secondary | ICD-10-CM | POA: Diagnosis not present

## 2023-09-19 DIAGNOSIS — R600 Localized edema: Secondary | ICD-10-CM | POA: Diagnosis not present

## 2023-09-19 DIAGNOSIS — M3500 Sicca syndrome, unspecified: Secondary | ICD-10-CM | POA: Diagnosis not present

## 2023-09-23 DIAGNOSIS — M6284 Sarcopenia: Secondary | ICD-10-CM | POA: Diagnosis not present

## 2023-09-23 DIAGNOSIS — M25561 Pain in right knee: Secondary | ICD-10-CM | POA: Diagnosis not present

## 2023-09-23 DIAGNOSIS — M25562 Pain in left knee: Secondary | ICD-10-CM | POA: Diagnosis not present

## 2023-09-23 DIAGNOSIS — Z9181 History of falling: Secondary | ICD-10-CM | POA: Diagnosis not present

## 2023-09-23 DIAGNOSIS — R600 Localized edema: Secondary | ICD-10-CM | POA: Diagnosis not present

## 2023-09-23 DIAGNOSIS — M3500 Sicca syndrome, unspecified: Secondary | ICD-10-CM | POA: Diagnosis not present

## 2023-09-24 DIAGNOSIS — M6284 Sarcopenia: Secondary | ICD-10-CM | POA: Diagnosis not present

## 2023-09-24 DIAGNOSIS — R600 Localized edema: Secondary | ICD-10-CM | POA: Diagnosis not present

## 2023-09-24 DIAGNOSIS — M25562 Pain in left knee: Secondary | ICD-10-CM | POA: Diagnosis not present

## 2023-09-24 DIAGNOSIS — M25561 Pain in right knee: Secondary | ICD-10-CM | POA: Diagnosis not present

## 2023-09-24 DIAGNOSIS — Z9181 History of falling: Secondary | ICD-10-CM | POA: Diagnosis not present

## 2023-09-24 DIAGNOSIS — M3500 Sicca syndrome, unspecified: Secondary | ICD-10-CM | POA: Diagnosis not present

## 2023-09-29 DIAGNOSIS — M25562 Pain in left knee: Secondary | ICD-10-CM | POA: Diagnosis not present

## 2023-09-29 DIAGNOSIS — M25561 Pain in right knee: Secondary | ICD-10-CM | POA: Diagnosis not present

## 2023-09-29 DIAGNOSIS — Z9181 History of falling: Secondary | ICD-10-CM | POA: Diagnosis not present

## 2023-09-29 DIAGNOSIS — R600 Localized edema: Secondary | ICD-10-CM | POA: Diagnosis not present

## 2023-09-29 DIAGNOSIS — M3500 Sicca syndrome, unspecified: Secondary | ICD-10-CM | POA: Diagnosis not present

## 2023-09-29 DIAGNOSIS — M6284 Sarcopenia: Secondary | ICD-10-CM | POA: Diagnosis not present

## 2023-09-30 DIAGNOSIS — M25562 Pain in left knee: Secondary | ICD-10-CM | POA: Diagnosis not present

## 2023-09-30 DIAGNOSIS — M6284 Sarcopenia: Secondary | ICD-10-CM | POA: Diagnosis not present

## 2023-09-30 DIAGNOSIS — R600 Localized edema: Secondary | ICD-10-CM | POA: Diagnosis not present

## 2023-09-30 DIAGNOSIS — M3500 Sicca syndrome, unspecified: Secondary | ICD-10-CM | POA: Diagnosis not present

## 2023-09-30 DIAGNOSIS — M25561 Pain in right knee: Secondary | ICD-10-CM | POA: Diagnosis not present

## 2023-09-30 DIAGNOSIS — Z9181 History of falling: Secondary | ICD-10-CM | POA: Diagnosis not present

## 2023-10-01 DIAGNOSIS — H353131 Nonexudative age-related macular degeneration, bilateral, early dry stage: Secondary | ICD-10-CM | POA: Diagnosis not present

## 2023-10-01 DIAGNOSIS — R2689 Other abnormalities of gait and mobility: Secondary | ICD-10-CM | POA: Diagnosis not present

## 2023-10-01 DIAGNOSIS — S72142D Displaced intertrochanteric fracture of left femur, subsequent encounter for closed fracture with routine healing: Secondary | ICD-10-CM | POA: Diagnosis not present

## 2023-10-01 DIAGNOSIS — M6281 Muscle weakness (generalized): Secondary | ICD-10-CM | POA: Diagnosis not present

## 2023-10-01 DIAGNOSIS — H5203 Hypermetropia, bilateral: Secondary | ICD-10-CM | POA: Diagnosis not present

## 2023-10-01 DIAGNOSIS — R2681 Unsteadiness on feet: Secondary | ICD-10-CM | POA: Diagnosis not present

## 2023-10-01 DIAGNOSIS — Z961 Presence of intraocular lens: Secondary | ICD-10-CM | POA: Diagnosis not present

## 2023-10-01 DIAGNOSIS — G8929 Other chronic pain: Secondary | ICD-10-CM | POA: Diagnosis not present

## 2023-10-01 DIAGNOSIS — Z9181 History of falling: Secondary | ICD-10-CM | POA: Diagnosis not present

## 2023-10-01 DIAGNOSIS — R262 Difficulty in walking, not elsewhere classified: Secondary | ICD-10-CM | POA: Diagnosis not present

## 2023-10-03 DIAGNOSIS — I10 Essential (primary) hypertension: Secondary | ICD-10-CM | POA: Diagnosis not present

## 2023-10-03 DIAGNOSIS — M25562 Pain in left knee: Secondary | ICD-10-CM | POA: Diagnosis not present

## 2023-10-03 DIAGNOSIS — I129 Hypertensive chronic kidney disease with stage 1 through stage 4 chronic kidney disease, or unspecified chronic kidney disease: Secondary | ICD-10-CM | POA: Diagnosis not present

## 2023-10-03 DIAGNOSIS — M25561 Pain in right knee: Secondary | ICD-10-CM | POA: Diagnosis not present

## 2023-10-03 DIAGNOSIS — Z9181 History of falling: Secondary | ICD-10-CM | POA: Diagnosis not present

## 2023-10-03 DIAGNOSIS — K219 Gastro-esophageal reflux disease without esophagitis: Secondary | ICD-10-CM | POA: Diagnosis not present

## 2023-10-03 DIAGNOSIS — M3500 Sicca syndrome, unspecified: Secondary | ICD-10-CM | POA: Diagnosis not present

## 2023-10-03 DIAGNOSIS — R600 Localized edema: Secondary | ICD-10-CM | POA: Diagnosis not present

## 2023-10-03 DIAGNOSIS — M6284 Sarcopenia: Secondary | ICD-10-CM | POA: Diagnosis not present

## 2023-10-03 DIAGNOSIS — N182 Chronic kidney disease, stage 2 (mild): Secondary | ICD-10-CM | POA: Diagnosis not present

## 2023-10-06 DIAGNOSIS — M6284 Sarcopenia: Secondary | ICD-10-CM | POA: Diagnosis not present

## 2023-10-06 DIAGNOSIS — R600 Localized edema: Secondary | ICD-10-CM | POA: Diagnosis not present

## 2023-10-06 DIAGNOSIS — M25562 Pain in left knee: Secondary | ICD-10-CM | POA: Diagnosis not present

## 2023-10-06 DIAGNOSIS — Z9181 History of falling: Secondary | ICD-10-CM | POA: Diagnosis not present

## 2023-10-06 DIAGNOSIS — M3500 Sicca syndrome, unspecified: Secondary | ICD-10-CM | POA: Diagnosis not present

## 2023-10-06 DIAGNOSIS — M25561 Pain in right knee: Secondary | ICD-10-CM | POA: Diagnosis not present

## 2023-10-07 DIAGNOSIS — M3500 Sicca syndrome, unspecified: Secondary | ICD-10-CM | POA: Diagnosis not present

## 2023-10-07 DIAGNOSIS — R54 Age-related physical debility: Secondary | ICD-10-CM | POA: Diagnosis not present

## 2023-10-08 DIAGNOSIS — M3500 Sicca syndrome, unspecified: Secondary | ICD-10-CM | POA: Diagnosis not present

## 2023-10-08 DIAGNOSIS — M25561 Pain in right knee: Secondary | ICD-10-CM | POA: Diagnosis not present

## 2023-10-08 DIAGNOSIS — M6284 Sarcopenia: Secondary | ICD-10-CM | POA: Diagnosis not present

## 2023-10-08 DIAGNOSIS — M25562 Pain in left knee: Secondary | ICD-10-CM | POA: Diagnosis not present

## 2023-10-08 DIAGNOSIS — R600 Localized edema: Secondary | ICD-10-CM | POA: Diagnosis not present

## 2023-10-08 DIAGNOSIS — Z9181 History of falling: Secondary | ICD-10-CM | POA: Diagnosis not present

## 2023-10-10 DIAGNOSIS — R600 Localized edema: Secondary | ICD-10-CM | POA: Diagnosis not present

## 2023-10-10 DIAGNOSIS — Z9181 History of falling: Secondary | ICD-10-CM | POA: Diagnosis not present

## 2023-10-10 DIAGNOSIS — M25561 Pain in right knee: Secondary | ICD-10-CM | POA: Diagnosis not present

## 2023-10-10 DIAGNOSIS — M3500 Sicca syndrome, unspecified: Secondary | ICD-10-CM | POA: Diagnosis not present

## 2023-10-10 DIAGNOSIS — M6284 Sarcopenia: Secondary | ICD-10-CM | POA: Diagnosis not present

## 2023-10-10 DIAGNOSIS — M25562 Pain in left knee: Secondary | ICD-10-CM | POA: Diagnosis not present

## 2023-10-15 DIAGNOSIS — M6284 Sarcopenia: Secondary | ICD-10-CM | POA: Diagnosis not present

## 2023-10-15 DIAGNOSIS — M25561 Pain in right knee: Secondary | ICD-10-CM | POA: Diagnosis not present

## 2023-10-15 DIAGNOSIS — Z9181 History of falling: Secondary | ICD-10-CM | POA: Diagnosis not present

## 2023-10-15 DIAGNOSIS — R600 Localized edema: Secondary | ICD-10-CM | POA: Diagnosis not present

## 2023-10-15 DIAGNOSIS — M3500 Sicca syndrome, unspecified: Secondary | ICD-10-CM | POA: Diagnosis not present

## 2023-10-15 DIAGNOSIS — M25562 Pain in left knee: Secondary | ICD-10-CM | POA: Diagnosis not present

## 2023-10-16 ENCOUNTER — Encounter (HOSPITAL_COMMUNITY): Payer: Self-pay

## 2023-10-16 ENCOUNTER — Emergency Department (HOSPITAL_COMMUNITY)

## 2023-10-16 ENCOUNTER — Other Ambulatory Visit: Payer: Self-pay

## 2023-10-16 ENCOUNTER — Inpatient Hospital Stay (HOSPITAL_COMMUNITY)
Admission: EM | Admit: 2023-10-16 | Discharge: 2023-10-23 | DRG: 184 | Disposition: A | Discharge status: Skilled Nursing Facility | Attending: Internal Medicine | Admitting: Internal Medicine

## 2023-10-16 ENCOUNTER — Inpatient Hospital Stay (HOSPITAL_COMMUNITY)

## 2023-10-16 DIAGNOSIS — E785 Hyperlipidemia, unspecified: Secondary | ICD-10-CM | POA: Diagnosis not present

## 2023-10-16 DIAGNOSIS — M4856XA Collapsed vertebra, not elsewhere classified, lumbar region, initial encounter for fracture: Secondary | ICD-10-CM | POA: Diagnosis not present

## 2023-10-16 DIAGNOSIS — M85871 Other specified disorders of bone density and structure, right ankle and foot: Secondary | ICD-10-CM | POA: Diagnosis not present

## 2023-10-16 DIAGNOSIS — W19XXXA Unspecified fall, initial encounter: Principal | ICD-10-CM

## 2023-10-16 DIAGNOSIS — R4182 Altered mental status, unspecified: Secondary | ICD-10-CM | POA: Diagnosis not present

## 2023-10-16 DIAGNOSIS — I700 Atherosclerosis of aorta: Secondary | ICD-10-CM | POA: Diagnosis not present

## 2023-10-16 DIAGNOSIS — G9389 Other specified disorders of brain: Secondary | ICD-10-CM | POA: Diagnosis not present

## 2023-10-16 DIAGNOSIS — M7731 Calcaneal spur, right foot: Secondary | ICD-10-CM | POA: Diagnosis not present

## 2023-10-16 DIAGNOSIS — R0781 Pleurodynia: Secondary | ICD-10-CM | POA: Diagnosis not present

## 2023-10-16 DIAGNOSIS — S2220XA Unspecified fracture of sternum, initial encounter for closed fracture: Secondary | ICD-10-CM | POA: Diagnosis not present

## 2023-10-16 DIAGNOSIS — M542 Cervicalgia: Secondary | ICD-10-CM | POA: Diagnosis not present

## 2023-10-16 DIAGNOSIS — R079 Chest pain, unspecified: Secondary | ICD-10-CM | POA: Diagnosis not present

## 2023-10-16 DIAGNOSIS — S2249XA Multiple fractures of ribs, unspecified side, initial encounter for closed fracture: Secondary | ICD-10-CM | POA: Diagnosis present

## 2023-10-16 DIAGNOSIS — Y92092 Bedroom in other non-institutional residence as the place of occurrence of the external cause: Secondary | ICD-10-CM

## 2023-10-16 DIAGNOSIS — I5032 Chronic diastolic (congestive) heart failure: Secondary | ICD-10-CM | POA: Diagnosis not present

## 2023-10-16 DIAGNOSIS — R918 Other nonspecific abnormal finding of lung field: Secondary | ICD-10-CM | POA: Diagnosis not present

## 2023-10-16 DIAGNOSIS — Z7401 Bed confinement status: Secondary | ICD-10-CM | POA: Diagnosis not present

## 2023-10-16 DIAGNOSIS — H9190 Unspecified hearing loss, unspecified ear: Secondary | ICD-10-CM

## 2023-10-16 DIAGNOSIS — W06XXXA Fall from bed, initial encounter: Secondary | ICD-10-CM | POA: Diagnosis present

## 2023-10-16 DIAGNOSIS — M19071 Primary osteoarthritis, right ankle and foot: Secondary | ICD-10-CM | POA: Diagnosis not present

## 2023-10-16 DIAGNOSIS — R54 Age-related physical debility: Secondary | ICD-10-CM

## 2023-10-16 DIAGNOSIS — S2241XA Multiple fractures of ribs, right side, initial encounter for closed fracture: Secondary | ICD-10-CM | POA: Diagnosis not present

## 2023-10-16 DIAGNOSIS — I10 Essential (primary) hypertension: Secondary | ICD-10-CM | POA: Diagnosis not present

## 2023-10-16 DIAGNOSIS — N182 Chronic kidney disease, stage 2 (mild): Secondary | ICD-10-CM | POA: Diagnosis present

## 2023-10-16 DIAGNOSIS — I771 Stricture of artery: Secondary | ICD-10-CM | POA: Diagnosis not present

## 2023-10-16 DIAGNOSIS — S2241XD Multiple fractures of ribs, right side, subsequent encounter for fracture with routine healing: Secondary | ICD-10-CM | POA: Diagnosis not present

## 2023-10-16 DIAGNOSIS — Z79899 Other long term (current) drug therapy: Secondary | ICD-10-CM | POA: Diagnosis not present

## 2023-10-16 DIAGNOSIS — I130 Hypertensive heart and chronic kidney disease with heart failure and stage 1 through stage 4 chronic kidney disease, or unspecified chronic kidney disease: Secondary | ICD-10-CM | POA: Diagnosis not present

## 2023-10-16 DIAGNOSIS — Z888 Allergy status to other drugs, medicaments and biological substances status: Secondary | ICD-10-CM

## 2023-10-16 DIAGNOSIS — Y92009 Unspecified place in unspecified non-institutional (private) residence as the place of occurrence of the external cause: Secondary | ICD-10-CM | POA: Diagnosis not present

## 2023-10-16 DIAGNOSIS — M3500 Sicca syndrome, unspecified: Secondary | ICD-10-CM | POA: Diagnosis not present

## 2023-10-16 DIAGNOSIS — M8588 Other specified disorders of bone density and structure, other site: Secondary | ICD-10-CM | POA: Diagnosis not present

## 2023-10-16 DIAGNOSIS — E611 Iron deficiency: Secondary | ICD-10-CM | POA: Diagnosis present

## 2023-10-16 DIAGNOSIS — S0231XA Fracture of orbital floor, right side, initial encounter for closed fracture: Secondary | ICD-10-CM | POA: Diagnosis not present

## 2023-10-16 DIAGNOSIS — Z743 Need for continuous supervision: Secondary | ICD-10-CM | POA: Diagnosis not present

## 2023-10-16 DIAGNOSIS — R9431 Abnormal electrocardiogram [ECG] [EKG]: Secondary | ICD-10-CM | POA: Diagnosis not present

## 2023-10-16 DIAGNOSIS — Z974 Presence of external hearing-aid: Secondary | ICD-10-CM

## 2023-10-16 DIAGNOSIS — Z885 Allergy status to narcotic agent status: Secondary | ICD-10-CM | POA: Diagnosis not present

## 2023-10-16 DIAGNOSIS — R102 Pelvic and perineal pain: Secondary | ICD-10-CM | POA: Diagnosis not present

## 2023-10-16 DIAGNOSIS — Z8781 Personal history of (healed) traumatic fracture: Secondary | ICD-10-CM

## 2023-10-16 DIAGNOSIS — M549 Dorsalgia, unspecified: Secondary | ICD-10-CM | POA: Diagnosis not present

## 2023-10-16 DIAGNOSIS — R0902 Hypoxemia: Secondary | ICD-10-CM | POA: Diagnosis not present

## 2023-10-16 DIAGNOSIS — G8929 Other chronic pain: Secondary | ICD-10-CM | POA: Diagnosis not present

## 2023-10-16 DIAGNOSIS — J90 Pleural effusion, not elsewhere classified: Secondary | ICD-10-CM | POA: Diagnosis not present

## 2023-10-16 DIAGNOSIS — M8580 Other specified disorders of bone density and structure, unspecified site: Secondary | ICD-10-CM | POA: Diagnosis not present

## 2023-10-16 DIAGNOSIS — M4854XA Collapsed vertebra, not elsewhere classified, thoracic region, initial encounter for fracture: Secondary | ICD-10-CM | POA: Diagnosis not present

## 2023-10-16 DIAGNOSIS — Z66 Do not resuscitate: Secondary | ICD-10-CM | POA: Diagnosis not present

## 2023-10-16 DIAGNOSIS — R519 Headache, unspecified: Secondary | ICD-10-CM | POA: Diagnosis not present

## 2023-10-16 DIAGNOSIS — R0789 Other chest pain: Secondary | ICD-10-CM | POA: Diagnosis not present

## 2023-10-16 DIAGNOSIS — H9193 Unspecified hearing loss, bilateral: Secondary | ICD-10-CM | POA: Diagnosis not present

## 2023-10-16 DIAGNOSIS — M79671 Pain in right foot: Secondary | ICD-10-CM | POA: Diagnosis not present

## 2023-10-16 DIAGNOSIS — R531 Weakness: Secondary | ICD-10-CM | POA: Diagnosis not present

## 2023-10-16 LAB — URINALYSIS, ROUTINE W REFLEX MICROSCOPIC
Bilirubin Urine: NEGATIVE
Glucose, UA: NEGATIVE mg/dL
Hgb urine dipstick: NEGATIVE
Ketones, ur: NEGATIVE mg/dL
Nitrite: NEGATIVE
Protein, ur: NEGATIVE mg/dL
Specific Gravity, Urine: 1.011 (ref 1.005–1.030)
pH: 8 (ref 5.0–8.0)

## 2023-10-16 LAB — CBC WITH DIFFERENTIAL/PLATELET
Abs Immature Granulocytes: 0.03 10*3/uL (ref 0.00–0.07)
Basophils Absolute: 0 10*3/uL (ref 0.0–0.1)
Basophils Relative: 0 %
Eosinophils Absolute: 0 10*3/uL (ref 0.0–0.5)
Eosinophils Relative: 0 %
HCT: 39.5 % (ref 36.0–46.0)
Hemoglobin: 13.2 g/dL (ref 12.0–15.0)
Immature Granulocytes: 0 %
Lymphocytes Relative: 10 %
Lymphs Abs: 0.6 10*3/uL — ABNORMAL LOW (ref 0.7–4.0)
MCH: 32.9 pg (ref 26.0–34.0)
MCHC: 33.4 g/dL (ref 30.0–36.0)
MCV: 98.5 fL (ref 80.0–100.0)
Monocytes Absolute: 0.8 10*3/uL (ref 0.1–1.0)
Monocytes Relative: 11 %
Neutro Abs: 5.3 10*3/uL (ref 1.7–7.7)
Neutrophils Relative %: 79 %
Platelets: 149 10*3/uL — ABNORMAL LOW (ref 150–400)
RBC: 4.01 MIL/uL (ref 3.87–5.11)
RDW: 14.2 % (ref 11.5–15.5)
WBC: 6.7 10*3/uL (ref 4.0–10.5)
nRBC: 0 % (ref 0.0–0.2)

## 2023-10-16 LAB — I-STAT CHEM 8, ED
BUN: 18 mg/dL (ref 8–23)
Calcium, Ion: 1.14 mmol/L — ABNORMAL LOW (ref 1.15–1.40)
Chloride: 101 mmol/L (ref 98–111)
Creatinine, Ser: 0.6 mg/dL (ref 0.44–1.00)
Glucose, Bld: 113 mg/dL — ABNORMAL HIGH (ref 70–99)
HCT: 39 % (ref 36.0–46.0)
Hemoglobin: 13.3 g/dL (ref 12.0–15.0)
Potassium: 4.5 mmol/L (ref 3.5–5.1)
Sodium: 136 mmol/L (ref 135–145)
TCO2: 27 mmol/L (ref 22–32)

## 2023-10-16 LAB — TROPONIN I (HIGH SENSITIVITY)
Troponin I (High Sensitivity): 18 ng/L — ABNORMAL HIGH (ref <18)
Troponin I (High Sensitivity): 16 ng/L (ref <18)

## 2023-10-16 MED ORDER — AMLODIPINE BESYLATE 5 MG PO TABS
2.5000 mg | ORAL_TABLET | Freq: Every day | ORAL | Status: DC
  Administered 2023-10-17 – 2023-10-23 (×7): 2.5 mg via ORAL
  Filled 2023-10-16 (×7): qty 1

## 2023-10-16 MED ORDER — POLYETHYLENE GLYCOL 3350 17 G PO PACK: 17.0000 g | PACK | Freq: Every day | ORAL | Status: DC | PRN

## 2023-10-16 MED ORDER — POLYVINYL ALCOHOL 1.4 % OP SOLN
1.0000 [drp] | Freq: Four times a day (QID) | OPHTHALMIC | Status: DC
  Administered 2023-10-16 – 2023-10-23 (×25): 1 [drp] via OPHTHALMIC
  Filled 2023-10-16: qty 15

## 2023-10-16 MED ORDER — ACETAMINOPHEN 325 MG PO TABS
650.0000 mg | ORAL_TABLET | Freq: Once | ORAL | Status: AC
  Administered 2023-10-16: 650 mg via ORAL
  Filled 2023-10-16: qty 2

## 2023-10-16 MED ORDER — FENTANYL CITRATE PF 50 MCG/ML IJ SOSY
25.0000 ug | PREFILLED_SYRINGE | Freq: Once | INTRAMUSCULAR | Status: DC
  Filled 2023-10-16: qty 1

## 2023-10-16 MED ORDER — ONDANSETRON HCL 4 MG PO TABS: 4.0000 mg | ORAL_TABLET | Freq: Four times a day (QID) | ORAL | Status: DC | PRN

## 2023-10-16 MED ORDER — ONDANSETRON HCL 4 MG/2ML IJ SOLN: 4.0000 mg | Freq: Four times a day (QID) | INTRAMUSCULAR | Status: DC | PRN

## 2023-10-16 MED ORDER — ACETAMINOPHEN 325 MG PO TABS
650.0000 mg | ORAL_TABLET | Freq: Four times a day (QID) | ORAL | Status: DC
  Administered 2023-10-16 – 2023-10-23 (×19): 650 mg via ORAL
  Filled 2023-10-16 (×20): qty 2

## 2023-10-16 MED ORDER — LIDOCAINE 5 % EX PTCH
1.0000 | MEDICATED_PATCH | CUTANEOUS | Status: DC
  Administered 2023-10-16 – 2023-10-22 (×7): 1 via TRANSDERMAL
  Filled 2023-10-16 (×7): qty 1

## 2023-10-16 MED ORDER — HYDROMORPHONE HCL 2 MG PO TABS
1.0000 mg | ORAL_TABLET | Freq: Four times a day (QID) | ORAL | Status: DC | PRN
  Administered 2023-10-17: 1 mg via ORAL
  Filled 2023-10-16: qty 1

## 2023-10-16 MED ORDER — ENOXAPARIN SODIUM 40 MG/0.4ML IJ SOSY
40.0000 mg | PREFILLED_SYRINGE | INTRAMUSCULAR | Status: DC
  Administered 2023-10-17 – 2023-10-23 (×7): 40 mg via SUBCUTANEOUS
  Filled 2023-10-16 (×7): qty 0.4

## 2023-10-16 NOTE — ED Notes (Signed)
 Pt family at bedside. Pt family states that pt normally uses a wheelchair, uses a walker only with PT. Johana, PA notified

## 2023-10-16 NOTE — ED Triage Notes (Signed)
 Patient presented from [facility] living facility, per EMS reports patient was sleeping in bed and rolled off her bed onto the floor this morning. Endorses being on floor for around 2 hours. Patient denies hitting head, denies LOC, denies taking blood thinners. Pt endorses Right rib and R ankle pain. A&Ox4.

## 2023-10-16 NOTE — Hospital Course (Addendum)
 HPI: Lauren Davidson is a 89 y.o. female with medical history significant of HFpEF, hypertension, Sjogren's syndrome, and chronic back pain who presents to the ED after a fall from her bed.   Patient states that she was asleep and rolled out of her bed.  She fell on the floor onto her R side.  She was also noted to have some right foot pain.  She states that her wheel chair was near her bed and she likely hit this.  She denies losing consciousness.  S she does have some pain with taking deep breaths.  He denies dysuria, headache, abdominal pain, dyspnea.  Significant Events: Admitted 10/16/2023 for right rib fractures   Admission Labs: UA spg 1.011, negative nitrite, Large LE, WBC 6-10, few bacteria WBC 6.7, HgB 13.2, plt 149 Na 133, K 4.1, CO2 of 23,  BUN 16, Scr 0.66. glu 95 Vit B12 364 Folate 24 B12 364 Ferritin 46 Fe 61, TIBC 312  Admission Imaging Studies: CXR Enlarged cardiopericardial silhouette with calcified aorta. Slight opacity left lung base. Recommend follow-up. Uptake in cement seen at several vertebral levels. Is also multilevel compression deformities in thoracic spine. One level in the lower thoracic spine is progressive compared to available x-ray of May 2024. Please correlate with time course of this compression and if needed further workup with CT or MRI as clinically appropriate. Pelvic XR No acute osseous abnormality.  Diffuse osteopenia. 2. Prior ORIF of a healed left proximal femoral intertrochanteric fracture. 3. Grossly unchanged severe L4 compression fracture. Right foot XR No acute osseous abnormality.  Diffuse osteopenia. 2. 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 CT head No acute intracranial abnormality or acute traumatic injury identified CT c-spine No acute traumatic injury identified in the cervical spine. 2. Mild chronic T3 compression fracture with endplate sclerosis since January CT chest Possible nondisplaced fractures of the right fourth and sixth  ribs. No pneumothorax. 2. Age indeterminate compression deformities in the thoracic spine detailed above. Correlate clinically for point tenderness. 3. Suspected chronic sternal fracture. Correlate with point tenderness. 4. Diffuse osteopenia.  Significant Labs:   Significant Imaging Studies:   Antibiotic Therapy: Anti-infectives (From admission, onward)    None       Procedures:   Consultants:

## 2023-10-16 NOTE — ED Provider Notes (Signed)
 [place] EMERGENCY DEPARTMENT AT [facility] Provider Note   CSN: [id number] Arrival date & time: 10/16/23  4098     History Hyperlipidemia Chief Complaint  Patient presents with   Lauren Davidson is a 89 y.o. female.  89 year old with a past medical history of hyperlipidemia presents to the ED with a chief complaint of right foot pain status post mechanical fall.  Patient reports she was in her bed this morning around 4 AM when suddenly she rolled over from the bed landing on the right side of her body.  Endorsing pain along the right foot along with the right ribs.  She reports there was a wheelchair next to her, she states she likely hit this.  She did not lose consciousness, is currently not taking any blood thinners.  She was not given any medication for pain control.  According to EMS report, patient was found to have oxygen saturations less than 90% therefore she was put on 2 L nasal cannula, she is not on supplemental oxygen at home. No headache, no chest pain, no other complaints.   The history is provided by the patient.  Fall Pertinent negatives include no chest pain, no abdominal pain, no headaches and no shortness of breath.       Home Medications Prior to Admission medications   Medication Sig Start Date End Date Taking? Authorizing Provider  acetaminophen  (TYLENOL ) 500 MG tablet Take 500 mg by mouth every 6 (six) hours as needed (for pain).   Yes [provider]  amLODipine  (NORVASC ) 5 MG tablet Take 1 tablet (5 mg total) by mouth daily. 06/26/22  Yes Ozell Blunt, MD  Calcium  Carb-Cholecalciferol  (CALCIUM +D3 PO) Take 1 tablet by mouth daily with breakfast.   Yes [provider]  docusate sodium  (COLACE) 100 MG capsule Take 1 capsule (100 mg total) by mouth 2 (two) times daily. 09/20/21  Yes Oral Billings, MD  ibuprofen  (ADVIL ) 200 MG tablet Take 200 mg by mouth every 6 (six) hours as needed for moderate pain (pain score 4-6) or  mild pain (pain score 1-3).   Yes [provider]  Multiple Vitamins-Minerals (PRESERVISION AREDS 2) CAPS Take 1 capsule by mouth in the morning and at bedtime.   Yes [provider]  pantoprazole  (PROTONIX ) 40 MG tablet Take 40 mg by mouth every morning. 04/27/23  Yes [provider]  SYSTANE ULTRA PF 0.4-0.3 % SOLN Place 1 drop into both eyes in the morning, at noon, in the evening, and at bedtime.   Yes [provider]  oxycodone  (OXY-IR) 5 MG capsule Take 1 capsule (5 mg total) by mouth every 4 (four) hours as needed for pain. 10/22/23   Unk Garb, DO      Allergies    Statins, Buprenorphine hcl, Buspirone, Lexapro  [escitalopram ], Meperidine, and Morphine and codeine    Review of Systems   Review of Systems  Constitutional:  Negative for fever.  HENT:  Negative for sore throat.   Respiratory:  Negative for shortness of breath.   Cardiovascular:  Negative for chest pain.  Gastrointestinal:  Negative for abdominal pain, nausea and vomiting.  Genitourinary:  Negative for flank pain.  Musculoskeletal:  Positive for arthralgias and back pain.  Neurological:  Negative for light-headedness and headaches.  All other systems reviewed and are negative.   Physical Exam Updated Vital Signs BP (!) 143/64   Pulse 64   Temp 97.6 F (36.4 C) (Oral)   Resp 16  Ht 5\' 5"  (1.651 m)   Wt 53.1 kg   SpO2 96%   BMI 19.48 kg/m  Physical Exam Vitals and nursing note reviewed.  Constitutional:      Appearance: Normal appearance.  HENT:     Head: Normocephalic.     Comments: No palpable goose eggs or obvious deformity noted.    Mouth/Throat:     Mouth: Mucous membranes are moist.  Cardiovascular:     Rate and Rhythm: Normal rate.     Pulses:          Dorsalis pedis pulses are 2+ on the right side and 2+ on the left side.  Pulmonary:     Effort: Pulmonary effort is normal.     Breath sounds: No wheezing.     Comments: No absent lung sounds. Abdominal:      General: Abdomen is flat.     Palpations: Abdomen is soft.     Tenderness: There is no abdominal tenderness.     Comments: No pain with palpation of the abdomen.  Musculoskeletal:     Cervical back: Normal range of motion and neck supple.  Skin:    General: Skin is warm and dry.  Neurological:     Mental Status: She is alert and oriented to person, place, and time.     ED Results / Procedures / Treatments   Labs (all labs ordered are listed, but only abnormal results are displayed) Labs Reviewed  CBC WITH DIFFERENTIAL/PLATELET - Abnormal; Notable for the following components:      Result Value   Platelets 149 (*)    Lymphs Abs 0.6 (*)    All other components within normal limits  URINALYSIS, ROUTINE W REFLEX MICROSCOPIC - Abnormal; Notable for the following components:   APPearance CLOUDY (*)    Leukocytes,Ua LARGE (*)    Bacteria, UA FEW (*)    All other components within normal limits  BASIC METABOLIC PANEL WITH GFR - Abnormal; Notable for the following components:   Sodium 133 (*)    Calcium  8.7 (*)    All other components within normal limits  CBC - Abnormal; Notable for the following components:   RBC 3.83 (*)    Platelets 147 (*)    All other components within normal limits  BASIC METABOLIC PANEL WITH GFR - Abnormal; Notable for the following components:   Glucose, Bld 112 (*)    All other components within normal limits  I-STAT CHEM 8, ED - Abnormal; Notable for the following components:   Glucose, Bld 113 (*)    Calcium , Ion 1.14 (*)    All other components within normal limits  TROPONIN I (HIGH SENSITIVITY) - Abnormal; Notable for the following components:   Troponin I (High Sensitivity) 18 (*)    All other components within normal limits  FOLATE  VITAMIN B12  FERRITIN  IRON  AND TIBC  TROPONIN I (HIGH SENSITIVITY)    EKG EKG Interpretation Date/Time:  Thursday Oct 16 2023 16:11:11 EDT Ventricular Rate:  76 PR Interval:  204 QRS Duration:  134 QT  Interval:  450 QTC Calculation: 506 R Axis:   97  Text Interpretation: Sinus rhythm RBBB and LPFB Confirmed by Rafael Bun [phone]) on 10/16/2023 5:40:18 PM  Radiology No results found.   Procedures Procedures    Medications Ordered in ED Medications  acetaminophen  (TYLENOL ) tablet 650 mg (650 mg Oral Given 10/16/23 1349)    ED Course/ Medical Decision Making/ A&P Clinical Course as of 10/23/23 1844  Thu Oct 16, 2023  1301 This is a very pleasant 89 year old female presents from [facility] with a mechanical fall this morning rolling out of bed and about 2-hour downtime.  Patient is complaining only of midsternal chest pain.  She had x-rays of CT imaging initially performed with no emergent findings, but on exam continues to have some right parasternal tenderness.  I have ordered a CT of the chest to better evaluate for potential fracture.  She is otherwise well-appearing, mildly hypertensive [MT]    Clinical Course User Index [MT] Trifan, Janalyn Me, MD                                 Medical Decision Making Amount and/or Complexity of Data Reviewed Labs: ordered. Radiology: ordered. ECG/medicine tests: ordered.  Risk OTC drugs. Decision regarding hospitalization.    This patient presents to the ED for concern of fall, this involves a number of treatment options, and is a complaint that carries with it a high risk of complications and morbidity.    Co morbidities: Discussed in HPI   Brief History:  See HPI.   EMR reviewed including pt PMHx, past surgical history and past visits to ER.   See HPI for more details   Lab Tests:  I ordered and independently interpreted labs.  The pertinent results include:    No labs have been ordered.    Imaging Studies:  CT head, CT cervical spine without any acute findings. Stray of the pelvis, foot, chest without any acute findings.  She is pending CT chest.  Medicines ordered:  I ordered medication  including tylenol   for pain control Reevaluation of the patient after these medicines showed that the patient improved I have reviewed the patients home medicines and have made adjustments as needed  Reevaluation:  After the interventions noted above I re-evaluated patient and found that they have :stayed the same   Social Determinants of Health:  The patient's social determinants of health were a factor in the care of this patient  Problem List / ED Course:  I spoke to family at the bedside who is aware that all findings and imaging have been normal thus far.  Patient is obtaining a CT of her chest in order to further evaluate and rule out any occult rib fracture.  She remains hemodynamically stable.  In addition, was told that patient's oxygen saturations run around 92% at the facility, she is currently on no supplemental oxygen.  When she first arrived to the ED, I did discontinue the O2 she was on 2 L nasal cannula satting at 100%.  She has remained above 95% here while in the ED. I did try to have patient ambulate with her assisted walker while in the ED, patient is refusing this at this time.  The family member at the bedside states that patient will only be on a wheelchair, spends a lot of time on the wheelchair, therefore she is not up for walking with her walker at this time.   Dispostion:  Patient care signed out to [facility] at shift change pending imaging. If CT chest is negative suspect she will   Portions of this note were generated with Dragon dictation software. Dictation errors may occur despite best attempts at proofreading.   Final Clinical Impression(s) / ED Diagnoses Final diagnoses:  Fall, initial encounter  Closed fracture of multiple ribs of right side, initial encounter  Closed  fracture of sternum, unspecified portion of sternum, initial encounter    Rx / DC Orders ED Discharge Orders          Ordered    Increase activity slowly        10/22/23 1646     Discharge instructions       Comments: 1. Follow up with your primary care provider in 1-2 weeks following discharge from hospital.   10/22/23 1646    Diet general        10/22/23 1646    Call MD for:  temperature >100.4        10/22/23 1646    Call MD for:  persistant nausea and vomiting        10/22/23 1646    Call MD for:  severe uncontrolled pain        10/22/23 1646    Call MD for:  redness, tenderness, or signs of infection (pain, swelling, redness, odor or green/yellow discharge around incision site)        10/22/23 1646    Call MD for:  difficulty breathing, headache or visual disturbances        10/22/23 1646    Call MD for:  hives        10/22/23 1646    Call MD for:  persistant dizziness or light-headedness        10/22/23 1646    Call MD for:  extreme fatigue        10/22/23 1646    oxycodone  (OXY-IR) 5 MG capsule  Every 4 hours PRN        10/22/23 1647    oxycodone  (OXY-IR) 5 MG capsule  Every 4 hours PRN,   Status:  Discontinued        10/20/23 1324              Tamsin Nader, PA-C 10/23/23 1844    Arvilla Birmingham, MD 10/24/23 [phone]

## 2023-10-16 NOTE — ED Notes (Signed)
 Pt back from ct

## 2023-10-16 NOTE — ED Provider Notes (Signed)
 Fall out of bed at her facility today.  Normally use wheel chair.  Awaits chest CT to r/o ribs fx.  Anticipate going back to facility.   Received signout from previous provider, please see her note for complete H&P.  This is a 89 year old female who suffered a fall this morning.  Patient states she accidentally rolled off her bed and landed on the right side of her body when she hits the ground.  She denies hitting her head or loss of consciousness.  She is not on any blood thinning medication.  She now endorsed having pain primarily to her chest especially her sternum region.  She normally uses a wheelchair to move about and sometimes use a rollator.  She also endorsed having some shortness of breath after the fall.  On exam patient has tenderness along her sternum.  Minimal tenderness to right side of chest.  No significant midline spine tenderness.  Able to move all 4 extremities.  -Labs ordered, independently viewed and interpreted by me.  Labs remarkable for mild elevated troponin of 18.  Normal WBC, normal H&H and normal electrolyte panel.  Urinalysis currently pending. -The patient was maintained on a cardiac monitor.  I personally viewed and interpreted the cardiac monitored which showed an underlying rhythm of: Sinus rhythm -Imaging independently viewed and interpreted by me and I agree with radiologist's interpretation.  Result remarkable for chest CT shows possible nondisplaced fractures of the right 4th and 6th ribs without pneumothorax.  Suspect chronic sternal fracture however patient does have point tenderness in this area.  Head and cervical spine CT scan unremarkable.  Chest x-ray shows enlarged cardiopericardial silhouette with calcified aorta with a slight opacity in the left lung base. -This patient presents to the ED for concern of fall, this involves an extensive number of treatment options, and is a complaint that carries with it a high risk of complications and morbidity.  The  differential diagnosis includes fx, dislocation, strain, sprain, contusion, internal injury -Co morbidities that complicate the patient evaluation includes HOH, sjogren's syndrome, CKD, GERD, osteoporosis -Treatment includes fentanyl , tylenol  -Reevaluation of the patient after these medicines showed that the patient improved -PCP office notes or outside notes reviewed -Discussion with [facility] who agrees to admit pt for pulmonary toilet and pain control due to ribs fx and suspect sternal fx.  Also will need to trend troponin as the first trop is 18.  Doubt cardiac contusion though -Escalation to admission/observation considered: patient is agreeable with admission   BP (!) 157/77   Pulse 76   Temp 97.9 F (36.6 C) (Oral)   Resp (!) 21   Ht 5\' 5"  (1.651 m)   Wt 54.4 kg   SpO2 92%   BMI 19.97 kg/m   Results for orders placed or performed during the hospital encounter of 10/16/23  I-stat chem 8, ED (not at [facility], [facility] or [facility])   Collection Time: 10/16/23  4:26 PM  Result Value Ref Range   Sodium 136 135 - 145 mmol/L   Potassium 4.5 3.5 - 5.1 mmol/L   Chloride 101 98 - 111 mmol/L   BUN 18 8 - 23 mg/dL   Creatinine, Ser 1.02 0.44 - 1.00 mg/dL   Glucose, Bld 725 (H) 70 - 99 mg/dL   Calcium , Ion 1.14 (L) 1.15 - 1.40 mmol/L   TCO2 27 22 - 32 mmol/L   Hemoglobin 13.3 12.0 - 15.0 g/dL   HCT 36.6 44.0 - 34.7 %  CBC with Differential   Collection  Time: 10/16/23  4:28 PM  Result Value Ref Range   WBC 6.7 4.0 - 10.5 K/uL   RBC 4.01 3.87 - 5.11 MIL/uL   Hemoglobin 13.2 12.0 - 15.0 g/dL   HCT 74.2 59.5 - 63.8 %   MCV 98.5 80.0 - 100.0 fL   MCH 32.9 26.0 - 34.0 pg   MCHC 33.4 30.0 - 36.0 g/dL   RDW 75.6 43.3 - 29.5 %   Platelets 149 (L) 150 - 400 K/uL   nRBC 0.0 0.0 - 0.2 %   Neutrophils Relative % 79 %   Neutro Abs 5.3 1.7 - 7.7 K/uL   Lymphocytes Relative 10 %   Lymphs Abs 0.6 (L) 0.7 - 4.0 K/uL   Monocytes Relative 11 %   Monocytes Absolute 0.8 0.1 - 1.0 K/uL   Eosinophils  Relative 0 %   Eosinophils Absolute 0.0 0.0 - 0.5 K/uL   Basophils Relative 0 %   Basophils Absolute 0.0 0.0 - 0.1 K/uL   Immature Granulocytes 0 %   Abs Immature Granulocytes 0.03 0.00 - 0.07 K/uL  Troponin I (High Sensitivity)   Collection Time: 10/16/23  4:28 PM  Result Value Ref Range   Troponin I (High Sensitivity) 18 (H) <18 ng/L   CT Chest Wo Contrast Result Date: 10/16/2023 CLINICAL DATA:  Rib fracture suspected EXAM: CT CHEST WITHOUT CONTRAST TECHNIQUE: Multidetector CT imaging of the chest was performed following the standard protocol without IV contrast. RADIATION DOSE REDUCTION: This exam was performed according to the departmental dose-optimization program which includes automated exposure control, adjustment of the mA and/or kV according to patient size and/or use of iterative reconstruction technique. COMPARISON:  Chest x-ray performed on the same date. FINDINGS: Cardiovascular: Enlarged heart. Calcific atherosclerosis of the left and right coronary territories. No thoracic aortic aneurysm. Mediastinum/Nodes: Nothing significant. Lungs/Pleura: Trace dependent bilateral pleural effusions. Granulomatous few to the lungs, most notably in the right lower lobe. Upper Abdomen: Nothing significant. Musculoskeletal: Post treatment changes are present from previous vertebral augmentation of T12, L1, and L2. additional age-indeterminate compression fractures are present at T6, T8, T9, and T11. Mild diffuse vertebral body height loss is present within the thoracic spine. Sternal deformity which is favored to be chronic. Chronic left ninth rib fracture. Possible nondisplaced right fourth and sixth rib fractures annotated. IMPRESSION: 1. Possible nondisplaced fractures of the right fourth and sixth ribs. No pneumothorax. 2. Age indeterminate compression deformities in the thoracic spine detailed above. Correlate clinically for point tenderness. 3. Suspected chronic sternal fracture. Correlate with  point tenderness. 4. Diffuse osteopenia. Electronically Signed   By: Reagan Camera M.D.   On: 10/16/2023 15:38   DG Pelvis 1-2 Views Result Date: 10/16/2023 CLINICAL DATA:  Pain after fall. EXAM: PELVIS - 1-2 VIEW COMPARISON:  05/10/2023. FINDINGS: Diffuse osteopenia, which limits the sensitivity of the exam. No acute fracture or dislocation. Prior ORIF of a healed left proximal femoral intertrochanteric fracture. Femoral heads are seated within the acetabula. Mild degenerative changes of the bilateral hips. Sacroiliac joints and pubic symphysis appear anatomically aligned. Prior L2 cement augmentation. Grossly unchanged severe L4 compression fracture. IMPRESSION: 1. No acute osseous abnormality.  Diffuse osteopenia. 2. Prior ORIF of a healed left proximal femoral intertrochanteric fracture. 3. Grossly unchanged severe L4 compression fracture. Electronically Signed   By: Mannie Seek M.D.   On: 10/16/2023 12:26   DG Foot Complete Right Result Date: 10/16/2023 CLINICAL DATA:  Pain after fall. EXAM: RIGHT FOOT COMPLETE - 3+ VIEW COMPARISON:  None Available. FINDINGS:  Diffuse osteopenia, which limits the sensitivity of the exam. There is no evidence of acute fracture or dislocation. Mild first MTP joint space narrowing. Diffuse interphalangeal joint space narrowing. Mild-to-moderate joint space narrowing of the TMT joints. Plantar calcaneal spur. Vascular calcifications are noted. IMPRESSION: 1. No acute osseous abnormality.  Diffuse osteopenia. 2. 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 Electronically Signed   By: Mannie Seek M.D.   On: 10/16/2023 12:24   DG Chest 2 View Result Date: 10/16/2023 CLINICAL DATA:  Pain after fall EXAM: CHEST - 2 VIEW COMPARISON:  X-ray 06/20/2023 FINDINGS: Enlarged cardiopericardial silhouette. Calcified and tortuous aorta. No pneumothorax or effusion. Slight opacity at the left lung base. Chronic interstitial changes identified. Overlapping cardiac leads. Multilevel  augmentation cement along lower thoracic spine vertebral levels. There is also compression deformities at several other levels in the thoracic spine with some kyphosis. Going back to a chest x-ray of 06/12/2022 the more moderate to severe compression deformity of the midthoracic spine is similar but the compression deformities along the lower thoracic spine appears to be increased. Exact level is difficult to determine on this examination. IMPRESSION: Enlarged cardiopericardial silhouette with calcified aorta. Slight opacity left lung base. Recommend follow-up. Uptake in cement seen at several vertebral levels. Is also multilevel compression deformities in thoracic spine. One level in the lower thoracic spine is progressive compared to available x-ray of May 2024. Please correlate with time course of this compression and if needed further workup with CT or MRI as clinically appropriate. Electronically Signed   By: Adrianna Horde M.D.   On: 10/16/2023 12:21   CT Cervical Spine Wo Contrast Result Date: 10/16/2023 CLINICAL DATA:  89 year old female status post fall from bed.  Pain. EXAM: CT CERVICAL SPINE WITHOUT CONTRAST TECHNIQUE: Multidetector CT imaging of the cervical spine was performed without intravenous contrast. Multiplanar CT image reconstructions were also generated. RADIATION DOSE REDUCTION: This exam was performed according to the departmental dose-optimization program which includes automated exposure control, adjustment of the mA and/or kV according to patient size and/or use of iterative reconstruction technique. COMPARISON:  Head CT today.  Cervical spine CT 06/20/2023. FINDINGS: Alignment: Stable chronic straightening of cervical lordosis. Skull base and vertebrae: Chronic osteopenia. Visualized skull base is intact. No atlanto-occipital dissociation. C1 and C2 appear intact and aligned. No acute osseous abnormality identified. Soft tissues and spinal canal: No prevertebral fluid or swelling. No  visible canal hematoma. Negative for age visible noncontrast neck soft tissues, left greater than right carotid bifurcation calcified atherosclerosis. Disc levels: Bulky and partially calcified chronic degenerative ligamentous hypertrophy about the odontoid. Mild for age cervical spine degeneration elsewhere. Upper chest: Interval sclerosis of a mild T3 superior endplate compression (series 6, image 34), appears to be chronic. Otherwise the visible upper thoracic levels appear intact. Mild apical lung scarring. IMPRESSION: 1. No acute traumatic injury identified in the cervical spine. 2. Mild chronic T3 compression fracture with endplate sclerosis since January. Electronically Signed   By: Marlise Simpers M.D.   On: 10/16/2023 12:01   CT HEAD WO CONTRAST ( ) Result Date: 10/16/2023 CLINICAL DATA:  89 year old female status post fall from bed.  Pain. EXAM: CT HEAD WITHOUT CONTRAST TECHNIQUE: Contiguous axial images were obtained from the base of the skull through the vertex without intravenous contrast. RADIATION DOSE REDUCTION: This exam was performed according to the departmental dose-optimization program which includes automated exposure control, adjustment of the mA and/or kV according to patient size and/or use of iterative reconstruction technique. COMPARISON:  Head CT 06/20/2023.  FINDINGS: Brain: Stable cerebral volume. No midline shift, ventriculomegaly, mass effect, evidence of mass lesion, intracranial hemorrhage or evidence of cortically based acute infarction. Stable gray-white matter differentiation throughout the brain. Patchy and confluent bilateral cerebral white matter hypodensity. Small chronic dystrophic calcifications in the anterior left frontal lobe white matter. Vascular: No suspicious intracranial vascular hyperdensity. Calcified atherosclerosis at the skull base. Skull: Chronic right orbital floor fracture. Calvarium appears stable and intact. No acute osseous abnormality identified.  Sinuses/Orbits: Visualized paranasal sinuses and mastoids are stable and well aerated. Other: Calcified scalp vessel atherosclerosis. No acute orbit or scalp soft tissue injury identified. IMPRESSION: No acute intracranial abnormality or acute traumatic injury identified. Electronically Signed   By: Marlise Simpers M.D.   On: 10/16/2023 11:58      Debbra Fairy, PA-C 10/16/23 1752    Sallyanne Creamer, DO 10/21/23 Isidoro Margarita

## 2023-10-16 NOTE — ED Notes (Signed)
 Pt requesting to eat, Vearl Georgia PA notified to see if diet order could be obtained.

## 2023-10-16 NOTE — ED Notes (Signed)
 ED Provider at bedside.

## 2023-10-17 DIAGNOSIS — S2241XA Multiple fractures of ribs, right side, initial encounter for closed fracture: Secondary | ICD-10-CM

## 2023-10-17 DIAGNOSIS — I10 Essential (primary) hypertension: Secondary | ICD-10-CM

## 2023-10-17 DIAGNOSIS — R531 Weakness: Secondary | ICD-10-CM

## 2023-10-17 DIAGNOSIS — G8929 Other chronic pain: Secondary | ICD-10-CM

## 2023-10-17 DIAGNOSIS — M549 Dorsalgia, unspecified: Secondary | ICD-10-CM

## 2023-10-17 LAB — CBC
HCT: 38 % (ref 36.0–46.0)
Hemoglobin: 12.6 g/dL (ref 12.0–15.0)
MCH: 32.9 pg (ref 26.0–34.0)
MCHC: 33.2 g/dL (ref 30.0–36.0)
MCV: 99.2 fL (ref 80.0–100.0)
Platelets: 147 10*3/uL — ABNORMAL LOW (ref 150–400)
RBC: 3.83 MIL/uL — ABNORMAL LOW (ref 3.87–5.11)
RDW: 14.3 % (ref 11.5–15.5)
WBC: 5.6 10*3/uL (ref 4.0–10.5)
nRBC: 0 % (ref 0.0–0.2)

## 2023-10-17 LAB — IRON AND TIBC
Iron: 61 ug/dL (ref 28–170)
Saturation Ratios: 20 % (ref 10.4–31.8)
TIBC: 312 ug/dL (ref 250–450)
UIBC: 251 ug/dL

## 2023-10-17 LAB — BASIC METABOLIC PANEL WITH GFR
Anion gap: 8 (ref 5–15)
BUN: 16 mg/dL (ref 8–23)
CO2: 23 mmol/L (ref 22–32)
Calcium: 8.7 mg/dL — ABNORMAL LOW (ref 8.9–10.3)
Chloride: 102 mmol/L (ref 98–111)
Creatinine, Ser: 0.66 mg/dL (ref 0.44–1.00)
GFR, Estimated: >60 mL/min (ref >60)
Glucose, Bld: 95 mg/dL (ref 70–99)
Potassium: 4.1 mmol/L (ref 3.5–5.1)
Sodium: 133 mmol/L — ABNORMAL LOW (ref 135–145)

## 2023-10-17 LAB — FOLATE: Folate: 24 ng/mL (ref >5.9)

## 2023-10-17 LAB — VITAMIN B12: Vitamin B-12: 364 pg/mL (ref 180–914)

## 2023-10-17 LAB — FERRITIN: Ferritin: 46 ng/mL (ref 11–307)

## 2023-10-17 MED ORDER — OYSTER SHELL CALCIUM/D3 500-5 MG-MCG PO TABS
1.0000 | ORAL_TABLET | Freq: Every day | ORAL | Status: DC
  Administered 2023-10-17 – 2023-10-23 (×7): 1 via ORAL
  Filled 2023-10-17 (×7): qty 1

## 2023-10-17 NOTE — Evaluation (Signed)
 Physical Therapy Evaluation Patient Details Name: Lauren Davidson MRN: [id number] DOB: Jun 01, 1921 Today's Date: 10/17/2023  History of Present Illness  89 y.o. female who presents to the ED from [facility] after a fall from her bed. Dx of R 4-6 rib fractures, chronic sternal fracture (from compressions per facility). Pt with medical history significant of HFpEF, hypertension, Sjogren's syndrome, and chronic back pain, Kyphoplasty Feb 2024 and May 2024, L IM femoral nail December 2024.  Clinical Impression  Pt admitted with above diagnosis. Min assist for bed mobility and transfers. Pt not able to ambulate this session 2* 7/10 sternal pain and fatigue. Patient will benefit from continued inpatient follow up therapy, <3 hours/day.  Pt currently with functional limitations due to the deficits listed below (see PT Problem List). Pt will benefit from acute skilled PT to increase their independence and safety with mobility to allow discharge.           If plan is discharge home, recommend the following: A little help with walking and/or transfers;A little help with bathing/dressing/bathroom;Assistance with cooking/housework;Assist for transportation;Help with stairs or ramp for entrance   Can travel by private vehicle   Yes    Equipment Recommendations None recommended by PT  Recommendations for Other Services       Functional Status Assessment Patient has had a recent decline in their functional status and demonstrates the ability to make significant improvements in function in a reasonable and predictable amount of time.     Precautions / Restrictions Precautions Precautions: Fall Recall of Precautions/Restrictions: Intact Restrictions Weight Bearing Restrictions Per Provider Order: No      Mobility  Bed Mobility Overal bed mobility: Needs Assistance Bed Mobility: Rolling, Sidelying to Sit Rolling: Min assist Sidelying to sit: Min assist, HOB elevated, Used rails       General  bed mobility comments: min A to roll and to raise trunk/advance BLEs off EOB    Transfers Overall transfer level: Needs assistance Equipment used: Rolling walker (2 wheels) Transfers: Sit to/from Stand, Bed to chair/wheelchair/BSC Sit to Stand: Min assist, +2 safety/equipment   Step pivot transfers: Min assist, +2 safety/equipment       General transfer comment: assist to power up, VCs hand placement. Pivoted to bedside commode, then to recliner    Ambulation/Gait                  Stairs            Wheelchair Mobility     Tilt Bed    Modified Rankin (Stroke Patients Only)       Balance Overall balance assessment: Needs assistance Sitting-balance support: Feet supported, No upper extremity supported Sitting balance-Leahy Scale: Fair     Standing balance support: Bilateral upper extremity supported, During functional activity, Reliant on assistive device for balance Standing balance-Leahy Scale: Poor                               Pertinent Vitals/Pain Pain Assessment Pain Assessment: 0-10 Pain Score: 7  Pain Location: sternum with movement Pain Descriptors / Indicators: Sore Pain Intervention(s): Limited activity within patient's tolerance, Monitored during session, Patient requesting pain meds-RN notified, Premedicated before session, Repositioned    Home Living Family/patient expects to be discharged to:: Assisted living                 Home Equipment: Rollator (4 wheels);Grab bars - tub/shower;Shower seat;Grab bars - toilet;Wheelchair - manual Additional  Comments: resides at [facility]    Prior Function Prior Level of Function : Needs assist             Mobility Comments: amb with rollator in room, self propels WC to dining room; several falls in past 6 months       Extremity/Trunk Assessment   Upper Extremity Assessment Upper Extremity Assessment: Defer to OT evaluation    Lower Extremity Assessment Lower  Extremity Assessment: RLE deficits/detail;LLE deficits/detail RLE Deficits / Details: knee ext +4/5 RLE Sensation: WNL LLE Deficits / Details: knee ext +4/5 LLE Sensation: WNL    Cervical / Trunk Assessment Cervical / Trunk Assessment: Kyphotic  Communication   Communication Factors Affecting Communication: Hearing impaired    Cognition Arousal: Alert Behavior During Therapy: WFL for tasks assessed/performed   PT - Cognitive impairments: No apparent impairments                         Following commands: Intact       Cueing       General Comments      Exercises     Assessment/Plan    PT Assessment Patient needs continued PT services  PT Problem List Decreased activity tolerance;Decreased mobility;Decreased balance;Pain       PT Treatment Interventions Therapeutic activities;Therapeutic exercise;Gait training;Patient/family education;Functional mobility training    PT Goals (Current goals can be found in the Care Plan section)  Acute Rehab PT Goals Patient Stated Goal: to get stronger PT Goal Formulation: With patient Time For Goal Achievement: 10/31/23 Potential to Achieve Goals: Good    Frequency Min 2X/week     Co-evaluation PT/OT/SLP Co-Evaluation/Treatment: Yes Reason for Co-Treatment: For patient/therapist safety;To address functional/ADL transfers PT goals addressed during session: Mobility/safety with mobility;Balance;Proper use of DME         AM-PAC PT "6 Clicks" Mobility  Outcome Measure Help needed turning from your back to your side while in a flat bed without using bedrails?: A Little Help needed moving from lying on your back to sitting on the side of a flat bed without using bedrails?: A Little Help needed moving to and from a bed to a chair (including a wheelchair)?: A Little Help needed standing up from a chair using your arms (e.g., wheelchair or bedside chair)?: A Little Help needed to walk in hospital room?: A Lot Help  needed climbing 3-5 steps with a railing? : Total 6 Click Score: 15    End of Session Equipment Utilized During Treatment: Gait belt Activity Tolerance: Patient limited by pain Patient left: with chair alarm set;in chair;with call bell/phone within reach Nurse Communication: Mobility status PT Visit Diagnosis: Difficulty in walking, not elsewhere classified (R26.2);Pain;History of falling (Z91.81)    Time: 1610-9604 PT Time Calculation (min) (ACUTE ONLY): 24 min   Charges:   PT Evaluation $PT Eval Moderate Complexity: 1 Mod   PT General Charges $$ ACUTE PT VISIT: 1 Visit        Daymon Evans PT 10/17/2023  Acute Rehabilitation Services  Office [phone]

## 2023-10-17 NOTE — Progress Notes (Signed)
 Progress Note   Patient: Lauren Davidson ZOX:[id number] DOB: 16-Sep-1921 DOA: 10/16/2023     1 DOS: the patient was seen and examined on 10/17/2023   Brief hospital course: Lauren Davidson is a 89 y.o. female with medical history significant of HFpEF, hypertension, Sjogren's syndrome, and chronic back pain who presents to the ED after a fall from her bed. She had right rib fractures admitted to [facility] service for pain control, therapy and safe dc plan.  Assessment and Plan: S/p mechanical fall Nondisplaced Rib fxs R fourth and sixth ribs Encourage incentive spirometry, flutter valve and pain control.  Out of bed to chair. PT/ OT evaluation for safe dc plan.   Suspected chronic sternal fracture  This was noted on the CT scan of patient's chest.  She does have some point tenderness in the area, though this could be due to a more superficial injury.  On patient's admission information to the [place] facility it was noted that she had a sternal fracture related to compressions, (patient does not recall this and there is no record in our EMR).  Doubt further intervention warranted at this time, consider c/s to CTS in the AM    H/o multiple compression fractures of T and Lspine Osteopenia s/p kyphoplasty and currently not symptomatic from this. Continue calcium  and vitamin D .   HTN BP better, continue amlodipine     H/o Iron  deficiency  No anemia  Iron  panel reviewed, iron  level good.     Out of bed to chair. Incentive spirometry. Nursing supportive care. Fall, aspiration precautions. Diet:  Diet Orders (From admission, onward)     Start     Ordered   10/16/23 1908  Diet regular Room service appropriate? Yes; Fluid consistency: Thin  Diet effective now       Question Answer Comment  Room service appropriate? Yes   Fluid consistency: Thin      10/16/23 1911           DVT prophylaxis: enoxaparin  (LOVENOX ) injection 40 mg Start: 10/17/23 1000  Level of care: Med-Surg   Code  Status: Limited: Do not attempt resuscitation (DNR) -DNR-LIMITED -Do Not Intubate/DNI   Subjective: Patient is seen and examined today morning. She is lying in bed. Has hard of hearing. Complains of pain with deep breathing. Daughter at bedside.  Physical Exam: Vitals:   10/17/23 0445 10/17/23 0857 10/17/23 0914 10/17/23 1315  BP: 133/64 (!) 158/74 (!) 158/74 120/60  Pulse: 69 77  66  Resp: 19 15  16   Temp: 97.8 F (36.6 C) 98.3 F (36.8 C)  97.6 F (36.4 C)  TempSrc: Oral     SpO2: 92% 90%  94%  Weight:      Height:        General - Elderly Caucasian female, no distress HEENT - PERRLA, EOMI, atraumatic head, hard of hearing Lung - Clear, basal rales, no rhonchi, wheezes. Heart - S1, S2 heard, no murmurs, rubs, trace pedal edema. Abdomen - Soft, non tender, bowel sounds good Neuro - Alert, awake and oriented x 3, non focal exam. Skin - Warm and dry.  Data Reviewed:      Latest Ref Rng & Units 10/17/2023    4:11 AM 10/16/2023    4:28 PM 10/16/2023    4:26 PM  CBC  WBC 4.0 - 10.5 K/uL 5.6  6.7    Hemoglobin 12.0 - 15.0 g/dL 81.1  91.4  78.2   Hematocrit 36.0 - 46.0 % 38.0  39.5  39.0  Platelets 150 - 400 K/uL 147  149        Latest Ref Rng & Units 10/17/2023    4:11 AM 10/16/2023    4:26 PM 06/20/2023    2:28 PM  BMP  Glucose 70 - 99 mg/dL 95  161  096   BUN 8 - 23 mg/dL 16  18  23    Creatinine 0.44 - 1.00 mg/dL 0.45  4.09  8.11   Sodium 135 - 145 mmol/L 133  136  136   Potassium 3.5 - 5.1 mmol/L 4.1  4.5  4.1   Chloride 98 - 111 mmol/L 102  101  96   CO2 22 - 32 mmol/L 23   27   Calcium  8.9 - 10.3 mg/dL 8.7   9.3    CT Chest Wo Contrast Result Date: 10/16/2023 CLINICAL DATA:  Rib fracture suspected EXAM: CT CHEST WITHOUT CONTRAST TECHNIQUE: Multidetector CT imaging of the chest was performed following the standard protocol without IV contrast. RADIATION DOSE REDUCTION: This exam was performed according to the departmental dose-optimization program which includes  automated exposure control, adjustment of the mA and/or kV according to patient size and/or use of iterative reconstruction technique. COMPARISON:  Chest x-ray performed on the same date. FINDINGS: Cardiovascular: Enlarged heart. Calcific atherosclerosis of the left and right coronary territories. No thoracic aortic aneurysm. Mediastinum/Nodes: Nothing significant. Lungs/Pleura: Trace dependent bilateral pleural effusions. Granulomatous few to the lungs, most notably in the right lower lobe. Upper Abdomen: Nothing significant. Musculoskeletal: Post treatment changes are present from previous vertebral augmentation of T12, L1, and L2. additional age-indeterminate compression fractures are present at T6, T8, T9, and T11. Mild diffuse vertebral body height loss is present within the thoracic spine. Sternal deformity which is favored to be chronic. Chronic left ninth rib fracture. Possible nondisplaced right fourth and sixth rib fractures annotated. IMPRESSION: 1. Possible nondisplaced fractures of the right fourth and sixth ribs. No pneumothorax. 2. Age indeterminate compression deformities in the thoracic spine detailed above. Correlate clinically for point tenderness. 3. Suspected chronic sternal fracture. Correlate with point tenderness. 4. Diffuse osteopenia. Electronically Signed   By: Reagan Camera M.D.   On: 10/16/2023 15:38   DG Pelvis 1-2 Views Result Date: 10/16/2023 CLINICAL DATA:  Pain after fall. EXAM: PELVIS - 1-2 VIEW COMPARISON:  05/10/2023. FINDINGS: Diffuse osteopenia, which limits the sensitivity of the exam. No acute fracture or dislocation. Prior ORIF of a healed left proximal femoral intertrochanteric fracture. Femoral heads are seated within the acetabula. Mild degenerative changes of the bilateral hips. Sacroiliac joints and pubic symphysis appear anatomically aligned. Prior L2 cement augmentation. Grossly unchanged severe L4 compression fracture. IMPRESSION: 1. No acute osseous abnormality.   Diffuse osteopenia. 2. Prior ORIF of a healed left proximal femoral intertrochanteric fracture. 3. Grossly unchanged severe L4 compression fracture. Electronically Signed   By: Mannie Seek M.D.   On: 10/16/2023 12:26   DG Foot Complete Right Result Date: 10/16/2023 CLINICAL DATA:  Pain after fall. EXAM: RIGHT FOOT COMPLETE - 3+ VIEW COMPARISON:  None Available. FINDINGS: Diffuse osteopenia, which limits the sensitivity of the exam. There is no evidence of acute fracture or dislocation. Mild first MTP joint space narrowing. Diffuse interphalangeal joint space narrowing. Mild-to-moderate joint space narrowing of the TMT joints. Plantar calcaneal spur. Vascular calcifications are noted. IMPRESSION: 1. No acute osseous abnormality.  Diffuse osteopenia. 2. 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 Electronically Signed   By: Mannie Seek M.D.   On: 10/16/2023 12:24   DG Chest 2  View Result Date: 10/16/2023 CLINICAL DATA:  Pain after fall EXAM: CHEST - 2 VIEW COMPARISON:  X-ray 06/20/2023 FINDINGS: Enlarged cardiopericardial silhouette. Calcified and tortuous aorta. No pneumothorax or effusion. Slight opacity at the left lung base. Chronic interstitial changes identified. Overlapping cardiac leads. Multilevel augmentation cement along lower thoracic spine vertebral levels. There is also compression deformities at several other levels in the thoracic spine with some kyphosis. Going back to a chest x-ray of 06/12/2022 the more moderate to severe compression deformity of the midthoracic spine is similar but the compression deformities along the lower thoracic spine appears to be increased. Exact level is difficult to determine on this examination. IMPRESSION: Enlarged cardiopericardial silhouette with calcified aorta. Slight opacity left lung base. Recommend follow-up. Uptake in cement seen at several vertebral levels. Is also multilevel compression deformities in thoracic spine. One level in the lower  thoracic spine is progressive compared to available x-ray of May 2024. Please correlate with time course of this compression and if needed further workup with CT or MRI as clinically appropriate. Electronically Signed   By: Adrianna Horde M.D.   On: 10/16/2023 12:21   CT Cervical Spine Wo Contrast Result Date: 10/16/2023 CLINICAL DATA:  89 year old female status post fall from bed.  Pain. EXAM: CT CERVICAL SPINE WITHOUT CONTRAST TECHNIQUE: Multidetector CT imaging of the cervical spine was performed without intravenous contrast. Multiplanar CT image reconstructions were also generated. RADIATION DOSE REDUCTION: This exam was performed according to the departmental dose-optimization program which includes automated exposure control, adjustment of the mA and/or kV according to patient size and/or use of iterative reconstruction technique. COMPARISON:  Head CT today.  Cervical spine CT 06/20/2023. FINDINGS: Alignment: Stable chronic straightening of cervical lordosis. Skull base and vertebrae: Chronic osteopenia. Visualized skull base is intact. No atlanto-occipital dissociation. C1 and C2 appear intact and aligned. No acute osseous abnormality identified. Soft tissues and spinal canal: No prevertebral fluid or swelling. No visible canal hematoma. Negative for age visible noncontrast neck soft tissues, left greater than right carotid bifurcation calcified atherosclerosis. Disc levels: Bulky and partially calcified chronic degenerative ligamentous hypertrophy about the odontoid. Mild for age cervical spine degeneration elsewhere. Upper chest: Interval sclerosis of a mild T3 superior endplate compression (series 6, image 34), appears to be chronic. Otherwise the visible upper thoracic levels appear intact. Mild apical lung scarring. IMPRESSION: 1. No acute traumatic injury identified in the cervical spine. 2. Mild chronic T3 compression fracture with endplate sclerosis since January. Electronically Signed   By: Marlise Simpers  M.D.   On: 10/16/2023 12:01   CT HEAD WO CONTRAST ( ) Result Date: 10/16/2023 CLINICAL DATA:  89 year old female status post fall from bed.  Pain. EXAM: CT HEAD WITHOUT CONTRAST TECHNIQUE: Contiguous axial images were obtained from the base of the skull through the vertex without intravenous contrast. RADIATION DOSE REDUCTION: This exam was performed according to the departmental dose-optimization program which includes automated exposure control, adjustment of the mA and/or kV according to patient size and/or use of iterative reconstruction technique. COMPARISON:  Head CT 06/20/2023. FINDINGS: Brain: Stable cerebral volume. No midline shift, ventriculomegaly, mass effect, evidence of mass lesion, intracranial hemorrhage or evidence of cortically based acute infarction. Stable gray-white matter differentiation throughout the brain. Patchy and confluent bilateral cerebral white matter hypodensity. Small chronic dystrophic calcifications in the anterior left frontal lobe white matter. Vascular: No suspicious intracranial vascular hyperdensity. Calcified atherosclerosis at the skull base. Skull: Chronic right orbital floor fracture. Calvarium appears stable and intact. No acute osseous abnormality  identified. Sinuses/Orbits: Visualized paranasal sinuses and mastoids are stable and well aerated. Other: Calcified scalp vessel atherosclerosis. No acute orbit or scalp soft tissue injury identified. IMPRESSION: No acute intracranial abnormality or acute traumatic injury identified. Electronically Signed   By: Marlise Simpers M.D.   On: 10/16/2023 11:58    Family Communication: Discussed with patient, daughter at bedside. They understand and agree. All questions answered.  Disposition: Status is: Inpatient Remains inpatient appropriate because: pain control, therapy eval for safe dc plan  Planned Discharge Destination: Rehab     Time spent: 40 min    Author: Aisha Hove, MD 10/17/2023 4:45 PM Secure  chat 7am to 7pm For on call review [email or web address].

## 2023-10-17 NOTE — H&P (Addendum)
 History and Physical    Patient: Lauren Davidson DGU:[id number] DOB: 11-Sep-1921 DOA: 10/16/2023 DOS: the patient was seen and examined on 10/17/2023 PCP: Tena Feeling, MD  Patient coming from: ALF/ILF  Chief Complaint:  Chief Complaint  Patient presents with   Fall   HPI: Lauren Davidson is a 89 y.o. female with medical history significant of HFpEF, hypertension, Sjogren's syndrome, and chronic back pain who presents to the ED after a fall from her bed.  Patient states that she was asleep and rolled out of her bed.  She fell on the floor onto her R side.  She was also noted to have some right foot pain.  She states that her wheel chair was near her bed and she likely hit this.  She denies losing consciousness.  S she does have some pain with taking deep breaths.  He denies dysuria, headache, abdominal pain, dyspnea.   Review of Systems: As mentioned in the history of present illness. All other systems reviewed and are negative. Past Medical History:  Diagnosis Date   Hyperlipidemia    Past Surgical History:  Procedure Laterality Date   FEMUR IM NAIL Left 05/11/2023   Procedure: INTRAMEDULLARY (IM) NAIL FEMORAL;  Surgeon: Ali Ink, MD;  Location: [facility];  Service: Orthopedics;  Laterality: Left;   IR KYPHO LUMBAR INC FX REDUCE BONE BX UNI/BIL CANNULATION INC/IMAGING  06/20/2022   IR KYPHO LUMBAR INC FX REDUCE BONE BX UNI/BIL CANNULATION INC/IMAGING  09/26/2022   IR KYPHO THORACIC WITH BONE BIOPSY  09/26/2022   Social History:  reports that she has never smoked. She has never used smokeless tobacco. She reports that she does not drink alcohol  and does not use drugs.  Allergies  Allergen Reactions   Statins Other (See Comments)    Muscle weakness and pain   Buprenorphine Hcl Other (See Comments)    Patient questioned this entry   Buspirone Other (See Comments)    Weakness    Lexapro  [Escitalopram ] Other (See Comments)    "Muscle problems"   Meperidine Other (See Comments)     Demerol- questionable allergy- reaction not recalled   Morphine And Codeine Rash    History reviewed. No pertinent family history.  Prior to Admission medications   Medication Sig Start Date End Date Taking? Authorizing Provider  acetaminophen  (TYLENOL ) 500 MG tablet Take 500 mg by mouth every 6 (six) hours as needed (for pain).   Yes [provider]  amLODipine  (NORVASC ) 5 MG tablet Take 1 tablet (5 mg total) by mouth daily. 06/26/22  Yes Ozell Blunt, MD  Calcium  Carb-Cholecalciferol  (CALCIUM +D3 PO) Take 1 tablet by mouth daily with breakfast.   Yes [provider]  docusate sodium  (COLACE) 100 MG capsule Take 1 capsule (100 mg total) by mouth 2 (two) times daily. 09/20/21  Yes Oral Billings, MD  ibuprofen  (ADVIL ) 200 MG tablet Take 200 mg by mouth every 6 (six) hours as needed for moderate pain (pain score 4-6) or mild pain (pain score 1-3).   Yes [provider]  Multiple Vitamins-Minerals (PRESERVISION AREDS 2) CAPS Take 1 capsule by mouth in the morning and at bedtime.   Yes [provider]  pantoprazole  (PROTONIX ) 40 MG tablet Take 40 mg by mouth every morning. 04/27/23  Yes [provider]  SYSTANE ULTRA PF 0.4-0.3 % SOLN Place 1 drop into both eyes in the morning, at noon, in the evening, and at bedtime.   Yes [provider]  ferrous gluconate  [facility])  324 MG tablet Take 1 tablet (324 mg total) by mouth daily with breakfast. Patient not taking: Reported on 10/16/2023 05/17/23   Daren Eck, DO    Physical Exam: Vitals:   10/16/23 1600 10/16/23 1911 10/16/23 2024 10/16/23 2025  BP: (!) 157/77  (!) 161/69 (!) 153/81  Pulse: 76  73   Resp: (!) 21  17   Temp:  97.9 F (36.6 C) 97.9 F (36.6 C)   TempSrc:  Axillary    SpO2: 92%  92%   Weight:      Height:       Physical Exam  Constitutional: In no distress.  Cardiovascular: Normal rate, regular rhythm. No lower extremity edema  Pulmonary: Non labored breathing on room  air, no wheezing or rales.   Abdominal: Soft. Normal bowel sounds. Non distended and non tender Musculoskeletal: Normal range of motion.     Neurological: Alert and oriented to person, place, and time. Exam limited by pain.  Skin: Skin is warm and dry.   Data Reviewed:     Latest Ref Rng & Units 10/16/2023    4:28 PM 10/16/2023    4:26 PM 06/20/2023    2:28 PM  CBC  WBC 4.0 - 10.5 K/uL 6.7   10.5   Hemoglobin 12.0 - 15.0 g/dL 17.6  16.0  73.7   Hematocrit 36.0 - 46.0 % 39.5  39.0  39.3   Platelets 150 - 400 K/uL 149   213       Latest Ref Rng & Units 10/16/2023    4:26 PM 06/20/2023    2:28 PM 05/10/2023    2:15 PM  BMP  Glucose 70 - 99 mg/dL 106  269  485   BUN 8 - 23 mg/dL 18  23  19    Creatinine 0.44 - 1.00 mg/dL 4.62  7.03  5.00   Sodium 135 - 145 mmol/L 136  136  132   Potassium 3.5 - 5.1 mmol/L 4.5  4.1  4.1   Chloride 98 - 111 mmol/L 101  96  98   CO2 22 - 32 mmol/L  27  23   Calcium  8.9 - 10.3 mg/dL  9.3  8.8      Assessment and Plan: #s/p mechanical fall #Nondisplaced Rib fxs R fourth and sixth ribs IS/flutter and pain control.   #Suspected chronic sternal fracture  This was noted on the CT scan of patient's chest.  She does have some point tenderness in the area, though this could be due to a more superficial injury.  On patient's admission information to the [facility] facility it was noted that she had a sternal fracture related to compressions, (patient does not recall this and there is no record in our EMR).  -Doubt further intervention warranted at this time, consider c/s to CTS in the AM   H/o multiple compression fractures of T and Lspine Osteopenia She is s/p kyphoplasty and currently not symptomatic from this   Continue calcium  and vitamin D    HTN Resume amlodipine    H/o Iron  deficiency  No anemia   Iron /TIBC/Ferritin/ %Sat    Component Value Date/Time   IRON  24 (L) 05/12/2023 1352   TIBC 308 05/12/2023 1352   FERRITIN 55 05/12/2023 1352    IRONPCTSAT 8 (L) 05/12/2023 1352   Recheck iron  levels Consider IV Fe      Advance Care Planning:   Code Status: Limited: Do not attempt resuscitation (DNR) -DNR-LIMITED -Do Not Intubate/DNI    Consults: None   Severity  of Illness: The appropriate patient status for this patient is OBSERVATION. Observation status is judged to be reasonable and necessary in order to provide the required intensity of service to ensure the patient's safety. The patient's presenting symptoms, physical exam findings, and initial radiographic and laboratory data in the context of their medical condition is felt to place them at decreased risk for further clinical deterioration. Furthermore, it is anticipated that the patient will be medically stable for discharge from the hospital within 2 midnights of admission.   Author: Joette Mustard, MD 10/17/2023 12:01 AM  For on call review [email or web address].

## 2023-10-17 NOTE — H&P (Incomplete)
 History and Physical    Patient: Lauren Davidson:[id number] DOB: 1921/09/20 DOA: 10/16/2023 DOS: the patient was seen and examined on 10/17/2023 PCP: Tena Feeling, MD  Patient coming from: ALF/ILF  Chief Complaint:  Chief Complaint  Patient presents with  . Fall   HPI: Lauren Davidson is a 89 y.o. female with medical history significant of ***  Review of Systems: As mentioned in the history of present illness. All other systems reviewed and are negative. Past Medical History:  Diagnosis Date  . Hyperlipidemia    Past Surgical History:  Procedure Laterality Date  . FEMUR IM NAIL Left 05/11/2023   Procedure: INTRAMEDULLARY (IM) NAIL FEMORAL;  Surgeon: Ali Ink, MD;  Location: [facility];  Service: Orthopedics;  Laterality: Left;  . IR KYPHO LUMBAR INC FX REDUCE BONE BX UNI/BIL CANNULATION INC/IMAGING  06/20/2022  . IR KYPHO LUMBAR INC FX REDUCE BONE BX UNI/BIL CANNULATION INC/IMAGING  09/26/2022  . IR KYPHO THORACIC WITH BONE BIOPSY  09/26/2022   Social History:  reports that she has never smoked. She has never used smokeless tobacco. She reports that she does not drink alcohol  and does not use drugs.  Allergies  Allergen Reactions  . Statins Other (See Comments)    Muscle weakness and pain  . Buprenorphine Hcl Other (See Comments)    Patient questioned this entry  . Buspirone Other (See Comments)    Weakness   . Lexapro  [Escitalopram ] Other (See Comments)    "Muscle problems"  . Meperidine Other (See Comments)    Demerol- questionable allergy- reaction not recalled  . Morphine And Codeine Rash    History reviewed. No pertinent family history.  Prior to Admission medications   Medication Sig Start Date End Date Taking? Authorizing Provider  acetaminophen  (TYLENOL ) 500 MG tablet Take 500 mg by mouth every 6 (six) hours as needed (for pain).   Yes [provider]  amLODipine  (NORVASC ) 5 MG tablet Take 1 tablet (5 mg total) by mouth daily. 06/26/22  Yes Ozell Blunt, MD  Calcium  Carb-Cholecalciferol  (CALCIUM +D3 PO) Take 1 tablet by mouth daily with breakfast.   Yes [provider]  docusate sodium  (COLACE) 100 MG capsule Take 1 capsule (100 mg total) by mouth 2 (two) times daily. 09/20/21  Yes Oral Billings, MD  ibuprofen  (ADVIL ) 200 MG tablet Take 200 mg by mouth every 6 (six) hours as needed for moderate pain (pain score 4-6) or mild pain (pain score 1-3).   Yes [provider]  Multiple Vitamins-Minerals (PRESERVISION AREDS 2) CAPS Take 1 capsule by mouth in the morning and at bedtime.   Yes [provider]  pantoprazole  (PROTONIX ) 40 MG tablet Take 40 mg by mouth every morning. 04/27/23  Yes [provider]  SYSTANE ULTRA PF 0.4-0.3 % SOLN Place 1 drop into both eyes in the morning, at noon, in the evening, and at bedtime.   Yes [provider]  ferrous gluconate  (FERGON) 324 MG tablet Take 1 tablet (324 mg total) by mouth daily with breakfast. Patient not taking: Reported on 10/16/2023 05/17/23   Daren Eck, DO    Physical Exam: Vitals:   10/16/23 1600 10/16/23 1911 10/16/23 2024 10/16/23 2025  BP: (!) 157/77  (!) 161/69 (!) 153/81  Pulse: 76  73   Resp: (!) 21  17   Temp:  97.9 F (36.6 C) 97.9 F (36.6 C)   TempSrc:  Axillary    SpO2: 92%  92%   Weight:  Height:       *** Data Reviewed: {Tip this will not be part of the note when signed- Document your independent interpretation of telemetry tracing, EKG, lab, Radiology test or any other diagnostic tests. Add any new diagnostic test ordered today. (Optional):26781}    Latest Ref Rng & Units 10/16/2023    4:28 PM 10/16/2023    4:26 PM 06/20/2023    2:28 PM  CBC  WBC 4.0 - 10.5 K/uL 6.7   10.5   Hemoglobin 12.0 - 15.0 g/dL 16.1  09.6  04.5   Hematocrit 36.0 - 46.0 % 39.5  39.0  39.3   Platelets 150 - 400 K/uL 149   213       Latest Ref Rng & Units 10/16/2023    4:26 PM 06/20/2023    2:28 PM 05/10/2023    2:15 PM  BMP  Glucose  70 - 99 mg/dL 409  811  914   BUN 8 - 23 mg/dL 18  23  19    Creatinine 0.44 - 1.00 mg/dL 7.82  9.56  2.13   Sodium 135 - 145 mmol/L 136  136  132   Potassium 3.5 - 5.1 mmol/L 4.5  4.1  4.1   Chloride 98 - 111 mmol/L 101  96  98   CO2 22 - 32 mmol/L  27  23   Calcium  8.9 - 10.3 mg/dL  9.3  8.8      Assessment and Plan: No notes have been filed under this hospital service. Service: Hospitalist     Advance Care Planning:   Code Status: Limited: Do not attempt resuscitation (DNR) -DNR-LIMITED -Do Not Intubate/DNI  ***  Consults: ***  Family Communication: ***  Severity of Illness: {Observation/Inpatient:21159}  Author: Joette Mustard, MD 10/17/2023 12:01 AM  For on call review [email or web address].

## 2023-10-17 NOTE — Evaluation (Signed)
 Occupational Therapy Evaluation Patient Details Name: Lauren Davidson MRN: [id number] DOB: February 03, 1922 Today's Date: 10/17/2023   History of Present Illness   89 y.o. female who presents to the ED from [facility] after a fall from her bed. Dx of R 4-6 rib fractures, chronic sternal fracture (from compressions per facility). Pt with medical history significant of HFpEF, hypertension, Sjogren's syndrome, and chronic back pain, Kyphoplasty Feb 2024 and May 2024, L IM femoral nail December 2024.     Clinical Impressions Prior to hospital admission, pt requires assist from staff for ADLs, uses 4WW or manual wheelchair for mobility and has falls hx. Pt lives at [facility], is [facility] but follows commands. Pt currently requires MIN A for transfers t/f bed > BSC > recliner using RW, MAX A for hygiene and clothing mgmt after BM. Pt would benefit from skilled OT services to address noted impairments and functional limitations (see below for any additional details) in order to maximize safety and independence while minimizing falls risk and caregiver burden. Anticipate the need for follow up OT services upon acute hospital DC. Patient will benefit from continued inpatient follow up therapy, <3 hours/day.      If plan is discharge home, recommend the following:   A lot of help with walking and/or transfers;A lot of help with bathing/dressing/bathroom;Direct supervision/assist for medications management;Help with stairs or ramp for entrance;Assist for transportation;Direct supervision/assist for financial management;Assistance with cooking/housework     Functional Status Assessment   Patient has had a recent decline in their functional status and demonstrates the ability to make significant improvements in function in a reasonable and predictable amount of time.     Equipment Recommendations   None recommended by OT      Precautions/Restrictions   Precautions Precautions: Fall Recall of  Precautions/Restrictions: Intact Restrictions Weight Bearing Restrictions Per Provider Order: No     Mobility Bed Mobility Overal bed mobility: Needs Assistance Bed Mobility: Rolling, Sidelying to Sit Rolling: Min assist Sidelying to sit: Min assist, HOB elevated, Used rails       General bed mobility comments: min A to roll and to raise trunk/advance BLEs off EOB    Transfers Overall transfer level: Needs assistance Equipment used: Rolling walker (2 wheels) Transfers: Sit to/from Stand, Bed to chair/wheelchair/BSC Sit to Stand: Min assist, +2 safety/equipment     Step pivot transfers: Min assist, +2 safety/equipment            Balance Overall balance assessment: Needs assistance, History of Falls Sitting-balance support: Feet supported, No upper extremity supported Sitting balance-Leahy Scale: Fair     Standing balance support: Bilateral upper extremity supported, During functional activity, Reliant on assistive device for balance Standing balance-Leahy Scale: Poor                             ADL either performed or assessed with clinical judgement   ADL Overall ADL's : Needs assistance/impaired Eating/Feeding: Sitting;Modified independent   Grooming: Wash/dry face;Wash/dry hands;Minimal assistance   Upper Body Bathing: Minimal assistance;Sitting   Lower Body Bathing: Maximal assistance;Sit to/from stand;Sitting/lateral leans   Upper Body Dressing : Minimal assistance;Sitting   Lower Body Dressing: Maximal assistance;Sitting/lateral leans;Sit to/from stand   Toilet Transfer: +2 for safety/equipment;Minimal assistance;Rolling walker (2 wheels);BSC/3in1 Toilet Transfer Details (indicate cue type and reason): bed > BSC > recliner, slow steps, requires assist to pivot hips and cues for BUE hand placement Toileting- Clothing Manipulation and Hygiene: Sitting/lateral lean;Sit to/from stand;Maximal  assistance Toileting - Clothing Manipulation Details  (indicate cue type and reason): pt performing anterior pericare seated, stands and MAX A for posterior hygiene     Functional mobility during ADLs: Minimal assistance;+2 for safety/equipment;Rolling walker (2 wheels) General ADL Comments: pt limited by sternum pain, requires increased time to perform step pivot transfers     Vision Baseline Vision/History: 1 Wears glasses Ability to See in Adequate Light: 0 Adequate Patient Visual Report: No change from baseline              Pertinent Vitals/Pain Pain Assessment Pain Assessment: 0-10 Pain Score: 7  Pain Location: sternum with movement Pain Descriptors / Indicators: Sore, Discomfort, Grimacing, Guarding Pain Intervention(s): Limited activity within patient's tolerance, Patient requesting pain meds-RN notified, Premedicated before session, Repositioned     Extremity/Trunk Assessment Upper Extremity Assessment Upper Extremity Assessment: Generalized weakness   Lower Extremity Assessment Lower Extremity Assessment: Defer to PT evaluation RLE Deficits / Details: knee ext +4/5 RLE Sensation: WNL LLE Deficits / Details: knee ext +4/5 LLE Sensation: WNL   Cervical / Trunk Assessment Cervical / Trunk Assessment: Kyphotic   Communication Communication Communication: Impaired Factors Affecting Communication: Hearing impaired   Cognition Arousal: Alert Behavior During Therapy: WFL for tasks assessed/performed Cognition: No apparent impairments                               Following commands: Intact       Cueing  General Comments   Cueing Techniques: Verbal cues;Gestural cues  RN in room for pain meds end of session           Home Living Family/patient expects to be discharged to:: Assisted living                             Home Equipment: Rollator (4 wheels);Grab bars - tub/shower;Shower seat;Grab bars - toilet;Wheelchair - manual   Additional Comments: resides at [facility]       Prior Functioning/Environment Prior Level of Function : Needs assist             Mobility Comments: amb with rollator in room, self propels WC to dining room; several falls in past 6 months ADLs Comments: some assistance for bathing/dressing    OT Problem List: Decreased strength;Decreased range of motion;Decreased activity tolerance;Impaired balance (sitting and/or standing);Pain   OT Treatment/Interventions: Self-care/ADL training;Therapeutic exercise;Neuromuscular education;DME and/or AE instruction;Therapeutic activities;Balance training;Patient/family education      OT Goals(Current goals can be found in the care plan section)   Acute Rehab OT Goals OT Goal Formulation: With patient Time For Goal Achievement: 10/31/23 Potential to Achieve Goals: Good   OT Frequency:  Min 2X/week    Co-evaluation PT/OT/SLP Co-Evaluation/Treatment: Yes Reason for Co-Treatment: For patient/therapist safety;To address functional/ADL transfers PT goals addressed during session: Mobility/safety with mobility;Balance;Proper use of DME OT goals addressed during session: ADL's and self-care      AM-PAC OT "6 Clicks" Daily Activity     Outcome Measure Help from another person eating meals?: None Help from another person taking care of personal grooming?: A Little Help from another person toileting, which includes using toliet, bedpan, or urinal?: A Lot Help from another person bathing (including washing, rinsing, drying)?: A Lot Help from another person to put on and taking off regular upper body clothing?: A Lot Help from another person to put on and taking off regular lower body clothing?: A  Lot 6 Click Score: 15   End of Session Equipment Utilized During Treatment: Gait belt;Rolling walker (2 wheels) Nurse Communication: Mobility status;Patient requests pain meds  Activity Tolerance: Patient limited by pain Patient left: in chair;with chair alarm set;with nursing/sitter in room;with  call bell/phone within reach  OT Visit Diagnosis: Unsteadiness on feet (R26.81);Muscle weakness (generalized) (M62.81);History of falling (Z91.81);Other abnormalities of gait and mobility (R26.89);Pain Pain - Right/Left: Right Pain - part of body:  (ribs)                Time: 1610-9604 OT Time Calculation (min): 23 min Charges:  OT General Charges $OT Visit: 1 Visit OT Evaluation $OT Eval Low Complexity: 1 Low OT Treatments $Self Care/Home Management : 8-22 mins  Nery Kalisz L. Michail Boyte, OTR/L  10/17/23, 12:48 PM

## 2023-10-17 NOTE — TOC Initial Note (Addendum)
 Transition of Care [facility]) - Initial/Assessment Note    Patient Details  Name: Lauren Davidson MRN: [id number] Date of Birth: [birth year]  [facility]) CM/SW Contact:    Kathryn Parish, RN Phone Number: 10/17/2023, 9:23 AM  Clinical Narrative:                 Patient is from [facility]. CM spoke with Fredrik Jensen at [place] to confirm patient would be able to return at discharge. TOC will follow.  Expected Discharge Plan: Assisted Living Barriers to Discharge: Continued Medical Work up   Patient Goals and CMS Choice Patient states their goals for this hospitalization and ongoing recovery are:: Return to [facility]   Choice offered to / list presented to : NA      Expected Discharge Plan and Services   Discharge Planning Services: CM Consult Post Acute Care Choice: NA Living arrangements for the past 2 months: Assisted Living Facility                 DME Arranged: N/A DME Agency: NA       HH Arranged: NA HH Agency: NA        Prior Living Arrangements/Services Living arrangements for the past 2 months: Assisted Living Facility Lives with:: Self Patient language and need for interpreter reviewed:: Yes Do you feel safe going back to the place where you live?: Yes      Need for Family Participation in Patient Care: Yes (Comment)   Current home services: DME (walker,rollator, Harris, WC) Criminal Activity/Legal Involvement Pertinent to Current Situation/Hospitalization: No - Comment as needed  Activities of Daily Living      Permission Sought/Granted Permission sought to share information with : Case Manager Permission granted to share information with : Yes, Verbal Permission Granted  Share Information with NAME: [place] [facility]           Emotional Assessment Appearance:: Appears stated age Attitude/Demeanor/Rapport: Gracious Affect (typically observed): Appropriate Orientation: : Oriented to Self, Oriented to Place,  Oriented to  Time, Oriented to Situation Alcohol  / Substance Use: Not Applicable Psych Involvement: No (comment)  Admission diagnosis:  Rib fractures [S22.49XA] Fall, initial encounter [W19.XXXA] Closed fracture of multiple ribs of right side, initial encounter [S22.41XA] Closed fracture of sternum, unspecified portion of sternum, initial encounter [S22.20XA] Patient Active Problem List   Diagnosis Date Noted   Rib fractures 10/16/2023   HOH (hard of hearing) 05/16/2023   Acute blood loss anemia 05/16/2023   Closed displaced intertrochanteric fracture of left femur, initial encounter (HCC) 05/10/2023   Closed compression fracture of L2 lumbar vertebra, initial encounter (HCC) 06/16/2022   Lumbar compression fracture (HCC) 06/12/2022   Back pain 09/17/2021   Closed compression fracture of L4 lumbar vertebra, initial encounter (HCC) 09/17/2021   Lumbar spinal stenosis 09/17/2021   HTN (hypertension) 09/17/2021   HLD (hyperlipidemia) 09/17/2021   Gastroesophageal reflux disease without esophagitis 03/01/2019   Sjogren's syndrome (HCC) 05/18/2015   Chronic kidney disease (CKD), stage II (mild) 05/18/2015   Generalized anxiety disorder 05/18/2015   Osteoporosis 05/18/2015   Status post intraocular lens implant 11/14/2011   PCP:  Tena Feeling, MD Pharmacy:   [facility] [phone] [place], [place] - [address] [place] [address] Phone: [phone] Fax: [phone]  [id number] - [place]ette [place]le [facility] - [address] [place] [address] Phone: [phone] Fax: [phone]  [id number] - [place], [place] - [address] [place] [address] Phone: [phone] Fax: [phone]     Social Drivers of Health (SDOH) Social History: SDOH Screenings   Food Insecurity: No Food Insecurity (05/10/2023)  Housing: Low Risk  (05/10/2023)  Transportation Needs: No  Transportation Needs (05/10/2023)  Utilities: Not At Risk (05/10/2023)  Tobacco Use: Low Risk  (10/16/2023)   SDOH Interventions:     Readmission Risk Interventions     No data to display

## 2023-10-17 NOTE — Plan of Care (Signed)

## 2023-10-18 DIAGNOSIS — R531 Weakness: Secondary | ICD-10-CM | POA: Diagnosis not present

## 2023-10-18 DIAGNOSIS — I10 Essential (primary) hypertension: Secondary | ICD-10-CM | POA: Diagnosis not present

## 2023-10-18 DIAGNOSIS — S2241XA Multiple fractures of ribs, right side, initial encounter for closed fracture: Secondary | ICD-10-CM | POA: Diagnosis not present

## 2023-10-18 DIAGNOSIS — M549 Dorsalgia, unspecified: Secondary | ICD-10-CM | POA: Diagnosis not present

## 2023-10-18 MED ORDER — PROSIGHT PO TABS
1.0000 | ORAL_TABLET | Freq: Every day | ORAL | Status: DC
  Administered 2023-10-18 – 2023-10-23 (×6): 1 via ORAL
  Filled 2023-10-18 (×6): qty 1

## 2023-10-18 MED ORDER — DOCUSATE SODIUM 100 MG PO CAPS
100.0000 mg | ORAL_CAPSULE | Freq: Two times a day (BID) | ORAL | Status: DC
  Administered 2023-10-18 – 2023-10-23 (×10): 100 mg via ORAL
  Filled 2023-10-18 (×11): qty 1

## 2023-10-18 NOTE — Plan of Care (Signed)

## 2023-10-18 NOTE — Progress Notes (Signed)
 Progress Note   Patient: Lauren Davidson:[id number] DOB: 05/08/1922 DOA: 10/16/2023     2 DOS: the patient was seen and examined on 10/18/2023   Brief hospital course: Lauren Davidson is a 89 y.o. female with medical history significant of HFpEF, hypertension, Sjogren's syndrome, and chronic back pain who presents to the ED after a fall from her bed. She had right rib fractures admitted to [facility] service for pain control, therapy and safe dc plan.  Assessment and Plan: S/p mechanical fall Nondisplaced Rib fxs R fourth and sixth ribs Encourage incentive spirometry, flutter valve and pain control.  Out of bed to chair. PT/ OT advised SNF for short term. Discussed with patient, family at bedside. Case manager working on placement.   Suspected chronic sternal fracture  This was noted on the CT scan of patient's chest.  She does have some point tenderness in the area, though this could be due to a more superficial injury. Continue pain control. No intervention at this time.   H/o multiple compression fractures of T and Lspine Osteopenia s/p kyphoplasty and currently not symptomatic from this. Continue calcium  and vitamin D .   HTN BP better, continue amlodipine     H/o Iron  deficiency  No anemia  Iron  panel reviewed, iron  level good.    Out of bed to chair. Incentive spirometry. Nursing supportive care. Fall, aspiration precautions. Diet:  Diet Orders (From admission, onward)     Start     Ordered   10/16/23 1908  Diet regular Room service appropriate? Yes; Fluid consistency: Thin  Diet effective now       Question Answer Comment  Room service appropriate? Yes   Fluid consistency: Thin      10/16/23 1911           DVT prophylaxis: enoxaparin  (LOVENOX ) injection 40 mg Start: 10/17/23 1000  Level of care: Med-Surg   Code Status: Limited: Do not attempt resuscitation (DNR) -DNR-LIMITED -Do Not Intubate/DNI   Subjective: Patient is seen and examined today morning. She is  lying in bed. Feels weak. Has daughter and so-in-law at bedside. Eating poor.  Physical Exam: Vitals:   10/17/23 1315 10/17/23 1950 10/18/23 0526 10/18/23 0945  BP: 120/60 (!) 118/52 (!) 142/62 (!) 128/56  Pulse: 66 61 64 69  Resp: 16 17 17    Temp: 97.6 F (36.4 C) (!) 97.3 F (36.3 C) (!) 97.5 F (36.4 C)   TempSrc:  Oral Oral   SpO2: 94% 95% 91% 91%  Weight:      Height:        General - Elderly Caucasian female, mild distress due to pain. HEENT - PERRLA, EOMI, atraumatic head, hard of hearing Lung - Clear, basal rales, no rhonchi, wheezes. Heart - S1, S2 heard, no murmurs, rubs, trace pedal edema. Abdomen - Soft, non tender, bowel sounds good Neuro - Alert, awake and oriented x 3, non focal exam. Skin - Warm and dry.  Data Reviewed:      Latest Ref Rng & Units 10/17/2023    4:11 AM 10/16/2023    4:28 PM 10/16/2023    4:26 PM  CBC  WBC 4.0 - 10.5 K/uL 5.6  6.7    Hemoglobin 12.0 - 15.0 g/dL 95.6  21.3  08.6   Hematocrit 36.0 - 46.0 % 38.0  39.5  39.0   Platelets 150 - 400 K/uL 147  149        Latest Ref Rng & Units 10/17/2023    4:11 AM 10/16/2023  4:26 PM 06/20/2023    2:28 PM  BMP  Glucose 70 - 99 mg/dL 95  301  601   BUN 8 - 23 mg/dL 16  18  23    Creatinine 0.44 - 1.00 mg/dL 0.93  2.35  5.73   Sodium 135 - 145 mmol/L 133  136  136   Potassium 3.5 - 5.1 mmol/L 4.1  4.5  4.1   Chloride 98 - 111 mmol/L 102  101  96   CO2 22 - 32 mmol/L 23   27   Calcium  8.9 - 10.3 mg/dL 8.7   9.3    CT Chest Wo Contrast Result Date: 10/16/2023 CLINICAL DATA:  Rib fracture suspected EXAM: CT CHEST WITHOUT CONTRAST TECHNIQUE: Multidetector CT imaging of the chest was performed following the standard protocol without IV contrast. RADIATION DOSE REDUCTION: This exam was performed according to the departmental dose-optimization program which includes automated exposure control, adjustment of the mA and/or kV according to patient size and/or use of iterative reconstruction technique.  COMPARISON:  Chest x-ray performed on the same date. FINDINGS: Cardiovascular: Enlarged heart. Calcific atherosclerosis of the left and right coronary territories. No thoracic aortic aneurysm. Mediastinum/Nodes: Nothing significant. Lungs/Pleura: Trace dependent bilateral pleural effusions. Granulomatous few to the lungs, most notably in the right lower lobe. Upper Abdomen: Nothing significant. Musculoskeletal: Post treatment changes are present from previous vertebral augmentation of T12, L1, and L2. additional age-indeterminate compression fractures are present at T6, T8, T9, and T11. Mild diffuse vertebral body height loss is present within the thoracic spine. Sternal deformity which is favored to be chronic. Chronic left ninth rib fracture. Possible nondisplaced right fourth and sixth rib fractures annotated. IMPRESSION: 1. Possible nondisplaced fractures of the right fourth and sixth ribs. No pneumothorax. 2. Age indeterminate compression deformities in the thoracic spine detailed above. Correlate clinically for point tenderness. 3. Suspected chronic sternal fracture. Correlate with point tenderness. 4. Diffuse osteopenia. Electronically Signed   By: Lauren Davidson M.D.   On: 10/16/2023 15:38    Family Communication: Discussed with patient, family at bedside. They understand and agree. All questions answered.  Disposition: Status is: Inpatient Remains inpatient appropriate because: pain control, therapy eval for safe dc plan  Planned Discharge Destination: Skilled nursing facility     Time spent: 39 min    Author: Aisha Hove, MD 10/18/2023 12:20 PM Secure chat 7am to 7pm For on call review [email or web address].

## 2023-10-19 DIAGNOSIS — W19XXXA Unspecified fall, initial encounter: Secondary | ICD-10-CM

## 2023-10-19 DIAGNOSIS — I10 Essential (primary) hypertension: Secondary | ICD-10-CM | POA: Diagnosis not present

## 2023-10-19 DIAGNOSIS — S2241XA Multiple fractures of ribs, right side, initial encounter for closed fracture: Secondary | ICD-10-CM | POA: Diagnosis not present

## 2023-10-19 DIAGNOSIS — Y92009 Unspecified place in unspecified non-institutional (private) residence as the place of occurrence of the external cause: Secondary | ICD-10-CM

## 2023-10-19 DIAGNOSIS — R531 Weakness: Secondary | ICD-10-CM | POA: Diagnosis not present

## 2023-10-19 MED ORDER — PANTOPRAZOLE SODIUM 40 MG PO TBEC
40.0000 mg | DELAYED_RELEASE_TABLET | Freq: Every morning | ORAL | Status: DC
  Administered 2023-10-19 – 2023-10-23 (×5): 40 mg via ORAL
  Filled 2023-10-19 (×5): qty 1

## 2023-10-19 MED ORDER — ENSURE PLUS HIGH PROTEIN PO LIQD
237.0000 mL | Freq: Two times a day (BID) | ORAL | Status: DC
  Administered 2023-10-19 – 2023-10-22 (×5): 237 mL via ORAL

## 2023-10-19 NOTE — Progress Notes (Signed)
 Progress Note   Patient: Lauren Davidson ZOX:[id number] DOB: 1922-04-16 DOA: 10/16/2023     3 DOS: the patient was seen and examined on 10/19/2023   Brief hospital course: ALEXX GIAMBRA is a 89 y.o. female with medical history significant of HFpEF, hypertension, Sjogren's syndrome, and chronic back pain who presents to the ED after a fall from her bed. She had right rib fractures admitted to [facility] service for pain control, therapy and safe dc plan.  Assessment and Plan: S/p mechanical fall- Nondisplaced Rib fxs R fourth and sixth ribs- Encourage incentive spirometry, flutter valve and pain control.  Out of bed to chair. PT/ OT advised SNF for short term. Case manager working on placement.   Suspected old sternal fracture- This was noted on the CT scan of chest.   Continue pain control. No intervention at this time.   H/o multiple compression fractures of T and L spine Osteopenia s/p kyphoplasty and currently not symptomatic from this. Continue calcium  and vitamin D .   HTN BP better, continue amlodipine     H/o Iron  deficiency  Hb normal. Iron  panel reviewed, iron  level good.    Out of bed to chair. Incentive spirometry. Nursing supportive care. Fall, aspiration precautions. Diet:  Diet Orders (From admission, onward)     Start     Ordered   10/16/23 1908  Diet regular Room service appropriate? Yes; Fluid consistency: Thin  Diet effective now       Question Answer Comment  Room service appropriate? Yes   Fluid consistency: Thin      10/16/23 1911           DVT prophylaxis: enoxaparin  (LOVENOX ) injection 40 mg Start: 10/17/23 1000  Level of care: Med-Surg   Code Status: Limited: Do not attempt resuscitation (DNR) -DNR-LIMITED -Do Not Intubate/DNI   Subjective: Patient is seen and examined today morning. She is lying in bed, restless. Has pleuritic chest discomfort. Eating poor.  Physical Exam: Vitals:   10/18/23 1240 10/18/23 1953 10/19/23 0449 10/19/23 1349   BP: (!) 115/56 (!) 126/59 (!) 139/59 135/66  Pulse: 64 71 63 63  Resp: 18 16 16 18   Temp: 97.7 F (36.5 C) 98.5 F (36.9 C) 98.3 F (36.8 C) (!) 97.5 F (36.4 C)  TempSrc: Oral Oral Oral   SpO2: 93% 91% 92% 95%  Weight:      Height:        General - Elderly Caucasian female, mild restless due to pain. HEENT - PERRLA, EOMI, atraumatic head, hard of hearing Lung - Clear, basal rales, no rhonchi, wheezes. Heart - S1, S2 heard, no murmurs, rubs, trace pedal edema. Abdomen - Soft, non tender, bowel sounds good Neuro - Alert, awake and oriented x 3, non focal exam. Skin - Warm and dry.  Data Reviewed:      Latest Ref Rng & Units 10/17/2023    4:11 AM 10/16/2023    4:28 PM 10/16/2023    4:26 PM  CBC  WBC 4.0 - 10.5 K/uL 5.6  6.7    Hemoglobin 12.0 - 15.0 g/dL 81.1  91.4  78.2   Hematocrit 36.0 - 46.0 % 38.0  39.5  39.0   Platelets 150 - 400 K/uL 147  149        Latest Ref Rng & Units 10/17/2023    4:11 AM 10/16/2023    4:26 PM 06/20/2023    2:28 PM  BMP  Glucose 70 - 99 mg/dL 95  956  213   BUN 8 -  23 mg/dL 16  18  23    Creatinine 0.44 - 1.00 mg/dL 8.29  5.62  1.30   Sodium 135 - 145 mmol/L 133  136  136   Potassium 3.5 - 5.1 mmol/L 4.1  4.5  4.1   Chloride 98 - 111 mmol/L 102  101  96   CO2 22 - 32 mmol/L 23   27   Calcium  8.9 - 10.3 mg/dL 8.7   9.3    No results found.   Family Communication: Discussed with patient, she understand and agree. All questions answered.  Disposition: Status is: Inpatient Remains inpatient appropriate because: pain control, therapy eval for safe dc plan  Planned Discharge Destination: Skilled nursing facility     Time spent: 38 min    Author: Aisha Hove, MD 10/19/2023 2:23 PM Secure chat 7am to 7pm For on call review [email or web address].

## 2023-10-19 NOTE — Plan of Care (Signed)

## 2023-10-20 DIAGNOSIS — S2241XA Multiple fractures of ribs, right side, initial encounter for closed fracture: Secondary | ICD-10-CM | POA: Diagnosis not present

## 2023-10-20 LAB — BASIC METABOLIC PANEL WITH GFR
Anion gap: 9 (ref 5–15)
BUN: 23 mg/dL (ref 8–23)
CO2: 25 mmol/L (ref 22–32)
Calcium: 9.1 mg/dL (ref 8.9–10.3)
Chloride: 101 mmol/L (ref 98–111)
Creatinine, Ser: 0.77 mg/dL (ref 0.44–1.00)
GFR, Estimated: >60 mL/min (ref >60)
Glucose, Bld: 112 mg/dL — ABNORMAL HIGH (ref 70–99)
Potassium: 4.4 mmol/L (ref 3.5–5.1)
Sodium: 135 mmol/L (ref 135–145)

## 2023-10-20 MED ORDER — OXYCODONE HCL 5 MG PO CAPS: 5.0000 mg | ORAL_CAPSULE | ORAL | 0 refills | Status: DC | PRN

## 2023-10-20 NOTE — TOC Progression Note (Addendum)
 Transition of Care [facility]) - Progression Note    Patient Details  Name: Lauren Davidson MRN: [id number] Date of Birth: [birth year]  [facility]) CM/SW Contact  Kathryn Parish, RN Phone Number: 10/20/2023, 9:48 AM  Clinical Narrative:    CM called patients son Hinton Luis. Received approval to start SNF work-up. Waiting on bed offers 11:00 AM Choice List given.  4:02 PM Family have chosen [facility]. Raina Bunting auth started.  Reference Number # [id number] Status PENDED  Expected Discharge Plan: Assisted Living Barriers to Discharge: Continued Medical Work up  Expected Discharge Plan and Services   Discharge Planning Services: CM Consult Post Acute Care Choice: NA Living arrangements for the past 2 months: Assisted Living Facility                 DME Arranged: N/A DME Agency: NA       HH Arranged: NA HH Agency: NA         Social Determinants of Health (SDOH) Interventions SDOH Screenings   Food Insecurity: No Food Insecurity (10/17/2023)  Housing: Patient Declined (10/17/2023)  Transportation Needs: Patient Declined (10/17/2023)  Utilities: Patient Declined (10/17/2023)  Social Connections: Patient Declined (10/17/2023)  Tobacco Use: Low Risk  (10/16/2023)    Readmission Risk Interventions     No data to display

## 2023-10-20 NOTE — Plan of Care (Signed)

## 2023-10-20 NOTE — Progress Notes (Signed)
 PROGRESS NOTE    VY BADLEY  ZDG:[id number] DOB: Aug 29, 1921 DOA: 10/16/2023 PCP: Tena Feeling, MD   Brief Narrative:  Lauren Davidson is a 89 y.o. female with medical history significant of HFpEF, hypertension, Sjogren's syndrome, and chronic back pain who presents to the ED after a fall from her bed. She had right rib fractures admitted to [facility] service for pain control, therapy and safe dc plan.   Assessment & Plan:   Principal Problem:   Rib fractures  S/p mechanical fall- Nondisplaced Rib fxs R fourth and sixth ribs- Encourage incentive spirometry, flutter valve and pain control.  Out of bed to chair. PT/ OT advised SNF for short term. Case manager working on placement.  Waiting for family to pick a place.   Suspected old sternal fracture- This was noted on the CT scan of chest.  However per CT scan, this is likely chronic. Continue pain control. No intervention at this time.   H/o multiple compression fractures of T and L spine Osteopenia s/p kyphoplasty and currently not symptomatic from this. Continue calcium  and vitamin D .   HTN BP better, continue amlodipine     H/o Iron  deficiency  Hb normal. Iron  panel reviewed, iron  level good.    Out of bed to chair. Incentive spirometry. Nursing supportive care. Fall, aspiration precautions.  DVT prophylaxis: enoxaparin  (LOVENOX ) injection 40 mg Start: 10/17/23 1000   Code Status: Limited: Do not attempt resuscitation (DNR) -DNR-LIMITED -Do Not Intubate/DNI   Family Communication:  None present at bedside.  Plan of care discussed with patient in length and he/she verbalized understanding and agreed with it.  Status is: Inpatient Remains inpatient appropriate because: Medically stable, pending placement.   Estimated body mass index is 19.97 kg/m as calculated from the following:   Height as of this encounter: 5\' 5"  (1.651 m).   Weight as of this encounter: 54.4 kg.    Nutritional Assessment: Body mass index is  19.97 kg/m.Aaron Aas Seen by dietician.  I agree with the assessment and plan as outlined below: Nutrition Status:        . Skin Assessment: I have examined the patient's skin and I agree with the wound assessment as performed by the wound care RN as outlined below:    Consultants:  None  Procedures:  None  Antimicrobials:  Anti-infectives (From admission, onward)    None         Subjective: Patient seen and examined.  Her major complaint is sternal pain.  Somewhat right rib pain.  No shortness of breath.  Patient is fully alert and oriented and she also told me that she is going to be 89 year old this September 17.  Objective: Vitals:   10/19/23 0449 10/19/23 1349 10/19/23 1952 10/20/23 0443  BP: (!) 139/59 135/66 (!) 145/56 (!) 142/68  Pulse: 63 63 69 62  Resp: 16 18 18 18   Temp: 98.3 F (36.8 C) (!) 97.5 F (36.4 C) 97.8 F (36.6 C) 97.6 F (36.4 C)  TempSrc: Oral Oral Oral Oral  SpO2: 92% 95% 93% 94%  Weight:      Height:        Intake/Output Summary (Last 24 hours) at 10/20/2023 1118 Last data filed at 10/20/2023 0300 Gross per 24 hour  Intake 240 ml  Output 950 ml  Net -710 ml   Filed Weights   10/16/23 1008  Weight: 54.4 kg    Examination:  General exam: Appears calm and comfortable  Respiratory system: Clear to auscultation. Respiratory effort normal.  Point tenderness at the sternal area and right rib cage area Cardiovascular system: S1 & S2 heard, RRR. No JVD, murmurs, rubs, gallops or clicks. No pedal edema. Gastrointestinal system: Abdomen is nondistended, soft and nontender. No organomegaly or masses felt. Normal bowel sounds heard. Central nervous system: Alert and oriented. No focal neurological deficits. Extremities: Symmetric 5 x 5 power. Skin: No rashes, lesions or ulcers Psychiatry: Judgement and insight appear normal. Mood & affect appropriate.    Data Reviewed: I have personally reviewed following labs and imaging  studies  CBC: Recent Labs  Lab 10/16/23 1626 10/16/23 1628 10/17/23 0411  WBC  --  6.7 5.6  NEUTROABS  --  5.3  --   HGB 13.3 13.2 12.6  HCT 39.0 39.5 38.0  MCV  --  98.5 99.2  PLT  --  149* 147*   Basic Metabolic Panel: Recent Labs  Lab 10/16/23 1626 10/17/23 0411  NA 136 133*  K 4.5 4.1  CL 101 102  CO2  --  23  GLUCOSE 113* 95  BUN 18 16  CREATININE 0.60 0.66  CALCIUM   --  8.7*   GFR: Estimated Creatinine Clearance: 32.9 mL/min (by C-G formula based on SCr of 0.66 mg/dL). Liver Function Tests: No results for input(s): "AST", "ALT", "ALKPHOS", "BILITOT", "PROT", "ALBUMIN " in the last 168 hours. No results for input(s): "LIPASE", "AMYLASE" in the last 168 hours. No results for input(s): "AMMONIA" in the last 168 hours. Coagulation Profile: No results for input(s): "INR", "PROTIME" in the last 168 hours. Cardiac Enzymes: No results for input(s): "CKTOTAL", "CKMB", "CKMBINDEX", "TROPONINI" in the last 168 hours. BNP (last 3 results) No results for input(s): "PROBNP" in the last 8760 hours. HbA1C: No results for input(s): "HGBA1C" in the last 72 hours. CBG: No results for input(s): "GLUCAP" in the last 168 hours. Lipid Profile: No results for input(s): "CHOL", "HDL", "LDLCALC", "TRIG", "CHOLHDL", "LDLDIRECT" in the last 72 hours. Thyroid  Function Tests: No results for input(s): "TSH", "T4TOTAL", "FREET4", "T3FREE", "THYROIDAB" in the last 72 hours. Anemia Panel: No results for input(s): "VITAMINB12", "FOLATE", "FERRITIN", "TIBC", "IRON ", "RETICCTPCT" in the last 72 hours. Sepsis Labs: No results for input(s): "PROCALCITON", "LATICACIDVEN" in the last 168 hours.  No results found for this or any previous visit (from the past 240 hours).   Radiology Studies: No results found.  Scheduled Meds:  acetaminophen   650 mg Oral Q6H   amLODipine   2.5 mg Oral Daily   calcium -vitamin D   1 tablet Oral Q breakfast   docusate sodium   100 mg Oral BID   enoxaparin   (LOVENOX ) injection  40 mg Subcutaneous Q24H   feeding supplement  237 mL Oral BID BM   lidocaine   1 patch Transdermal Q24H   multivitamin  1 tablet Oral Daily   pantoprazole   40 mg Oral q morning   artificial tears  1 drop Both Eyes QID   Continuous Infusions:   LOS: 4 days   Modena Andes, MD [facility]  10/20/2023, 11:18 AM   *Please note that this is a verbal dictation therefore any spelling or grammatical errors are due to the "Dragon [employer] One" system interpretation.  Please page via Amion and do not message via secure chat for urgent patient care matters. Secure chat can be used for non urgent patient care matters.  How to contact the [facility] Attending or Consulting provider 7A - 7P or covering provider during after hours 7P -7A, for this patient?  Check the care team in [facility] and look for a)  attending/consulting [facility] provider listed and b) the [facility] team listed. Page or secure chat 7A-7P. Log into [email or web address] and use [place]'s universal password to access. If you do not have the password, please contact the hospital operator. Locate the [facility] provider you are looking for under [facility] and page to a number that you can be directly reached. If you still have difficulty reaching the provider, please page the [facility] (Director on Call) for the Hospitalists listed on amion for assistance.

## 2023-10-20 NOTE — Progress Notes (Signed)
 Occupational Therapy Treatment Patient Details Name: Lauren Davidson MRN: [id number] DOB: 01-Dec-1921 Today's Date: 10/20/2023   History of present illness 89 y.o. female who presents to the ED from [facility] after a fall from her bed. Dx of R 4-6 rib fractures, chronic sternal fracture (from compressions per facility). Pt with medical history significant of HFpEF, hypertension, Sjogren's syndrome, and chronic back pain, Kyphoplasty Feb 2024 and May 2024, L IM femoral nail December 2024.   OT comments  Patient seen for skilled OT session this afternoon. Patient just transferring back to bed with nursing upon OT arrival and had been sitting in recliner since am. OT addressed BADL's and activity tolerance as outined below. Pain in sternum monitored and limits duration of each tasks. Patient continues to require Acute hospital based OT services to continue to progress. Patient will benefit from continued inpatient follow up therapy, <3 hours/day.        If plan is discharge home, recommend the following:  Two people to help with walking and/or transfers;A lot of help with bathing/dressing/bathroom;Direct supervision/assist for medications management;Assistance with cooking/housework;Direct supervision/assist for financial management;Assist for transportation;Help with stairs or ramp for entrance;Supervision due to cognitive status   Equipment Recommendations  None recommended by OT       Precautions / Restrictions Precautions Precautions: Fall Recall of Precautions/Restrictions: Intact Restrictions Weight Bearing Restrictions Per Provider Order: No       Mobility Bed Mobility Overal bed mobility: Needs Assistance Bed Mobility: Rolling, Sit to Supine Rolling: Contact guard assist, Used rails     Sit to supine: Contact guard assist, HOB elevated, Used rails   General bed mobility comments: recliner to bed    Transfers Overall transfer level: Needs assistance Equipment used:  Rolling walker (2 wheels) Transfers: Sit to/from Stand, Bed to chair/wheelchair/BSC Sit to Stand: Min assist     Step pivot transfers: Min assist, +2 safety/equipment     General transfer comment: reliner to bed following prolonged sittng     Balance Overall balance assessment: Needs assistance, History of Falls Sitting-balance support: Feet supported, No upper extremity supported Sitting balance-Leahy Scale: Fair     Standing balance support: Bilateral upper extremity supported, During functional activity, Reliant on assistive device for balance Standing balance-Leahy Scale: Poor                             ADL either performed or assessed with clinical judgement   ADL Overall ADL's : Needs assistance/impaired     Grooming: Wash/dry hands;Wash/dry face;Oral care;Contact guard assist;Sitting                               Functional mobility during ADLs: Minimal assistance;+2 for safety/equipment;Rolling walker (2 wheels) General ADL Comments: progression of overall activity tolerance and breathing integration within light ADL's and mobility    Extremity/Trunk Assessment Upper Extremity Assessment Upper Extremity Assessment: Generalized weakness   Lower Extremity Assessment Lower Extremity Assessment: Generalized weakness                 Communication Communication Communication: Impaired Factors Affecting Communication: Hearing impaired   Cognition Arousal: Alert Behavior During Therapy: WFL for tasks assessed/performed Cognition: No apparent impairments             OT - Cognition Comments: excellent historian                 Following commands: Intact  Cueing   Cueing Techniques: Verbal cues, Gestural cues  Exercises Exercises: Other exercises (balloon target toss with 20 reps x 4 intervals B UE's with rests and cues for breathing in sitting)       General Comments flutter and IS as well as simple balloon toss  and light ADL's with breathing integration with 98% SpO2 on RA    Pertinent Vitals/ Pain       Pain Assessment Pain Assessment: 0-10 Pain Score: 6  Pain Location: sternum with movement Pain Descriptors / Indicators: Sore, Discomfort, Grimacing, Guarding Pain Intervention(s): Limited activity within patient's tolerance, Monitored during session, Repositioned, Relaxation   Frequency  Min 2X/week        Progress Toward Goals  OT Goals(current goals can now be found in the care plan section)  Progress towards OT goals: Progressing toward goals  Acute Rehab OT Goals OT Goal Formulation: With patient Time For Goal Achievement: 10/31/23 Potential to Achieve Goals: Good ADL Goals Pt Will Perform Grooming: sitting;with set-up Pt Will Perform Upper Body Dressing: with contact guard assist;sitting Pt Will Perform Lower Body Dressing: with contact guard assist;sit to/from stand;sitting/lateral leans Pt Will Transfer to Toilet: with min assist;bedside commode;ambulating;grab bars Pt Will Perform Toileting - Clothing Manipulation and hygiene: with min assist;sitting/lateral leans;sit to/from stand   AM-PAC OT "6 Clicks" Daily Activity     Outcome Measure   Help from another person eating meals?: None Help from another person taking care of personal grooming?: A Little Help from another person toileting, which includes using toliet, bedpan, or urinal?: A Lot Help from another person bathing (including washing, rinsing, drying)?: A Lot Help from another person to put on and taking off regular upper body clothing?: A Lot Help from another person to put on and taking off regular lower body clothing?: A Lot 6 Click Score: 15    End of Session Equipment Utilized During Treatment: Gait belt;Rolling walker (2 wheels)  OT Visit Diagnosis: Unsteadiness on feet (R26.81);Muscle weakness (generalized) (M62.81);History of falling (Z91.81);Other abnormalities of gait and mobility  (R26.89);Pain Pain - part of body:  (chest)   Activity Tolerance Patient limited by pain   Patient Left in bed;with call bell/phone within reach;with bed alarm set   Nurse Communication Mobility status        Time: 1610-9604 OT Time Calculation (min): 20 min  Charges: OT General Charges $OT Visit: 1 Visit OT Treatments $Therapeutic Activity: 8-22 mins  Logyn Kendrick OT/L Acute Rehabilitation Department  [phone]  10/20/2023, 4:01 PM

## 2023-10-20 NOTE — Progress Notes (Addendum)
 Physical Therapy Treatment Patient Details Name: Lauren Davidson MRN: [id number] DOB: 03/23/1922 Today's Date: 10/20/2023   History of Present Illness 89 y.o. female who presents to the ED from [facility] after a fall from her bed. Dx of R 4-6 rib fractures, chronic sternal fracture (from compressions per facility). Pt with medical history significant of HFpEF, hypertension, Sjogren's syndrome, and chronic back pain, Kyphoplasty Feb 2024 and May 2024, L IM femoral nail December 2024.    PT Comments  Pt up in recliner, she reports feeling sternal pain (for which she received tylenol  prior to PT and has a lidoderm  patch) and generalized weakness. Min assist for sit to stand, pt reported feeling "like I'm going to pass out" in standing, she required min assist for standing balance 2* posterior lean. Assisted pt back to recliner where pt performed BLE strengthening exercises. Pt not able to stand long enough to obtain orthostatic vitals. SHe puts forth good effort.     If plan is discharge home, recommend the following: A little help with walking and/or transfers;A little help with bathing/dressing/bathroom;Assistance with cooking/housework;Assist for transportation;Help with stairs or ramp for entrance   Can travel by private vehicle     Yes  Equipment Recommendations  None recommended by PT    Recommendations for Other Services       Precautions / Restrictions Precautions Precautions: Fall Recall of Precautions/Restrictions: Intact Restrictions Weight Bearing Restrictions Per Provider Order: No     Mobility  Bed Mobility               General bed mobility comments: up in recliner    Transfers Overall transfer level: Needs assistance Equipment used: Rolling walker (2 wheels) Transfers: Sit to/from Stand Sit to Stand: Min assist           General transfer comment: assist to power up, VCs hand placement, pt stood ~35 seconds with RW, while standing pt reported feeling  "like I'm going to pass out" so was assisted back to recliner. MIn A for standing balance 2* posterior lean    Ambulation/Gait                   Stairs             Wheelchair Mobility     Tilt Bed    Modified Rankin (Stroke Patients Only)       Balance Overall balance assessment: Needs assistance, History of Falls Sitting-balance support: Feet supported, No upper extremity supported Sitting balance-Leahy Scale: Fair     Standing balance support: Bilateral upper extremity supported, During functional activity, Reliant on assistive device for balance Standing balance-Leahy Scale: Poor                              Communication Communication Communication: Impaired Factors Affecting Communication: Hearing impaired  Cognition Arousal: Alert Behavior During Therapy: WFL for tasks assessed/performed   PT - Cognitive impairments: No apparent impairments                         Following commands: Intact      Cueing Cueing Techniques: Verbal cues, Gestural cues  Exercises General Exercises - Lower Extremity Ankle Circles/Pumps: AROM, 10 reps, Seated, Both Quad Sets: AROM, Both, 5 reps, Supine Long Arc Quad: AROM, Both, 10 reps, Seated Heel Slides: AROM, Both, 10 reps, Supine    General Comments        Pertinent  Vitals/Pain Pain Assessment Pain Score: 7  Pain Location: sternum with movement Pain Descriptors / Indicators: Sore, Discomfort, Grimacing, Guarding Pain Intervention(s): Limited activity within patient's tolerance, Monitored during session, Premedicated before session, Repositioned    Home Living                          Prior Function            PT Goals (current goals can now be found in the care plan section) Acute Rehab PT Goals Patient Stated Goal: to get stronger PT Goal Formulation: With patient Time For Goal Achievement: 10/31/23 Potential to Achieve Goals: Good Progress towards PT goals:  Progressing toward goals    Frequency    Min 2X/week      PT Plan      Co-evaluation              AM-PAC PT "6 Clicks" Mobility   Outcome Measure  Help needed turning from your back to your side while in a flat bed without using bedrails?: A Little Help needed moving from lying on your back to sitting on the side of a flat bed without using bedrails?: A Little Help needed moving to and from a bed to a chair (including a wheelchair)?: A Little Help needed standing up from a chair using your arms (e.g., wheelchair or bedside chair)?: A Little Help needed to walk in hospital room?: A Lot Help needed climbing 3-5 steps with a railing? : Total 6 Click Score: 15    End of Session Equipment Utilized During Treatment: Gait belt Activity Tolerance: Patient limited by pain Patient left: with chair alarm set;in chair;with call bell/phone within reach Nurse Communication: Mobility status PT Visit Diagnosis: Difficulty in walking, not elsewhere classified (R26.2);Pain;History of falling (Z91.81)     Time: 6045-4098 PT Time Calculation (min) (ACUTE ONLY): 22 min  Charges:    $Therapeutic Activity: 8-22 mins PT General Charges $$ ACUTE PT VISIT: 1 Visit                     Daymon Evans PT 10/20/2023  Acute Rehabilitation Services  Office [phone]

## 2023-10-21 DIAGNOSIS — S2241XA Multiple fractures of ribs, right side, initial encounter for closed fracture: Secondary | ICD-10-CM | POA: Diagnosis not present

## 2023-10-21 NOTE — Plan of Care (Signed)

## 2023-10-21 NOTE — Progress Notes (Signed)
 PROGRESS NOTE    Lauren Davidson  ZOX:[id number] DOB: Sep 12, 1921 DOA: 10/16/2023 PCP: Tena Feeling, MD   Brief Narrative:  Lauren Davidson is a 89 y.o. female with medical history significant of HFpEF, hypertension, Sjogren's syndrome, and chronic back pain who presents to the ED after a fall from her bed. She had right rib fractures admitted to [facility] service for pain control, therapy and safe dc plan.   Assessment & Plan:   Principal Problem:   Rib fractures  S/p mechanical fall- Nondisplaced Rib fxs R fourth and sixth ribs- Encourage incentive spirometry, flutter valve and pain control.  Out of bed to chair. PT/ OT advised SNF for short term. Case manager working on placement.  Waiting for family to pick a place.   Suspected old sternal fracture- This was noted on the CT scan of chest.  However per CT scan, this is likely chronic. Continue pain control. No intervention at this time.   H/o multiple compression fractures of T and L spine Osteopenia s/p kyphoplasty and currently not symptomatic from this. Continue calcium  and vitamin D .   HTN BP better, continue amlodipine     H/o Iron  deficiency  Hb normal. Iron  panel reviewed, iron  level good.    Out of bed to chair. Incentive spirometry. Nursing supportive care. Fall, aspiration precautions.  DVT prophylaxis: enoxaparin  (LOVENOX ) injection 40 mg Start: 10/17/23 1000   Code Status: Limited: Do not attempt resuscitation (DNR) -DNR-LIMITED -Do Not Intubate/DNI   Family Communication:  None present at bedside.  Plan of care discussed with patient in length and he/she verbalized understanding and agreed with it.  Status is: Inpatient Remains inpatient appropriate because: Medically stable, pending placement.   Estimated body mass index is 19.97 kg/m as calculated from the following:   Height as of this encounter: 5\' 5"  (1.651 m).   Weight as of this encounter: 54.4 kg.    Nutritional Assessment: Body mass index is  19.97 kg/m.Aaron Aas Seen by dietician.  I agree with the assessment and plan as outlined below: Nutrition Status:        . Skin Assessment: I have examined the patient's skin and I agree with the wound assessment as performed by the wound care RN as outlined below:    Consultants:  None  Procedures:  None  Antimicrobials:  Anti-infectives (From admission, onward)    None         Subjective: Patient seen and examined.  No complaints.  Objective: Vitals:   10/20/23 0443 10/20/23 1331 10/20/23 2007 10/21/23 0437  BP: (!) 142/68 118/63 (!) 134/58 (!) 144/60  Pulse: 62 (!) 58 62 (!) 59  Resp: 18 16 17 17   Temp: 97.6 F (36.4 C) 97.8 F (36.6 C) 97.6 F (36.4 C) 97.6 F (36.4 C)  TempSrc: Oral Oral Oral Axillary  SpO2: 94% 97% 96% 94%  Weight:      Height:        Intake/Output Summary (Last 24 hours) at 10/21/2023 1305 Last data filed at 10/21/2023 0452 Gross per 24 hour  Intake --  Output 1250 ml  Net -1250 ml   Filed Weights   10/16/23 1008  Weight: 54.4 kg    Examination:  General exam: Appears calm and comfortable  Respiratory system: Clear to auscultation. Respiratory effort normal.  Point tenderness at the sternum. Cardiovascular system: S1 & S2 heard, RRR. No JVD, murmurs, rubs, gallops or clicks. No pedal edema. Gastrointestinal system: Abdomen is nondistended, soft and nontender. No organomegaly or masses felt.  Normal bowel sounds heard. Central nervous system: Alert and oriented. No focal neurological deficits. Extremities: Symmetric 5 x 5 power. Skin: No rashes, lesions or ulcers.  Psychiatry: Judgement and insight appear normal. Mood & affect appropriate.    Data Reviewed: I have personally reviewed following labs and imaging studies  CBC: Recent Labs  Lab 10/16/23 1626 10/16/23 1628 10/17/23 0411  WBC  --  6.7 5.6  NEUTROABS  --  5.3  --   HGB 13.3 13.2 12.6  HCT 39.0 39.5 38.0  MCV  --  98.5 99.2  PLT  --  149* 147*   Basic Metabolic  Panel: Recent Labs  Lab 10/16/23 1626 10/17/23 0411 10/20/23 1159  NA 136 133* 135  K 4.5 4.1 4.4  CL 101 102 101  CO2  --  23 25  GLUCOSE 113* 95 112*  BUN 18 16 23   CREATININE 0.60 0.66 0.77  CALCIUM   --  8.7* 9.1   GFR: Estimated Creatinine Clearance: 32.9 mL/min (by C-G formula based on SCr of 0.77 mg/dL). Liver Function Tests: No results for input(s): "AST", "ALT", "ALKPHOS", "BILITOT", "PROT", "ALBUMIN " in the last 168 hours. No results for input(s): "LIPASE", "AMYLASE" in the last 168 hours. No results for input(s): "AMMONIA" in the last 168 hours. Coagulation Profile: No results for input(s): "INR", "PROTIME" in the last 168 hours. Cardiac Enzymes: No results for input(s): "CKTOTAL", "CKMB", "CKMBINDEX", "TROPONINI" in the last 168 hours. BNP (last 3 results) No results for input(s): "PROBNP" in the last 8760 hours. HbA1C: No results for input(s): "HGBA1C" in the last 72 hours. CBG: No results for input(s): "GLUCAP" in the last 168 hours. Lipid Profile: No results for input(s): "CHOL", "HDL", "LDLCALC", "TRIG", "CHOLHDL", "LDLDIRECT" in the last 72 hours. Thyroid  Function Tests: No results for input(s): "TSH", "T4TOTAL", "FREET4", "T3FREE", "THYROIDAB" in the last 72 hours. Anemia Panel: No results for input(s): "VITAMINB12", "FOLATE", "FERRITIN", "TIBC", "IRON ", "RETICCTPCT" in the last 72 hours. Sepsis Labs: No results for input(s): "PROCALCITON", "LATICACIDVEN" in the last 168 hours.  No results found for this or any previous visit (from the past 240 hours).   Radiology Studies: No results found.  Scheduled Meds:  acetaminophen   650 mg Oral Q6H   amLODipine   2.5 mg Oral Daily   calcium -vitamin D   1 tablet Oral Q breakfast   docusate sodium   100 mg Oral BID   enoxaparin  (LOVENOX ) injection  40 mg Subcutaneous Q24H   feeding supplement  237 mL Oral BID BM   lidocaine   1 patch Transdermal Q24H   multivitamin  1 tablet Oral Daily   pantoprazole   40 mg Oral  q morning   artificial tears  1 drop Both Eyes QID   Continuous Infusions:   LOS: 5 days   Modena Andes, MD [facility]  10/21/2023, 1:05 PM   *Please note that this is a verbal dictation therefore any spelling or grammatical errors are due to the "Dragon Medical One" system interpretation.  Please page via Amion and do not message via secure chat for urgent patient care matters. Secure chat can be used for non urgent patient care matters.  How to contact the [facility] Attending or Consulting provider 7A - 7P or covering provider during after hours 7P -7A, for this patient?  Check the care team in [facility] and look for a) attending/consulting TR[facility] provider listed and b) the [facility] team listed. Page or secure chat 7A-7P. Log into [email or web address] and use 's universal password to access. If you do not  have the password, please contact the hospital operator. Locate the [facility] provider you are looking for under [facility] and page to a number that you can be directly reached. If you still have difficulty reaching the provider, please page the [facility] (Director on Call) for the Hospitalists listed on amion for assistance.

## 2023-10-21 NOTE — Plan of Care (Signed)
  Problem: Education: Goal: Knowledge of General Education information will improve Description: Including pain rating scale, medication(s)/side effects and non-pharmacologic comfort measures Outcome: Progressing   Problem: Health Behavior/Discharge Planning: Goal: Ability to manage health-related needs will improve Outcome: Progressing   Problem: Clinical Measurements: Goal: Ability to maintain clinical measurements within normal limits will improve Outcome: Progressing Goal: Will remain free from infection Outcome: Progressing Goal: Diagnostic test results will improve Outcome: Progressing Goal: Respiratory complications will improve Outcome: Progressing Goal: Cardiovascular complication will be avoided Outcome: Progressing   Problem: Activity: Goal: Risk for activity intolerance will decrease Outcome: Progressing   Problem: Nutrition: Goal: Adequate nutrition will be maintained Outcome: Progressing   Problem: Elimination: Goal: Will not experience complications related to bowel motility Outcome: Progressing Goal: Will not experience complications related to urinary retention Outcome: Progressing   Problem: Coping: Goal: Level of anxiety will decrease Outcome: Progressing   Problem: Pain Managment: Goal: General experience of comfort will improve and/or be controlled Outcome: Progressing

## 2023-10-22 ENCOUNTER — Encounter (HOSPITAL_COMMUNITY): Payer: Self-pay | Admitting: Student

## 2023-10-22 DIAGNOSIS — W19XXXA Unspecified fall, initial encounter: Secondary | ICD-10-CM | POA: Diagnosis not present

## 2023-10-22 DIAGNOSIS — R54 Age-related physical debility: Secondary | ICD-10-CM

## 2023-10-22 DIAGNOSIS — N182 Chronic kidney disease, stage 2 (mild): Secondary | ICD-10-CM

## 2023-10-22 DIAGNOSIS — Z66 Do not resuscitate: Secondary | ICD-10-CM | POA: Diagnosis not present

## 2023-10-22 DIAGNOSIS — S2241XA Multiple fractures of ribs, right side, initial encounter for closed fracture: Secondary | ICD-10-CM | POA: Diagnosis not present

## 2023-10-22 MED ORDER — OXYCODONE HCL 5 MG PO CAPS: 5.0000 mg | ORAL_CAPSULE | ORAL | 0 refills | Status: DC | PRN

## 2023-10-22 NOTE — Assessment & Plan Note (Signed)
 10-22-2023 likely needs SNF. Able to walk 10 feet today.  10-23-2023 stable. DC to SNF today. Pt lives at ALF prior to admission.

## 2023-10-22 NOTE — TOC Progression Note (Addendum)
 Transition of Care [facility]) - Progression Note    Patient Details  Name: Lauren Davidson MRN: [id number] Date of Birth: [birth year]  Transition of [facility]) CM/SW Contact  Kathryn Parish, RN Phone Number: 10/22/2023, 9:10 AM  Clinical Narrative:    Insurance auth denied. Informed the Dr. Farrel Hones that if he wants to do a peer to peer to [phone] by 12 noon today. Dr. Farrel Hones requested PT see patient prior to noon. PT stated they will work with patient before noon. This will allow Dr. Farrel Hones to make decision to request a peer to peer. 9:54 AM Currently active with [facility] PT. 11:07 AM [employer] requested updated PT note. PT note uploaded to [employer] 12:37 PM Peer to peer completed. Patient approved for SNF. [facility] ([place]) notified, no bed available until tomorrow. 15:00 Auth # - [id number] 7 days 10/20/2023 - 10/28/2023  Expected Discharge Plan: Assisted Living Barriers to Discharge: Continued Medical Work up  Expected Discharge Plan and Services   Discharge Planning Services: CM Consult Post Acute Care Choice: NA Living arrangements for the past 2 months: Assisted Living Facility                 DME Arranged: N/A DME Agency: NA       HH Arranged: NA HH Agency: NA         Social Determinants of Health (SDOH) Interventions SDOH Screenings   Food Insecurity: No Food Insecurity (10/17/2023)  Housing: Patient Declined (10/17/2023)  Transportation Needs: Patient Declined (10/17/2023)  Utilities: Patient Declined (10/17/2023)  Social Connections: Patient Declined (10/17/2023)  Tobacco Use: Low Risk  (10/16/2023)    Readmission Risk Interventions     No data to display

## 2023-10-22 NOTE — Plan of Care (Signed)

## 2023-10-22 NOTE — Progress Notes (Signed)
 Physical Therapy Treatment Patient Details Name: Lauren Davidson MRN: [id number] DOB: 1921/10/19 Today's Date: 10/22/2023   History of Present Illness 89 y.o. female who presents to the ED from [facility] ALF after a fall from her bed. Dx of R 4-6 rib fractures, chronic sternal fracture (from compressions per facility). Pt with medical history significant of HFpEF, hypertension, Sjogren's syndrome, and chronic back pain, Kyphoplasty Feb 2024 and May 2024, L IM femoral nail December 2024.    PT Comments  Pt seen for PT tx with pt agreeable. PT educated pt on hugging pillow for compression to decrease pain but little to no help with pain management. Pt requires mod<>max assist for bed mobility via log rolling & hospital bed features. Pt is able to complete sit<>stand with min assist & ambulate in room with RW x 10 ft x 2 with RW & min assist. Pt requires seated rest breaks between each gait trial, reports feeling SOB but SpO2 > 90% on room air. Pt is making good progress with mobility but would still benefit from post acute rehab <3 hours therapy/day as pt would not have level of care required upon d/c to her ALF (per pt report). Per chart, pt was ambulatory in room with rollator without assistance, propelled w/c to dining hall. At this time, pt requires significant assistance to get OOB even with hospital bed features, min assist for short distance gait with RW, and would likely have great difficulty or be unable to propel w/c with BUE 2/2 sternal pain. Educated pt on recommendation of RW vs rollator at this time. Will continue to follow pt acutely to progress mobility as able.   If plan is discharge home, recommend the following: A little help with walking and/or transfers;A little help with bathing/dressing/bathroom;Assistance with cooking/housework;Assist for transportation;Help with stairs or ramp for entrance   Can travel by private vehicle     Yes  Equipment Recommendations  Rolling walker (2  wheels)    Recommendations for Other Services       Precautions / Restrictions Precautions Precautions: Fall Restrictions Weight Bearing Restrictions Per Provider Order: No     Mobility  Bed Mobility Overal bed mobility: Needs Assistance Bed Mobility: Rolling, Sidelying to Sit Rolling: Mod assist, Used rails (HOB elevated) Sidelying to sit: Mod assist, HOB elevated, Used rails       General bed mobility comments: max multimodal cuing for log rolling, use of pillow for compression to reduce pain with movement; pt requires max assist to fully scoot to sitting EOB    Transfers Overall transfer level: Needs assistance Equipment used: Rolling walker (2 wheels) Transfers: Sit to/from Stand, Bed to chair/wheelchair/BSC Sit to Stand: Min assist   Step pivot transfers: Min assist       General transfer comment: sit<>stand from EOB & recliner with education re: hand placement, bed>recliner on L with RW & min assist.    Ambulation/Gait Ambulation/Gait assistance: Min assist Gait Distance (Feet): 10 Feet (+ 10 ft) Assistive device: Rolling walker (2 wheels) Gait Pattern/deviations: Decreased step length - right, Decreased stride length, Decreased step length - left Gait velocity: decreased         Stairs             Wheelchair Mobility     Tilt Bed    Modified Rankin (Stroke Patients Only)       Balance Overall balance assessment: Needs assistance, History of Falls Sitting-balance support: Feet supported, No upper extremity supported Sitting balance-Leahy Scale: Fair  Standing balance support: Bilateral upper extremity supported, During functional activity, Reliant on assistive device for balance Standing balance-Leahy Scale: Fair                              Hotel manager: Impaired Factors Affecting Communication: Hearing impaired  Cognition Arousal: Alert Behavior During Therapy: WFL for tasks  assessed/performed   PT - Cognitive impairments: No apparent impairments                         Following commands: Intact      Cueing Cueing Techniques: Verbal cues, Gestural cues  Exercises      General Comments General comments (skin integrity, edema, etc.): reviewed use of incentive spirometer & acapella flutter valve with good return demo by pt (pt only able to achieve ~625 mL max on incentive spirometer); pt with c/o SOB but SpO2 98-100%, PT educated pt on pursed lip breathing      Pertinent Vitals/Pain Pain Assessment Pain Assessment: Faces Faces Pain Scale: Hurts even more Pain Location: sternum with movement Pain Descriptors / Indicators: Sore, Discomfort, Grimacing, Guarding Pain Intervention(s): Monitored during session, Limited activity within patient's tolerance    Home Living                          Prior Function            PT Goals (current goals can now be found in the care plan section) Acute Rehab PT Goals Patient Stated Goal: to get stronger PT Goal Formulation: With patient Time For Goal Achievement: 10/31/23 Potential to Achieve Goals: Good Progress towards PT goals: Progressing toward goals    Frequency    Min 2X/week      PT Plan      Co-evaluation              AM-PAC PT "6 Clicks" Mobility   Outcome Measure  Help needed turning from your back to your side while in a flat bed without using bedrails?: A Lot Help needed moving from lying on your back to sitting on the side of a flat bed without using bedrails?: A Lot Help needed moving to and from a bed to a chair (including a wheelchair)?: A Little Help needed standing up from a chair using your arms (e.g., wheelchair or bedside chair)?: A Little Help needed to walk in hospital room?: A Little Help needed climbing 3-5 steps with a railing? : A Lot 6 Click Score: 15    End of Session Equipment Utilized During Treatment: Gait belt Activity Tolerance:  Patient tolerated treatment well Patient left: in chair;with chair alarm set;with call bell/phone within reach Nurse Communication: Mobility status PT Visit Diagnosis: Difficulty in walking, not elsewhere classified (R26.2);Pain;History of falling (Z91.81);Muscle weakness (generalized) (M62.81) Pain - part of body:  (sternum)     Time: 1308-6578 PT Time Calculation (min) (ACUTE ONLY): 26 min  Charges:    $Therapeutic Activity: 23-37 mins PT General Charges $$ ACUTE PT VISIT: 1 Visit                     Emaline Handsome, PT, DPT 10/22/23, 9:45 AM   Lauren Davidson 10/22/2023, 9:42 AM

## 2023-10-22 NOTE — Assessment & Plan Note (Addendum)
 10-22-2023 stable. No using po dilaudid . Will stop it. Continued Scheduled tylenol  only given her advanced age of 6.  10-23-2023 after peer-to-peer call with pt's insurance company yesterday, I was successful in getting her ins authorization for SNF rehab approved. Pt stable for DC to [facility] SNF today.

## 2023-10-22 NOTE — Assessment & Plan Note (Signed)
 10-22-2023 stable on norvasc .  10-23-2023 stable.

## 2023-10-22 NOTE — Assessment & Plan Note (Signed)
 10-22-2023 stable.  10-23-2023 stable. Pt wears hearing aids. She has them in her ears now.

## 2023-10-22 NOTE — Subjective & Objective (Signed)
 Pt seen and examined. Stable. On RA. Pt has ins authorization and bed at [facility]. Called and updated pt's son-in-law Arch Ko at pt's request.

## 2023-10-22 NOTE — Progress Notes (Addendum)
 PROGRESS NOTE    Lauren Davidson  WUJ:[id number] DOB: Feb 25, 1922 DOA: 10/16/2023 PCP: Tena Feeling, MD  Subjective: Pt seen and examined. Pt worked with PT today. Able to walk 10 feet today. 3 days ago, she was only barely able to stand up. Good potential for SNF rehab. Informed that pt's insurance company wants peer-to-peer. I called and scheduled telephone appointment for today.   Hospital Course: HPI: Lauren Davidson is a 89 y.o. female with medical history significant of HFpEF, hypertension, Sjogren's syndrome, and chronic back pain who presents to the ED after a fall from her bed.   Patient states that she was asleep and rolled out of her bed.  She fell on the floor onto her R side.  She was also noted to have some right foot pain.  She states that her wheel chair was near her bed and she likely hit this.  She denies losing consciousness.  S she does have some pain with taking deep breaths.  He denies dysuria, headache, abdominal pain, dyspnea.  Significant Events: Admitted 10/16/2023 for right rib fractures   Admission Labs: UA spg 1.011, negative nitrite, Large LE, WBC 6-10, few bacteria WBC 6.7, HgB 13.2, plt 149 Na 133, K 4.1, CO2 of 23,  BUN 16, Scr 0.66. glu 95 Vit B12 364 Folate 24 B12 364 Ferritin 46 Fe 61, TIBC 312  Admission Imaging Studies: CXR Enlarged cardiopericardial silhouette with calcified aorta. Slight opacity left lung base. Recommend follow-up. Uptake in cement seen at several vertebral levels. Is also multilevel compression deformities in thoracic spine. One level in the lower thoracic spine is progressive compared to available x-ray of May 2024. Please correlate with time course of this compression and if needed further workup with CT or MRI as clinically appropriate. Pelvic XR No acute osseous abnormality.  Diffuse osteopenia. 2. Prior ORIF of a healed left proximal femoral intertrochanteric fracture. 3. Grossly unchanged severe L4 compression  fracture. Right foot XR No acute osseous abnormality.  Diffuse osteopenia. 2. 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 CT head No acute intracranial abnormality or acute traumatic injury identified CT c-spine No acute traumatic injury identified in the cervical spine. 2. Mild chronic T3 compression fracture with endplate sclerosis since January CT chest Possible nondisplaced fractures of the right fourth and sixth ribs. No pneumothorax. 2. Age indeterminate compression deformities in the thoracic spine detailed above. Correlate clinically for point tenderness. 3. Suspected chronic sternal fracture. Correlate with point tenderness. 4. Diffuse osteopenia.  Significant Labs:   Significant Imaging Studies:   Antibiotic Therapy: Anti-infectives (From admission, onward)    None       Procedures:   Consultants:     Assessment and Plan: * Closed fracture of multiple ribs of right side 10-22-2023 stable. No using po dilaudid . Will stop it. Continued Scheduled tylenol  only given her advanced age of 89.  Fall at home, initial encounter 10-22-2023 likely needs SNF. Able to walk 10 feet today.  DNR (do not resuscitate)/DNI(Do Not Intubate) Pt made DNR/DNI on admission.  Frail elderly 10-22-2023 at 89 year old, I think she needs some SNF rehab given her fall and right rib fractures. Peer-to-peer initiated today.  HOH (hard of hearing) 10-22-2023 stable.  Chronic kidney disease (CKD), stage II (mild) 10-22-2023 stable.  HTN (hypertension) 10-22-2023 stable on norvasc .   DVT prophylaxis: enoxaparin  (LOVENOX ) injection 40 mg Start: 10/17/23 1000    Code Status: Limited: Do not attempt resuscitation (DNR) -DNR-LIMITED -Do Not Intubate/DNI  Family Communication: no family at  bedside Disposition Plan: SNF Reason for continuing need for hospitalization: medically stable for DC  Objective: Vitals:   10/21/23 1603 10/21/23 1907 10/21/23 1920 10/22/23 0412  BP:   (!) 130/58  (!) 145/61  Pulse:   (!) 56 (!) 56  Resp:   18 19  Temp:   97.6 F (36.4 C) (!) 97.5 F (36.4 C)  TempSrc:      SpO2:   95% 96%  Weight: 53.7 kg 53.1 kg    Height:        Intake/Output Summary (Last 24 hours) at 10/22/2023 1116 Last data filed at 10/21/2023 2126 Gross per 24 hour  Intake --  Output 150 ml  Net -150 ml   Filed Weights   10/16/23 1008 10/21/23 1603 10/21/23 1907  Weight: 54.4 kg 53.7 kg 53.1 kg    Examination:  Physical Exam Vitals and nursing note reviewed.  Constitutional:      General: She is not in acute distress.    Appearance: She is not toxic-appearing.     Comments: Frail elderly  HENT:     Head: Normocephalic and atraumatic.     Nose: Nose normal.  Eyes:     General: No scleral icterus. Cardiovascular:     Rate and Rhythm: Normal rate and regular rhythm.  Pulmonary:     Effort: Pulmonary effort is normal.     Breath sounds: Normal breath sounds.  Abdominal:     General: Bowel sounds are normal.     Palpations: Abdomen is soft.  Skin:    General: Skin is warm and dry.     Capillary Refill: Capillary refill takes less than 2 seconds.  Neurological:     Mental Status: She is alert.     Comments: Hard of hearing     Data Reviewed: I have personally reviewed following labs and imaging studies  CBC: Recent Labs  Lab 10/16/23 1626 10/16/23 1628 10/17/23 0411  WBC  --  6.7 5.6  NEUTROABS  --  5.3  --   HGB 13.3 13.2 12.6  HCT 39.0 39.5 38.0  MCV  --  98.5 99.2  PLT  --  149* 147*   Basic Metabolic Panel: Recent Labs  Lab 10/16/23 1626 10/17/23 0411 10/20/23 1159  NA 136 133* 135  K 4.5 4.1 4.4  CL 101 102 101  CO2  --  23 25  GLUCOSE 113* 95 112*  BUN 18 16 23   CREATININE 0.60 0.66 0.77  CALCIUM   --  8.7* 9.1   GFR: Estimated Creatinine Clearance: 32.1 mL/min (by C-G formula based on SCr of 0.77 mg/dL).  Scheduled Meds:  acetaminophen   650 mg Oral Q6H   amLODipine   2.5 mg Oral Daily   calcium -vitamin D   1 tablet  Oral Q breakfast   docusate sodium   100 mg Oral BID   enoxaparin  (LOVENOX ) injection  40 mg Subcutaneous Q24H   feeding supplement  237 mL Oral BID BM   lidocaine   1 patch Transdermal Q24H   multivitamin  1 tablet Oral Daily   pantoprazole   40 mg Oral q morning   artificial tears  1 drop Both Eyes QID   Continuous Infusions:   LOS: 6 days   Time spent: 50 minutes  Unk Garb, DO  [facility]  10/22/2023, 11:16 AM

## 2023-10-22 NOTE — Progress Notes (Addendum)
   Started peer-to-peer call at 10:41 AM on 10-22-2023.  Spoke with JulieAnn with [employer]. Member # [id number]. Starting at 10:46 AM.  *update. Discussed with [employer].  Pt has been approved for SNF rehab.  Unk Garb, DO [facility]

## 2023-10-22 NOTE — Assessment & Plan Note (Signed)
 10-22-2023 at 89 year old, I think she needs some SNF rehab given her fall and right rib fractures. Peer-to-peer initiated today.

## 2023-10-22 NOTE — TOC Progression Note (Signed)
 Transition of Care [facility]) - Progression Note    Patient Details  Name: Lauren Davidson MRN: [id number] Date of Birth: [birth year]  Transition of [facility]) CM/SW Contact  Landis Dowdy, Greggory Learn, RN Phone Number:  Clinical Narrative:    Insurance auth still pending   Expected Discharge Plan: Assisted Living Barriers to Discharge: Continued Medical Work up  Expected Discharge Plan and Services   Discharge Planning Services: CM Consult Post Acute Care Choice: NA Living arrangements for the past 2 months: Assisted Living Facility                 DME Arranged: N/A DME Agency: NA       HH Arranged: NA HH Agency: NA         Social Determinants of Health (SDOH) Interventions SDOH Screenings   Food Insecurity: No Food Insecurity (10/17/2023)  Housing: Patient Declined (10/17/2023)  Transportation Needs: Patient Declined (10/17/2023)  Utilities: Patient Declined (10/17/2023)  Social Connections: Patient Declined (10/17/2023)  Tobacco Use: Low Risk  (10/16/2023)    Readmission Risk Interventions     No data to display

## 2023-10-22 NOTE — Assessment & Plan Note (Signed)
 10-22-2023 stable.  10-23-2023 stable. Scr 0.77

## 2023-10-22 NOTE — Assessment & Plan Note (Signed)
 Pt made DNR/DNI on admission.

## 2023-10-23 DIAGNOSIS — M8580 Other specified disorders of bone density and structure, unspecified site: Secondary | ICD-10-CM | POA: Diagnosis not present

## 2023-10-23 DIAGNOSIS — Z66 Do not resuscitate: Secondary | ICD-10-CM | POA: Diagnosis not present

## 2023-10-23 DIAGNOSIS — S2241XD Multiple fractures of ribs, right side, subsequent encounter for fracture with routine healing: Secondary | ICD-10-CM | POA: Diagnosis not present

## 2023-10-23 DIAGNOSIS — I10 Essential (primary) hypertension: Secondary | ICD-10-CM | POA: Diagnosis not present

## 2023-10-23 DIAGNOSIS — H9190 Unspecified hearing loss, unspecified ear: Secondary | ICD-10-CM | POA: Diagnosis not present

## 2023-10-23 DIAGNOSIS — S2241XA Multiple fractures of ribs, right side, initial encounter for closed fracture: Secondary | ICD-10-CM | POA: Diagnosis not present

## 2023-10-23 DIAGNOSIS — N182 Chronic kidney disease, stage 2 (mild): Secondary | ICD-10-CM | POA: Diagnosis not present

## 2023-10-23 DIAGNOSIS — W19XXXA Unspecified fall, initial encounter: Secondary | ICD-10-CM | POA: Diagnosis not present

## 2023-10-23 NOTE — Plan of Care (Signed)

## 2023-10-23 NOTE — Discharge Summary (Signed)
 [facility] Physician Discharge Summary   Patient name: Lauren Davidson  Admit date:     10/16/2023  Discharge date: 10/23/2023  Attending Physician: Joette Mustard [[id number]]  Discharge Physician: Unk Garb   PCP: Tena Feeling, MD  Admitted From: [facility]  Disposition:  [facility]  Recommendations for Outpatient Follow-up:  Follow up with PCP in 1-2 weeks  Home Health:No Equipment/Devices: None    Discharge Condition:Stable CODE STATUS:DNR/DNI Diet recommendation: Heart Healthy Fluid Restriction: None  Hospital Summary: HPI: Lauren Davidson is a 89 y.o. female with medical history significant of HFpEF, hypertension, Sjogren's syndrome, and chronic back pain who presents to the ED after a fall from her bed.   Patient states that she was asleep and rolled out of her bed.  She fell on the floor onto her R side.  She was also noted to have some right foot pain.  She states that her wheel chair was near her bed and she likely hit this.  She denies losing consciousness.  S she does have some pain with taking deep breaths.  He denies dysuria, headache, abdominal pain, dyspnea.  Significant Events: Admitted 10/16/2023 for right rib fractures   Admission Labs: UA spg 1.011, negative nitrite, Large LE, WBC 6-10, few bacteria WBC 6.7, HgB 13.2, plt 149 Na 133, K 4.1, CO2 of 23,  BUN 16, Scr 0.66. glu 95 Vit B12 364 Folate 24 B12 364 Ferritin 46 Fe 61, TIBC 312  Admission Imaging Studies: CXR Enlarged cardiopericardial silhouette with calcified aorta. Slight opacity left lung base. Recommend follow-up. Uptake in cement seen at several vertebral levels. Is also multilevel compression deformities in thoracic spine. One level in the lower thoracic spine is progressive compared to available x-ray of May 2024. Please correlate with time course of this compression and if needed further workup with CT or MRI as clinically appropriate. Pelvic XR No acute osseous  abnormality.  Diffuse osteopenia. 2. Prior ORIF of a healed left proximal femoral intertrochanteric fracture. 3. Grossly unchanged severe L4 compression fracture. Right foot XR No acute osseous abnormality.  Diffuse osteopenia. 2. 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 CT head No acute intracranial abnormality or acute traumatic injury identified CT c-spine No acute traumatic injury identified in the cervical spine. 2. Mild chronic T3 compression fracture with endplate sclerosis since January CT chest Possible nondisplaced fractures of the right fourth and sixth ribs. No pneumothorax. 2. Age indeterminate compression deformities in the thoracic spine detailed above. Correlate clinically for point tenderness. 3. Suspected chronic sternal fracture. Correlate with point tenderness. 4. Diffuse osteopenia.  Significant Labs:   Significant Imaging Studies:   Antibiotic Therapy: Anti-infectives (From admission, onward)    None       Procedures:   Consultants:     Hospital Course by Problem: * Closed fracture of multiple ribs of right side 10-22-2023 stable. No using po dilaudid . Will stop it. Continued Scheduled tylenol  only given her advanced age of 70.  10-23-2023 after peer-to-peer call with pt's insurance company yesterday, I was successful in getting her ins authorization for SNF rehab approved. Pt stable for DC to [facility] today.  Fall at home, initial encounter 10-22-2023 likely needs SNF. Able to walk 10 feet today.  10-23-2023 stable. DC to SNF today. Pt lives at ALF prior to admission.  DNR (do not resuscitate)/DNI(Do Not Intubate) Pt made DNR/DNI on admission.    Frail elderly 10-22-2023 at 89 year old, I think she needs some SNF rehab given her fall and  right rib fractures. Peer-to-peer initiated today.  HOH (hard of hearing) 10-22-2023 stable.  10-23-2023 stable. Pt wears hearing aids. She has them in her ears now.  Chronic kidney disease (CKD),  stage II (mild) 10-22-2023 stable.  10-23-2023 stable. Scr 0.77  HTN (hypertension) 10-22-2023 stable on norvasc .  10-23-2023 stable.    Discharge Diagnoses:  Principal Problem:   Closed fracture of multiple ribs of right side Active Problems:   Fall at home, initial encounter   HTN (hypertension)   Chronic kidney disease (CKD), stage II (mild)   HOH (hard of hearing)   Frail elderly   DNR (do not resuscitate)/DNI(Do Not Intubate)   Discharge Instructions  Discharge Instructions     Call MD for:  difficulty breathing, headache or visual disturbances   Complete by: As directed    Call MD for:  extreme fatigue   Complete by: As directed    Call MD for:  hives   Complete by: As directed    Call MD for:  persistant dizziness or light-headedness   Complete by: As directed    Call MD for:  persistant nausea and vomiting   Complete by: As directed    Call MD for:  redness, tenderness, or signs of infection (pain, swelling, redness, odor or green/yellow discharge around incision site)   Complete by: As directed    Call MD for:  severe uncontrolled pain   Complete by: As directed    Call MD for:  temperature >100.4   Complete by: As directed    Diet general   Complete by: As directed    Discharge instructions   Complete by: As directed    1. Follow up with your primary care provider in 1-2 weeks following discharge from hospital.   Increase activity slowly   Complete by: As directed       Allergies as of 10/23/2023       Reactions   Statins Other (See Comments)   Muscle weakness and pain   Buprenorphine Hcl Other (See Comments)   Patient questioned this entry   Buspirone Other (See Comments)   Weakness   Lexapro  [escitalopram ] Other (See Comments)   "Muscle problems"   Meperidine Other (See Comments)   Demerol- questionable allergy- reaction not recalled   Morphine And Codeine Rash        Medication List     STOP taking these medications    ferrous  gluconate 324 MG tablet Commonly known as: FERGON       TAKE these medications    acetaminophen  500 MG tablet Commonly known as: TYLENOL  Take 500 mg by mouth every 6 (six) hours as needed (for pain).   amLODipine  5 MG tablet Commonly known as: NORVASC  Take 1 tablet (5 mg total) by mouth daily.   CALCIUM +D3 PO Take 1 tablet by mouth daily with breakfast.   docusate sodium  100 MG capsule Commonly known as: COLACE Take 1 capsule (100 mg total) by mouth 2 (two) times daily.   ibuprofen  200 MG tablet Commonly known as: ADVIL  Take 200 mg by mouth every 6 (six) hours as needed for moderate pain (pain score 4-6) or mild pain (pain score 1-3).   oxycodone  5 MG capsule Commonly known as: OXY-IR Take 1 capsule (5 mg total) by mouth every 4 (four) hours as needed for pain.   pantoprazole  40 MG tablet Commonly known as: PROTONIX  Take 40 mg by mouth every morning.   PreserVision AREDS 2 Caps Take 1 capsule by mouth in the  morning and at bedtime.   Systane Ultra PF 0.4-0.3 % Soln Generic drug: Polyethyl Glyc-Propyl Glyc PF Place 1 drop into both eyes in the morning, at noon, in the evening, and at bedtime.        Contact information for after-discharge care     Destination     [facility] .   Service: Skilled Nursing Contact information: [address] [place]  [address] [phone]                    Allergies  Allergen Reactions   Statins Other (See Comments)    Muscle weakness and pain   Buprenorphine Hcl Other (See Comments)    Patient questioned this entry   Buspirone Other (See Comments)    Weakness    Lexapro  [Escitalopram ] Other (See Comments)    "Muscle problems"   Meperidine Other (See Comments)    Demerol- questionable allergy- reaction not recalled   Morphine And Codeine Rash    Discharge Exam: Vitals:   10/22/23 1227 10/22/23 2136  BP: 130/63 128/78  Pulse: 68 63  Resp: 18 16  Temp: (!) 97.3 F  (36.3 C) 98.6 F (37 C)  SpO2: 94% 96%    Physical Exam Vitals and nursing note reviewed.  Constitutional:      General: She is not in acute distress.    Appearance: She is not toxic-appearing.     Comments: Frail elderly female  HENT:     Head: Normocephalic.  Cardiovascular:     Rate and Rhythm: Normal rate and regular rhythm.     Heart sounds: Murmur heard.  Pulmonary:     Effort: Pulmonary effort is normal. No respiratory distress.     Breath sounds: Normal breath sounds. No wheezing.  Abdominal:     General: Abdomen is flat. Bowel sounds are normal.     Palpations: Abdomen is soft.  Skin:    Capillary Refill: Capillary refill takes less than 2 seconds.  Neurological:     Mental Status: She is alert and oriented to person, place, and time.     Comments: Hard of hearing     The results of significant diagnostics from this hospitalization (including imaging, microbiology, ancillary and laboratory) are listed below for reference.    Microbiology: No results found for this or any previous visit (from the past 240 hours).   Labs:  Basic Metabolic Panel: Recent Labs  Lab 10/16/23 1626 10/17/23 0411 10/20/23 1159  NA 136 133* 135  K 4.5 4.1 4.4  CL 101 102 101  CO2  --  23 25  GLUCOSE 113* 95 112*  BUN 18 16 23   CREATININE 0.60 0.66 0.77  CALCIUM   --  8.7* 9.1   CBC: Recent Labs  Lab 10/16/23 1626 10/16/23 1628 10/17/23 0411  WBC  --  6.7 5.6  NEUTROABS  --  5.3  --   HGB 13.3 13.2 12.6  HCT 39.0 39.5 38.0  MCV  --  98.5 99.2  PLT  --  149* 147*   Urinalysis    Component Value Date/Time   COLORURINE YELLOW 10/16/2023 1614   APPEARANCEUR CLOUDY (A) 10/16/2023 1614   LABSPEC 1.011 10/16/2023 1614   PHURINE 8.0 10/16/2023 1614   GLUCOSEU NEGATIVE 10/16/2023 1614   HGBUR NEGATIVE 10/16/2023 1614   BILIRUBINUR NEGATIVE 10/16/2023 1614   KETONESUR NEGATIVE 10/16/2023 1614   PROTEINUR NEGATIVE 10/16/2023 1614   NITRITE NEGATIVE 10/16/2023 1614    LEUKOCYTESUR LARGE (A) 10/16/2023 1614  Sepsis Labs Recent Labs  Lab 10/16/23 1628 10/17/23 0411  WBC 6.7 5.6    Procedures/Studies: CT Chest Wo Contrast Result Date: 10/16/2023 CLINICAL DATA:  Rib fracture suspected EXAM: CT CHEST WITHOUT CONTRAST TECHNIQUE: Multidetector CT imaging of the chest was performed following the standard protocol without IV contrast. RADIATION DOSE REDUCTION: This exam was performed according to the departmental dose-optimization program which includes automated exposure control, adjustment of the mA and/or kV according to patient size and/or use of iterative reconstruction technique. COMPARISON:  Chest x-ray performed on the same date. FINDINGS: Cardiovascular: Enlarged heart. Calcific atherosclerosis of the left and right coronary territories. No thoracic aortic aneurysm. Mediastinum/Nodes: Nothing significant. Lungs/Pleura: Trace dependent bilateral pleural effusions. Granulomatous few to the lungs, most notably in the right lower lobe. Upper Abdomen: Nothing significant. Musculoskeletal: Post treatment changes are present from previous vertebral augmentation of T12, L1, and L2. additional age-indeterminate compression fractures are present at T6, T8, T9, and T11. Mild diffuse vertebral body height loss is present within the thoracic spine. Sternal deformity which is favored to be chronic. Chronic left ninth rib fracture. Possible nondisplaced right fourth and sixth rib fractures annotated. IMPRESSION: 1. Possible nondisplaced fractures of the right fourth and sixth ribs. No pneumothorax. 2. Age indeterminate compression deformities in the thoracic spine detailed above. Correlate clinically for point tenderness. 3. Suspected chronic sternal fracture. Correlate with point tenderness. 4. Diffuse osteopenia. Electronically Signed   By: Reagan Camera M.D.   On: 10/16/2023 15:38   DG Pelvis 1-2 Views Result Date: 10/16/2023 CLINICAL DATA:  Pain after fall. EXAM: PELVIS - 1-2  VIEW COMPARISON:  05/10/2023. FINDINGS: Diffuse osteopenia, which limits the sensitivity of the exam. No acute fracture or dislocation. Prior ORIF of a healed left proximal femoral intertrochanteric fracture. Femoral heads are seated within the acetabula. Mild degenerative changes of the bilateral hips. Sacroiliac joints and pubic symphysis appear anatomically aligned. Prior L2 cement augmentation. Grossly unchanged severe L4 compression fracture. IMPRESSION: 1. No acute osseous abnormality.  Diffuse osteopenia. 2. Prior ORIF of a healed left proximal femoral intertrochanteric fracture. 3. Grossly unchanged severe L4 compression fracture. Electronically Signed   By: Mannie Seek M.D.   On: 10/16/2023 12:26   DG Foot Complete Right Result Date: 10/16/2023 CLINICAL DATA:  Pain after fall. EXAM: RIGHT FOOT COMPLETE - 3+ VIEW COMPARISON:  None Available. FINDINGS: Diffuse osteopenia, which limits the sensitivity of the exam. There is no evidence of acute fracture or dislocation. Mild first MTP joint space narrowing. Diffuse interphalangeal joint space narrowing. Mild-to-moderate joint space narrowing of the TMT joints. Plantar calcaneal spur. Vascular calcifications are noted. IMPRESSION: 1. No acute osseous abnormality.  Diffuse osteopenia. 2. 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 Electronically Signed   By: Mannie Seek M.D.   On: 10/16/2023 12:24   DG Chest 2 View Result Date: 10/16/2023 CLINICAL DATA:  Pain after fall EXAM: CHEST - 2 VIEW COMPARISON:  X-ray 06/20/2023 FINDINGS: Enlarged cardiopericardial silhouette. Calcified and tortuous aorta. No pneumothorax or effusion. Slight opacity at the left lung base. Chronic interstitial changes identified. Overlapping cardiac leads. Multilevel augmentation cement along lower thoracic spine vertebral levels. There is also compression deformities at several other levels in the thoracic spine with some kyphosis. Going back to a chest x-ray of 06/12/2022  the more moderate to severe compression deformity of the midthoracic spine is similar but the compression deformities along the lower thoracic spine appears to be increased. Exact level is difficult to determine on this examination. IMPRESSION: Enlarged cardiopericardial silhouette with  calcified aorta. Slight opacity left lung base. Recommend follow-up. Uptake in cement seen at several vertebral levels. Is also multilevel compression deformities in thoracic spine. One level in the lower thoracic spine is progressive compared to available x-ray of May 2024. Please correlate with time course of this compression and if needed further workup with CT or MRI as clinically appropriate. Electronically Signed   By: Adrianna Horde M.D.   On: 10/16/2023 12:21   CT Cervical Spine Wo Contrast Result Date: 10/16/2023 CLINICAL DATA:  89 year old female status post fall from bed.  Pain. EXAM: CT CERVICAL SPINE WITHOUT CONTRAST TECHNIQUE: Multidetector CT imaging of the cervical spine was performed without intravenous contrast. Multiplanar CT image reconstructions were also generated. RADIATION DOSE REDUCTION: This exam was performed according to the departmental dose-optimization program which includes automated exposure control, adjustment of the mA and/or kV according to patient size and/or use of iterative reconstruction technique. COMPARISON:  Head CT today.  Cervical spine CT 06/20/2023. FINDINGS: Alignment: Stable chronic straightening of cervical lordosis. Skull base and vertebrae: Chronic osteopenia. Visualized skull base is intact. No atlanto-occipital dissociation. C1 and C2 appear intact and aligned. No acute osseous abnormality identified. Soft tissues and spinal canal: No prevertebral fluid or swelling. No visible canal hematoma. Negative for age visible noncontrast neck soft tissues, left greater than right carotid bifurcation calcified atherosclerosis. Disc levels: Bulky and partially calcified chronic degenerative  ligamentous hypertrophy about the odontoid. Mild for age cervical spine degeneration elsewhere. Upper chest: Interval sclerosis of a mild T3 superior endplate compression (series 6, image 34), appears to be chronic. Otherwise the visible upper thoracic levels appear intact. Mild apical lung scarring. IMPRESSION: 1. No acute traumatic injury identified in the cervical spine. 2. Mild chronic T3 compression fracture with endplate sclerosis since January. Electronically Signed   By: Marlise Simpers M.D.   On: 10/16/2023 12:01   CT HEAD WO CONTRAST ( ) Result Date: 10/16/2023 CLINICAL DATA:  89 year old female status post fall from bed.  Pain. EXAM: CT HEAD WITHOUT CONTRAST TECHNIQUE: Contiguous axial images were obtained from the base of the skull through the vertex without intravenous contrast. RADIATION DOSE REDUCTION: This exam was performed according to the departmental dose-optimization program which includes automated exposure control, adjustment of the mA and/or kV according to patient size and/or use of iterative reconstruction technique. COMPARISON:  Head CT 06/20/2023. FINDINGS: Brain: Stable cerebral volume. No midline shift, ventriculomegaly, mass effect, evidence of mass lesion, intracranial hemorrhage or evidence of cortically based acute infarction. Stable gray-white matter differentiation throughout the brain. Patchy and confluent bilateral cerebral white matter hypodensity. Small chronic dystrophic calcifications in the anterior left frontal lobe white matter. Vascular: No suspicious intracranial vascular hyperdensity. Calcified atherosclerosis at the skull base. Skull: Chronic right orbital floor fracture. Calvarium appears stable and intact. No acute osseous abnormality identified. Sinuses/Orbits: Visualized paranasal sinuses and mastoids are stable and well aerated. Other: Calcified scalp vessel atherosclerosis. No acute orbit or scalp soft tissue injury identified. IMPRESSION: No acute intracranial  abnormality or acute traumatic injury identified. Electronically Signed   By: Marlise Simpers M.D.   On: 10/16/2023 11:58    Time coordinating discharge: 55 mins  SIGNED:  Unk Garb, DO [facility] 10/23/23, 8:30 AM

## 2023-10-23 NOTE — Progress Notes (Addendum)
 Occupational Therapy Treatment Patient Details Name: Lauren Davidson MRN: [id number] DOB: [birth year] Today's Date: 10/23/2023   History of present illness 89 yr old female who presents to the ED from [facility] after a fall from her bed. Found to have R 4-6 rib fractures, chronic sternal fracture (from compressions per facility). Pt with medical history significant of HFpEF, hypertension, Sjogren's syndrome, and chronic back pain, kyphoplasty Feb 2024 and May 2024, L IM femoral nail December 2024.   OT comments  The pt was seen for progression of functional activity, including out of bed ADL participation and general functional strengthening. She progressed to performing toileting, upper body dressing, and grooming at bathroom level. She was limited by pain from reported sternal fracture. She was unable to stand at the sink for grooming participation, due to fatigue after ambulating to the bathroom and general deconditioning. She presented with good effort. Further OT services are needed to maximize her independence with self-care tasks. Patient will benefit from continued inpatient follow up therapy, <3 hours/day.       If plan is discharge home, recommend the following:  A lot of help with bathing/dressing/bathroom;Assistance with cooking/housework;Assist for transportation;A lot of help with walking and/or transfers   Equipment Recommendations  Other (comment) (defer to next level of care)    Recommendations for Other Services      Precautions / Restrictions Precautions Precautions: Fall Restrictions Weight Bearing Restrictions Per Provider Order: No       Mobility Bed Mobility Overal bed mobility: Needs Assistance Bed Mobility: Supine to Sit Rolling: Mod assist   Supine to sit: Mod assist          Transfers Overall transfer level: Needs assistance Equipment used: Rolling walker (2 wheels) Transfers: Sit to/from Stand Sit to Stand: Min assist, From elevated surface                  Balance     Sitting balance-Leahy Scale: Fair       Standing balance-Leahy Scale: Fair              ADL either performed or assessed with clinical judgement   ADL Overall ADL's : Needs assistance/impaired Eating/Feeding: Independent;Sitting   Grooming: Set up;Sitting Grooming Details (indicate cue type and reason): Pt intended to attempt grooming in standing at the sink, however indicated she did not feel as though she could stand that long, in order to perform. As such, she performed grooming in sitting on the toilet. Specifically, she performed hand washing, face washing, teeth brushing, and mouth rinsing.         Upper Body Dressing : Minimal assistance;Sitting Upper Body Dressing Details (indicate cue type and reason): She required assist to doff her hospital gown, then to donn another clean one in sitting at toilet level. Lower Body Dressing: Maximal assistance Lower Body Dressing Details (indicate cue type and reason): The pt required increased assist to donn her socks seated EOB. Toilet Transfer: Minimal assistance;Rolling walker (2 wheels);Ambulation;Grab bars Toilet Transfer Details (indicate cue type and reason): The pt ambulated to and from the bathroom in her room using a RW. She required use of a grab bar for support during transfer onto and off the toilet. Toileting- Clothing Manipulation and Hygiene: Sitting/lateral lean;Sit to/from stand;Maximal assistance Toileting - Clothing Manipulation Details (indicate cue type and reason): Pt was unable to access her buttocks in standing, in order to perform posterior peri-hygiene, with her stating. "I can't reach it." As such, she required total assist for hygiene  management, as well as assist for steadying in standing and for clothing management.             Vision Baseline Vision/History: 1 Wears glasses           Communication Communication Factors Affecting Communication: Hearing impaired    Cognition Arousal: Alert Behavior During Therapy: WFL for tasks assessed/performed Cognition: No apparent impairments          Following commands: Intact                      Pertinent Vitals/ Pain       Pain Assessment Pain Assessment: 0-10 Pain Score: 7  Pain Location: sternum with movement Pain Intervention(s): Limited activity within patient's tolerance, Monitored during session, Repositioned   Frequency  Min 2X/week        Progress Toward Goals  OT Goals(current goals can now be found in the care plan section)  Progress towards OT goals: Progressing toward goals  Acute Rehab OT Goals OT Goal Formulation: With patient Time For Goal Achievement: 10/31/23 Potential to Achieve Goals: Good  Plan         AM-PAC OT "6 Clicks" Daily Activity     Outcome Measure   Help from another person eating meals?: None Help from another person taking care of personal grooming?: A Little Help from another person toileting, which includes using toliet, bedpan, or urinal?: A Lot Help from another person bathing (including washing, rinsing, drying)?: A Lot Help from another person to put on and taking off regular upper body clothing?: A Little Help from another person to put on and taking off regular lower body clothing?: A Lot 6 Click Score: 16    End of Session Equipment Utilized During Treatment: Rolling walker (2 wheels);Gait belt  OT Visit Diagnosis: Unsteadiness on feet (R26.81);Muscle weakness (generalized) (M62.81);History of falling (Z91.81);Other abnormalities of gait and mobility (R26.89);Pain Pain - part of body:  (sternum)   Activity Tolerance Patient limited by pain   Patient Left in bed;with call bell/phone within reach;with bed alarm set   Nurse Communication Mobility status        Time: 7425-9563 OT Time Calculation (min): 36 min  Charges: OT General Charges $OT Visit: 1 Visit OT Treatments $Self Care/Home Management : 23-37  mins     Sheralyn Dies, OTR/L 10/23/2023, 12:53 PM

## 2023-10-23 NOTE — Progress Notes (Signed)
 PROGRESS NOTE    Lauren Davidson  WNU:[id number] DOB: 05-16-1922 DOA: 10/16/2023 PCP: Tena Feeling, MD  Subjective: Pt seen and examined. Stable. On RA. Pt has ins authorization and bed at [facility]. Called and updated pt's son-in-law Lauren Davidson at pt's request.   Hospital Course: HPI: Lauren Davidson is a 89 y.o. female with medical history significant of HFpEF, hypertension, Sjogren's syndrome, and chronic back pain who presents to the ED after a fall from her bed.   Patient states that she was asleep and rolled out of her bed.  She fell on the floor onto her R side.  She was also noted to have some right foot pain.  She states that her wheel chair was near her bed and she likely hit this.  She denies losing consciousness.  S she does have some pain with taking deep breaths.  He denies dysuria, headache, abdominal pain, dyspnea.  Significant Events: Admitted 10/16/2023 for right rib fractures   Admission Labs: UA spg 1.011, negative nitrite, Large LE, WBC 6-10, few bacteria WBC 6.7, HgB 13.2, plt 149 Na 133, K 4.1, CO2 of 23,  BUN 16, Scr 0.66. glu 95 Vit B12 364 Folate 24 B12 364 Ferritin 46 Fe 61, TIBC 312  Admission Imaging Studies: CXR Enlarged cardiopericardial silhouette with calcified aorta. Slight opacity left lung base. Recommend follow-up. Uptake in cement seen at several vertebral levels. Is also multilevel compression deformities in thoracic spine. One level in the lower thoracic spine is progressive compared to available x-ray of May 2024. Please correlate with time course of this compression and if needed further workup with CT or MRI as clinically appropriate. Pelvic XR No acute osseous abnormality.  Diffuse osteopenia. 2. Prior ORIF of a healed left proximal femoral intertrochanteric fracture. 3. Grossly unchanged severe L4 compression fracture. Right foot XR No acute osseous abnormality.  Diffuse osteopenia. 2. 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 CT head  No acute intracranial abnormality or acute traumatic injury identified CT c-spine No acute traumatic injury identified in the cervical spine. 2. Mild chronic T3 compression fracture with endplate sclerosis since January CT chest Possible nondisplaced fractures of the right fourth and sixth ribs. No pneumothorax. 2. Age indeterminate compression deformities in the thoracic spine detailed above. Correlate clinically for point tenderness. 3. Suspected chronic sternal fracture. Correlate with point tenderness. 4. Diffuse osteopenia.  Significant Labs:   Significant Imaging Studies:   Antibiotic Therapy: Anti-infectives (From admission, onward)    None       Procedures:   Consultants:      Assessment and Plan: * Closed fracture of multiple ribs of right side 10-22-2023 stable. No using po dilaudid . Will stop it. Continued Scheduled tylenol  only given her advanced age of 89.  10-23-2023 after peer-to-peer call with pt's insurance company yesterday, I was successful in getting her ins authorization for SNF rehab approved. Pt stable for DC to [facility] SNF today.  Fall at home, initial encounter 10-22-2023 likely needs SNF. Able to walk 10 feet today.  10-23-2023 stable. DC to SNF today. Pt lives at ALF prior to admission.  DNR (do not resuscitate)/DNI(Do Not Intubate) Pt made DNR/DNI on admission.    Frail elderly 10-22-2023 at 89 year old, I think she needs some SNF rehab given her fall and right rib fractures. Peer-to-peer initiated today.  HOH (hard of hearing) 10-22-2023 stable.  10-23-2023 stable. Pt wears hearing aids. She has them in her ears now.  Chronic kidney disease (CKD), stage II (mild) 10-22-2023 stable.  10-23-2023 stable. Scr 0.77  HTN (hypertension) 10-22-2023 stable on norvasc .  10-23-2023 stable.   DVT prophylaxis: enoxaparin  (LOVENOX ) injection 40 mg Start: 10/17/23 1000    Code Status: Limited: Do not attempt resuscitation (DNR)  -DNR-LIMITED -Do Not Intubate/DNI  Family Communication: called and spoke with pt's son-in-law Lauren Davidson at pt's request. He is aware of pt's discharge to SNF today. Disposition Plan: SNF Reason for continuing need for hospitalization: medically stable for DC today.  Objective: Vitals:   10/21/23 1920 10/22/23 0412 10/22/23 1227 10/22/23 2136  BP: (!) 130/58 (!) 145/61 130/63 128/78  Pulse: (!) 56 (!) 56 68 63  Resp: 18 19 18 16   Temp: 97.6 F (36.4 C) (!) 97.5 F (36.4 C) (!) 97.3 F (36.3 C) 98.6 F (37 C)  TempSrc:      SpO2: 95% 96% 94% 96%  Weight:      Height:        Intake/Output Summary (Last 24 hours) at 10/23/2023 0829 Last data filed at 10/22/2023 1700 Gross per 24 hour  Intake 540 ml  Output 325 ml  Net 215 ml   Filed Weights   10/16/23 1008 10/21/23 1603 10/21/23 1907  Weight: 54.4 kg 53.7 kg 53.1 kg    Examination:  Physical Exam Vitals and nursing note reviewed.  Constitutional:      General: She is not in acute distress.    Appearance: She is not toxic-appearing.     Comments: Frail elderly female  HENT:     Head: Normocephalic.  Cardiovascular:     Rate and Rhythm: Normal rate and regular rhythm.     Heart sounds: Murmur heard.  Pulmonary:     Effort: Pulmonary effort is normal. No respiratory distress.     Breath sounds: Normal breath sounds. No wheezing.  Abdominal:     General: Abdomen is flat. Bowel sounds are normal.     Palpations: Abdomen is soft.  Skin:    Capillary Refill: Capillary refill takes less than 2 seconds.  Neurological:     Mental Status: She is alert and oriented to person, place, and time.     Comments: Hard of hearing     Data Reviewed: I have personally reviewed following labs and imaging studies  CBC: Recent Labs  Lab 10/16/23 1626 10/16/23 1628 10/17/23 0411  WBC  --  6.7 5.6  NEUTROABS  --  5.3  --   HGB 13.3 13.2 12.6  HCT 39.0 39.5 38.0  MCV  --  98.5 99.2  PLT  --  149* 147*   Basic Metabolic  Panel: Recent Labs  Lab 10/16/23 1626 10/17/23 0411 10/20/23 1159  NA 136 133* 135  K 4.5 4.1 4.4  CL 101 102 101  CO2  --  23 25  GLUCOSE 113* 95 112*  BUN 18 16 23   CREATININE 0.60 0.66 0.77  CALCIUM   --  8.7* 9.1   GFR: Estimated Creatinine Clearance: 32.1 mL/min (by C-G formula based on SCr of 0.77 mg/dL).  Scheduled Meds:  acetaminophen   650 mg Oral Q6H   amLODipine   2.5 mg Oral Daily   calcium -vitamin D   1 tablet Oral Q breakfast   docusate sodium   100 mg Oral BID   enoxaparin  (LOVENOX ) injection  40 mg Subcutaneous Q24H   feeding supplement  237 mL Oral BID BM   lidocaine   1 patch Transdermal Q24H   multivitamin  1 tablet Oral Daily   pantoprazole   40 mg Oral q morning   artificial tears  1 drop Both Eyes QID   Continuous Infusions:   LOS: 7 days   Time spent: 55 minutes  Unk Garb, DO  [facility]  10/23/2023, 8:29 AM

## 2023-10-23 NOTE — Progress Notes (Signed)
 Report given to Tamara at [facility] .

## 2023-10-23 NOTE — TOC Transition Note (Signed)
 Transition of Care [facility]) - Discharge Note   Patient Details  Name: Lauren Davidson MRN: [id number] Date of Birth: [birth year]  Transition of [facility]) CM/SW Contact:  Kathryn Parish, RN Phone Number: 10/23/2023, 10:22 AM   Clinical Narrative:    Patient will DC to:[facility] Family notified: Manuelita Sellar YN:WGNF   Per MD patient ready for DC to . RN to call report prior to discharge [phone] Rm [id number]. RN, patient, patient's family, and facility notified of DC. Discharge Summary and FL2 sent to facility via HUB. DC packet on chart includes Face Sheet, Medical Necessity, Discharge Summary, signed DNR and prescription. Ambulance [facility]) transport requested for patient.   CM signing off. Please consult us  again if new needs arise.     Final next level of care: Skilled Nursing Facility Barriers to Discharge: Barriers Resolved   Patient Goals and CMS Choice Patient states their goals for this hospitalization and ongoing recovery are:: Return to [facility]   Choice offered to / list presented to : NA      Discharge Placement   Existing PASRR number confirmed : 10/23/23          Patient chooses bed at: [facility] Patient to be transferred to facility by: [facility] Name of family member notified: Adaline Holly Patient and family notified of of transfer: 10/23/23  Discharge Plan and Services Additional resources added to the After Visit Summary for     Discharge Planning Services: CM Consult Post Acute Care Choice: NA          DME Arranged: N/A DME Agency: NA       HH Arranged:  (Currently active with [facility] PT) HH Agency: NA        Social Drivers of Health (SDOH) Interventions SDOH Screenings   Food Insecurity: No Food Insecurity (10/17/2023)  Housing: Patient Declined (10/17/2023)  Transportation Needs: Patient Declined (10/17/2023)  Utilities: Patient Declined (10/17/2023)  Social Connections: Patient Declined (10/17/2023)  Tobacco Use:  Low Risk  (10/16/2023)     Readmission Risk Interventions     No data to display

## 2023-10-23 NOTE — NC FL2 (Signed)
 [place]  MEDICAID FL2 LEVEL OF CARE FORM     IDENTIFICATION  Patient Name: Lauren Davidson Birthdate: [birth year] Sex: female Admission Date (Current Location): 10/16/2023  [facility] and [place] Number:  Producer, television/film/video and Address:  [facility],  [address], [place] [address]      Provider Number: [id number]  Attending Physician Name and Address:  Unk Garb, DO  Relative Name and Phone Number:       Current Level of Care: Hospital Recommended Level of Care: Skilled Nursing Facility Prior Approval Number:    Date Approved/Denied:   PASRR Number: [id number]  Discharge Plan: SNF    Current Diagnoses: Patient Active Problem List   Diagnosis Date Noted   Fall at home, initial encounter 10/22/2023   Frail elderly 10/22/2023   DNR (do not resuscitate)/DNI(Do Not Intubate) 10/22/2023   Closed fracture of multiple ribs of right side 10/16/2023   HOH (hard of hearing) 05/16/2023   Back pain 09/17/2021   Lumbar spinal stenosis 09/17/2021   HTN (hypertension) 09/17/2021   HLD (hyperlipidemia) 09/17/2021   Gastroesophageal reflux disease without esophagitis 03/01/2019   Sjogren's syndrome (HCC) 05/18/2015   Chronic kidney disease (CKD), stage II (mild) 05/18/2015   Generalized anxiety disorder 05/18/2015   Osteoporosis 05/18/2015   Status post intraocular lens implant 11/14/2011    Orientation RESPIRATION BLADDER Height & Weight     Self, Time, Situation, Place  Normal Incontinent, External catheter Weight: 53.1 kg Height:  5\' 5"  (165.1 cm)  BEHAVIORAL SYMPTOMS/MOOD NEUROLOGICAL BOWEL NUTRITION STATUS      Continent Diet  AMBULATORY STATUS COMMUNICATION OF NEEDS Skin   Limited Assist Verbally Normal                       Personal Care Assistance Level of Assistance  Bathing, Feeding, Dressing Bathing Assistance: Limited assistance Feeding assistance: Limited assistance Dressing Assistance: Limited assistance     Functional Limitations  Info  Hearing   Hearing Info: Impaired      SPECIAL CARE FACTORS FREQUENCY  PT (By licensed PT), OT (By licensed OT)     PT Frequency: 5x Weekly OT Frequency: 5x Weekly            Contractures Contractures Info: Not present    Additional Factors Info  Code Status, Allergies Code Status Info: DNR Limited Allergies Info: Statins, Buprenorphine Hcl, Buspirone, Lexapro  (Escitalopram ), Meperidine, Morphine And Codeine           Current Medications (10/23/2023):  This is the current hospital active medication list Current Facility-Administered Medications  Medication Dose Route Frequency Provider Last Rate Last Admin   acetaminophen  (TYLENOL ) tablet 650 mg  650 mg Oral Q6H Joette Mustard, MD   650 mg at 10/22/23 2223   amLODipine  (NORVASC ) tablet 2.5 mg  2.5 mg Oral Daily Joette Mustard, MD   2.5 mg at 10/22/23 1039   calcium -vitamin D  (OSCAL WITH D) 500-5 MG-MCG per tablet 1 tablet  1 tablet Oral Q breakfast Joette Mustard, MD   1 tablet at 10/22/23 1039   docusate sodium  (COLACE) capsule 100 mg  100 mg Oral BID Sreeram, Narendranath, MD   100 mg at 10/22/23 2223   enoxaparin  (LOVENOX ) injection 40 mg  40 mg Subcutaneous Q24H Joette Mustard, MD   40 mg at 10/22/23 1041   feeding supplement (ENSURE PLUS HIGH PROTEIN) liquid 237 mL  237 mL Oral BID BM Aisha Hove, MD   237 mL at 10/22/23 1042   lidocaine  (  LIDODERM ) 5 % 1 patch  1 patch Transdermal Q24H Joette Mustard, MD   1 patch at 10/22/23 2225   multivitamin (PROSIGHT) tablet 1 tablet  1 tablet Oral Daily Sreeram, Narendranath, MD   1 tablet at 10/22/23 1040   ondansetron  (ZOFRAN ) tablet 4 mg  4 mg Oral Q6H PRN Joette Mustard, MD       Or   ondansetron  (ZOFRAN ) injection 4 mg  4 mg Intravenous Q6H PRN Joette Mustard, MD       pantoprazole  (PROTONIX ) EC tablet 40 mg  40 mg Oral q morning Sreeram, Narendranath, MD   40 mg at 10/22/23 1039   polyethylene glycol (MIRALAX  / GLYCOLAX ) packet 17 g  17 g  Oral Daily PRN Joette Mustard, MD       polyvinyl alcohol  (LIQUIFILM TEARS) 1.4 % ophthalmic solution 1 drop  1 drop Both Eyes QID Joette Mustard, MD   1 drop at 10/22/23 2227     Discharge Medications: Please see discharge summary for a list of discharge medications.  Relevant [facility] Medication List       STOP taking these medications     ferrous gluconate  324 MG tablet Commonly known as: FERGON           TAKE these medications     acetaminophen  500 MG tablet Commonly known as: TYLENOL  Take 500 mg by mouth every 6 (six) hours as needed (for pain).    amLODipine  5 MG tablet Commonly known as: NORVASC  Take 1 tablet (5 mg total) by mouth daily.    CALCIUM +D3 PO Take 1 tablet by mouth daily with breakfast.    docusate sodium  100 MG capsule Commonly known as: COLACE Take 1 capsule (100 mg total) by mouth 2 (two) times daily.    ibuprofen  200 MG tablet Commonly known as: ADVIL  Take 200 mg by mouth every 6 (six) hours as needed for moderate pain (pain score 4-6) or mild pain (pain score 1-3).    oxycodone  5 MG capsule Commonly known as: OXY-IR Take 1 capsule (5 mg total) by mouth every 4 (four) hours as needed for pain.    pantoprazole  40 MG tablet Commonly known as: PROTONIX  Take 40 mg by mouth every morning.    PreserVision AREDS 2 Caps Take 1 capsule by mouth in the morning and at bedtime.    Systane Ultra PF 0.4-0.3 % Soln Generic drug: Polyethyl Glyc-Propyl Glyc PF Place 1 drop into both eyes in the morning, at noon, in the evening, and at bedtime.    ging Results:  Relevant Lab Results:   Additional Information SSN: [id number]  Kathryn Parish, RN

## 2023-10-30 DIAGNOSIS — N182 Chronic kidney disease, stage 2 (mild): Secondary | ICD-10-CM | POA: Diagnosis not present

## 2023-10-30 DIAGNOSIS — H9190 Unspecified hearing loss, unspecified ear: Secondary | ICD-10-CM | POA: Diagnosis not present

## 2023-10-30 DIAGNOSIS — I5032 Chronic diastolic (congestive) heart failure: Secondary | ICD-10-CM | POA: Diagnosis not present

## 2023-10-30 DIAGNOSIS — L89896 Pressure-induced deep tissue damage of other site: Secondary | ICD-10-CM | POA: Diagnosis not present

## 2023-10-30 DIAGNOSIS — E785 Hyperlipidemia, unspecified: Secondary | ICD-10-CM | POA: Diagnosis not present

## 2023-10-30 DIAGNOSIS — L97811 Non-pressure chronic ulcer of other part of right lower leg limited to breakdown of skin: Secondary | ICD-10-CM | POA: Diagnosis not present

## 2023-11-01 DIAGNOSIS — S72142D Displaced intertrochanteric fracture of left femur, subsequent encounter for closed fracture with routine healing: Secondary | ICD-10-CM | POA: Diagnosis not present

## 2023-11-01 DIAGNOSIS — Z9181 History of falling: Secondary | ICD-10-CM | POA: Diagnosis not present

## 2023-11-01 DIAGNOSIS — R2689 Other abnormalities of gait and mobility: Secondary | ICD-10-CM | POA: Diagnosis not present

## 2023-11-01 DIAGNOSIS — R262 Difficulty in walking, not elsewhere classified: Secondary | ICD-10-CM | POA: Diagnosis not present

## 2023-11-01 DIAGNOSIS — R2681 Unsteadiness on feet: Secondary | ICD-10-CM | POA: Diagnosis not present

## 2023-11-01 DIAGNOSIS — M6281 Muscle weakness (generalized): Secondary | ICD-10-CM | POA: Diagnosis not present

## 2023-11-01 DIAGNOSIS — G8929 Other chronic pain: Secondary | ICD-10-CM | POA: Diagnosis not present

## 2023-11-06 DIAGNOSIS — L97811 Non-pressure chronic ulcer of other part of right lower leg limited to breakdown of skin: Secondary | ICD-10-CM | POA: Diagnosis not present

## 2023-11-06 DIAGNOSIS — E785 Hyperlipidemia, unspecified: Secondary | ICD-10-CM | POA: Diagnosis not present

## 2023-11-06 DIAGNOSIS — L89896 Pressure-induced deep tissue damage of other site: Secondary | ICD-10-CM | POA: Diagnosis not present

## 2023-11-06 DIAGNOSIS — H9190 Unspecified hearing loss, unspecified ear: Secondary | ICD-10-CM | POA: Diagnosis not present

## 2023-11-14 DIAGNOSIS — R54 Age-related physical debility: Secondary | ICD-10-CM | POA: Diagnosis not present

## 2023-11-14 DIAGNOSIS — M25561 Pain in right knee: Secondary | ICD-10-CM | POA: Diagnosis not present

## 2023-11-14 DIAGNOSIS — I129 Hypertensive chronic kidney disease with stage 1 through stage 4 chronic kidney disease, or unspecified chronic kidney disease: Secondary | ICD-10-CM | POA: Diagnosis not present

## 2023-11-14 DIAGNOSIS — M6284 Sarcopenia: Secondary | ICD-10-CM | POA: Diagnosis not present

## 2023-11-14 DIAGNOSIS — I10 Essential (primary) hypertension: Secondary | ICD-10-CM | POA: Diagnosis not present

## 2023-11-14 DIAGNOSIS — M3500 Sicca syndrome, unspecified: Secondary | ICD-10-CM | POA: Diagnosis not present

## 2023-11-14 DIAGNOSIS — N182 Chronic kidney disease, stage 2 (mild): Secondary | ICD-10-CM | POA: Diagnosis not present

## 2023-11-14 DIAGNOSIS — R600 Localized edema: Secondary | ICD-10-CM | POA: Diagnosis not present

## 2023-11-15 DIAGNOSIS — M25561 Pain in right knee: Secondary | ICD-10-CM | POA: Diagnosis not present

## 2023-11-15 DIAGNOSIS — R600 Localized edema: Secondary | ICD-10-CM | POA: Diagnosis not present

## 2023-11-15 DIAGNOSIS — M3500 Sicca syndrome, unspecified: Secondary | ICD-10-CM | POA: Diagnosis not present

## 2023-11-15 DIAGNOSIS — M6284 Sarcopenia: Secondary | ICD-10-CM | POA: Diagnosis not present

## 2023-11-15 DIAGNOSIS — M25562 Pain in left knee: Secondary | ICD-10-CM | POA: Diagnosis not present

## 2023-11-15 DIAGNOSIS — Z9181 History of falling: Secondary | ICD-10-CM | POA: Diagnosis not present

## 2023-11-18 DIAGNOSIS — M25561 Pain in right knee: Secondary | ICD-10-CM | POA: Diagnosis not present

## 2023-11-18 DIAGNOSIS — M6284 Sarcopenia: Secondary | ICD-10-CM | POA: Diagnosis not present

## 2023-11-18 DIAGNOSIS — M25562 Pain in left knee: Secondary | ICD-10-CM | POA: Diagnosis not present

## 2023-11-18 DIAGNOSIS — M3500 Sicca syndrome, unspecified: Secondary | ICD-10-CM | POA: Diagnosis not present

## 2023-11-18 DIAGNOSIS — R600 Localized edema: Secondary | ICD-10-CM | POA: Diagnosis not present

## 2023-11-18 DIAGNOSIS — Z9181 History of falling: Secondary | ICD-10-CM | POA: Diagnosis not present

## 2023-11-20 DIAGNOSIS — Z9181 History of falling: Secondary | ICD-10-CM | POA: Diagnosis not present

## 2023-11-20 DIAGNOSIS — M25561 Pain in right knee: Secondary | ICD-10-CM | POA: Diagnosis not present

## 2023-11-20 DIAGNOSIS — R600 Localized edema: Secondary | ICD-10-CM | POA: Diagnosis not present

## 2023-11-20 DIAGNOSIS — M3500 Sicca syndrome, unspecified: Secondary | ICD-10-CM | POA: Diagnosis not present

## 2023-11-20 DIAGNOSIS — M6284 Sarcopenia: Secondary | ICD-10-CM | POA: Diagnosis not present

## 2023-11-20 DIAGNOSIS — M25562 Pain in left knee: Secondary | ICD-10-CM | POA: Diagnosis not present

## 2023-11-27 DIAGNOSIS — R600 Localized edema: Secondary | ICD-10-CM | POA: Diagnosis not present

## 2023-11-27 DIAGNOSIS — Z9181 History of falling: Secondary | ICD-10-CM | POA: Diagnosis not present

## 2023-11-27 DIAGNOSIS — I129 Hypertensive chronic kidney disease with stage 1 through stage 4 chronic kidney disease, or unspecified chronic kidney disease: Secondary | ICD-10-CM | POA: Diagnosis not present

## 2023-11-27 DIAGNOSIS — M3500 Sicca syndrome, unspecified: Secondary | ICD-10-CM | POA: Diagnosis not present

## 2023-11-27 DIAGNOSIS — M25561 Pain in right knee: Secondary | ICD-10-CM | POA: Diagnosis not present

## 2023-11-27 DIAGNOSIS — S2241XD Multiple fractures of ribs, right side, subsequent encounter for fracture with routine healing: Secondary | ICD-10-CM | POA: Diagnosis not present

## 2023-11-27 DIAGNOSIS — M6284 Sarcopenia: Secondary | ICD-10-CM | POA: Diagnosis not present

## 2023-11-27 DIAGNOSIS — M25562 Pain in left knee: Secondary | ICD-10-CM | POA: Diagnosis not present

## 2023-11-27 DIAGNOSIS — N182 Chronic kidney disease, stage 2 (mild): Secondary | ICD-10-CM | POA: Diagnosis not present

## 2023-11-28 DIAGNOSIS — M6284 Sarcopenia: Secondary | ICD-10-CM | POA: Diagnosis not present

## 2023-11-28 DIAGNOSIS — S2241XD Multiple fractures of ribs, right side, subsequent encounter for fracture with routine healing: Secondary | ICD-10-CM | POA: Diagnosis not present

## 2023-11-28 DIAGNOSIS — I129 Hypertensive chronic kidney disease with stage 1 through stage 4 chronic kidney disease, or unspecified chronic kidney disease: Secondary | ICD-10-CM | POA: Diagnosis not present

## 2023-11-28 DIAGNOSIS — M3500 Sicca syndrome, unspecified: Secondary | ICD-10-CM | POA: Diagnosis not present

## 2023-11-28 DIAGNOSIS — Z9181 History of falling: Secondary | ICD-10-CM | POA: Diagnosis not present

## 2023-11-28 DIAGNOSIS — M25561 Pain in right knee: Secondary | ICD-10-CM | POA: Diagnosis not present

## 2023-11-28 DIAGNOSIS — M25562 Pain in left knee: Secondary | ICD-10-CM | POA: Diagnosis not present

## 2023-11-28 DIAGNOSIS — R600 Localized edema: Secondary | ICD-10-CM | POA: Diagnosis not present

## 2023-11-28 DIAGNOSIS — N182 Chronic kidney disease, stage 2 (mild): Secondary | ICD-10-CM | POA: Diagnosis not present

## 2023-12-01 DIAGNOSIS — R262 Difficulty in walking, not elsewhere classified: Secondary | ICD-10-CM | POA: Diagnosis not present

## 2023-12-01 DIAGNOSIS — S72142D Displaced intertrochanteric fracture of left femur, subsequent encounter for closed fracture with routine healing: Secondary | ICD-10-CM | POA: Diagnosis not present

## 2023-12-01 DIAGNOSIS — G8929 Other chronic pain: Secondary | ICD-10-CM | POA: Diagnosis not present

## 2023-12-01 DIAGNOSIS — R2689 Other abnormalities of gait and mobility: Secondary | ICD-10-CM | POA: Diagnosis not present

## 2023-12-01 DIAGNOSIS — R2681 Unsteadiness on feet: Secondary | ICD-10-CM | POA: Diagnosis not present

## 2023-12-01 DIAGNOSIS — Z9181 History of falling: Secondary | ICD-10-CM | POA: Diagnosis not present

## 2023-12-01 DIAGNOSIS — M6281 Muscle weakness (generalized): Secondary | ICD-10-CM | POA: Diagnosis not present

## 2023-12-02 DIAGNOSIS — R600 Localized edema: Secondary | ICD-10-CM | POA: Diagnosis not present

## 2023-12-02 DIAGNOSIS — I129 Hypertensive chronic kidney disease with stage 1 through stage 4 chronic kidney disease, or unspecified chronic kidney disease: Secondary | ICD-10-CM | POA: Diagnosis not present

## 2023-12-02 DIAGNOSIS — Z9181 History of falling: Secondary | ICD-10-CM | POA: Diagnosis not present

## 2023-12-02 DIAGNOSIS — M3500 Sicca syndrome, unspecified: Secondary | ICD-10-CM | POA: Diagnosis not present

## 2023-12-02 DIAGNOSIS — N182 Chronic kidney disease, stage 2 (mild): Secondary | ICD-10-CM | POA: Diagnosis not present

## 2023-12-02 DIAGNOSIS — M6284 Sarcopenia: Secondary | ICD-10-CM | POA: Diagnosis not present

## 2023-12-02 DIAGNOSIS — S2241XD Multiple fractures of ribs, right side, subsequent encounter for fracture with routine healing: Secondary | ICD-10-CM | POA: Diagnosis not present

## 2023-12-02 DIAGNOSIS — M25562 Pain in left knee: Secondary | ICD-10-CM | POA: Diagnosis not present

## 2023-12-02 DIAGNOSIS — M25561 Pain in right knee: Secondary | ICD-10-CM | POA: Diagnosis not present

## 2023-12-05 DIAGNOSIS — N1831 Chronic kidney disease, stage 3a: Secondary | ICD-10-CM | POA: Diagnosis not present

## 2023-12-05 DIAGNOSIS — R296 Repeated falls: Secondary | ICD-10-CM | POA: Diagnosis not present

## 2023-12-05 DIAGNOSIS — R031 Nonspecific low blood-pressure reading: Secondary | ICD-10-CM | POA: Diagnosis not present

## 2023-12-05 DIAGNOSIS — K219 Gastro-esophageal reflux disease without esophagitis: Secondary | ICD-10-CM | POA: Diagnosis not present

## 2023-12-05 DIAGNOSIS — Z8781 Personal history of (healed) traumatic fracture: Secondary | ICD-10-CM | POA: Diagnosis not present

## 2023-12-06 DIAGNOSIS — R600 Localized edema: Secondary | ICD-10-CM | POA: Diagnosis not present

## 2023-12-06 DIAGNOSIS — M25562 Pain in left knee: Secondary | ICD-10-CM | POA: Diagnosis not present

## 2023-12-06 DIAGNOSIS — I129 Hypertensive chronic kidney disease with stage 1 through stage 4 chronic kidney disease, or unspecified chronic kidney disease: Secondary | ICD-10-CM | POA: Diagnosis not present

## 2023-12-06 DIAGNOSIS — M6284 Sarcopenia: Secondary | ICD-10-CM | POA: Diagnosis not present

## 2023-12-06 DIAGNOSIS — Z9181 History of falling: Secondary | ICD-10-CM | POA: Diagnosis not present

## 2023-12-06 DIAGNOSIS — M3500 Sicca syndrome, unspecified: Secondary | ICD-10-CM | POA: Diagnosis not present

## 2023-12-06 DIAGNOSIS — N182 Chronic kidney disease, stage 2 (mild): Secondary | ICD-10-CM | POA: Diagnosis not present

## 2023-12-06 DIAGNOSIS — S2241XD Multiple fractures of ribs, right side, subsequent encounter for fracture with routine healing: Secondary | ICD-10-CM | POA: Diagnosis not present

## 2023-12-06 DIAGNOSIS — M25561 Pain in right knee: Secondary | ICD-10-CM | POA: Diagnosis not present

## 2023-12-10 DIAGNOSIS — S2241XD Multiple fractures of ribs, right side, subsequent encounter for fracture with routine healing: Secondary | ICD-10-CM | POA: Diagnosis not present

## 2023-12-10 DIAGNOSIS — M25561 Pain in right knee: Secondary | ICD-10-CM | POA: Diagnosis not present

## 2023-12-10 DIAGNOSIS — M3500 Sicca syndrome, unspecified: Secondary | ICD-10-CM | POA: Diagnosis not present

## 2023-12-10 DIAGNOSIS — Z9181 History of falling: Secondary | ICD-10-CM | POA: Diagnosis not present

## 2023-12-10 DIAGNOSIS — I129 Hypertensive chronic kidney disease with stage 1 through stage 4 chronic kidney disease, or unspecified chronic kidney disease: Secondary | ICD-10-CM | POA: Diagnosis not present

## 2023-12-10 DIAGNOSIS — M25562 Pain in left knee: Secondary | ICD-10-CM | POA: Diagnosis not present

## 2023-12-10 DIAGNOSIS — N182 Chronic kidney disease, stage 2 (mild): Secondary | ICD-10-CM | POA: Diagnosis not present

## 2023-12-10 DIAGNOSIS — R600 Localized edema: Secondary | ICD-10-CM | POA: Diagnosis not present

## 2023-12-10 DIAGNOSIS — M6284 Sarcopenia: Secondary | ICD-10-CM | POA: Diagnosis not present

## 2023-12-11 DIAGNOSIS — M25561 Pain in right knee: Secondary | ICD-10-CM | POA: Diagnosis not present

## 2023-12-11 DIAGNOSIS — M6284 Sarcopenia: Secondary | ICD-10-CM | POA: Diagnosis not present

## 2023-12-11 DIAGNOSIS — I129 Hypertensive chronic kidney disease with stage 1 through stage 4 chronic kidney disease, or unspecified chronic kidney disease: Secondary | ICD-10-CM | POA: Diagnosis not present

## 2023-12-11 DIAGNOSIS — Z9181 History of falling: Secondary | ICD-10-CM | POA: Diagnosis not present

## 2023-12-11 DIAGNOSIS — M3500 Sicca syndrome, unspecified: Secondary | ICD-10-CM | POA: Diagnosis not present

## 2023-12-11 DIAGNOSIS — S2241XD Multiple fractures of ribs, right side, subsequent encounter for fracture with routine healing: Secondary | ICD-10-CM | POA: Diagnosis not present

## 2023-12-11 DIAGNOSIS — N182 Chronic kidney disease, stage 2 (mild): Secondary | ICD-10-CM | POA: Diagnosis not present

## 2023-12-11 DIAGNOSIS — R600 Localized edema: Secondary | ICD-10-CM | POA: Diagnosis not present

## 2023-12-11 DIAGNOSIS — M25562 Pain in left knee: Secondary | ICD-10-CM | POA: Diagnosis not present

## 2023-12-12 DIAGNOSIS — I10 Essential (primary) hypertension: Secondary | ICD-10-CM | POA: Diagnosis not present

## 2023-12-12 DIAGNOSIS — R52 Pain, unspecified: Secondary | ICD-10-CM | POA: Diagnosis not present

## 2023-12-12 DIAGNOSIS — K219 Gastro-esophageal reflux disease without esophagitis: Secondary | ICD-10-CM | POA: Diagnosis not present

## 2023-12-12 DIAGNOSIS — I129 Hypertensive chronic kidney disease with stage 1 through stage 4 chronic kidney disease, or unspecified chronic kidney disease: Secondary | ICD-10-CM | POA: Diagnosis not present

## 2023-12-12 DIAGNOSIS — Z0000 Encounter for general adult medical examination without abnormal findings: Secondary | ICD-10-CM | POA: Diagnosis not present

## 2023-12-12 DIAGNOSIS — M3500 Sicca syndrome, unspecified: Secondary | ICD-10-CM | POA: Diagnosis not present

## 2023-12-12 DIAGNOSIS — F01A0 Vascular dementia, mild, without behavioral disturbance, psychotic disturbance, mood disturbance, and anxiety: Secondary | ICD-10-CM | POA: Diagnosis not present

## 2023-12-12 DIAGNOSIS — N182 Chronic kidney disease, stage 2 (mild): Secondary | ICD-10-CM | POA: Diagnosis not present

## 2023-12-12 DIAGNOSIS — R54 Age-related physical debility: Secondary | ICD-10-CM | POA: Diagnosis not present

## 2023-12-17 DIAGNOSIS — M3500 Sicca syndrome, unspecified: Secondary | ICD-10-CM | POA: Diagnosis not present

## 2023-12-17 DIAGNOSIS — I129 Hypertensive chronic kidney disease with stage 1 through stage 4 chronic kidney disease, or unspecified chronic kidney disease: Secondary | ICD-10-CM | POA: Diagnosis not present

## 2023-12-17 DIAGNOSIS — N182 Chronic kidney disease, stage 2 (mild): Secondary | ICD-10-CM | POA: Diagnosis not present

## 2023-12-17 DIAGNOSIS — M25561 Pain in right knee: Secondary | ICD-10-CM | POA: Diagnosis not present

## 2023-12-17 DIAGNOSIS — S2241XD Multiple fractures of ribs, right side, subsequent encounter for fracture with routine healing: Secondary | ICD-10-CM | POA: Diagnosis not present

## 2023-12-17 DIAGNOSIS — Z9181 History of falling: Secondary | ICD-10-CM | POA: Diagnosis not present

## 2023-12-17 DIAGNOSIS — M6284 Sarcopenia: Secondary | ICD-10-CM | POA: Diagnosis not present

## 2023-12-17 DIAGNOSIS — M25562 Pain in left knee: Secondary | ICD-10-CM | POA: Diagnosis not present

## 2023-12-17 DIAGNOSIS — R600 Localized edema: Secondary | ICD-10-CM | POA: Diagnosis not present

## 2023-12-18 DIAGNOSIS — N182 Chronic kidney disease, stage 2 (mild): Secondary | ICD-10-CM | POA: Diagnosis not present

## 2023-12-18 DIAGNOSIS — I5032 Chronic diastolic (congestive) heart failure: Secondary | ICD-10-CM | POA: Diagnosis not present

## 2023-12-24 DIAGNOSIS — Z9181 History of falling: Secondary | ICD-10-CM | POA: Diagnosis not present

## 2023-12-24 DIAGNOSIS — S2241XD Multiple fractures of ribs, right side, subsequent encounter for fracture with routine healing: Secondary | ICD-10-CM | POA: Diagnosis not present

## 2023-12-24 DIAGNOSIS — M6284 Sarcopenia: Secondary | ICD-10-CM | POA: Diagnosis not present

## 2023-12-24 DIAGNOSIS — R600 Localized edema: Secondary | ICD-10-CM | POA: Diagnosis not present

## 2023-12-24 DIAGNOSIS — N182 Chronic kidney disease, stage 2 (mild): Secondary | ICD-10-CM | POA: Diagnosis not present

## 2023-12-24 DIAGNOSIS — I129 Hypertensive chronic kidney disease with stage 1 through stage 4 chronic kidney disease, or unspecified chronic kidney disease: Secondary | ICD-10-CM | POA: Diagnosis not present

## 2023-12-24 DIAGNOSIS — M25561 Pain in right knee: Secondary | ICD-10-CM | POA: Diagnosis not present

## 2023-12-24 DIAGNOSIS — M25562 Pain in left knee: Secondary | ICD-10-CM | POA: Diagnosis not present

## 2023-12-24 DIAGNOSIS — M3500 Sicca syndrome, unspecified: Secondary | ICD-10-CM | POA: Diagnosis not present

## 2023-12-30 DIAGNOSIS — I129 Hypertensive chronic kidney disease with stage 1 through stage 4 chronic kidney disease, or unspecified chronic kidney disease: Secondary | ICD-10-CM | POA: Diagnosis not present

## 2024-01-01 DIAGNOSIS — R2689 Other abnormalities of gait and mobility: Secondary | ICD-10-CM | POA: Diagnosis not present

## 2024-01-01 DIAGNOSIS — S72142D Displaced intertrochanteric fracture of left femur, subsequent encounter for closed fracture with routine healing: Secondary | ICD-10-CM | POA: Diagnosis not present

## 2024-01-01 DIAGNOSIS — R262 Difficulty in walking, not elsewhere classified: Secondary | ICD-10-CM | POA: Diagnosis not present

## 2024-01-01 DIAGNOSIS — R2681 Unsteadiness on feet: Secondary | ICD-10-CM | POA: Diagnosis not present

## 2024-01-01 DIAGNOSIS — Z9181 History of falling: Secondary | ICD-10-CM | POA: Diagnosis not present

## 2024-01-01 DIAGNOSIS — I129 Hypertensive chronic kidney disease with stage 1 through stage 4 chronic kidney disease, or unspecified chronic kidney disease: Secondary | ICD-10-CM | POA: Diagnosis not present

## 2024-01-01 DIAGNOSIS — N182 Chronic kidney disease, stage 2 (mild): Secondary | ICD-10-CM | POA: Diagnosis not present

## 2024-01-01 DIAGNOSIS — M6281 Muscle weakness (generalized): Secondary | ICD-10-CM | POA: Diagnosis not present

## 2024-01-01 DIAGNOSIS — S2241XD Multiple fractures of ribs, right side, subsequent encounter for fracture with routine healing: Secondary | ICD-10-CM | POA: Diagnosis not present

## 2024-01-01 DIAGNOSIS — G8929 Other chronic pain: Secondary | ICD-10-CM | POA: Diagnosis not present

## 2024-01-05 DIAGNOSIS — S2241XD Multiple fractures of ribs, right side, subsequent encounter for fracture with routine healing: Secondary | ICD-10-CM | POA: Diagnosis not present

## 2024-01-05 DIAGNOSIS — N182 Chronic kidney disease, stage 2 (mild): Secondary | ICD-10-CM | POA: Diagnosis not present

## 2024-01-05 DIAGNOSIS — M3500 Sicca syndrome, unspecified: Secondary | ICD-10-CM | POA: Diagnosis not present

## 2024-01-05 DIAGNOSIS — R600 Localized edema: Secondary | ICD-10-CM | POA: Diagnosis not present

## 2024-01-05 DIAGNOSIS — M25562 Pain in left knee: Secondary | ICD-10-CM | POA: Diagnosis not present

## 2024-01-05 DIAGNOSIS — M25561 Pain in right knee: Secondary | ICD-10-CM | POA: Diagnosis not present

## 2024-01-05 DIAGNOSIS — I129 Hypertensive chronic kidney disease with stage 1 through stage 4 chronic kidney disease, or unspecified chronic kidney disease: Secondary | ICD-10-CM | POA: Diagnosis not present

## 2024-01-05 DIAGNOSIS — Z9181 History of falling: Secondary | ICD-10-CM | POA: Diagnosis not present

## 2024-01-08 DIAGNOSIS — Z9181 History of falling: Secondary | ICD-10-CM | POA: Diagnosis not present

## 2024-01-09 DIAGNOSIS — R54 Age-related physical debility: Secondary | ICD-10-CM | POA: Diagnosis not present

## 2024-01-09 DIAGNOSIS — N182 Chronic kidney disease, stage 2 (mild): Secondary | ICD-10-CM | POA: Diagnosis not present

## 2024-01-09 DIAGNOSIS — I129 Hypertensive chronic kidney disease with stage 1 through stage 4 chronic kidney disease, or unspecified chronic kidney disease: Secondary | ICD-10-CM | POA: Diagnosis not present

## 2024-01-09 DIAGNOSIS — I10 Essential (primary) hypertension: Secondary | ICD-10-CM | POA: Diagnosis not present

## 2024-01-15 DIAGNOSIS — M6284 Sarcopenia: Secondary | ICD-10-CM | POA: Diagnosis not present

## 2024-01-20 DIAGNOSIS — Z9181 History of falling: Secondary | ICD-10-CM | POA: Diagnosis not present

## 2024-01-20 DIAGNOSIS — R600 Localized edema: Secondary | ICD-10-CM | POA: Diagnosis not present

## 2024-01-20 DIAGNOSIS — M25562 Pain in left knee: Secondary | ICD-10-CM | POA: Diagnosis not present

## 2024-01-20 DIAGNOSIS — I129 Hypertensive chronic kidney disease with stage 1 through stage 4 chronic kidney disease, or unspecified chronic kidney disease: Secondary | ICD-10-CM | POA: Diagnosis not present

## 2024-01-20 DIAGNOSIS — M25561 Pain in right knee: Secondary | ICD-10-CM | POA: Diagnosis not present

## 2024-01-20 DIAGNOSIS — M6284 Sarcopenia: Secondary | ICD-10-CM | POA: Diagnosis not present

## 2024-01-20 DIAGNOSIS — M3500 Sicca syndrome, unspecified: Secondary | ICD-10-CM | POA: Diagnosis not present

## 2024-01-20 DIAGNOSIS — N182 Chronic kidney disease, stage 2 (mild): Secondary | ICD-10-CM | POA: Diagnosis not present

## 2024-01-22 DIAGNOSIS — R54 Age-related physical debility: Secondary | ICD-10-CM | POA: Diagnosis not present

## 2024-01-22 DIAGNOSIS — I10 Essential (primary) hypertension: Secondary | ICD-10-CM | POA: Diagnosis not present

## 2024-01-22 DIAGNOSIS — Z9181 History of falling: Secondary | ICD-10-CM | POA: Diagnosis not present

## 2024-01-22 DIAGNOSIS — M6281 Muscle weakness (generalized): Secondary | ICD-10-CM | POA: Diagnosis not present

## 2024-01-22 DIAGNOSIS — N182 Chronic kidney disease, stage 2 (mild): Secondary | ICD-10-CM | POA: Diagnosis not present

## 2024-01-23 DIAGNOSIS — L97211 Non-pressure chronic ulcer of right calf limited to breakdown of skin: Secondary | ICD-10-CM | POA: Diagnosis not present

## 2024-02-01 DIAGNOSIS — R2689 Other abnormalities of gait and mobility: Secondary | ICD-10-CM | POA: Diagnosis not present

## 2024-02-01 DIAGNOSIS — Z9181 History of falling: Secondary | ICD-10-CM | POA: Diagnosis not present

## 2024-02-01 DIAGNOSIS — G8929 Other chronic pain: Secondary | ICD-10-CM | POA: Diagnosis not present

## 2024-02-01 DIAGNOSIS — M6281 Muscle weakness (generalized): Secondary | ICD-10-CM | POA: Diagnosis not present

## 2024-02-01 DIAGNOSIS — S72142D Displaced intertrochanteric fracture of left femur, subsequent encounter for closed fracture with routine healing: Secondary | ICD-10-CM | POA: Diagnosis not present

## 2024-02-01 DIAGNOSIS — R262 Difficulty in walking, not elsewhere classified: Secondary | ICD-10-CM | POA: Diagnosis not present

## 2024-02-01 DIAGNOSIS — R2681 Unsteadiness on feet: Secondary | ICD-10-CM | POA: Diagnosis not present

## 2024-02-06 DIAGNOSIS — K219 Gastro-esophageal reflux disease without esophagitis: Secondary | ICD-10-CM | POA: Diagnosis not present

## 2024-02-06 DIAGNOSIS — M3500 Sicca syndrome, unspecified: Secondary | ICD-10-CM | POA: Diagnosis not present

## 2024-02-06 DIAGNOSIS — I10 Essential (primary) hypertension: Secondary | ICD-10-CM | POA: Diagnosis not present

## 2024-02-11 ENCOUNTER — Ambulatory Visit: Admitting: Dermatology

## 2024-02-11 ENCOUNTER — Encounter: Payer: Self-pay | Admitting: Dermatology

## 2024-02-11 VITALS — BP 131/71

## 2024-02-11 DIAGNOSIS — D692 Other nonthrombocytopenic purpura: Secondary | ICD-10-CM | POA: Diagnosis not present

## 2024-02-11 DIAGNOSIS — T148XXA Other injury of unspecified body region, initial encounter: Secondary | ICD-10-CM

## 2024-02-11 DIAGNOSIS — R600 Localized edema: Secondary | ICD-10-CM | POA: Diagnosis not present

## 2024-02-11 DIAGNOSIS — L570 Actinic keratosis: Secondary | ICD-10-CM | POA: Diagnosis not present

## 2024-02-11 NOTE — Progress Notes (Signed)
 New Patient Visit   Subjective  Lauren Davidson is a 89 y.o. female, accompanied by her son in law, who presents for a NEW PATIENT appointment to be examined for the concerns as listed below.   Skin Breakdown: Patient had a fall in January that resulted in a big bruise on the R lower leg which has not fully resolved through out the year. Pt has only been applying lotion or A&D ointment on the area. She has not had any Rx topicals to apply and here to seek guidance.    No Hx of Bx. No family Hx of skin cancer.   The following portions of the chart were reviewed this encounter and updated as appropriate: medications, allergies, medical history  Review of Systems:  No other skin or systemic complaints except as noted in HPI or Assessment and Plan.  Objective  Well appearing patient in no apparent distress; mood and affect are within normal limits.   A focused examination was performed of the following areas: R leg   Relevant exam findings are noted in the Assessment and Plan.         Assessment & Plan    1. Post-traumatic Hematoma and Abrasion of the Leg - Assessment: Patient sustained a fall on January 31st resulting in a hematoma and abrasion on the leg. The initial abrasion developed into a large scab, which has recently detached. Examination reveals a healing abrasion with no signs of infection or complications. The wound has healed well, indicating good healing capacity despite advanced age. - Plan:    Apply DermMend cream to the affected area, followed by CeraVe moisturizer    Provided samples of CeraVe products for skincare    Follow up as needed  2. Chronic Lower Extremity Edema - Assessment: Patient reports a history of chronic lower extremity edema, managed with support stockings for several years. Examination reveals +2 pitting edema in the right lower extremity. The edema initially began in the left leg. The chronic nature and bilateral presentation suggest a  possible underlying vascular or systemic cause. - Plan:    Continue use of support stockings for edema management    Monitor edema during follow-up visits  3. Age-related Skin Changes and Purpura - Assessment: Patient exhibits signs of age-related skin changes, including thinning skin and fragile blood vessels. This has resulted in easy bruising (purpura) from minor trauma. These changes are consistent with the patient's advanced age and increase the risk of skin injuries. - Plan:    Apply DermMend cream to affected areas to help with bruising and skin thickening    Apply CeraVe moisturizer on top of DermMend    Educate patient on the long-term nature of treatment for noticeable improvement    Encourage daily use as part of skincare routine  4. Actinic Keratosis - Assessment: Two small growths were identified during the examination, clinically consistent with actinic keratosis. These pre-cancerous lesions are likely due to cumulative sun exposure over the patient's lifetime. While they pose a low immediate risk, they have the potential for malignant transformation over many years. - Plan:    Offered cryotherapy with liquid nitrogen for lesion removal    Discussed option to observe if lesions are not bothersome    Educated patient on the slow progression of actinic keratosis    Monitor lesions during future visits   Return if symptoms worsen or fail to improve.   Documentation: I have reviewed the above documentation for accuracy and completeness, and I agree with  the above.  I, Shirron Maranda, CMA, am acting as scribe for [employer], DO.   Delon Lenis, DO

## 2024-02-11 NOTE — Patient Instructions (Addendum)
 Date: Wed Feb 11 2024  Hello Erva,  Thank you for visiting today. Here is a summary of the key instructions:  - Medications:   - Apply DermMend cream first, then apply CeraVe on top   - Continue wearing support stockings for swelling  - Skin Care:   - DermMend contains ceramides to keep skin hydrated, Arnica to help bruising fade faster, and retinol to stimulate collagen and thicken skin   - The cream takes a couple of months to start working   - Retinol takes 6 to 8 months to build collagen and notice a difference   - Only works while you use it, so it will be part of your everyday routine  - Treatment Areas:   - Pre-cancerous growths can be left alone if they don't bother you   - If you want treatment, they can be frozen with liquid nitrogen   - Freezing stings a little bit and the area will get crusty and fall off in about 2-3 weeks  - Follow-up:   - Follow up as needed  - Other Instructions:   - CeraVe samples provided (face washes, lotions, and creams)   - Picture of DermMend cream for reference   - Can send messages through MyChart for questions  Please reach out if you have any questions or concerns.  Warm regards,  Dr. [facility]     Important Information  Due to recent changes in healthcare laws, you may see results of your pathology and/or laboratory studies on MyChart before the doctors have had a chance to review them. We understand that in some cases there may be results that are confusing or concerning to you. Please understand that not all results are received at the same time and often the doctors may need to interpret multiple results in order to provide you with the best plan of care or course of treatment. Therefore, we ask that you please give us  2 business days to thoroughly review all your results before contacting the office for clarification. Should we see a critical lab result, you will be contacted sooner.   If You Need Anything  After Your Visit  If you have any questions or concerns for your doctor, please call our main line at [phone] If no one answers, please leave a voicemail as directed and we will return your call as soon as possible. Messages left after 4 pm will be answered the following business day.   You may also send us  a message via MyChart. We typically respond to MyChart messages within 1-2 business days.  For prescription refills, please ask your pharmacy to contact our office. Our fax number is [phone].  If you have an urgent issue when the clinic is closed that cannot wait until the next business day, you can page your doctor at the number below.    Please note that while we do our best to be available for urgent issues outside of office hours, we are not available 24/7.   If you have an urgent issue and are unable to reach us , you may choose to seek medical care at your doctor's office, retail clinic, urgent care center, or emergency room.  If you have a medical emergency, please immediately call 911 or go to the emergency department. In the event of inclement weather, please call our main line at [phone] for an update on the status of any delays or closures.  Dermatology Medication Tips: Please keep the boxes that topical medications  come in in order to help keep track of the instructions about where and how to use these. Pharmacies typically print the medication instructions only on the boxes and not directly on the medication tubes.   If your medication is too expensive, please contact our office at [phone] or send us  a message through MyChart.   We are unable to tell what your co-pay for medications will be in advance as this is different depending on your insurance coverage. However, we may be able to find a substitute medication at lower cost or fill out paperwork to get insurance to cover a needed medication.   If a prior authorization is required to get your medication  covered by your insurance company, please allow us  1-2 business days to complete this process.  Drug prices often vary depending on where the prescription is filled and some pharmacies may offer cheaper prices.  The website www.goodrx.com contains coupons for medications through different pharmacies. The prices here do not account for what the cost may be with help from insurance (it may be cheaper with your insurance), but the website can give you the price if you did not use any insurance.  - You can print the associated coupon and take it with your prescription to the pharmacy.  - You may also stop by our office during regular business hours and pick up a GoodRx coupon card.  - If you need your prescription sent electronically to a different pharmacy, notify our office through [facility] or by phone at [phone]

## 2024-02-12 DIAGNOSIS — N189 Chronic kidney disease, unspecified: Secondary | ICD-10-CM | POA: Diagnosis not present

## 2024-02-12 DIAGNOSIS — I10 Essential (primary) hypertension: Secondary | ICD-10-CM | POA: Diagnosis not present

## 2024-02-13 DIAGNOSIS — M545 Low back pain, unspecified: Secondary | ICD-10-CM | POA: Diagnosis not present

## 2024-02-13 DIAGNOSIS — R5383 Other fatigue: Secondary | ICD-10-CM | POA: Diagnosis not present

## 2024-02-14 DIAGNOSIS — R5382 Chronic fatigue, unspecified: Secondary | ICD-10-CM | POA: Diagnosis not present

## 2024-02-17 ENCOUNTER — Observation Stay (HOSPITAL_COMMUNITY)

## 2024-02-17 ENCOUNTER — Other Ambulatory Visit: Payer: Self-pay

## 2024-02-17 ENCOUNTER — Inpatient Hospital Stay (HOSPITAL_COMMUNITY)
Admission: EM | Admit: 2024-02-17 | Discharge: 2024-02-23 | DRG: 291 | Disposition: A | Discharge status: Skilled Nursing Facility | Attending: Internal Medicine | Admitting: Internal Medicine

## 2024-02-17 ENCOUNTER — Emergency Department (HOSPITAL_COMMUNITY)

## 2024-02-17 ENCOUNTER — Encounter (HOSPITAL_COMMUNITY): Payer: Self-pay | Admitting: Internal Medicine

## 2024-02-17 DIAGNOSIS — Z23 Encounter for immunization: Secondary | ICD-10-CM

## 2024-02-17 DIAGNOSIS — I110 Hypertensive heart disease with heart failure: Secondary | ICD-10-CM | POA: Diagnosis not present

## 2024-02-17 DIAGNOSIS — J9811 Atelectasis: Secondary | ICD-10-CM | POA: Diagnosis present

## 2024-02-17 DIAGNOSIS — Z885 Allergy status to narcotic agent status: Secondary | ICD-10-CM

## 2024-02-17 DIAGNOSIS — R531 Weakness: Secondary | ICD-10-CM | POA: Diagnosis present

## 2024-02-17 DIAGNOSIS — I5031 Acute diastolic (congestive) heart failure: Secondary | ICD-10-CM | POA: Diagnosis not present

## 2024-02-17 DIAGNOSIS — I517 Cardiomegaly: Secondary | ICD-10-CM | POA: Diagnosis not present

## 2024-02-17 DIAGNOSIS — Z79899 Other long term (current) drug therapy: Secondary | ICD-10-CM

## 2024-02-17 DIAGNOSIS — J40 Bronchitis, not specified as acute or chronic: Secondary | ICD-10-CM | POA: Diagnosis present

## 2024-02-17 DIAGNOSIS — J9601 Acute respiratory failure with hypoxia: Secondary | ICD-10-CM | POA: Diagnosis not present

## 2024-02-17 DIAGNOSIS — R918 Other nonspecific abnormal finding of lung field: Secondary | ICD-10-CM | POA: Diagnosis not present

## 2024-02-17 DIAGNOSIS — R0602 Shortness of breath: Secondary | ICD-10-CM | POA: Diagnosis not present

## 2024-02-17 DIAGNOSIS — G8929 Other chronic pain: Secondary | ICD-10-CM | POA: Diagnosis present

## 2024-02-17 DIAGNOSIS — I5033 Acute on chronic diastolic (congestive) heart failure: Secondary | ICD-10-CM | POA: Diagnosis present

## 2024-02-17 DIAGNOSIS — I509 Heart failure, unspecified: Secondary | ICD-10-CM

## 2024-02-17 DIAGNOSIS — E785 Hyperlipidemia, unspecified: Secondary | ICD-10-CM | POA: Diagnosis present

## 2024-02-17 DIAGNOSIS — I081 Rheumatic disorders of both mitral and tricuspid valves: Secondary | ICD-10-CM | POA: Diagnosis present

## 2024-02-17 DIAGNOSIS — R0989 Other specified symptoms and signs involving the circulatory and respiratory systems: Secondary | ICD-10-CM | POA: Diagnosis not present

## 2024-02-17 DIAGNOSIS — R140 Abdominal distension (gaseous): Secondary | ICD-10-CM | POA: Diagnosis not present

## 2024-02-17 DIAGNOSIS — Z66 Do not resuscitate: Secondary | ICD-10-CM | POA: Diagnosis present

## 2024-02-17 DIAGNOSIS — M3500 Sicca syndrome, unspecified: Secondary | ICD-10-CM | POA: Diagnosis present

## 2024-02-17 DIAGNOSIS — Z515 Encounter for palliative care: Secondary | ICD-10-CM

## 2024-02-17 DIAGNOSIS — Z1152 Encounter for screening for COVID-19: Secondary | ICD-10-CM

## 2024-02-17 DIAGNOSIS — M549 Dorsalgia, unspecified: Secondary | ICD-10-CM | POA: Diagnosis present

## 2024-02-17 DIAGNOSIS — R748 Abnormal levels of other serum enzymes: Secondary | ICD-10-CM | POA: Diagnosis present

## 2024-02-17 DIAGNOSIS — H9190 Unspecified hearing loss, unspecified ear: Secondary | ICD-10-CM | POA: Diagnosis present

## 2024-02-17 DIAGNOSIS — Z888 Allergy status to other drugs, medicaments and biological substances status: Secondary | ICD-10-CM

## 2024-02-17 LAB — CBC WITH DIFFERENTIAL/PLATELET
Abs Immature Granulocytes: 0.02 K/uL (ref 0.00–0.07)
Basophils Absolute: 0 K/uL (ref 0.0–0.1)
Basophils Relative: 1 %
Eosinophils Absolute: 0.1 K/uL (ref 0.0–0.5)
Eosinophils Relative: 3 %
HCT: 37.4 % (ref 36.0–46.0)
Hemoglobin: 12.2 g/dL (ref 12.0–15.0)
Immature Granulocytes: 1 %
Lymphocytes Relative: 20 %
Lymphs Abs: 0.8 K/uL (ref 0.7–4.0)
MCH: 33 pg (ref 26.0–34.0)
MCHC: 32.6 g/dL (ref 30.0–36.0)
MCV: 101.1 fL — ABNORMAL HIGH (ref 80.0–100.0)
Monocytes Absolute: 0.5 K/uL (ref 0.1–1.0)
Monocytes Relative: 12 %
Neutro Abs: 2.6 K/uL (ref 1.7–7.7)
Neutrophils Relative %: 63 %
Platelets: 241 K/uL (ref 150–400)
RBC: 3.7 MIL/uL — ABNORMAL LOW (ref 3.87–5.11)
RDW: 14.1 % (ref 11.5–15.5)
WBC: 4.1 K/uL (ref 4.0–10.5)
nRBC: 0 % (ref 0.0–0.2)

## 2024-02-17 LAB — COMPREHENSIVE METABOLIC PANEL WITH GFR
ALT: 15 U/L (ref 0–44)
AST: 34 U/L (ref 15–41)
Albumin: 4 g/dL (ref 3.5–5.0)
Alkaline Phosphatase: 353 U/L — ABNORMAL HIGH (ref 38–126)
Anion gap: 12 (ref 5–15)
BUN: 16 mg/dL (ref 8–23)
CO2: 25 mmol/L (ref 22–32)
Calcium: 9.4 mg/dL (ref 8.9–10.3)
Chloride: 98 mmol/L (ref 98–111)
Creatinine, Ser: 0.59 mg/dL (ref 0.44–1.00)
GFR, Estimated: >60 mL/min (ref >60)
Glucose, Bld: 86 mg/dL (ref 70–99)
Potassium: 4.3 mmol/L (ref 3.5–5.1)
Sodium: 134 mmol/L — ABNORMAL LOW (ref 135–145)
Total Bilirubin: 0.6 mg/dL (ref 0.0–1.2)
Total Protein: 7.5 g/dL (ref 6.5–8.1)

## 2024-02-17 LAB — RESP PANEL BY RT-PCR (RSV, FLU A&B, COVID)  RVPGX2
Influenza A by PCR: NEGATIVE
Influenza B by PCR: NEGATIVE
Resp Syncytial Virus by PCR: NEGATIVE
SARS Coronavirus 2 by RT PCR: NEGATIVE

## 2024-02-17 LAB — GAMMA GT: GGT: 11 U/L (ref 7–50)

## 2024-02-17 LAB — PRO BRAIN NATRIURETIC PEPTIDE: Pro Brain Natriuretic Peptide: 976 pg/mL — ABNORMAL HIGH (ref <300.0)

## 2024-02-17 LAB — TROPONIN T, HIGH SENSITIVITY
Troponin T High Sensitivity: 25 ng/L — ABNORMAL HIGH (ref 0–19)
Troponin T High Sensitivity: 26 ng/L — ABNORMAL HIGH (ref 0–19)

## 2024-02-17 LAB — D-DIMER, QUANTITATIVE: D-Dimer, Quant: 3.28 ug{FEU}/mL — ABNORMAL HIGH (ref 0.00–0.50)

## 2024-02-17 LAB — I-STAT CG4 LACTIC ACID, ED: Lactic Acid, Venous: 0.7 mmol/L (ref 0.5–1.9)

## 2024-02-17 MED ORDER — ALBUTEROL SULFATE (2.5 MG/3ML) 0.083% IN NEBU: 2.5000 mg | INHALATION_SOLUTION | RESPIRATORY_TRACT | Status: DC | PRN

## 2024-02-17 MED ORDER — IOHEXOL 350 MG/ML SOLN
75.0000 mL | Freq: Once | INTRAVENOUS | Status: AC | PRN
  Administered 2024-02-17: 75 mL via INTRAVENOUS

## 2024-02-17 MED ORDER — ONDANSETRON HCL 4 MG/2ML IJ SOLN: 4.0000 mg | Freq: Four times a day (QID) | INTRAMUSCULAR | Status: DC | PRN

## 2024-02-17 MED ORDER — INFLUENZA VAC SPLIT HIGH-DOSE 0.5 ML IM SUSY
0.5000 mL | PREFILLED_SYRINGE | INTRAMUSCULAR | Status: AC
  Administered 2024-02-18: 0.5 mL via INTRAMUSCULAR
  Filled 2024-02-17 (×2): qty 0.5

## 2024-02-17 MED ORDER — ENOXAPARIN SODIUM 40 MG/0.4ML IJ SOSY
40.0000 mg | PREFILLED_SYRINGE | INTRAMUSCULAR | Status: DC
  Administered 2024-02-17 – 2024-02-23 (×7): 40 mg via SUBCUTANEOUS
  Filled 2024-02-17 (×7): qty 0.4

## 2024-02-17 MED ORDER — FUROSEMIDE 10 MG/ML IJ SOLN
20.0000 mg | Freq: Once | INTRAMUSCULAR | Status: AC
  Administered 2024-02-17: 20 mg via INTRAVENOUS
  Filled 2024-02-17: qty 4

## 2024-02-17 MED ORDER — TRAZODONE HCL 50 MG PO TABS
25.0000 mg | ORAL_TABLET | Freq: Every evening | ORAL | Status: DC | PRN
  Filled 2024-02-17: qty 1

## 2024-02-17 MED ORDER — ACETAMINOPHEN 325 MG PO TABS
650.0000 mg | ORAL_TABLET | Freq: Four times a day (QID) | ORAL | Status: DC | PRN
  Administered 2024-02-19: 650 mg via ORAL
  Filled 2024-02-17: qty 2

## 2024-02-17 MED ORDER — OXYCODONE HCL 5 MG PO TABS
2.5000 mg | ORAL_TABLET | ORAL | Status: DC | PRN
  Administered 2024-02-19: 2.5 mg via ORAL
  Filled 2024-02-17: qty 1

## 2024-02-17 MED ORDER — ACETAMINOPHEN 650 MG RE SUPP: 650.0000 mg | Freq: Four times a day (QID) | RECTAL | Status: DC | PRN

## 2024-02-17 MED ORDER — ONDANSETRON HCL 4 MG PO TABS: 4.0000 mg | ORAL_TABLET | Freq: Four times a day (QID) | ORAL | Status: DC | PRN

## 2024-02-17 MED ORDER — PANTOPRAZOLE SODIUM 40 MG PO TBEC
40.0000 mg | DELAYED_RELEASE_TABLET | Freq: Every day | ORAL | Status: DC
  Administered 2024-02-18 – 2024-02-23 (×6): 40 mg via ORAL
  Filled 2024-02-17 (×7): qty 1

## 2024-02-17 MED ORDER — AMLODIPINE BESYLATE 5 MG PO TABS
5.0000 mg | ORAL_TABLET | Freq: Every day | ORAL | Status: DC
Start: 2024-02-17 — End: 2024-02-19
  Administered 2024-02-17 – 2024-02-18 (×2): 5 mg via ORAL
  Filled 2024-02-17 (×2): qty 1

## 2024-02-17 MED ORDER — KETOROLAC TROMETHAMINE 15 MG/ML IJ SOLN
15.0000 mg | Freq: Once | INTRAMUSCULAR | Status: AC
  Administered 2024-02-17: 15 mg via INTRAVENOUS
  Filled 2024-02-17: qty 1

## 2024-02-17 NOTE — ED Notes (Signed)
 Entire bed linen changed as well as patient changed into a gown. Sat patient up for dinner but all she wanted was the soup. Warmed it up and put in a cup.

## 2024-02-17 NOTE — ED Triage Notes (Signed)
 Pt BIBA from [facility] for shortness of breath this morning. Initial RA 82-87%. 94% on 4L. No baseline O2. Lung sounds clear. Staff report a little weaker than normal, pt states she has been tired since before her birthday.  97.7 Cbg 102 Hr 70s

## 2024-02-17 NOTE — ED Notes (Signed)
 Pt is waiting for CT then we will transport her upstairs

## 2024-02-17 NOTE — ED Provider Notes (Signed)
 [facility] AT [facility] Provider Note   CSN: [id number] Arrival date & time: 02/17/24  9156     Patient presents with: Shortness of Breath   Lauren Davidson is a 89 y.o. female.   89 yo F with a chief complaint of difficulty breathing.  She tells me has been off and on for a while.  Says she slept well last night.  Woke up with some worsening breathing.  Coughs at times.  Denies fevers denies chest pain or pressure.  Per EMS patient was tachypneic found to have oxygen saturation in the mid to upper 80s placed on oxygen with some improvement.   Shortness of Breath      Prior to Admission medications   Medication Sig Start Date End Date Taking? Authorizing Provider  acetaminophen  (TYLENOL ) 500 MG tablet Take 500 mg by mouth every 6 (six) hours as needed (for pain).   Yes [provider]  amLODipine  (NORVASC ) 5 MG tablet Take 1 tablet (5 mg total) by mouth daily. 06/26/22  Yes Drusilla Sabas RAMAN, MD  Calcium  Carb-Cholecalciferol  (CALCIUM +D3 PO) Take 1 tablet by mouth daily.   Yes [provider]  Dextran 70-Hypromellose (GENTEAL TEARS) 0.1-0.3 % SOLN Place 1 drop into both eyes every 12 (twelve) hours as needed (dry eyes).   Yes [provider]  docusate sodium  (COLACE) 100 MG capsule Take 1 capsule (100 mg total) by mouth 2 (two) times daily. 09/20/21  Yes Cindy Garnette POUR, MD  ferrous gluconate  [facility]) 324 MG tablet Take 324 mg by mouth daily with breakfast.   Yes [provider]  ibuprofen  (ADVIL ) 200 MG tablet Take 200 mg by mouth every 6 (six) hours as needed for moderate pain (pain score 4-6) or mild pain (pain score 1-3).   Yes [provider]  lidocaine  4 % Place 1 patch onto the skin daily.   Yes [provider]  Multiple Vitamins-Minerals (PRESERVISION AREDS 2) CAPS Take 1 capsule by mouth in the morning and at bedtime.   Yes [provider]  pantoprazole  (PROTONIX ) 40 MG tablet Take 40  mg by mouth every morning. 04/27/23  Yes [provider]  oxycodone  (OXY-IR) 5 MG capsule Take 1 capsule (5 mg total) by mouth every 4 (four) hours as needed for pain. Patient not taking: Reported on 02/17/2024 10/22/23   Laurence Locus, DO    Allergies: Statins, Buprenorphine hcl, Buspirone, Lexapro  [escitalopram ], Meperidine, and Morphine and codeine    Review of Systems  Respiratory:  Positive for shortness of breath.     Updated Vital Signs BP (!) 146/60   Pulse 70   Temp 98 F (36.7 C) (Oral)   Resp (!) 22   Ht 5' 5 (1.651 m)   Wt 54.4 kg   SpO2 94%   BMI 19.97 kg/m   Physical Exam Vitals and nursing note reviewed.  Constitutional:      General: She is not in acute distress.    Appearance: She is well-developed. She is not diaphoretic.  HENT:     Head: Normocephalic and atraumatic.  Eyes:     Pupils: Pupils are equal, round, and reactive to light.  Cardiovascular:     Rate and Rhythm: Normal rate and regular rhythm.     Heart sounds: Murmur (systolic ejection murmur best heard over the mitral pole) heard.     No friction rub. No gallop.  Pulmonary:     Effort: Pulmonary effort is normal.     Breath  sounds: Decreased breath sounds present. No wheezing or rales.  Abdominal:     General: There is no distension.     Palpations: Abdomen is soft.     Tenderness: There is no abdominal tenderness.  Musculoskeletal:        General: No tenderness.     Cervical back: Normal range of motion and neck supple.     Right lower leg: Edema present.     Left lower leg: Edema present.     Comments: Trace edema bilaterally  Skin:    General: Skin is warm and dry.  Neurological:     Mental Status: She is alert and oriented to person, place, and time.  Psychiatric:        Behavior: Behavior normal.     (all labs ordered are listed, but only abnormal results are displayed) Labs Reviewed  CBC WITH DIFFERENTIAL/PLATELET - Abnormal; Notable for the following components:       Result Value   RBC 3.70 (*)    MCV 101.1 (*)    All other components within normal limits  COMPREHENSIVE METABOLIC PANEL WITH GFR - Abnormal; Notable for the following components:   Sodium 134 (*)    Alkaline Phosphatase 353 (*)    All other components within normal limits  PRO BRAIN NATRIURETIC PEPTIDE - Abnormal; Notable for the following components:   Pro Brain Natriuretic Peptide 976.0 (*)    All other components within normal limits  TROPONIN T, HIGH SENSITIVITY - Abnormal; Notable for the following components:   Troponin T High Sensitivity 25 (*)    All other components within normal limits  TROPONIN T, HIGH SENSITIVITY - Abnormal; Notable for the following components:   Troponin T High Sensitivity 26 (*)    All other components within normal limits  RESP PANEL BY RT-PCR (RSV, FLU A&B, COVID)  RVPGX2  CULTURE, BLOOD (ROUTINE X 2)  CULTURE, BLOOD (ROUTINE X 2)  D-DIMER, QUANTITATIVE  GAMMA GT  I-STAT CG4 LACTIC ACID, ED    EKG: EKG Interpretation Date/Time:  Tuesday February 17 2024 09:07:54 EDT Ventricular Rate:  77 PR Interval:  220 QRS Duration:  145 QT Interval:  510 QTC Calculation: 578 R Axis:   91  Text Interpretation: Sinus rhythm Ventricular premature complex Prolonged PR interval Consider left atrial enlargement RBBB and LPFB Otherwise no significant change Confirmed by Emil Share [phone]) on 02/17/2024 9:12:16 AM  Radiology: [facility] Chest Portable 1 View Result Date: 02/17/2024 CLINICAL DATA:  One 89 year old female with shortness of breath. EXAM: PORTABLE CHEST 1 VIEW COMPARISON:  Chest CT 10/16/2023 and earlier. FINDINGS: Portable AP semi upright view at 0926 hours. Stable cardiomegaly since May. Calcified aortic atherosclerosis. Other mediastinal contours are within normal limits. Visualized tracheal air column is within normal limits. Stable lung volumes. Small pleural effusions at that time likely persist. Increased generalized pulmonary vascular congestion  from previous exams. No pneumothorax. No consolidation. Stable visualized osseous structures. Multilevel previous spinal compression fractures and augmentation. Paucity of bowel gas the visible abdomen. IMPRESSION: 1. Chronic cardiomegaly with suspected acute pulmonary interstitial edema. Viral/atypical respiratory infection felt less likely. Small chronic pleural effusions suspected. 2. Chronic compression fractures and augmentation. Electronically Signed   By: VEAR Hurst M.D.   On: 02/17/2024 09:49     .Critical Care  Performed by: Emil Share, DO Authorized by: Emil Share, DO   Critical care provider statement:    Critical care time (minutes):  35   Critical care time was exclusive of:  Separately billable procedures and treating other patients   Critical care was time spent personally by me on the following activities:  Development of treatment plan with patient or surrogate, discussions with consultants, evaluation of patient's response to treatment, examination of patient, ordering and review of laboratory studies, ordering and review of radiographic studies, ordering and performing treatments and interventions, pulse oximetry, re-evaluation of patient's condition and review of old charts   Care discussed with: admitting provider      Medications Ordered in the ED  amLODipine  (NORVASC ) tablet 5 mg (has no administration in time range)  pantoprazole  (PROTONIX ) EC tablet 40 mg (has no administration in time range)  enoxaparin  (LOVENOX ) injection 40 mg (has no administration in time range)  acetaminophen  (TYLENOL ) tablet 650 mg (has no administration in time range)    Or  acetaminophen  (TYLENOL ) suppository 650 mg (has no administration in time range)  traZODone (DESYREL) tablet 25 mg (has no administration in time range)  ondansetron  (ZOFRAN ) tablet 4 mg (has no administration in time range)    Or  ondansetron  (ZOFRAN ) injection 4 mg (has no administration in time range)  albuterol  (PROVENTIL) (2.5 MG/3ML) 0.083% nebulizer solution 2.5 mg (has no administration in time range)  oxyCODONE  (Oxy IR/ROXICODONE ) immediate release tablet 2.5 mg (has no administration in time range)  furosemide (LASIX) injection 20 mg (20 mg Intravenous Given 02/17/24 1228)  ketorolac (TORADOL) 15 MG/ML injection 15 mg (15 mg Intravenous Given 02/17/24 1228)                                    Medical Decision Making Amount and/or Complexity of Data Reviewed Labs: ordered. Radiology: ordered.  Risk Prescription drug management. Decision regarding hospitalization.   89 yo F with a chief complaints of difficulty breathing.  She tells me has been going on for some time but worse this morning.  Hypoxic with EMS hypoxic here.  Doing well on 2 L of nasal cannula.  Will obtain a chest x-ray blood work reassess.  No leukocytosis no anemia lactate normal.  No significant electrolyte abnormalities.  Chest x-ray on my independent interpretation with coarse lung findings looks similar to prior.  Radiology read with perhaps viral syndrome versus edema.  BNP in an indeterminate range.  Troponin mildly elevated.  Will discuss with medicine.  The patients results and plan were reviewed and discussed.   Any x-rays performed were independently reviewed by myself.   Differential diagnosis were considered with the presenting HPI.  Medications  amLODipine  (NORVASC ) tablet 5 mg (has no administration in time range)  pantoprazole  (PROTONIX ) EC tablet 40 mg (has no administration in time range)  enoxaparin  (LOVENOX ) injection 40 mg (has no administration in time range)  acetaminophen  (TYLENOL ) tablet 650 mg (has no administration in time range)    Or  acetaminophen  (TYLENOL ) suppository 650 mg (has no administration in time range)  traZODone (DESYREL) tablet 25 mg (has no administration in time range)  ondansetron  (ZOFRAN ) tablet 4 mg (has no administration in time range)    Or  ondansetron   (ZOFRAN ) injection 4 mg (has no administration in time range)  albuterol (PROVENTIL) (2.5 MG/3ML) 0.083% nebulizer solution 2.5 mg (has no administration in time range)  oxyCODONE  (Oxy IR/ROXICODONE ) immediate release tablet 2.5 mg (has no administration in time range)  furosemide (LASIX) injection 20 mg (20 mg Intravenous Given 02/17/24 1228)  ketorolac (TORADOL) 15 MG/ML injection 15 mg (15 mg  Intravenous Given 02/17/24 1228)    Vitals:   02/17/24 0854 02/17/24 1030  BP: (!) 149/68 (!) 146/60  Pulse: 77 70  Resp: (!) 22 (!) 22  Temp: 98 F (36.7 C)   TempSrc: Oral   SpO2: (!) 89% 94%  Weight: 54.4 kg   Height: 5' 5 (1.651 m)     Final diagnoses:  Acute respiratory failure with hypoxia (HCC)    Admission/ observation were discussed with the admitting physician, patient and/or family and they are comfortable with the plan.       Final diagnoses:  Acute respiratory failure with hypoxia [facility])    ED Discharge Orders     None          Emil Share, DO 02/17/24 1350

## 2024-02-17 NOTE — H&P (Addendum)
 History and Physical  [name]:[id number] DOB: January 01, 1922 DOA: 02/17/2024  PCP: Dwight Trula SQUIBB, MD   Chief Complaint: Shortness of breath  HPI: Lauren Davidson is a 89 y.o. female with medical history significant for heart failure with preserved EF, lumbar compression fractures, hyperlipidemia being admitted to the hospital with acute hypoxic respiratory failure likely due to acute heart failure with preserved EF.  She just recently celebrated her 89th birthday, her daughter and son-in-law who are at the bedside as well as the patient tell me that she has been feeling gradually short of breath since that time.  She has had a nonproductive cough, denies any chest pain, fevers, nausea, vomiting.  She also has been having worsening back pain, though her daughter tells me that this is chronic for her.  On further discussion with her family, her daughter tells me that she has bilateral leg swelling which is worse than her baseline.  Review of Systems: Please see HPI for pertinent positives and negatives. A complete 10 system review of systems are otherwise negative.  Past Medical History:  Diagnosis Date   Closed compression fracture of L2 lumbar vertebra, initial encounter (HCC) 06/16/2022   Closed compression fracture of L4 lumbar vertebra, initial encounter (HCC) 09/17/2021   Closed displaced intertrochanteric fracture of left femur, initial encounter (HCC) 05/10/2023   Hyperlipidemia    Lumbar compression fracture (HCC) 06/12/2022   Past Surgical History:  Procedure Laterality Date   FEMUR IM NAIL Left 05/11/2023   Procedure: INTRAMEDULLARY (IM) NAIL FEMORAL;  Surgeon: Barton Drape, MD;  Location: [facility];  Service: Orthopedics;  Laterality: Left;   IR KYPHO LUMBAR INC FX REDUCE BONE BX UNI/BIL CANNULATION INC/IMAGING  06/20/2022   IR KYPHO LUMBAR INC FX REDUCE BONE BX UNI/BIL CANNULATION INC/IMAGING  09/26/2022   IR KYPHO THORACIC WITH BONE BIOPSY  09/26/2022   Social History:   reports that she has never smoked. She has never used smokeless tobacco. She reports that she does not drink alcohol  and does not use drugs.  Allergies  Allergen Reactions   Statins Other (See Comments)    Muscle weakness and pain   Buprenorphine Hcl Other (See Comments)    Patient questioned this entry   Buspirone Other (See Comments)    Weakness    Lexapro  [Escitalopram ] Other (See Comments)    Muscle problems   Meperidine Other (See Comments)    Demerol- questionable allergy- reaction not recalled   Morphine And Codeine Rash    No family history on file.   Prior to Admission medications   Medication Sig Start Date End Date Taking? Authorizing Provider  acetaminophen  (TYLENOL ) 500 MG tablet Take 500 mg by mouth every 6 (six) hours as needed (for pain).    [provider]  amLODipine  (NORVASC ) 5 MG tablet Take 1 tablet (5 mg total) by mouth daily. 06/26/22   Drusilla Sabas RAMAN, MD  Calcium  Carb-Cholecalciferol  (CALCIUM +D3 PO) Take 1 tablet by mouth daily with breakfast.    [provider]  docusate sodium  (COLACE) 100 MG capsule Take 1 capsule (100 mg total) by mouth 2 (two) times daily. 09/20/21   Cindy Garnette POUR, MD  ibuprofen  (ADVIL ) 200 MG tablet Take 200 mg by mouth every 6 (six) hours as needed for moderate pain (pain score 4-6) or mild pain (pain score 1-3).    [provider]  Multiple Vitamins-Minerals (PRESERVISION AREDS 2) CAPS Take 1 capsule by mouth in the morning and at bedtime.    [provider]  oxycodone  (OXY-IR) 5 MG capsule Take 1 capsule (5 mg total) by mouth every 4 (four) hours as needed for pain. 10/22/23   Laurence Locus, DO  pantoprazole  (PROTONIX ) 40 MG tablet Take 40 mg by mouth every morning. 04/27/23   [provider]  SYSTANE ULTRA PF 0.4-0.3 % SOLN Place 1 drop into both eyes in the morning, at noon, in the evening, and at bedtime.    [provider]    Physical Exam: BP (!) 146/60   Pulse 70   Temp 98 F  (36.7 C) (Oral)   Resp (!) 22   Ht 5' 5 (1.651 m)   Wt 54.4 kg   SpO2 94%   BMI 19.97 kg/m  General:  Alert, orientedx3, calm, in no acute distress, resting comfortably on 2 L nasal cannula oxygen.  Her daughter and son-in-law are at the bedside.  She is able to speak in full sentences, but very hard of hearing. Cardiovascular: RRR, loud blowing murmur, trace to 1+ pitting bilateral lower extremity edema  Respiratory: clear to auscultation bilaterally, no wheezes, some fine basilar crackles crackles  Abdomen: soft, nontender, nondistended, normal bowel tones heard  Skin: dry, no rashes  Musculoskeletal: no joint effusions, normal range of motion  Psychiatric: appropriate affect, normal speech  Neurologic: extraocular muscles intact, clear speech, moving all extremities with intact sensorium         Labs on Admission:  Basic Metabolic Panel: Recent Labs  Lab 02/17/24 0921  NA 134*  K 4.3  CL 98  CO2 25  GLUCOSE 86  BUN 16  CREATININE 0.59  CALCIUM  9.4   Liver Function Tests: Recent Labs  Lab 02/17/24 0921  AST 34  ALT 15  ALKPHOS 353*  BILITOT 0.6  PROT 7.5  ALBUMIN  4.0   No results for input(s): LIPASE, AMYLASE in the last 168 hours. No results for input(s): AMMONIA in the last 168 hours. CBC: Recent Labs  Lab 02/17/24 0921  WBC 4.1  NEUTROABS 2.6  HGB 12.2  HCT 37.4  MCV 101.1*  PLT 241   Cardiac Enzymes: No results for input(s): CKTOTAL, CKMB, CKMBINDEX, TROPONINI in the last 168 hours. BNP (last 3 results) No results for input(s): BNP in the last 8760 hours.  ProBNP (last 3 results) Recent Labs    02/17/24 0921  PROBNP 976.0*    CBG: No results for input(s): GLUCAP in the last 168 hours.  Radiological Exams on Admission: DG Chest Portable 1 View Result Date: 02/17/2024 CLINICAL DATA:  One 89 year old female with shortness of breath. EXAM: PORTABLE CHEST 1 VIEW COMPARISON:  Chest CT 10/16/2023 and earlier. FINDINGS:  Portable AP semi upright view at 0926 hours. Stable cardiomegaly since May. Calcified aortic atherosclerosis. Other mediastinal contours are within normal limits. Visualized tracheal air column is within normal limits. Stable lung volumes. Small pleural effusions at that time likely persist. Increased generalized pulmonary vascular congestion from previous exams. No pneumothorax. No consolidation. Stable visualized osseous structures. Multilevel previous spinal compression fractures and augmentation. Paucity of bowel gas the visible abdomen. IMPRESSION: 1. Chronic cardiomegaly with suspected acute pulmonary interstitial edema. Viral/atypical respiratory infection felt less likely. Small chronic pleural effusions suspected. 2. Chronic compression fractures and augmentation. Electronically Signed   By: VEAR Hurst M.D.   On: 02/17/2024 09:49   Assessment/Plan  Lauren Davidson is a 89 y.o. female with medical history significant for heart failure with preserved EF, lumbar compression fractures, hyperlipidemia being admitted to the hospital with acute  hypoxic respiratory failure likely due to acute heart failure with preserved EF.   Acute on chronic diastolic congestive heart failure-patient has history of diastolic dysfunction with preserved EF, based on echo done January 2024.  Not on diuretics at baseline.  Presents with elevated BNP, pulmonary interstitial edema, crackles on exam.  Slightly worse than baseline lower extremity edema per family. -Observation admission -Given IV Lasix in the emergency department -May consider repeat echo pending clinical course  Acute hypoxic respiratory failure-without chest pain, significant cough, fevers.  Viral panel is negative.  Given her history of diastolic dysfunction, slightly elevated BNP I suspect this is due to volume overload from heart failure.  Being treated as above. -Continue supplemental oxygen, wean as tolerated -Check D-dimer to rule out VTE (if elevated  will get CT PE study, and lower extremity Dopplers)  Hypertension-continue Norvasc   Back pain-likely due to history of lumbar compression fractures, no recent trauma reported -IV Toradol given in the ER -Low-dose p.o. oxycodone  as needed for pain -PT consult  Elevated alk phos-patient without abdominal symptoms, LFTs unremarkable -Check GGT -Consider further workup thereafter, after shared decision making with family  DVT prophylaxis: Lovenox    DNR/DNI  Consults called: None  Admission status: Observation  Time spent: 49 minutes  Marley Pakula CHRISTELLA Gail MD [facility] Pager [phone]  If 7PM-7AM, please contact night-coverage [email or web address] Password [facility]  02/17/2024, 12:32 PM

## 2024-02-18 DIAGNOSIS — Z515 Encounter for palliative care: Secondary | ICD-10-CM | POA: Diagnosis not present

## 2024-02-18 DIAGNOSIS — Z885 Allergy status to narcotic agent status: Secondary | ICD-10-CM | POA: Diagnosis not present

## 2024-02-18 DIAGNOSIS — R2681 Unsteadiness on feet: Secondary | ICD-10-CM | POA: Diagnosis not present

## 2024-02-18 DIAGNOSIS — E785 Hyperlipidemia, unspecified: Secondary | ICD-10-CM | POA: Diagnosis not present

## 2024-02-18 DIAGNOSIS — Z1152 Encounter for screening for COVID-19: Secondary | ICD-10-CM | POA: Diagnosis not present

## 2024-02-18 DIAGNOSIS — R54 Age-related physical debility: Secondary | ICD-10-CM | POA: Diagnosis not present

## 2024-02-18 DIAGNOSIS — R1310 Dysphagia, unspecified: Secondary | ICD-10-CM | POA: Diagnosis not present

## 2024-02-18 DIAGNOSIS — I110 Hypertensive heart disease with heart failure: Secondary | ICD-10-CM | POA: Diagnosis not present

## 2024-02-18 DIAGNOSIS — K219 Gastro-esophageal reflux disease without esophagitis: Secondary | ICD-10-CM | POA: Diagnosis not present

## 2024-02-18 DIAGNOSIS — J9811 Atelectasis: Secondary | ICD-10-CM | POA: Diagnosis not present

## 2024-02-18 DIAGNOSIS — N182 Chronic kidney disease, stage 2 (mild): Secondary | ICD-10-CM | POA: Diagnosis not present

## 2024-02-18 DIAGNOSIS — Z7401 Bed confinement status: Secondary | ICD-10-CM | POA: Diagnosis not present

## 2024-02-18 DIAGNOSIS — M3500 Sicca syndrome, unspecified: Secondary | ICD-10-CM | POA: Diagnosis not present

## 2024-02-18 DIAGNOSIS — J40 Bronchitis, not specified as acute or chronic: Secondary | ICD-10-CM | POA: Diagnosis not present

## 2024-02-18 DIAGNOSIS — R748 Abnormal levels of other serum enzymes: Secondary | ICD-10-CM | POA: Diagnosis not present

## 2024-02-18 DIAGNOSIS — R2689 Other abnormalities of gait and mobility: Secondary | ICD-10-CM | POA: Diagnosis not present

## 2024-02-18 DIAGNOSIS — R140 Abdominal distension (gaseous): Secondary | ICD-10-CM | POA: Diagnosis not present

## 2024-02-18 DIAGNOSIS — I517 Cardiomegaly: Secondary | ICD-10-CM | POA: Diagnosis not present

## 2024-02-18 DIAGNOSIS — M810 Age-related osteoporosis without current pathological fracture: Secondary | ICD-10-CM | POA: Diagnosis not present

## 2024-02-18 DIAGNOSIS — R0609 Other forms of dyspnea: Secondary | ICD-10-CM | POA: Diagnosis not present

## 2024-02-18 DIAGNOSIS — H9190 Unspecified hearing loss, unspecified ear: Secondary | ICD-10-CM | POA: Diagnosis not present

## 2024-02-18 DIAGNOSIS — R918 Other nonspecific abnormal finding of lung field: Secondary | ICD-10-CM | POA: Diagnosis not present

## 2024-02-18 DIAGNOSIS — J9601 Acute respiratory failure with hypoxia: Secondary | ICD-10-CM

## 2024-02-18 DIAGNOSIS — I5032 Chronic diastolic (congestive) heart failure: Secondary | ICD-10-CM | POA: Diagnosis not present

## 2024-02-18 DIAGNOSIS — Z9181 History of falling: Secondary | ICD-10-CM | POA: Diagnosis not present

## 2024-02-18 DIAGNOSIS — M549 Dorsalgia, unspecified: Secondary | ICD-10-CM | POA: Diagnosis not present

## 2024-02-18 DIAGNOSIS — R531 Weakness: Secondary | ICD-10-CM | POA: Diagnosis not present

## 2024-02-18 DIAGNOSIS — I10 Essential (primary) hypertension: Secondary | ICD-10-CM | POA: Diagnosis not present

## 2024-02-18 DIAGNOSIS — I5033 Acute on chronic diastolic (congestive) heart failure: Secondary | ICD-10-CM | POA: Diagnosis not present

## 2024-02-18 DIAGNOSIS — R41841 Cognitive communication deficit: Secondary | ICD-10-CM | POA: Diagnosis not present

## 2024-02-18 DIAGNOSIS — Z66 Do not resuscitate: Secondary | ICD-10-CM | POA: Diagnosis not present

## 2024-02-18 DIAGNOSIS — M6281 Muscle weakness (generalized): Secondary | ICD-10-CM | POA: Diagnosis not present

## 2024-02-18 DIAGNOSIS — M48061 Spinal stenosis, lumbar region without neurogenic claudication: Secondary | ICD-10-CM | POA: Diagnosis not present

## 2024-02-18 DIAGNOSIS — R0989 Other specified symptoms and signs involving the circulatory and respiratory systems: Secondary | ICD-10-CM | POA: Diagnosis not present

## 2024-02-18 DIAGNOSIS — Z743 Need for continuous supervision: Secondary | ICD-10-CM | POA: Diagnosis not present

## 2024-02-18 DIAGNOSIS — G8929 Other chronic pain: Secondary | ICD-10-CM | POA: Diagnosis not present

## 2024-02-18 DIAGNOSIS — Z23 Encounter for immunization: Secondary | ICD-10-CM | POA: Diagnosis not present

## 2024-02-18 DIAGNOSIS — I5031 Acute diastolic (congestive) heart failure: Secondary | ICD-10-CM | POA: Diagnosis not present

## 2024-02-18 DIAGNOSIS — I081 Rheumatic disorders of both mitral and tricuspid valves: Secondary | ICD-10-CM | POA: Diagnosis not present

## 2024-02-18 DIAGNOSIS — Z888 Allergy status to other drugs, medicaments and biological substances status: Secondary | ICD-10-CM | POA: Diagnosis not present

## 2024-02-18 DIAGNOSIS — F411 Generalized anxiety disorder: Secondary | ICD-10-CM | POA: Diagnosis not present

## 2024-02-18 DIAGNOSIS — Z79899 Other long term (current) drug therapy: Secondary | ICD-10-CM | POA: Diagnosis not present

## 2024-02-18 LAB — RESPIRATORY PANEL BY PCR

## 2024-02-18 LAB — CBC
HCT: 36.6 % (ref 36.0–46.0)
Hemoglobin: 12 g/dL (ref 12.0–15.0)
MCH: 33 pg (ref 26.0–34.0)
MCHC: 32.8 g/dL (ref 30.0–36.0)
MCV: 100.5 fL — ABNORMAL HIGH (ref 80.0–100.0)
Platelets: 208 K/uL (ref 150–400)
RBC: 3.64 MIL/uL — ABNORMAL LOW (ref 3.87–5.11)
RDW: 14.2 % (ref 11.5–15.5)
WBC: 4.4 K/uL (ref 4.0–10.5)
nRBC: 0 % (ref 0.0–0.2)

## 2024-02-18 LAB — VITAMIN B12: Vitamin B-12: 622 pg/mL (ref 180–914)

## 2024-02-18 LAB — COMPREHENSIVE METABOLIC PANEL WITH GFR
ALT: 13 U/L (ref 0–44)
AST: 30 U/L (ref 15–41)
Albumin: 3.3 g/dL — ABNORMAL LOW (ref 3.5–5.0)
Alkaline Phosphatase: 306 U/L — ABNORMAL HIGH (ref 38–126)
Anion gap: 10 (ref 5–15)
BUN: 14 mg/dL (ref 8–23)
CO2: 26 mmol/L (ref 22–32)
Calcium: 9 mg/dL (ref 8.9–10.3)
Chloride: 99 mmol/L (ref 98–111)
Creatinine, Ser: 0.6 mg/dL (ref 0.44–1.00)
GFR, Estimated: >60 mL/min (ref >60)
Glucose, Bld: 74 mg/dL (ref 70–99)
Potassium: 4.5 mmol/L (ref 3.5–5.1)
Sodium: 135 mmol/L (ref 135–145)
Total Bilirubin: 0.7 mg/dL (ref 0.0–1.2)
Total Protein: 6.2 g/dL — ABNORMAL LOW (ref 6.5–8.1)

## 2024-02-18 LAB — PROCALCITONIN: Procalcitonin: <0.1 ng/mL

## 2024-02-18 LAB — PHOSPHORUS: Phosphorus: 3.6 mg/dL (ref 2.5–4.6)

## 2024-02-18 LAB — MAGNESIUM: Magnesium: 2.1 mg/dL (ref 1.7–2.4)

## 2024-02-18 LAB — MRSA NEXT GEN BY PCR, NASAL: MRSA by PCR Next Gen: NOT DETECTED

## 2024-02-18 MED ORDER — IPRATROPIUM-ALBUTEROL 0.5-2.5 (3) MG/3ML IN SOLN
3.0000 mL | Freq: Three times a day (TID) | RESPIRATORY_TRACT | Status: DC
  Administered 2024-02-18 – 2024-02-20 (×6): 3 mL via RESPIRATORY_TRACT
  Filled 2024-02-18 (×6): qty 3

## 2024-02-18 MED ORDER — AZITHROMYCIN 250 MG PO TABS
500.0000 mg | ORAL_TABLET | Freq: Every day | ORAL | Status: AC
  Administered 2024-02-18 – 2024-02-20 (×3): 500 mg via ORAL
  Filled 2024-02-18 (×3): qty 2

## 2024-02-18 MED ORDER — FUROSEMIDE 10 MG/ML IJ SOLN
20.0000 mg | Freq: Every day | INTRAMUSCULAR | Status: DC
Start: 2024-02-18 — End: 2024-02-23
  Administered 2024-02-18 – 2024-02-23 (×6): 20 mg via INTRAVENOUS
  Filled 2024-02-18 (×6): qty 2

## 2024-02-18 MED ORDER — DOCUSATE SODIUM 100 MG PO CAPS
100.0000 mg | ORAL_CAPSULE | Freq: Every day | ORAL | Status: DC | PRN
  Administered 2024-02-19: 100 mg via ORAL
  Filled 2024-02-18: qty 1

## 2024-02-18 NOTE — Progress Notes (Signed)
 PROGRESS NOTE    Lauren Davidson  FMW:[id number] DOB: November 02, 1921 DOA: 02/17/2024 PCP: Dwight Trula SQUIBB, MD   Brief Narrative: 89 year old with past medical history significant for heart failure ejection fraction preserved, lumbar compression fracture, hyperlipidemia presented with acute hypoxic respiratory failure due to acute heart failure exacerbation with preserved ejection fraction.  Has developed worsening shortness of breath over the last few days, she has been also having worsening back pain.  Patient also developed worsening lower extremity edema.   Assessment & Plan:   Principal Problem:   Acute CHF (congestive heart failure) (HCC)   1-Acute on chronic diastolic heart failure exacerbation - Mild to moderate mitral valve regurgitation, moderate tricuspid valve regurgitation -Present with elevated BNP, pulmonary interstitial edema, lower extremity edema. -Will continue with IV Lasix 20 mg IV daily -Strict I's and O's - Daily weight  Acute hypoxic respiratory failure in the setting of heart failure exacerbation. -CT angio chest: Pulmonary findings compatible with airway infection/inflammation and air trapping. Small-to-moderate bilateral pleural effusions with lower-lobe compressive atelectasis. - Schedule DuoNeb and add azithromycin due to CT chest findings Continue with IV Lasix  Hypertension: Continue low-dose Norvasc   Chronic back pain - Continue as needed oxycodone   Elevated alkaline phosphatase GGT not significantly elevated. Follow as an outpatient  Generalized weakness: Suspect related to acute illness.  Checking magnesium and phosphorus. Check B12  Estimated body mass index is 21.5 kg/m as calculated from the following:   Height as of this encounter: 5' 5 (1.651 m).   Weight as of this encounter: 58.6 kg.   DVT prophylaxis: Lovenox  Code Status: DNR limited Family Communication: Discussed with patient Disposition Plan:  Status is: Observation The  patient will require care spanning > 2 midnights and should be moved to inpatient because: Management of heart failure exacerbation    Consultants:  None  Procedures:    Antimicrobials:    Subjective: She is still having shortness of breath, but does feel a little bit better than yesterday.  Reports cough  Objective: Vitals:   02/17/24 2043 02/17/24 2347 02/18/24 0344 02/18/24 0500  BP: (!) 144/65 (!) 140/64 135/60   Pulse: 65 63 61   Resp: 19 19 18    Temp: 97.9 F (36.6 C) 97.8 F (36.6 C) (!) 97.5 F (36.4 C)   TempSrc: Oral Oral Oral   SpO2: 95% 94% 93%   Weight: 58.6 kg   58.6 kg  Height: 5' 5 (1.651 m)      No intake or output data in the 24 hours ending 02/18/24 0814 Filed Weights   02/17/24 0854 02/17/24 2043 02/18/24 0500  Weight: 54.4 kg 58.6 kg 58.6 kg    Examination:  General exam: Appears calm and comfortable  Respiratory system: Tachypnea, bilateral crackles Cardiovascular system: S1 & S2 heard, RRR.  Present normal Gastrointestinal system: Abdomen is nondistended, soft and nontender. No organomegaly or masses felt. Normal bowel sounds heard. Central nervous system: Alert and oriented. No focal neurological deficits. Extremities: Symmetric 5 x 5 power.    Data Reviewed: I have personally reviewed following labs and imaging studies  CBC: Recent Labs  Lab 02/17/24 0921 02/18/24 0523  WBC 4.1 4.4  NEUTROABS 2.6  --   HGB 12.2 12.0  HCT 37.4 36.6  MCV 101.1* 100.5*  PLT 241 208   Basic Metabolic Panel: Recent Labs  Lab 02/17/24 0921 02/18/24 0523  NA 134* 135  K 4.3 4.5  CL 98 99  CO2 25 26  GLUCOSE 86 74  BUN 16  14  CREATININE 0.59 0.60  CALCIUM  9.4 9.0   GFR: Estimated Creatinine Clearance: 33.6 mL/min (by C-G formula based on SCr of 0.6 mg/dL). Liver Function Tests: Recent Labs  Lab 02/17/24 0921 02/18/24 0523  AST 34 30  ALT 15 13  ALKPHOS 353* 306*  BILITOT 0.6 0.7  PROT 7.5 6.2*  ALBUMIN  4.0 3.3*   No results  for input(s): LIPASE, AMYLASE in the last 168 hours. No results for input(s): AMMONIA in the last 168 hours. Coagulation Profile: No results for input(s): INR, PROTIME in the last 168 hours. Cardiac Enzymes: No results for input(s): CKTOTAL, CKMB, CKMBINDEX, TROPONINI in the last 168 hours. BNP (last 3 results) Recent Labs    02/17/24 0921  PROBNP 976.0*   HbA1C: No results for input(s): HGBA1C in the last 72 hours. CBG: No results for input(s): GLUCAP in the last 168 hours. Lipid Profile: No results for input(s): CHOL, HDL, LDLCALC, TRIG, CHOLHDL, LDLDIRECT in the last 72 hours. Thyroid  Function Tests: No results for input(s): TSH, T4TOTAL, FREET4, T3FREE, THYROIDAB in the last 72 hours. Anemia Panel: No results for input(s): VITAMINB12, FOLATE, FERRITIN, TIBC, IRON , RETICCTPCT in the last 72 hours. Sepsis Labs: Recent Labs  Lab 02/17/24 9078  LATICACIDVEN 0.7    Recent Results (from the past 240 hours)  Resp panel by RT-PCR (RSV, Flu A&B, Covid) Anterior Nasal Swab     Status: None   Collection Time: 02/17/24  9:09 AM   Specimen: Anterior Nasal Swab  Result Value Ref Range Status   SARS Coronavirus 2 by RT PCR NEGATIVE NEGATIVE Final    Comment: (NOTE) SARS-CoV-2 target nucleic acids are NOT DETECTED.  The SARS-CoV-2 RNA is generally detectable in upper respiratory specimens during the acute phase of infection. The lowest concentration of SARS-CoV-2 viral copies this assay can detect is 138 copies/mL. A negative result does not preclude SARS-Cov-2 infection and should not be used as the sole basis for treatment or other patient management decisions. A negative result may occur with  improper specimen collection/handling, submission of specimen other than nasopharyngeal swab, presence of viral mutation(s) within the areas targeted by this assay, and inadequate number of viral copies(<138 copies/mL). A negative  result must be combined with clinical observations, patient history, and epidemiological information. The expected result is Negative.  Fact Sheet for Patients:  BloggerCourse.com  Fact Sheet for Healthcare Providers:  SeriousBroker.it  This test is no t yet approved or cleared by the [place]  FDA and  has been authorized for detection and/or diagnosis of SARS-CoV-2 by FDA under an Emergency Use Authorization (EUA). This EUA will remain  in effect (meaning this test can be used) for the duration of the COVID-19 declaration under Section 564(b)(1) of the Act, 21 U.S.C.section 360bbb-3(b)(1), unless the authorization is terminated  or revoked sooner.       Influenza A by PCR NEGATIVE NEGATIVE Final   Influenza B by PCR NEGATIVE NEGATIVE Final    Comment: (NOTE) The Xpert Xpress SARS-CoV-2/FLU/RSV plus assay is intended as an aid in the diagnosis of influenza from Nasopharyngeal swab specimens and should not be used as a sole basis for treatment. Nasal washings and aspirates are unacceptable for Xpert Xpress SARS-CoV-2/FLU/RSV testing.  Fact Sheet for Patients: BloggerCourse.com  Fact Sheet for Healthcare Providers: SeriousBroker.it  This test is not yet approved or cleared by the [place]  FDA and has been authorized for detection and/or diagnosis of SARS-CoV-2 by FDA under an Emergency Use Authorization (EUA). This EUA will remain in  effect (meaning this test can be used) for the duration of the COVID-19 declaration under Section 564(b)(1) of the Act, 21 U.S.C. section 360bbb-3(b)(1), unless the authorization is terminated or revoked.     Resp Syncytial Virus by PCR NEGATIVE NEGATIVE Final    Comment: (NOTE) Fact Sheet for Patients: BloggerCourse.com  Fact Sheet for Healthcare Providers: SeriousBroker.it  This  test is not yet approved or cleared by the [place]  FDA and has been authorized for detection and/or diagnosis of SARS-CoV-2 by FDA under an Emergency Use Authorization (EUA). This EUA will remain in effect (meaning this test can be used) for the duration of the COVID-19 declaration under Section 564(b)(1) of the Act, 21 U.S.C. section 360bbb-3(b)(1), unless the authorization is terminated or revoked.  Performed at [facility], [address] [place], [place] [address]   Blood culture (routine x 2)     Status: None (Preliminary result)   Collection Time: 02/17/24  9:21 AM   Specimen: BLOOD LEFT ARM  Result Value Ref Range Status   Specimen Description   Final    BLOOD LEFT ARM Performed at [facility], [address] [place], [place] [address]    Special Requests   Final    BOTTLES DRAWN AEROBIC AND ANAEROBIC Blood Culture adequate volume Performed at [facility], [address], [place], [place] [address]    Culture PENDING  Incomplete   Report Status PENDING  Incomplete  MRSA Next Gen by PCR, Nasal     Status: None   Collection Time: 02/17/24  8:17 PM   Specimen: Nasal Mucosa; Nasal Swab  Result Value Ref Range Status   MRSA by PCR Next Gen NOT DETECTED NOT DETECTED Final    Comment: (NOTE) The GeneXpert MRSA Assay (FDA approved for NASAL specimens only), is one component of a comprehensive MRSA colonization surveillance program. It is not intended to diagnose MRSA infection nor to guide or monitor treatment for MRSA infections. Test performance is not FDA approved in patients less than 18 years old. Performed at [facility], [address] [place], [place] [address]          Radiology Studies: CT Angio Chest Pulmonary Embolism (PE) W or WO Contrast Result Date: 02/17/2024 EXAM: CTA CHEST 02/17/2024 07:21:19 PM TECHNIQUE: CTA of the chest was performed without and with the administration of 75  mL of iohexol  (OMNIPAQUE ) 350 MG/ML injection. Multiplanar reformatted images are provided for review. MIP images are provided for review. Automated exposure control, iterative reconstruction, and/or weight based adjustment of the mA/kV was utilized to reduce the radiation dose to as low as reasonably achievable. COMPARISON: Chest x-ray and CT chest 10/16/2023 negative for pulmonary embolism. CLINICAL HISTORY: Pulmonary embolism (PE) suspected, high prob. PE suspected. Shortness of breath. FINDINGS: PULMONARY ARTERIES: Pulmonary arteries are adequately opacified for evaluation. No acute pulmonary embolus. Main pulmonary artery is normal in caliber. MEDIASTINUM: Cardiomegaly. Aortic and coronary artery atherosclerotic calcifications. No pericardial effusion. There is no acute abnormality of the thoracic aorta. LYMPH NODES: No mediastinal, hilar or axillary lymphadenopathy. LUNGS AND PLEURA: Bronchial wall thickening with mucus plugging in the lower lobes. Small-to-moderate bilateral pleural effusions and compressive atelectasis in the lower lobes. No zte attenuation of the lungs compatible with air trapping and small airway infection/inflammation. No focal consolidation or pulmonary edema. No pneumothorax. UPPER ABDOMEN: Limited images of the upper abdomen are unremarkable. SOFT TISSUES AND BONES: Numerous vertebral body compression fractures in the thoracic and upper lumbar spine  are unchanged from prior. Vertebroplasty of T12, L1, and L2. Remote sternal fracture. No acute soft tissue abnormality. IMPRESSION: 1. No pulmonary embolism. 2. Pulmonary findings compatible with airway infection/inflammation and air trapping. 3. Small-to-moderate bilateral pleural effusions with lower-lobe compressive atelectasis. Electronically signed by: Norman Gatlin MD 02/17/2024 07:43 PM EDT RP Workstation: [id number]   DG Chest Portable 1 View Result Date: 02/17/2024 CLINICAL DATA:  One 89 year old female with shortness of  breath. EXAM: PORTABLE CHEST 1 VIEW COMPARISON:  Chest CT 10/16/2023 and earlier. FINDINGS: Portable AP semi upright view at 0926 hours. Stable cardiomegaly since May. Calcified aortic atherosclerosis. Other mediastinal contours are within normal limits. Visualized tracheal air column is within normal limits. Stable lung volumes. Small pleural effusions at that time likely persist. Increased generalized pulmonary vascular congestion from previous exams. No pneumothorax. No consolidation. Stable visualized osseous structures. Multilevel previous spinal compression fractures and augmentation. Paucity of bowel gas the visible abdomen. IMPRESSION: 1. Chronic cardiomegaly with suspected acute pulmonary interstitial edema. Viral/atypical respiratory infection felt less likely. Small chronic pleural effusions suspected. 2. Chronic compression fractures and augmentation. Electronically Signed   By: VEAR Hurst M.D.   On: 02/17/2024 09:49        Scheduled Meds:  amLODipine   5 mg Oral Daily   enoxaparin  (LOVENOX ) injection  40 mg Subcutaneous Q24H   Influenza vac split trivalent PF  0.5 mL Intramuscular Tomorrow-1000   pantoprazole   40 mg Oral Daily   Continuous Infusions:   LOS: 0 days    Time spent: 35 minutes    Jamerson Vonbargen A Jalecia Leon, MD [facility]   If 7PM-7AM, please contact night-coverage [email or web address]  02/18/2024, 8:14 AM

## 2024-02-18 NOTE — TOC Initial Note (Signed)
 Transition of Care [facility]) - Initial/Assessment Note    Patient Details  Name: Lauren Davidson MRN: [id number] Date of Birth: 29-Apr-1922  Transition of Care [facility]) CM/SW Contact:    Heather DELENA Saltness, LCSW Phone Number: 02/18/2024, 10:43 AM  Clinical Narrative:                 Pt admitted to hospital from [facility]. CSW spoke with pt's son-in-law/POA, Helayne Grand [phone], via phone call to discuss pt's discharge plan. Pt resides at [facility]. Pt's son-in-law and daughter, Jorja Grand, share POA. Pt's son-in-law reports family will transport pt back to [facility] upon discharge. PT evaluation pending. TOC will continue to follow.    Expected Discharge Plan: Assisted Living Barriers to Discharge: Continued Medical Work up   Patient Goals and CMS Choice Patient states their goals for this hospitalization and ongoing recovery are:: To return to [facility]        Expected Discharge Plan and Services In-house Referral: Clinical Social Work Discharge Planning Services: NA Post Acute Care Choice: NA Living arrangements for the past 2 months: Assisted Living Facility                 DME Arranged: N/A DME Agency: NA       HH Arranged: NA HH Agency: NA        Prior Living Arrangements/Services Living arrangements for the past 2 months: Assisted Living Facility Lives with:: Self, Facility Resident Patient language and need for interpreter reviewed:: Yes Do you feel safe going back to the place where you live?: Yes      Need for Family Participation in Patient Care: Yes (Comment) Care giver support system in place?: Yes (comment)   Criminal Activity/Legal Involvement Pertinent to Current Situation/Hospitalization: No - Comment as needed  Activities of Daily Living   ADL Screening (condition at time of admission) Independently performs ADLs?: No Does the patient have a NEW difficulty with bathing/dressing/toileting/self-feeding that is expected to  last >3 days?: No Does the patient have a NEW difficulty with getting in/out of bed, walking, or climbing stairs that is expected to last >3 days?: Yes (Initiates electronic notice to provider for possible PT consult) Does the patient have a NEW difficulty with communication that is expected to last >3 days?: No Is the patient deaf or have difficulty hearing?: Yes Does the patient have difficulty seeing, even when wearing glasses/contacts?: Yes Does the patient have difficulty concentrating, remembering, or making decisions?: Yes  Permission Sought/Granted Permission sought to share information with : Facility Medical sales representative, Family Supports Permission granted to share information with : Yes, Verbal Permission Granted  Share Information with NAME: Helayne Grand and Jorja Grand  Permission granted to share info w AGENCY: [facility]  Permission granted to share info w Relationship: Son-in-Law and daughter (both [place])  Permission granted to share info w Contact Information: [phone]  Emotional Assessment Appearance:: Well-Groomed, Appears stated age Attitude/Demeanor/Rapport: Unable to Assess Affect (typically observed): Unable to Assess Orientation: : Oriented to Self, Oriented to Place, Oriented to  Time, Oriented to Situation Alcohol  / Substance Use: Not Applicable Psych Involvement: No (comment)  Admission diagnosis:  Acute CHF (congestive heart failure) (HCC) [I50.9] Acute respiratory failure with hypoxia (HCC) [J96.01] Patient Active Problem List   Diagnosis Date Noted   Acute CHF (congestive heart failure) (HCC) 02/17/2024   Fall at home, initial encounter 10/22/2023   Frail elderly 10/22/2023   DNR (do not resuscitate)/DNI(Do Not Intubate) 10/22/2023   Closed fracture  of multiple ribs of right side 10/16/2023   HOH (hard of hearing) 05/16/2023   Back pain 09/17/2021   Lumbar spinal stenosis 09/17/2021   HTN (hypertension) 09/17/2021   HLD (hyperlipidemia) 09/17/2021    Gastroesophageal reflux disease without esophagitis 03/01/2019   Sjogren's syndrome 05/18/2015   Chronic kidney disease (CKD), stage II (mild) 05/18/2015   Generalized anxiety disorder 05/18/2015   Osteoporosis 05/18/2015   Status post intraocular lens implant 11/14/2011   PCP:  Dwight Trula SQUIBB, MD Pharmacy:  No Pharmacies Listed    Social Drivers of Health (SDOH) Social History: SDOH Screenings   Food Insecurity: No Food Insecurity (02/17/2024)  Housing: Low Risk  (02/17/2024)  Transportation Needs: No Transportation Needs (02/17/2024)  Utilities: Not At Risk (02/17/2024)  Social Connections: Moderately Integrated (02/17/2024)  Tobacco Use: Low Risk  (02/17/2024)   SDOH Interventions: None indicated     Readmission Risk Interventions     No data to display          Signed: Heather Saltness, MSW, LCSW Clinical Social Worker Inpatient Care Management 02/18/2024 10:46 AM

## 2024-02-18 NOTE — Evaluation (Signed)
 Physical Therapy Evaluation Patient Details Name: Lauren Davidson MRN: [id number] DOB: Feb 08, 1922 Today's Date: 02/18/2024  History of Present Illness  Pt is a 89 y.o. female admitted with acute hypoxic respiratory failure likely due to acute heart failure with preserved EF.  PMH significant for heart failure with preserved EF, lumbar compression fractures, hyperlipidemia, and L IM nail.  Clinical Impression  Pt is a 89 y.o. female with above HPI resulting in the deficits listed below (see PT Problem List). Pt resides at [facility] and receives some assist with ADLs and assist for mobility. Per pt and family pt has been using w/c mostly for mobility and used RW for short distances (to door and back in her room per family)when working with therapy. Pt has been d/c from HHPT. Pt performed bed mobility and sit to stand x2 with MOD A- unable to take steps. sit to stand transfers with MIN guard for safety and cues for safe hand placement. Pt ambulated total of ~230ft with xPt will benefit from skilled PT to maximize functional mobility to increase independence. Recommend inpatient rehab <3hrs upon d/c unless [facility] able to provide level of assist required.          If plan is discharge home, recommend the following: A lot of help with bathing/dressing/bathroom;A lot of help with walking and/or transfers;Help with stairs or ramp for entrance;Assist for transportation;Assistance with cooking/housework   Can travel by private vehicle   No    Equipment Recommendations None recommended by PT  Recommendations for Other Services  OT consult    Functional Status Assessment Patient has had a recent decline in their functional status and demonstrates the ability to make significant improvements in function in a reasonable and predictable amount of time.     Precautions / Restrictions Precautions Precautions: Fall Precaution/Restrictions Comments: spinal for comfort (history of compression  fractures and chronic back pain) Restrictions Weight Bearing Restrictions Per Provider Order: No      Mobility  Bed Mobility Overal bed mobility: Needs Assistance Bed Mobility: Supine to Sit, Sit to Supine     Supine to sit: Mod assist, HOB elevated, Used rails Sit to supine: Mod assist, HOB elevated, Used rails   General bed mobility comments: pt reports she has not used log roll technique in a while. Cues for sequencing and HHA to bring trunk to upright and assist with chuck pad for completion of scooting to EOB. Goal of session for performance of transfer to recliner which pt agreeable- once seated EOB pt requesting to reutrn to supine. Pt able to sit EOB for several minutes and perform LAQ intermittently. Reported increased UE fatigue - using B UEs support for seated balance.    Transfers Overall transfer level: Needs assistance Equipment used: 1 person hand held assist Transfers: Sit to/from Stand Sit to Stand: Mod assist           General transfer comment: attempted scooting laterally with increased difficulty. Able to perform sit to stand x2 for repositiong toward [facility] with pt taking x1 shuffle steps, but able to stand x2 and sit with hipspostioined laterally to get closer to [facility] prior to return to supine. O2 and HR stable throughout session.    Ambulation/Gait                  Stairs            Wheelchair Mobility     Tilt Bed    Modified Rankin (Stroke Patients Only)  Balance Overall balance assessment: Needs assistance Sitting-balance support: Feet supported, Bilateral upper extremity supported Sitting balance-Leahy Scale: Poor Sitting balance - Comments: requires UE support to maintain balance                                     Pertinent Vitals/Pain Pain Assessment Pain Assessment: Faces Faces Pain Scale: Hurts little more Pain Location: back Pain Descriptors / Indicators: Grimacing Pain Intervention(s): Limited  activity within patient's tolerance, Monitored during session, Repositioned    Home Living Family/patient expects to be discharged to:: Assisted living                 Home Equipment: Rollator (4 wheels);Grab bars - tub/shower;Shower seat;Grab bars - toilet;Wheelchair - manual Additional Comments: resides at [facility]    Prior Function Prior Level of Function : Needs assist       Physical Assist : Mobility (physical);ADLs (physical)     Mobility Comments: amb with RW  in room 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 ADLs Comments: some assistance for bathing/dressing     Extremity/Trunk Assessment   Upper Extremity Assessment Upper Extremity Assessment: Defer to OT evaluation    Lower Extremity Assessment Lower Extremity Assessment: Generalized weakness    Cervical / Trunk Assessment Cervical / Trunk Assessment: Kyphotic  Communication   Communication Communication: Impaired Factors Affecting Communication: Hearing impaired    Cognition Arousal: Alert Behavior During Therapy: WFL for tasks assessed/performed   PT - Cognitive impairments: No apparent impairments                         Following commands: Intact       Cueing Cueing Techniques: Verbal cues, Visual cues, Tactile cues     General Comments      Exercises     Assessment/Plan    PT Assessment Patient needs continued PT services  PT Problem List Decreased strength;Decreased activity tolerance;Decreased balance;Decreased mobility;Pain       PT Treatment Interventions DME instruction;Gait training;Functional mobility training;Therapeutic activities;Therapeutic exercise;Balance training;Patient/family education;Wheelchair mobility training    PT Goals (Current goals can be found in the Care Plan section)  Acute Rehab PT Goals Patient Stated Goal: improve  strength and be able to walk again PT Goal Formulation: With patient/family Time For Goal Achievement: 03/03/24 Potential to Achieve Goals: Fair    Frequency Min 3X/week     Co-evaluation               AM-PAC PT 6 Clicks Mobility  Outcome Measure Help needed turning from your back to your side while in a flat bed without using bedrails?: A Little Help needed moving from lying on your back to sitting on the side of a flat bed without using bedrails?: A Lot Help needed moving to and from a bed to a chair (including a wheelchair)?: A Lot Help needed standing up from a chair using your arms (e.g., wheelchair or bedside chair)?: A Lot Help needed to walk in hospital room?: A Lot Help needed climbing 3-5 steps with a railing? : Total 6 Click Score: 12    End of Session Equipment Utilized During Treatment: Oxygen Activity Tolerance: Patient limited by fatigue Patient left: in bed;with call bell/phone within reach;with bed alarm set;with family/visitor present (with staff in  room to draw labs) Nurse Communication: Mobility status PT Visit Diagnosis: Muscle weakness (generalized) (M62.81);Difficulty in walking, not elsewhere classified (R26.2);Repeated falls (R29.6);Pain Pain - Right/Left:  (back) Pain - part of body:  (back)    Time: 8751-8672 PT Time Calculation (min) (ACUTE ONLY): 39 min   Charges:   PT Evaluation $PT Eval Low Complexity: 1 Low PT Treatments $Therapeutic Activity: 23-37 mins PT General Charges $$ ACUTE PT VISIT: 1 Visit         Tinnie BERRY PT, DPT  Acute Rehabilitation Services  Office [phone] 02/18/2024, 3:43 PM

## 2024-02-18 NOTE — Progress Notes (Signed)
 Pt to floor via stretcher with ER staff, daughter and son in law. Oriented to room, policies and poc. Pt is hard of hearing, so the family answered some of the admission questions. Placed water on O2 because she c/o her nose feeling dried out. Will cont to monitor this shift.

## 2024-02-18 NOTE — Plan of Care (Signed)

## 2024-02-18 NOTE — Care Management Obs Status (Signed)
 MEDICARE OBSERVATION STATUS NOTIFICATION   Patient Details  Name: Lauren Davidson MRN: [id number] Date of Birth: Jun 10, 1921   Medicare Observation Status Notification Given:  Yes    Vonna Brabson A Bjorn Hallas, LCSW 02/18/2024, 10:40 AM

## 2024-02-18 NOTE — Plan of Care (Signed)
 ?  Problem: Clinical Measurements: ?Goal: Respiratory complications will improve ?Outcome: Progressing ?Goal: Cardiovascular complication will be avoided ?Outcome: Progressing ?  ?Problem: Safety: ?Goal: Ability to remain free from injury will improve ?Outcome: Progressing ?  ?

## 2024-02-19 DIAGNOSIS — I5031 Acute diastolic (congestive) heart failure: Secondary | ICD-10-CM | POA: Diagnosis not present

## 2024-02-19 DIAGNOSIS — J9601 Acute respiratory failure with hypoxia: Secondary | ICD-10-CM | POA: Diagnosis not present

## 2024-02-19 DIAGNOSIS — Z515 Encounter for palliative care: Secondary | ICD-10-CM | POA: Diagnosis not present

## 2024-02-19 LAB — VITAMIN D 25 HYDROXY (VIT D DEFICIENCY, FRACTURES): Vit D, 25-Hydroxy: 59.04 ng/mL (ref 30–100)

## 2024-02-19 LAB — BASIC METABOLIC PANEL WITH GFR
Anion gap: 10 (ref 5–15)
BUN: 25 mg/dL — ABNORMAL HIGH (ref 8–23)
CO2: 27 mmol/L (ref 22–32)
Calcium: 8.9 mg/dL (ref 8.9–10.3)
Chloride: 98 mmol/L (ref 98–111)
Creatinine, Ser: 0.8 mg/dL (ref 0.44–1.00)
GFR, Estimated: >60 mL/min (ref >60)
Glucose, Bld: 94 mg/dL (ref 70–99)
Potassium: 4.5 mmol/L (ref 3.5–5.1)
Sodium: 135 mmol/L (ref 135–145)

## 2024-02-19 MED ORDER — ACETAMINOPHEN 500 MG PO TABS
1000.0000 mg | ORAL_TABLET | Freq: Four times a day (QID) | ORAL | Status: DC | PRN
  Administered 2024-02-20: 1000 mg via ORAL
  Filled 2024-02-19 (×2): qty 2

## 2024-02-19 MED ORDER — ACETAMINOPHEN 650 MG RE SUPP: 650.0000 mg | Freq: Four times a day (QID) | RECTAL | Status: DC | PRN

## 2024-02-19 MED ORDER — FUROSEMIDE 10 MG/ML IJ SOLN
20.0000 mg | Freq: Once | INTRAMUSCULAR | Status: AC
Start: 2024-02-19 — End: 2024-02-19
  Administered 2024-02-19: 20 mg via INTRAVENOUS
  Filled 2024-02-19: qty 2

## 2024-02-19 MED ORDER — GUAIFENESIN ER 600 MG PO TB12
1200.0000 mg | ORAL_TABLET | Freq: Two times a day (BID) | ORAL | Status: DC
  Administered 2024-02-19 – 2024-02-23 (×9): 1200 mg via ORAL
  Filled 2024-02-19 (×9): qty 2

## 2024-02-19 NOTE — NC FL2 (Signed)
 [place]  MEDICAID FL2 LEVEL OF CARE FORM     IDENTIFICATION  Patient Name: Lauren Davidson Birthdate: 1922-01-24 Sex: female Admission Date (Current Location): 02/17/2024  [name]:  Producer, television/film/video and Address:  [facility],  [address], [place] [address]      Provider Number: [id number]  Attending Physician Name and Address:  Madelyne Owen LABOR, MD  Relative Name and Phone Number:  Lauren Davidson [facility]), Lauren Davidson (Relative)  [phone]    Current Level of Care: Hospital Recommended Level of Care: Skilled Nursing Facility Prior Approval Number:    Date Approved/Denied:   PASRR Number: [id number]  Discharge Plan: SNF    Current Diagnoses: Patient Active Problem List   Diagnosis Date Noted   Acute CHF (congestive heart failure) (HCC) 02/17/2024   Fall at home, initial encounter 10/22/2023   Frail elderly 10/22/2023   DNR (do not resuscitate)/DNI(Do Not Intubate) 10/22/2023   Closed fracture of multiple ribs of right side 10/16/2023   HOH (hard of hearing) 05/16/2023   Back pain 09/17/2021   Lumbar spinal stenosis 09/17/2021   HTN (hypertension) 09/17/2021   HLD (hyperlipidemia) 09/17/2021   Gastroesophageal reflux disease without esophagitis 03/01/2019   Sjogren's syndrome 05/18/2015   Chronic kidney disease (CKD), stage II (mild) 05/18/2015   Generalized anxiety disorder 05/18/2015   Osteoporosis 05/18/2015   Status post intraocular lens implant 11/14/2011    Orientation RESPIRATION BLADDER Height & Weight     Self, Time, Situation, Place  O2 (2L via [place]) Incontinent Weight: 129 lb 13.6 oz (58.9 kg) Height:  5' 5 (165.1 cm)  BEHAVIORAL SYMPTOMS/MOOD NEUROLOGICAL BOWEL NUTRITION STATUS      Incontinent Diet (Heart Healthy)  AMBULATORY STATUS COMMUNICATION OF NEEDS Skin   Limited Assist Verbally Normal                       Personal Care Assistance Level of Assistance  Bathing, Feeding, Dressing Bathing Assistance:  Maximum assistance Feeding assistance: Independent Dressing Assistance: Maximum assistance     Functional Limitations Info  Sight, Hearing, Speech Sight Info: Impaired (eyeglasses) Hearing Info: Impaired (hard-of-hearing) Speech Info: Adequate    SPECIAL CARE FACTORS FREQUENCY  PT (By licensed PT), OT (By licensed OT)     PT Frequency: 5x per week OT Frequency: 5x per week            Contractures Contractures Info: Not present    Additional Factors Info  Code Status, Allergies Code Status Info: DNR Allergies Info: Statins, Buprenorphine Hcl, Buspirone, Lexapro  (Escitalopram ), Meperidine, Morphine And Codeine           Current Medications (02/19/2024):  This is the current hospital active medication list Current Facility-Administered Medications  Medication Dose Route Frequency Provider Last Rate Last Admin   acetaminophen  (TYLENOL ) tablet 650 mg  650 mg Oral Q6H PRN Zella, Mir M, MD   650 mg at 02/19/24 0247   Or   acetaminophen  (TYLENOL ) suppository 650 mg  650 mg Rectal Q6H PRN Zella, Mir M, MD       albuterol (PROVENTIL) (2.5 MG/3ML) 0.083% nebulizer solution 2.5 mg  2.5 mg Nebulization Q2H PRN Zella, Mir M, MD       azithromycin (ZITHROMAX) tablet 500 mg  500 mg Oral Daily Regalado, Belkys A, MD   500 mg at 02/19/24 0951   docusate sodium  (COLACE) capsule 100 mg  100 mg Oral Daily PRN Chavez, Abigail, NP       enoxaparin  (LOVENOX ) injection  40 mg  40 mg Subcutaneous Q24H Zella, Mir M, MD   40 mg at 02/18/24 1214   furosemide (LASIX) injection 20 mg  20 mg Intravenous Daily Regalado, Belkys A, MD   20 mg at 02/19/24 0951   ipratropium-albuterol (DUONEB) 0.5-2.5 (3) MG/3ML nebulizer solution 3 mL  3 mL Nebulization TID Regalado, Belkys A, MD   3 mL at 02/19/24 9177   ondansetron  (ZOFRAN ) tablet 4 mg  4 mg Oral Q6H PRN Zella, Mir M, MD       Or   ondansetron  (ZOFRAN ) injection 4 mg  4 mg Intravenous Q6H PRN Zella, Mir M, MD       oxyCODONE   (Oxy IR/ROXICODONE ) immediate release tablet 2.5 mg  2.5 mg Oral Q4H PRN Zella, Mir M, MD   2.5 mg at 02/19/24 0247   pantoprazole  (PROTONIX ) EC tablet 40 mg  40 mg Oral Daily Ikramullah, Mir M, MD   40 mg at 02/19/24 0951   traZODone (DESYREL) tablet 25 mg  25 mg Oral QHS PRN Zella Katha HERO, MD         Discharge Medications: Please see discharge summary for a list of discharge medications.  Relevant Imaging Results:  Relevant Lab Results:   Additional Information SSN: [id number]  Lauren LABOR Abdulhadi Stopa, LCSW

## 2024-02-19 NOTE — TOC Progression Note (Signed)
 Transition of Care [facility]) - Progression Note    Patient Details  Name: TEONNA COONAN MRN: [id number] Date of Birth: 09/12/1921  Transition of Care [facility]) CM/SW Contact  Heather DELENA Saltness, LCSW Phone Number: 02/19/2024, 10:23 AM  Clinical Narrative:    CSW spoke with pt's son-in-law/POA, Helayne Grand [phone], via phone call to discuss PT's recommendation for SNF placement upon discharge. Pt's son-in-law agreeable to SNF placement. CSW completed FL2 and sent referrals to SNF locations in [place] and surrounding areas. Awaiting bed offers, TOC will continue to follow.   Expected Discharge Plan: Assisted Living Barriers to Discharge: Continued Medical Work up   Expected Discharge Plan and Services In-house Referral: Clinical Social Work Discharge Planning Services: NA Post Acute Care Choice: NA Living arrangements for the past 2 months: Assisted Living Facility                 DME Arranged: N/A DME Agency: NA       HH Arranged: NA HH Agency: NA         Social Drivers of Health (SDOH) Interventions SDOH Screenings   Food Insecurity: No Food Insecurity (02/17/2024)  Housing: Low Risk  (02/17/2024)  Transportation Needs: No Transportation Needs (02/17/2024)  Utilities: Not At Risk (02/17/2024)  Social Connections: Moderately Integrated (02/17/2024)  Tobacco Use: Low Risk  (02/17/2024)    Readmission Risk Interventions     No data to display          Signed: Heather Saltness, MSW, LCSW Clinical Social Worker Inpatient Care Management 02/19/2024 10:24 AM

## 2024-02-19 NOTE — Progress Notes (Signed)
 PROGRESS NOTE    Lauren Davidson  FMW:[id number] DOB: 1921/07/13 DOA: 02/17/2024 PCP: Dwight Trula SQUIBB, MD   Brief Narrative: 89 year old with past medical history significant for heart failure ejection fraction preserved, lumbar compression fracture, hyperlipidemia presented with acute hypoxic respiratory failure due to acute heart failure exacerbation with preserved ejection fraction.  Has developed worsening shortness of breath over the last few days, she has been also having worsening back pain.  Patient also developed worsening lower extremity edema.   Assessment & Plan:   Principal Problem:   Acute CHF (congestive heart failure) (HCC)   1-Acute on chronic diastolic heart failure exacerbation - Mild to moderate mitral valve regurgitation, moderate tricuspid valve regurgitation -Present with elevated BNP, pulmonary interstitial edema, lower extremity edema. - IV Lasix 20 mg IV daily. Will give extra dose this afternoon.  -Strict I's and O's:  negative 500cc - Daily weight: 129 Pd Continue with IV lasix.   Acute hypoxic respiratory failure in the setting of heart failure exacerbation. -CT angio chest: Pulmonary findings compatible with airway infection/inflammation and air trapping. Small-to-moderate bilateral pleural effusions with lower-lobe compressive atelectasis. - Continue with  DuoNeb and added  azithromycin due to CT chest findings -Continue with IV Lasix -Start Guaifenesin.   Hypertension: Hold Norvasc  to avoid hypotension. Plan to give extra dose of lasix.   Chronic back pain - Continue as needed oxycodone   Elevated alkaline phosphatase GGT not significantly elevated. Follow as an outpatient  Generalized weakness: Suspect related to acute illness. -magnesium and phosphorus. Normal.  -B12: 622  Elevated D dimer; CTA chest negative for PE>  Sjogren's syndrome:  Continue biotene mouth wash.   Estimated body mass index is 21.61 kg/m as calculated from the  following:   Height as of this encounter: 5' 5 (1.651 m).   Weight as of this encounter: 58.9 kg.   DVT prophylaxis: Lovenox  Code Status: DNR limited Family Communication: Discussed with patient and daughter who was at bedside. Family asking for palliative care consultation, evaluation for Hospice. Son in Social worker is POA>  Disposition Plan:  Status is: Observation The patient will require care spanning > 2 midnights and should be moved to inpatient because: Management of heart failure exacerbation    Consultants:  None  Procedures:    Antimicrobials:    Subjective: Patient reports that she is not longer gasping for air, does not feel air hunger anymore.  Reports some improvement of the shortness of breath.  She continued to complain of her dry mouth.  She has history of Sjogren's  disease, She reports cough.  She is feeling weak, she wants to know why she feels so weak.  Talk about multiple possibilities.  Objective: Vitals:   02/18/24 1833 02/18/24 2045 02/19/24 0500 02/19/24 0504  BP: (!) 103/53 (!) 110/49  130/73  Pulse: 61 69  (!) 54  Resp: 19 (!) 21  18  Temp: 98.2 F (36.8 C) 98.2 F (36.8 C)  (!) 97 F (36.1 C)  TempSrc:      SpO2: 96% 96%  98%  Weight:   58.9 kg   Height:        Intake/Output Summary (Last 24 hours) at 02/19/2024 0757 Last data filed at 02/19/2024 0500 Gross per 24 hour  Intake 240 ml  Output 800 ml  Net -560 ml   Filed Weights   02/17/24 2043 02/18/24 0500 02/19/24 0500  Weight: 58.6 kg 58.6 kg 58.9 kg    Examination:  General exam: NAD Respiratory system: Less tachypnea,  sporadic ronchus, bilateral crackles Cardiovascular system: S 1, S 2 RRR Gastrointestinal system: BS present, soft, nt Central nervous system: alert, hard of hearing.  Extremities: no edema    Data Reviewed: I have personally reviewed following labs and imaging studies  CBC: Recent Labs  Lab 02/17/24 0921 02/18/24 0523  WBC 4.1 4.4  NEUTROABS 2.6  --    HGB 12.2 12.0  HCT 37.4 36.6  MCV 101.1* 100.5*  PLT 241 208   Basic Metabolic Panel: Recent Labs  Lab 02/17/24 0921 02/18/24 0523 02/18/24 1329 02/19/24 0532  NA 134* 135  --  135  K 4.3 4.5  --  4.5  CL 98 99  --  98  CO2 25 26  --  27  GLUCOSE 86 74  --  94  BUN 16 14  --  25*  CREATININE 0.59 0.60  --  0.80  CALCIUM  9.4 9.0  --  8.9  MG  --   --  2.1  --   PHOS  --   --  3.6  --    GFR: Estimated Creatinine Clearance: 33.6 mL/min (by C-G formula based on SCr of 0.8 mg/dL). Liver Function Tests: Recent Labs  Lab 02/17/24 0921 02/18/24 0523  AST 34 30  ALT 15 13  ALKPHOS 353* 306*  BILITOT 0.6 0.7  PROT 7.5 6.2*  ALBUMIN  4.0 3.3*   No results for input(s): LIPASE, AMYLASE in the last 168 hours. No results for input(s): AMMONIA in the last 168 hours. Coagulation Profile: No results for input(s): INR, PROTIME in the last 168 hours. Cardiac Enzymes: No results for input(s): CKTOTAL, CKMB, CKMBINDEX, TROPONINI in the last 168 hours. BNP (last 3 results) Recent Labs    02/17/24 0921  PROBNP 976.0*   HbA1C: No results for input(s): HGBA1C in the last 72 hours. CBG: No results for input(s): GLUCAP in the last 168 hours. Lipid Profile: No results for input(s): CHOL, HDL, LDLCALC, TRIG, CHOLHDL, LDLDIRECT in the last 72 hours. Thyroid  Function Tests: No results for input(s): TSH, T4TOTAL, FREET4, T3FREE, THYROIDAB in the last 72 hours. Anemia Panel: Recent Labs    02/18/24 1329  VITAMINB12 622   Sepsis Labs: Recent Labs  Lab 02/17/24 0921 02/18/24 0523  PROCALCITON  --  <0.10  LATICACIDVEN 0.7  --     Recent Results (from the past 240 hours)  Resp panel by RT-PCR (RSV, Flu A&B, Covid) Anterior Nasal Swab     Status: None   Collection Time: 02/17/24  9:09 AM   Specimen: Anterior Nasal Swab  Result Value Ref Range Status   SARS Coronavirus 2 by RT PCR NEGATIVE NEGATIVE Final    Comment:  (NOTE) SARS-CoV-2 target nucleic acids are NOT DETECTED.  The SARS-CoV-2 RNA is generally detectable in upper respiratory specimens during the acute phase of infection. The lowest concentration of SARS-CoV-2 viral copies this assay can detect is 138 copies/mL. A negative result does not preclude SARS-Cov-2 infection and should not be used as the sole basis for treatment or other patient management decisions. A negative result may occur with  improper specimen collection/handling, submission of specimen other than nasopharyngeal swab, presence of viral mutation(s) within the areas targeted by this assay, and inadequate number of viral copies(<138 copies/mL). A negative result must be combined with clinical observations, patient history, and epidemiological information. The expected result is Negative.  Fact Sheet for Patients:  BloggerCourse.com  Fact Sheet for Healthcare Providers:  SeriousBroker.it  This test is no t yet approved or  cleared by the [place]  FDA and  has been authorized for detection and/or diagnosis of SARS-CoV-2 by FDA under an Emergency Use Authorization (EUA). This EUA will remain  in effect (meaning this test can be used) for the duration of the COVID-19 declaration under Section 564(b)(1) of the Act, 21 U.S.C.section 360bbb-3(b)(1), unless the authorization is terminated  or revoked sooner.       Influenza A by PCR NEGATIVE NEGATIVE Final   Influenza B by PCR NEGATIVE NEGATIVE Final    Comment: (NOTE) The Xpert Xpress SARS-CoV-2/FLU/RSV plus assay is intended as an aid in the diagnosis of influenza from Nasopharyngeal swab specimens and should not be used as a sole basis for treatment. Nasal washings and aspirates are unacceptable for Xpert Xpress SARS-CoV-2/FLU/RSV testing.  Fact Sheet for Patients: BloggerCourse.com  Fact Sheet for Healthcare  Providers: SeriousBroker.it  This test is not yet approved or cleared by the [place]  FDA and has been authorized for detection and/or diagnosis of SARS-CoV-2 by FDA under an Emergency Use Authorization (EUA). This EUA will remain in effect (meaning this test can be used) for the duration of the COVID-19 declaration under Section 564(b)(1) of the Act, 21 U.S.C. section 360bbb-3(b)(1), unless the authorization is terminated or revoked.     Resp Syncytial Virus by PCR NEGATIVE NEGATIVE Final    Comment: (NOTE) Fact Sheet for Patients: BloggerCourse.com  Fact Sheet for Healthcare Providers: SeriousBroker.it  This test is not yet approved or cleared by the [place]  FDA and has been authorized for detection and/or diagnosis of SARS-CoV-2 by FDA under an Emergency Use Authorization (EUA). This EUA will remain in effect (meaning this test can be used) for the duration of the COVID-19 declaration under Section 564(b)(1) of the Act, 21 U.S.C. section 360bbb-3(b)(1), unless the authorization is terminated or revoked.  Performed at [facility], [address], [place], [place] [address]   Respiratory (~20 pathogens) panel by PCR     Status: None   Collection Time: 02/17/24  9:09 AM   Specimen: Nasopharyngeal Swab; Respiratory  Result Value Ref Range Status   Adenovirus NOT DETECTED NOT DETECTED Final   Coronavirus 229E NOT DETECTED NOT DETECTED Final    Comment: (NOTE) The Coronavirus on the Respiratory Panel, DOES NOT test for the novel  Coronavirus (2019 nCoV)    Coronavirus HKU1 NOT DETECTED NOT DETECTED Final   Coronavirus NL63 NOT DETECTED NOT DETECTED Final   Coronavirus OC43 NOT DETECTED NOT DETECTED Final   Metapneumovirus NOT DETECTED NOT DETECTED Final   Rhinovirus / Enterovirus NOT DETECTED NOT DETECTED Final   Influenza A NOT DETECTED NOT DETECTED Final   Influenza B  NOT DETECTED NOT DETECTED Final   Parainfluenza Virus 1 NOT DETECTED NOT DETECTED Final   Parainfluenza Virus 2 NOT DETECTED NOT DETECTED Final   Parainfluenza Virus 3 NOT DETECTED NOT DETECTED Final   Parainfluenza Virus 4 NOT DETECTED NOT DETECTED Final   Respiratory Syncytial Virus NOT DETECTED NOT DETECTED Final   Bordetella pertussis NOT DETECTED NOT DETECTED Final   Bordetella Parapertussis NOT DETECTED NOT DETECTED Final   Chlamydophila pneumoniae NOT DETECTED NOT DETECTED Final   Mycoplasma pneumoniae NOT DETECTED NOT DETECTED Final    Comment: Performed at [facility], [address] [place], [place] [address]  Blood culture (routine x 2)     Status: None (Preliminary result)   Collection Time: 02/17/24  9:21 AM   Specimen: BLOOD LEFT ARM  Result Value Ref Range Status  Specimen Description   Final    BLOOD LEFT ARM Performed at [facility], [address], [place], [place] [address]    Special Requests   Final    BOTTLES DRAWN AEROBIC AND ANAEROBIC Blood Culture adequate volume Performed at [facility], [address] [place], [place] [address]    Culture PENDING  Incomplete   Report Status PENDING  Incomplete  MRSA Next Gen by PCR, Nasal     Status: None   Collection Time: 02/17/24  8:17 PM   Specimen: Nasal Mucosa; Nasal Swab  Result Value Ref Range Status   MRSA by PCR Next Gen NOT DETECTED NOT DETECTED Final    Comment: (NOTE) The GeneXpert MRSA Assay (FDA approved for NASAL specimens only), is one component of a comprehensive MRSA colonization surveillance program. It is not intended to diagnose MRSA infection nor to guide or monitor treatment for MRSA infections. Test performance is not FDA approved in patients less than 59 years old. Performed at [facility], [address], [place], [place] [address]          Radiology Studies: CT Angio Chest Pulmonary Embolism (PE) W or WO Contrast Result Date:  02/17/2024 EXAM: CTA CHEST 02/17/2024 07:21:19 PM TECHNIQUE: CTA of the chest was performed without and with the administration of 75 mL of iohexol  (OMNIPAQUE ) 350 MG/ML injection. Multiplanar reformatted images are provided for review. MIP images are provided for review. Automated exposure control, iterative reconstruction, and/or weight based adjustment of the mA/kV was utilized to reduce the radiation dose to as low as reasonably achievable. COMPARISON: Chest x-ray and CT chest 10/16/2023 negative for pulmonary embolism. CLINICAL HISTORY: Pulmonary embolism (PE) suspected, high prob. PE suspected. Shortness of breath. FINDINGS: PULMONARY ARTERIES: Pulmonary arteries are adequately opacified for evaluation. No acute pulmonary embolus. Main pulmonary artery is normal in caliber. MEDIASTINUM: Cardiomegaly. Aortic and coronary artery atherosclerotic calcifications. No pericardial effusion. There is no acute abnormality of the thoracic aorta. LYMPH NODES: No mediastinal, hilar or axillary lymphadenopathy. LUNGS AND PLEURA: Bronchial wall thickening with mucus plugging in the lower lobes. Small-to-moderate bilateral pleural effusions and compressive atelectasis in the lower lobes. No zte attenuation of the lungs compatible with air trapping and small airway infection/inflammation. No focal consolidation or pulmonary edema. No pneumothorax. UPPER ABDOMEN: Limited images of the upper abdomen are unremarkable. SOFT TISSUES AND BONES: Numerous vertebral body compression fractures in the thoracic and upper lumbar spine are unchanged from prior. Vertebroplasty of T12, L1, and L2. Remote sternal fracture. No acute soft tissue abnormality. IMPRESSION: 1. No pulmonary embolism. 2. Pulmonary findings compatible with airway infection/inflammation and air trapping. 3. Small-to-moderate bilateral pleural effusions with lower-lobe compressive atelectasis. Electronically signed by: Norman Gatlin MD 02/17/2024 07:43 PM EDT RP  Workstation: [id number]   DG Chest Portable 1 View Result Date: 02/17/2024 CLINICAL DATA:  One 89 year old female with shortness of breath. EXAM: PORTABLE CHEST 1 VIEW COMPARISON:  Chest CT 10/16/2023 and earlier. FINDINGS: Portable AP semi upright view at 0926 hours. Stable cardiomegaly since May. Calcified aortic atherosclerosis. Other mediastinal contours are within normal limits. Visualized tracheal air column is within normal limits. Stable lung volumes. Small pleural effusions at that time likely persist. Increased generalized pulmonary vascular congestion from previous exams. No pneumothorax. No consolidation. Stable visualized osseous structures. Multilevel previous spinal compression fractures and augmentation. Paucity of bowel gas the visible abdomen. IMPRESSION: 1. Chronic cardiomegaly with suspected acute pulmonary interstitial edema. Viral/atypical respiratory infection felt less likely. Small chronic  pleural effusions suspected. 2. Chronic compression fractures and augmentation. Electronically Signed   By: VEAR Hurst M.D.   On: 02/17/2024 09:49        Scheduled Meds:  azithromycin  500 mg Oral Daily   enoxaparin  (LOVENOX ) injection  40 mg Subcutaneous Q24H   furosemide  20 mg Intravenous Daily   ipratropium-albuterol  3 mL Nebulization TID   pantoprazole   40 mg Oral Daily   Continuous Infusions:   LOS: 1 day    Time spent: 35 minutes    Zanna Hawn A Janilah Hojnacki, MD [facility]   If 7PM-7AM, please contact night-coverage [email or web address]  02/19/2024, 7:57 AM

## 2024-02-19 NOTE — Evaluation (Signed)
 Occupational Therapy Evaluation Patient Details Name: Lauren Davidson MRN: [id number] DOB: [birth year] Today's Date: 02/19/2024   History of Present Illness   Pt is a 89 y.o. female admitted with acute hypoxic respiratory failure likely due to acute heart failure with preserved EF.  PMH significant for heart failure with preserved EF, lumbar compression fractures, hyperlipidemia, and L IM nail.     Clinical Impressions Pt lying in bed comfortably, c/o back pain with movement. Pt lives at [facility], PLOF needs help with ADLs, uses w/c for mobility. Pt currently with BLE weakness, decreased activity tolerance. Pt able to transfer to recliner with mod A for STS and min A and increased time for step pivot to recliner. Pt can doff BLE socks, but needs help donning socks. Pt with B shoulder stiffness but overall able to complete UB ADLs with set up/min A. Pt at this time would benefit from postacute rehab <3hrs/day to maximize functional strength and improve with mobility, will continue to follow acutely to progress as able.      If plan is discharge home, recommend the following:   A lot of help with walking and/or transfers;A lot of help with bathing/dressing/bathroom;Assistance with cooking/housework;Assist for transportation     Functional Status Assessment   Patient has had a recent decline in their functional status and demonstrates the ability to make significant improvements in function in a reasonable and predictable amount of time.     Equipment Recommendations   Other (comment) (defer)     Recommendations for Other Services         Precautions/Restrictions   Precautions Precautions: Fall Recall of Precautions/Restrictions: Intact Precaution/Restrictions Comments: spinal for comfort (history of compression fractures and chronic back pain) Restrictions Weight Bearing Restrictions Per Provider Order: No     Mobility Bed Mobility Overal bed mobility: Needs  Assistance Bed Mobility: Supine to Sit     Supine to sit: Mod assist, HOB elevated, Used rails     General bed mobility comments: mod A to assist to EOB, using bed pad to pull Pt to EOB    Transfers Overall transfer level: Needs assistance Equipment used: Rolling walker (2 wheels) Transfers: Sit to/from Stand, Bed to chair/wheelchair/BSC Sit to Stand: Mod assist     Step pivot transfers: Min assist     General transfer comment: mod A to power STS, min A for step pivot to recliner, increased time      Balance Overall balance assessment: Needs assistance Sitting-balance support: No upper extremity supported, Feet supported Sitting balance-Leahy Scale: Fair Sitting balance - Comments: EOB ADLs   Standing balance support: Bilateral upper extremity supported, During functional activity Standing balance-Leahy Scale: Poor Standing balance comment: reliant on RW for support                           ADL either performed or assessed with clinical judgement   ADL Overall ADL's : Needs assistance/impaired Eating/Feeding: Set up;Sitting   Grooming: Set up;Sitting   Upper Body Bathing: Minimal assistance;Sitting   Lower Body Bathing: Moderate assistance;Sitting/lateral leans;Sit to/from stand   Upper Body Dressing : Minimal assistance;Sitting   Lower Body Dressing: Maximal assistance;Sitting/lateral leans;Sit to/from stand   Toilet Transfer: Moderate assistance;Rolling walker (2 wheels);BSC/3in1   Toileting- Clothing Manipulation and Hygiene: Moderate assistance;Sitting/lateral lean;Sit to/from stand       Functional mobility during ADLs: Minimal assistance;Rolling walker (2 wheels) General ADL Comments: Pt min/mod A for transfer from bed to recliner, small steps,  increased time. Pt able to doff socks but needs help donning.     Vision Baseline Vision/History: 6 Macular Degeneration Ability to See in Adequate Light: 1 Impaired Patient Visual Report: No  change from baseline       Perception         Praxis         Pertinent Vitals/Pain Pain Assessment Pain Assessment: Faces Faces Pain Scale: Hurts little more Pain Location: back Pain Descriptors / Indicators: Grimacing Pain Intervention(s): Limited activity within patient's tolerance     Extremity/Trunk Assessment Upper Extremity Assessment Upper Extremity Assessment: Overall WFL for tasks assessed   Lower Extremity Assessment Lower Extremity Assessment: Defer to PT evaluation       Communication Communication Communication: Impaired Factors Affecting Communication: Hearing impaired   Cognition Arousal: Alert Behavior During Therapy: WFL for tasks assessed/performed Cognition: No apparent impairments                               Following commands: Intact       Cueing  General Comments   Cueing Techniques: Verbal cues;Visual cues;Tactile cues      Exercises     Shoulder Instructions      Home Living Family/patient expects to be discharged to:: Assisted living                             Home Equipment: Rollator (4 wheels);Grab bars - tub/shower;Shower seat;Grab bars - toilet;Wheelchair - manual   Additional Comments: resides at [facility]      Prior Functioning/Environment Prior Level of Function : Needs assist             Mobility Comments: amb with RW  in room 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 ADLs Comments: some assistance for bathing/dressing    OT Problem List: Decreased strength;Decreased range of motion;Decreased activity tolerance;Impaired balance (sitting and/or standing);Pain   OT Treatment/Interventions: Self-care/ADL training;Therapeutic exercise;Energy conservation;DME and/or AE instruction;Therapeutic activities;Patient/family education;Balance training      OT  Goals(Current goals can be found in the care plan section)   Acute Rehab OT Goals Patient Stated Goal: to go to [facility] rehab OT Goal Formulation: With patient Time For Goal Achievement: 03/04/24 Potential to Achieve Goals: Good   OT Frequency:  Min 2X/week    Co-evaluation              AM-PAC OT 6 Clicks Daily Activity     Outcome Measure Help from another person eating meals?: A Little Help from another person taking care of personal grooming?: A Little Help from another person toileting, which includes using toliet, bedpan, or urinal?: A Lot Help from another person bathing (including washing, rinsing, drying)?: A Lot Help from another person to put on and taking off regular upper body clothing?: A Little Help from another person to put on and taking off regular lower body clothing?: A Lot 6 Click Score: 15   End of Session Equipment Utilized During Treatment: Gait belt;Rolling walker (2 wheels) Nurse Communication: Mobility status  Activity Tolerance: Patient tolerated treatment well Patient left: in chair;with call bell/phone within reach  OT Visit Diagnosis: Unsteadiness on feet (R26.81);Other abnormalities of gait and mobility (R26.89);Muscle weakness (generalized) (M62.81);Pain Pain - part of body:  (back)  Time: 8868-8841 OT Time Calculation (min): 27 min Charges:  OT General Charges $OT Visit: 1 Visit OT Evaluation $OT Eval Moderate Complexity: 1 Mod OT Treatments $Self Care/Home Management : 8-22 mins  [address] OTR/L   Elouise JONELLE Bott 02/19/2024, 12:04 PM

## 2024-02-19 NOTE — Progress Notes (Signed)
 I attest to student documentation.  Cherye Gaertner, MSN-RN Nursing Faculty/Clinical Instructor [facility]

## 2024-02-19 NOTE — Plan of Care (Signed)
 ?  Problem: Clinical Measurements: ?Goal: Will remain free from infection ?Outcome: Progressing ?  ?

## 2024-02-19 NOTE — Consult Note (Signed)
 Consultation Note Date: 02/19/2024   Patient Name: Lauren Davidson  DOB: 1921-10-11  MRN: [id number]  Age / Sex: 89 y.o., female  PCP: Dwight Trula SQUIBB, MD Referring Physician: Madelyne Owen LABOR, MD  Reason for Consultation: goals of care, family asking for palliative care.  HPI/Patient Profile: 89 y.o. female  with past medical history of heart failure with preserved ejection fraction, lumbar compression fracture, HLD, admitted on 02/17/2024 with respiratory failure and lower extremity swelling due to heart failure exacerbation, and airway infection. She is being treated with IV lasix, azithromycin and nebulizers.  Palliative medicine consulted for goals of care per family request.   Primary Decision Maker PATIENT- surrogate is son- Charlena who is HCPOA  Discussion: Chart reviewed including labs, progress notes, imaging from this and previous encounters. Previously admitted 5/29-6/5 for fall resulting in rib fractures.  Attending note reviewed for history. CT scan reviewed- no PE, noted airway inflammation. Labs show stable renal function. CBC with normal WBC and Hgb. Procalcitonin not indicating bacterial infection.  On evaluation Alaysha is awake, alert, sitting up in chair eating. She is very hard of hearing despite hearing aid in place.  Her daughter's sister in law, Holley was also present for visit.  She resides at [facility]. She enjoys social functions there but feels the care is sometimes inadequate.  She just celebrated her 89th birthday with a large party.  She has a daughter, Tish who lives locally, and son, Charlena who lives in [place] . Her son is her HCPOA, her son in law Helayne is her financial POA.  I introduced Palliative medicine. Palliative medicine is specialized medical care for people living with serious illness. It focuses on providing relief from the symptoms and stress of a serious illness. The  goal is to improve quality of life for both the patient and the family. Answered her questions about Palliative medicine and our services.  She agreed to scheduling meeting with her family and requested Tish and Helayne be present with Tom on speakerphone.  She noted that it was very important that she be spoken to directly. Even though she is hard of hearing she wants to be included in all discussions and decisions regarding her healthcare.   I called and spoke to [facility], explained Palliative consult. He requested that I call Helayne to schedule a time and he would make himself available.  I called Helayne, explained Palliative consult. Answered his questions regarding purpose of Palliative meeting. Helayne noted that they have been concerned about patient's recent decline in cognition and functional status. Helayne inquired if meeting would impact recommendations for discharge to SNF.   I shared that we would include in discussion with patient if going to rehab was within her goals.   SUMMARY OF RECOMMENDATIONS -Respiratory failure in setting of HFpEF- continue current interventions -Plan for family meeting tomorrow at 1pm  Code Status/Advance Care Planning:   Code Status: Limited: Do not attempt resuscitation (DNR) -DNR-LIMITED -Do Not Intubate/DNI     Prognosis:   Unable to determine  Discharge Planning: To Be Determined  Primary Diagnoses: Present on Admission: **None**   Review of Systems  Constitutional:  Positive for activity change.  Respiratory:  Positive for shortness of breath.     Physical Exam Vitals and nursing note reviewed.  Constitutional:      General: She is not in acute distress. Cardiovascular:     Rate and Rhythm: Normal rate.  Pulmonary:     Effort: Pulmonary effort is normal.  Neurological:     Mental Status: She is alert and oriented to person, place, and time.     Vital Signs: BP (!) 150/64 (BP Location: Right Arm)   Pulse 71   Temp 97.6 F (36.4 C)  (Oral)   Resp 18   Ht 5' 5 (1.651 m)   Wt 58.9 kg   SpO2 96%   BMI 21.61 kg/m  Pain Scale: 0-10   Pain Score: 0-No pain   SpO2: SpO2: 96 % O2 Device:SpO2: 96 % O2 Flow Rate: .O2 Flow Rate (L/min): 2 L/min  IO: Intake/output summary:  Intake/Output Summary (Last 24 hours) at 02/19/2024 1310 Last data filed at 02/19/2024 0500 Gross per 24 hour  Intake --  Output 800 ml  Net -800 ml    LBM: Last BM Date : 02/18/24 Baseline Weight: Weight: 54.4 kg Most recent weight: Weight: 58.9 kg       Thank you for this consult. Palliative medicine will continue to follow and assist as needed.   Signed by: Cassondra Stain, AGNP-C Palliative Medicine  Time includes:   Preparing to see the patient (e.g., review of tests) Obtaining and/or reviewing separately obtained history Performing a medically necessary appropriate examination and/or evaluation Counseling and educating the patient/family/caregiver Ordering medications, tests, or procedures Referring and communicating with other health care professionals (when not reported separately) Documenting clinical information in the electronic or other health record Independently interpreting results (not reported separately) and communicating results to the patient/family/caregiver Care coordination (not reported separately) Clinical documentation   Please contact Palliative Medicine Team phone at [phone] for questions and concerns.  For individual provider: See Tracey

## 2024-02-19 NOTE — Plan of Care (Signed)

## 2024-02-20 DIAGNOSIS — Z515 Encounter for palliative care: Secondary | ICD-10-CM | POA: Diagnosis not present

## 2024-02-20 DIAGNOSIS — J40 Bronchitis, not specified as acute or chronic: Secondary | ICD-10-CM | POA: Diagnosis not present

## 2024-02-20 DIAGNOSIS — I5031 Acute diastolic (congestive) heart failure: Secondary | ICD-10-CM | POA: Diagnosis not present

## 2024-02-20 DIAGNOSIS — J9601 Acute respiratory failure with hypoxia: Secondary | ICD-10-CM | POA: Diagnosis not present

## 2024-02-20 MED ORDER — ACETAMINOPHEN 325 MG PO TABS
650.0000 mg | ORAL_TABLET | Freq: Three times a day (TID) | ORAL | Status: DC
Start: 2024-02-20 — End: 2024-02-23
  Administered 2024-02-20 – 2024-02-23 (×9): 650 mg via ORAL
  Filled 2024-02-20 (×9): qty 2

## 2024-02-20 MED ORDER — ALUM & MAG HYDROXIDE-SIMETH 200-200-20 MG/5ML PO SUSP
15.0000 mL | ORAL | Status: DC | PRN
  Administered 2024-02-20: 15 mL via ORAL
  Filled 2024-02-20: qty 30

## 2024-02-20 MED ORDER — AYR SALINE NASAL NA GEL
1.0000 | NASAL | Status: DC
  Administered 2024-02-20 – 2024-02-23 (×15): 1 via NASAL
  Filled 2024-02-20: qty 1

## 2024-02-20 MED ORDER — IPRATROPIUM-ALBUTEROL 0.5-2.5 (3) MG/3ML IN SOLN
3.0000 mL | Freq: Two times a day (BID) | RESPIRATORY_TRACT | Status: DC
Start: 2024-02-20 — End: 2024-02-21
  Administered 2024-02-20: 3 mL via RESPIRATORY_TRACT
  Filled 2024-02-20 (×2): qty 3

## 2024-02-20 NOTE — NC FL2 (Signed)
 [place]  MEDICAID FL2 LEVEL OF CARE FORM     IDENTIFICATION  Patient Name: Lauren Davidson Birthdate: 16-Mar-1922 Sex: female Admission Date (Current Location): 02/17/2024  [facility] and [place] Number:  Producer, television/film/video and Address:  [facility],  [address], [place] [address]      Provider Number: [id number]  Attending Physician Name and Address:  Leotis Bogus, MD  Relative Name and Phone Number:  EVALINE [facility]), HAROLD (Relative)  [phone]    Current Level of Care: Hospital Recommended Level of Care: Skilled Nursing Facility Prior Approval Number:    Date Approved/Denied:   PASRR Number: [id number]  Discharge Plan: SNF    Current Diagnoses: Patient Active Problem List   Diagnosis Date Noted   Acute CHF (congestive heart failure) (HCC) 02/17/2024   Fall at home, initial encounter 10/22/2023   Frail elderly 10/22/2023   DNR (do not resuscitate)/DNI(Do Not Intubate) 10/22/2023   Closed fracture of multiple ribs of right side 10/16/2023   HOH (hard of hearing) 05/16/2023   Back pain 09/17/2021   Lumbar spinal stenosis 09/17/2021   HTN (hypertension) 09/17/2021   HLD (hyperlipidemia) 09/17/2021   Gastroesophageal reflux disease without esophagitis 03/01/2019   Sjogren's syndrome 05/18/2015   Chronic kidney disease (CKD), stage II (mild) 05/18/2015   Generalized anxiety disorder 05/18/2015   Osteoporosis 05/18/2015   Status post intraocular lens implant 11/14/2011    Orientation RESPIRATION BLADDER Height & Weight     Self, Time, Situation, Place  O2 (2L via [place]) Incontinent Weight: 58 kg Height:  5' 5 (165.1 cm)  BEHAVIORAL SYMPTOMS/MOOD NEUROLOGICAL BOWEL NUTRITION STATUS      Incontinent Diet (Heart Healthy)  AMBULATORY STATUS COMMUNICATION OF NEEDS Skin   Limited Assist Verbally Normal                       Personal Care Assistance Level of Assistance  Bathing, Feeding, Dressing Bathing Assistance: Maximum  assistance Feeding assistance: Independent Dressing Assistance: Maximum assistance     Functional Limitations Info  Sight, Hearing, Speech Sight Info: Impaired (eyeglasses) Hearing Info: Impaired (hard-of-hearing) Speech Info: Adequate    SPECIAL CARE FACTORS FREQUENCY  PT (By licensed PT), OT (By licensed OT)     PT Frequency: 5x per week OT Frequency: 5x per week            Contractures Contractures Info: Not present    Additional Factors Info  Code Status, Allergies Code Status Info: DNR Allergies Info: Statins, Buprenorphine Hcl, Buspirone, Lexapro  (Escitalopram ), Meperidine, Morphine And Codeine           Current Medications (02/20/2024):  This is the current hospital active medication list Current Facility-Administered Medications  Medication Dose Route Frequency Provider Last Rate Last Admin   acetaminophen  (TYLENOL ) tablet 1,000 mg  1,000 mg Oral Q6H PRN Regalado, Belkys A, MD   1,000 mg at 02/20/24 0947   Or   acetaminophen  (TYLENOL ) suppository 650 mg  650 mg Rectal Q6H PRN Regalado, Belkys A, MD       albuterol (PROVENTIL) (2.5 MG/3ML) 0.083% nebulizer solution 2.5 mg  2.5 mg Nebulization Q2H PRN Zella, Mir M, MD       docusate sodium  (COLACE) capsule 100 mg  100 mg Oral Daily PRN Chavez, Abigail, NP   100 mg at 02/19/24 2153   enoxaparin  (LOVENOX ) injection 40 mg  40 mg Subcutaneous Q24H Zella, Mir M, MD   40 mg at 02/19/24 1347   furosemide (LASIX) injection 20 mg  20 mg Intravenous Daily Regalado, Belkys A, MD   20 mg at 02/20/24 0947   guaiFENesin (MUCINEX) 12 hr tablet 1,200 mg  1,200 mg Oral BID Regalado, Belkys A, MD   1,200 mg at 02/20/24 0947   ipratropium-albuterol (DUONEB) 0.5-2.5 (3) MG/3ML nebulizer solution 3 mL  3 mL Nebulization BID Leotis Bogus, MD       ondansetron  (ZOFRAN ) tablet 4 mg  4 mg Oral Q6H PRN Zella, Mir M, MD       Or   ondansetron  (ZOFRAN ) injection 4 mg  4 mg Intravenous Q6H PRN Zella, Mir M, MD        oxyCODONE  (Oxy IR/ROXICODONE ) immediate release tablet 2.5 mg  2.5 mg Oral Q4H PRN Zella, Mir M, MD   2.5 mg at 02/19/24 0247   pantoprazole  (PROTONIX ) EC tablet 40 mg  40 mg Oral Daily Ikramullah, Mir M, MD   40 mg at 02/20/24 0948   traZODone (DESYREL) tablet 25 mg  25 mg Oral QHS PRN Zella Katha HERO, MD         Discharge Medications: Please see discharge summary for a list of discharge medications.  Relevant Imaging Results:  Relevant Lab Results:   Additional Information SSN: [id number]  Alfonse JONELLE Rex, RN

## 2024-02-20 NOTE — TOC Progression Note (Addendum)
 Transition of Care [facility]) - Progression Note    Patient Details  Name: Lauren Davidson MRN: [id number] Date of Birth: [birth year]  [facility] CM/SW Contact  Alfonse JONELLE Rex, RN Phone Number: 02/20/2024, 12:49 PM  Clinical Narrative:   Call to patient's son in law/HCPOA,  Helayne Grand at [phone], introduced self and PT recommendation for short term rehab/SNF, reviewed current bed offers [facility] Helayne states patient has been to [facility] in the past, reports he was on his way out the door to the meet with PA in patient's hospital room regarding Palliative Care. Helayne states he will think about SNF bed offers and get back to NCM.   -2:30pm  Met with patient, son in law  [place], (HCPOA) and other family members at bedside and on mobile speaker phone, reviewed SNF bed offers again, family request NCM to find out if [name] has private rooms only. NCM called to [facility] confirmed facility has both private and semi private rooms, depends on availability at time of discharge, family notfiied. Family will notify NCM with bed choice .  -2:46pm TOC consult received for outpatient Palliative Care. NCM called  [facility] at [facility], confirmed facility uses [facility] and  [facility]. Will review with family once bed choice made.   -3:00pm Call received from Mr Grand, confirmed accepted bed offer at [facility], admit coord w/SNF notfiied. Reviewed outpatient palliative referral ( [facility] or [facility] ), family will think it about it and inform CM when decision.   -3:30pm SNF auth initiated via [employer], Reference Number [id number], auth pending.         Expected Discharge Plan: Assisted Living Barriers to Discharge: Continued Medical Work up               Expected Discharge Plan and Services In-house Referral: Clinical Social Work Discharge Planning Services: NA Post Acute Care Choice: NA Living arrangements for the past 2 months: Assisted Living  Facility                 DME Arranged: N/A DME Agency: NA       HH Arranged: NA HH Agency: NA         Social Drivers of Health (SDOH) Interventions SDOH Screenings   Food Insecurity: No Food Insecurity (02/17/2024)  Housing: Low Risk  (02/17/2024)  Transportation Needs: No Transportation Needs (02/17/2024)  Utilities: Not At Risk (02/17/2024)  Social Connections: Moderately Integrated (02/17/2024)  Tobacco Use: Low Risk  (02/17/2024)    Readmission Risk Interventions     No data to display

## 2024-02-20 NOTE — Progress Notes (Signed)
 Daily Progress Note   Patient Name: Lauren Davidson       Date: 02/20/2024 DOB: 07/03/1921  Age: 89 y.o. MRN#: [id number] Attending Physician: Leotis Bogus, MD Primary Care Physician: Dwight Trula SQUIBB, MD Admit Date: 02/17/2024  Reason for Consultation/Follow-up: Establishing goals of care  Patient Profile/HPI:  89 y.o. female  with past medical history of heart failure with preserved ejection fraction, lumbar compression fracture, HLD, Sjogren's disease, admitted on 02/17/2024 with respiratory failure and lower extremity swelling due to heart failure exacerbation, and airway infection. She is being treated with IV lasix, azithromycin and nebulizers.  Palliative medicine consulted for goals of care per family request.   Subjective: Chart reviewed including labs, progress notes, imaging from this and previous encounters.  Per attending note pt continues on IV lasix. Renal function is stable with normal Cr.  Per TOC pt has bed offers for rehab.  Met with patient, her daughter and son in law at the bedside, and her son and daughter in law by phone.  Patient was awake, alert and oriented. She is very hard of hearing- requires use of slow speech, short sentences and loud voice. We discussed her acute and chronic illness. She has had ongoing decline.  Prior to admission she was living in assisted living. She managed her own medications. She was mostly using a wheelchair, has had some falls and she has a great fear of falling because of her falls.  We discussed her heart failure and possible trajectories.  Options for continued medical interventions and rehospitalization vs transition to more comfort focused care and hospice were discussed. Hospice services and Palliative services were reviewed.  Goals  of care were reviewed- patient states she wants to keep on keeping on. She is agreeable to going to SNF for rehab with goal being of her being able to transfer to and from her wheelchair safely in hopes of returning to [place].  She has complaints of dry nose and mouth due to her Sjogren's disease. Uses saline gel.  She complains of intermittent pain in her back with movement, and weak knees. These impair her ability to participate with therapy.   Review of Systems  Respiratory:  Positive for cough.      Physical Exam Vitals and nursing note reviewed.  Pulmonary:     Effort: Pulmonary effort  is normal.  Neurological:     Mental Status: She is alert and oriented to person, place, and time.     Comments: Very hard of hearing             Vital Signs: BP 117/62 (BP Location: Left Arm)   Pulse 69   Temp 98.4 F (36.9 C)   Resp 20   Ht 5' 5 (1.651 m)   Wt 58 kg   SpO2 99%   BMI 21.28 kg/m  SpO2: SpO2: 99 % O2 Device: O2 Device: Nasal Cannula O2 Flow Rate: O2 Flow Rate (L/min): 2 L/min  Intake/output summary:  Intake/Output Summary (Last 24 hours) at 02/20/2024 1410 Last data filed at 02/20/2024 1100 Gross per 24 hour  Intake 360 ml  Output 850 ml  Net -490 ml   LBM: Last BM Date : 02/19/24 Baseline Weight: Weight: 54.4 kg Most recent weight: Weight: 58 kg       Palliative Assessment/Data: PPS: 40%      Patient Active Problem List   Diagnosis Date Noted  . Acute CHF (congestive heart failure) (HCC) 02/17/2024  . Fall at home, initial encounter 10/22/2023  . Frail elderly 10/22/2023  . DNR (do not resuscitate)/DNI(Do Not Intubate) 10/22/2023  . Closed fracture of multiple ribs of right side 10/16/2023  . HOH (hard of hearing) 05/16/2023  . Back pain 09/17/2021  . Lumbar spinal stenosis 09/17/2021  . HTN (hypertension) 09/17/2021  . HLD (hyperlipidemia) 09/17/2021  . Gastroesophageal reflux disease without esophagitis 03/01/2019  . Sjogren's syndrome  05/18/2015  . Chronic kidney disease (CKD), stage II (mild) 05/18/2015  . Generalized anxiety disorder 05/18/2015  . Osteoporosis 05/18/2015  . Status post intraocular lens implant 11/14/2011    Palliative Care Assessment & Plan    Assessment/Recommendations/Plan  HFpEF exacerbation, bronchitis- family requested repeat ECHO- discussed with hospitalist, continue current interventions Plan for d/c to SNF for rehab with Palliative Saline gel ordered to apply to each nares q4hours TOC order placed for referral for outpatient Palliative PMT will follow peripherally- please call if acute needs arise   Code Status:   Code Status: Limited: Do not attempt resuscitation (DNR) -DNR-LIMITED -Do Not Intubate/DNI    Prognosis:  Unable to determine  Discharge Planning: Skilled Nursing Facility for rehab with Palliative care service follow-up  Care plan was discussed with patient, family, attending MD and TOC  Thank you for allowing the Palliative Medicine Team to assist in the care of this patient.  Total time:  90 minutes Prolonged billing:  Time includes:   Preparing to see the patient (e.g., review of tests) Obtaining and/or reviewing separately obtained history Performing a medically necessary appropriate examination and/or evaluation Counseling and educating the patient/family/caregiver Ordering medications, tests, or procedures Referring and communicating with other health care professionals (when not reported separately) Documenting clinical information in the electronic or other health record Independently interpreting results (not reported separately) and communicating results to the patient/family/caregiver Care coordination (not reported separately) Clinical documentation  Cassondra Stain, AGNP-C Palliative Medicine   Please contact Palliative Medicine Team phone at [phone] for questions and concerns.

## 2024-02-20 NOTE — Plan of Care (Signed)

## 2024-02-20 NOTE — Progress Notes (Signed)
 Physical Therapy Treatment Patient Details Name: Lauren Davidson MRN: [id number] DOB: July 26, 1921 Today's Date: 02/20/2024   History of Present Illness Pt is a 89 y.o. female admitted with acute hypoxic respiratory failure likely due to acute heart failure with preserved EF.  PMH significant for heart failure with preserved EF, lumbar compression fractures, hyperlipidemia, and L IM nail.    PT Comments  AxO x 3 sharpe Lady for her age.  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  Following all instructions and willing.  Resides at ALF past 1.5 years and prior to that was at [facility]. Pt in bed on 2 lts sats 96% and HR 74 at rest.  Assisted to EOB was difficult.  General bed mobility comments: Max A to assist to EOB, using bed pad to pull Pt to EOB.  Trial RA sats avg 92%.  General transfer comment: Pt stated she is incont so first assisted from elevated bed to [facility] + 2 ModAssist with increased time to complete 1/4 pivot and unsteady esp with back steps.  Poor kyphotic posture.  Max c/o weakness.  Pt did have a BM.  Assisted to rise + 2 side by side and assisted with peri care.  Posterior LOB with static standing.  Poor self correction.  HIGH FALL RISK. General Gait Details: VERY limited amb distance 2 feet + 2 assist with recliner following closely behind.  Max c/o B LE werakness and increased back pain. Positioned in recliner to comfort.  Reapplied 2 lts nasal per Pt request.  How's my oxygen level? asked Pt.  How's my urine look? Asked Pt.  Retired Engineer, civil (consulting) :-) Prior to admit, Pt was living at ALF facility and able to amb short distances with assist and  self propel her wc to the dining room.  LPT has rec Pt will need ST Rehab at SNF to address mobility and functional decline prior to safely returning home.    If plan is discharge home, recommend the following: A lot of help with bathing/dressing/bathroom;A lot of help with walking and/or transfers;Help with stairs or ramp for  entrance;Assist for transportation;Assistance with cooking/housework   Can travel by private vehicle     No  Equipment Recommendations  None recommended by PT    Recommendations for Other Services       Precautions / Restrictions Precautions Precautions: Fall Precaution/Restrictions Comments: spinal for comfort (history of compression fractures and chronic back pain) Restrictions Weight Bearing Restrictions Per Provider Order: No     Mobility  Bed Mobility Overal bed mobility: Needs Assistance Bed Mobility: Supine to Sit     Supine to sit: HOB elevated, Used rails, Max assist     General bed mobility comments: Max A to assist to EOB, using bed pad to pull Pt to EOB    Transfers Overall transfer level: Needs assistance Equipment used: Rolling walker (2 wheels), None Transfers: Sit to/from Stand Sit to Stand: Mod assist, +2 safety/equipment, +2 physical assistance Stand pivot transfers: Mod assist, Max assist, +2 physical assistance, +2 safety/equipment         General transfer comment: Pt stated she is incont so first assisted from elevated bed to [facility] + 2 ModAssist with increased time to complete 1/4 pivot and unsteady esp with back steps.  Poor kyphotic posture.  Max c/o weakness.  Pt did have a BM.  Assisted to rise + 2 side by side and assisted with peri care.  Posterior LOB with static standing.  Poor self correction.  HIGH FALL RISK.    Ambulation/Gait Ambulation/Gait assistance: Mod assist, Max assist, +2 physical assistance, +2 safety/equipment Gait Distance (Feet): 2 Feet Assistive device: Rolling walker (2 wheels) Gait Pattern/deviations: Step-to pattern, Decreased step length - right, Decreased step length - left, Trunk flexed, Narrow base of support Gait velocity: decreased     General Gait Details: VERY limited amb distance 2 feet + 2 assist with recliner following closely behind.  Max c/o B LE werakness and increased back pain.   Stairs              Wheelchair Mobility     Tilt Bed    Modified Rankin (Stroke Patients Only)       Balance                                            Communication Communication Communication: No apparent difficulties Factors Affecting Communication: Hearing impaired  Cognition Arousal: Alert Behavior During Therapy: WFL for tasks assessed/performed   PT - Cognitive impairments: No apparent impairments                       PT - Cognition Comments: AxO x 3 sharpe Lady for her age.  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  Following all instructions and willing.  Resides at ALF past 1.5 years and prior to that was at [facility]. Following commands: Intact      Cueing Cueing Techniques: Verbal cues  Exercises      General Comments        Pertinent Vitals/Pain Pain Assessment Pain Assessment: Faces Faces Pain Scale: Hurts little more Pain Location: back with activity Pain Descriptors / Indicators: Grimacing, Aching Pain Intervention(s): Monitored during session, Repositioned    Home Living                          Prior Function            PT Goals (current goals can now be found in the care plan section) Progress towards PT goals: Progressing toward goals    Frequency    Min 3X/week      PT Plan      Co-evaluation              AM-PAC PT 6 Clicks Mobility   Outcome Measure  Help needed turning from your back to your side while in a flat bed without using bedrails?: A Lot Help needed moving from lying on your back to sitting on the side of a flat bed without using bedrails?: A Lot Help needed moving to and from a bed to a chair (including a wheelchair)?: A Lot Help needed standing up from a chair using your arms (e.g., wheelchair or bedside chair)?: A Lot Help needed to walk in hospital room?: A Lot Help needed climbing 3-5 steps with a railing? : Total 6 Click Score: 11    End of Session Equipment  Utilized During Treatment: Gait belt;Oxygen Activity Tolerance: Patient limited by fatigue Patient left: in chair;with call bell/phone within reach;with chair alarm set Nurse Communication: Mobility status PT Visit Diagnosis: Muscle weakness (generalized) (M62.81);Difficulty in walking, not elsewhere classified (R26.2);Repeated falls (R29.6);Pain     Time: 8490-8464 PT Time Calculation (min) (ACUTE ONLY): 26 min  Charges:    $Gait Training: 8-22 mins $Therapeutic Activity: 8-22  mins PT General Charges $$ ACUTE PT VISIT: 1 Visit                     Katheryn Leap  PTA Acute  Rehabilitation Services Office M-F          [phone]

## 2024-02-20 NOTE — Progress Notes (Signed)
 PROGRESS NOTE    Lauren Davidson  FMW:[id number] DOB: 1921-07-28 DOA: 02/17/2024 PCP: Dwight Trula SQUIBB, MD   Brief Narrative:  This 89 years old female with PMH significant for HFpEF , Lumbar compression fracture, Hyperlipidemia presented with acute hypoxic respiratory failure due to acute heart failure exacerbation with preserved ejection fraction. Has developed worsening shortness of breath over the last few days, She has been also having worsening back pain. Patient also developed worsening lower extremity edema.   Assessment & Plan:   Principal Problem:   Acute CHF (congestive heart failure) (HCC)   Acute on chronic diastolic heart failure : Mild to moderate mitral valve regurgitation, Moderate tricuspid valve regurgitation: Patient presented with elevated BNP, pulmonary interstitial edema, lower extremity edema. Continue IV Lasix 20 mg IV daily.  Strict I's and O's:  negative 500cc Daily weight: 129 Pd    Intake/Output Summary (Last 24 hours) at 02/20/2024 1344 Last data filed at 02/20/2024 1100 Gross per 24 hour  Intake 600 ml  Output 850 ml  Net -250 ml      Acute hypoxic respiratory failure in the setting of heart failure exacerbation; CTA chest: Pulmonary findings compatible with airway infection /inflammation and air trapping. Small-to-moderate bilateral pleural effusions with lower-lobe compressive atelectasis. Continue with DuoNeb and added azithromycin due to CT chest findings. Continue with IV Lasix Continue Guaifenesin.    Hypertension: Hold Norvasc  to avoid hypotension.    Chronic back pain Continue as needed oxycodone    Elevated alkaline phosphatase GGT not significantly elevated. Follow as an outpatient   Generalized weakness:  Suspect related to acute illness. Magnesium and phosphorus. Normal.  -B12: 622   Elevated D dimer: CTA chest negative for PE>  Sjogren's syndrome:  Continue biotene mouth wash.    Estimated body mass index is 21.61 kg/m as  calculated from the following:   Height as of this encounter: 5' 5 (1.651 m).   Weight as of this encounter: 58.9 kg.   DVT prophylaxis: Lovenox  Code Status: DNR Family Communication: No family at bed side. Disposition Plan:    Status is: Inpatient Remains inpatient appropriate because: Severity of illness.   Consultants:  None  Procedures: None  Antimicrobials:  Anti-infectives (From admission, onward)    Start     Dose/Rate Route Frequency Ordered Stop   02/18/24 1215  azithromycin (ZITHROMAX) tablet 500 mg        500 mg Oral Daily 02/18/24 1124 02/20/24 0948      Subjective: Patient was seen and examined at bedside.  Overnight events noted. Patient was lying comfortably,  still has some bibasilar crackles,  She reports feeling better, awaiting placement in the claps.  Objective: Vitals:   02/19/24 2046 02/20/24 0628 02/20/24 0856 02/20/24 1222  BP: (!) 111/55 137/68  117/62  Pulse: 63 (!) 57  69  Resp: 18 20    Temp: 98 F (36.7 C) (!) 97 F (36.1 C)  98.4 F (36.9 C)  TempSrc:  Axillary    SpO2: 100% 99% 96% 99%  Weight:  58 kg    Height:        Intake/Output Summary (Last 24 hours) at 02/20/2024 1344 Last data filed at 02/20/2024 1100 Gross per 24 hour  Intake 600 ml  Output 850 ml  Net -250 ml   Filed Weights   02/19/24 0500 02/19/24 1024 02/20/24 0628  Weight: 58.9 kg 58.9 kg 58 kg    Examination:  General exam: Appears calm and comfortable, deconditioned, not in any acute distress. Respiratory system:  Bibasilar crackles. Respiratory effort normal.  RR 15 Cardiovascular system: S1 & S2 heard, RRR. No JVD, murmurs, rubs, gallops or clicks.  Gastrointestinal system: Abdomen is non distended, soft and non tender.  Normal bowel sounds heard. Central nervous system: Alert and oriented X 3. No focal neurological deficits. Extremities: No edema, no cyanosis, no clubbing. Skin: No rashes, lesions or ulcers Psychiatry: Judgement and insight appear  normal. Mood & affect appropriate.   Data Reviewed: I have personally reviewed following labs and imaging studies  CBC: Recent Labs  Lab 02/17/24 0921 02/18/24 0523  WBC 4.1 4.4  NEUTROABS 2.6  --   HGB 12.2 12.0  HCT 37.4 36.6  MCV 101.1* 100.5*  PLT 241 208   Basic Metabolic Panel: Recent Labs  Lab 02/17/24 0921 02/18/24 0523 02/18/24 1329 02/19/24 0532  NA 134* 135  --  135  K 4.3 4.5  --  4.5  CL 98 99  --  98  CO2 25 26  --  27  GLUCOSE 86 74  --  94  BUN 16 14  --  25*  CREATININE 0.59 0.60  --  0.80  CALCIUM  9.4 9.0  --  8.9  MG  --   --  2.1  --   PHOS  --   --  3.6  --    GFR: Estimated Creatinine Clearance: 33.6 mL/min (by C-G formula based on SCr of 0.8 mg/dL). Liver Function Tests: Recent Labs  Lab 02/17/24 0921 02/18/24 0523  AST 34 30  ALT 15 13  ALKPHOS 353* 306*  BILITOT 0.6 0.7  PROT 7.5 6.2*  ALBUMIN  4.0 3.3*   No results for input(s): LIPASE, AMYLASE in the last 168 hours. No results for input(s): AMMONIA in the last 168 hours. Coagulation Profile: No results for input(s): INR, PROTIME in the last 168 hours. Cardiac Enzymes: No results for input(s): CKTOTAL, CKMB, CKMBINDEX, TROPONINI in the last 168 hours. BNP (last 3 results) Recent Labs    02/17/24 0921  PROBNP 976.0*   HbA1C: No results for input(s): HGBA1C in the last 72 hours. CBG: No results for input(s): GLUCAP in the last 168 hours. Lipid Profile: No results for input(s): CHOL, HDL, LDLCALC, TRIG, CHOLHDL, LDLDIRECT in the last 72 hours. Thyroid  Function Tests: No results for input(s): TSH, T4TOTAL, FREET4, T3FREE, THYROIDAB in the last 72 hours. Anemia Panel: Recent Labs    02/18/24 1329  VITAMINB12 622   Sepsis Labs: Recent Labs  Lab 02/17/24 0921 02/18/24 0523  PROCALCITON  --  <0.10  LATICACIDVEN 0.7  --     Recent Results (from the past 240 hours)  Resp panel by RT-PCR (RSV, Flu A&B, Covid) Anterior Nasal  Swab     Status: None   Collection Time: 02/17/24  9:09 AM   Specimen: Anterior Nasal Swab  Result Value Ref Range Status   SARS Coronavirus 2 by RT PCR NEGATIVE NEGATIVE Final    Comment: (NOTE) SARS-CoV-2 target nucleic acids are NOT DETECTED.  The SARS-CoV-2 RNA is generally detectable in upper respiratory specimens during the acute phase of infection. The lowest concentration of SARS-CoV-2 viral copies this assay can detect is 138 copies/mL. A negative result does not preclude SARS-Cov-2 infection and should not be used as the sole basis for treatment or other patient management decisions. A negative result may occur with  improper specimen collection/handling, submission of specimen other than nasopharyngeal swab, presence of viral mutation(s) within the areas targeted by this assay, and inadequate number of viral copies(<138  copies/mL). A negative result must be combined with clinical observations, patient history, and epidemiological information. The expected result is Negative.  Fact Sheet for Patients:  BloggerCourse.com  Fact Sheet for Healthcare Providers:  SeriousBroker.it  This test is no t yet approved or cleared by the [place]  FDA and  has been authorized for detection and/or diagnosis of SARS-CoV-2 by FDA under an Emergency Use Authorization (EUA). This EUA will remain  in effect (meaning this test can be used) for the duration of the COVID-19 declaration under Section 564(b)(1) of the Act, 21 U.S.C.section 360bbb-3(b)(1), unless the authorization is terminated  or revoked sooner.       Influenza A by PCR NEGATIVE NEGATIVE Final   Influenza B by PCR NEGATIVE NEGATIVE Final    Comment: (NOTE) The Xpert Xpress SARS-CoV-2/FLU/RSV plus assay is intended as an aid in the diagnosis of influenza from Nasopharyngeal swab specimens and should not be used as a sole basis for treatment. Nasal washings and aspirates  are unacceptable for Xpert Xpress SARS-CoV-2/FLU/RSV testing.  Fact Sheet for Patients: BloggerCourse.com  Fact Sheet for Healthcare Providers: SeriousBroker.it  This test is not yet approved or cleared by the [place]  FDA and has been authorized for detection and/or diagnosis of SARS-CoV-2 by FDA under an Emergency Use Authorization (EUA). This EUA will remain in effect (meaning this test can be used) for the duration of the COVID-19 declaration under Section 564(b)(1) of the Act, 21 U.S.C. section 360bbb-3(b)(1), unless the authorization is terminated or revoked.     Resp Syncytial Virus by PCR NEGATIVE NEGATIVE Final    Comment: (NOTE) Fact Sheet for Patients: BloggerCourse.com  Fact Sheet for Healthcare Providers: SeriousBroker.it  This test is not yet approved or cleared by the [place]  FDA and has been authorized for detection and/or diagnosis of SARS-CoV-2 by FDA under an Emergency Use Authorization (EUA). This EUA will remain in effect (meaning this test can be used) for the duration of the COVID-19 declaration under Section 564(b)(1) of the Act, 21 U.S.C. section 360bbb-3(b)(1), unless the authorization is terminated or revoked.  Performed at [facility], [address] [place], [place] [address]   Respiratory (~20 pathogens) panel by PCR     Status: None   Collection Time: 02/17/24  9:09 AM   Specimen: Nasopharyngeal Swab; Respiratory  Result Value Ref Range Status   Adenovirus NOT DETECTED NOT DETECTED Final   Coronavirus 229E NOT DETECTED NOT DETECTED Final    Comment: (NOTE) The Coronavirus on the Respiratory Panel, DOES NOT test for the novel  Coronavirus (2019 nCoV)    Coronavirus HKU1 NOT DETECTED NOT DETECTED Final   Coronavirus NL63 NOT DETECTED NOT DETECTED Final   Coronavirus OC43 NOT DETECTED NOT DETECTED Final    Metapneumovirus NOT DETECTED NOT DETECTED Final   Rhinovirus / Enterovirus NOT DETECTED NOT DETECTED Final   Influenza A NOT DETECTED NOT DETECTED Final   Influenza B NOT DETECTED NOT DETECTED Final   Parainfluenza Virus 1 NOT DETECTED NOT DETECTED Final   Parainfluenza Virus 2 NOT DETECTED NOT DETECTED Final   Parainfluenza Virus 3 NOT DETECTED NOT DETECTED Final   Parainfluenza Virus 4 NOT DETECTED NOT DETECTED Final   Respiratory Syncytial Virus NOT DETECTED NOT DETECTED Final   Bordetella pertussis NOT DETECTED NOT DETECTED Final   Bordetella Parapertussis NOT DETECTED NOT DETECTED Final   Chlamydophila pneumoniae NOT DETECTED NOT DETECTED Final   Mycoplasma pneumoniae NOT DETECTED NOT DETECTED Final    Comment: Performed at [facility], [address] [place], [place] [address]  Blood culture (routine x 2)     Status: None (Preliminary result)   Collection Time: 02/17/24  9:21 AM   Specimen: BLOOD LEFT ARM  Result Value Ref Range Status   Specimen Description   Final    BLOOD LEFT ARM Performed at [facility], [address], [place], [place] [address]    Special Requests   Final    BOTTLES DRAWN AEROBIC AND ANAEROBIC Blood Culture adequate volume Performed at [facility], [address], [place], [place] [address]    Culture PENDING  Incomplete   Report Status PENDING  Incomplete  MRSA Next Gen by PCR, Nasal     Status: None   Collection Time: 02/17/24  8:17 PM   Specimen: Nasal Mucosa; Nasal Swab  Result Value Ref Range Status   MRSA by PCR Next Gen NOT DETECTED NOT DETECTED Final    Comment: (NOTE) The GeneXpert MRSA Assay (FDA approved for NASAL specimens only), is one component of a comprehensive MRSA colonization surveillance program. It is not intended to diagnose MRSA infection nor to guide or monitor treatment for MRSA infections. Test performance is not FDA approved in patients less than 43 years old. Performed at [facility], [address], [place], [place] [address]     Radiology Studies: No results found.  Scheduled Meds:  enoxaparin  (LOVENOX ) injection  40 mg Subcutaneous Q24H   furosemide  20 mg Intravenous Daily   guaiFENesin  1,200 mg Oral BID   ipratropium-albuterol  3 mL Nebulization BID   pantoprazole   40 mg Oral Daily   Continuous Infusions:   LOS: 2 days    Time spent: 50 Mins    Darcel Dawley, MD [facility]   If 7PM-7AM, please contact night-coverage

## 2024-02-20 NOTE — Plan of Care (Signed)

## 2024-02-21 ENCOUNTER — Inpatient Hospital Stay (HOSPITAL_COMMUNITY)

## 2024-02-21 DIAGNOSIS — R0609 Other forms of dyspnea: Secondary | ICD-10-CM | POA: Diagnosis not present

## 2024-02-21 DIAGNOSIS — I5031 Acute diastolic (congestive) heart failure: Secondary | ICD-10-CM | POA: Diagnosis not present

## 2024-02-21 LAB — BASIC METABOLIC PANEL WITH GFR
Anion gap: 10 (ref 5–15)
BUN: 30 mg/dL — ABNORMAL HIGH (ref 8–23)
CO2: 29 mmol/L (ref 22–32)
Calcium: 9.4 mg/dL (ref 8.9–10.3)
Chloride: 95 mmol/L — ABNORMAL LOW (ref 98–111)
Creatinine, Ser: 0.77 mg/dL (ref 0.44–1.00)
GFR, Estimated: >60 mL/min (ref >60)
Glucose, Bld: 88 mg/dL (ref 70–99)
Potassium: 4.4 mmol/L (ref 3.5–5.1)
Sodium: 134 mmol/L — ABNORMAL LOW (ref 135–145)

## 2024-02-21 LAB — ECHOCARDIOGRAM COMPLETE
AR max vel: 1.42 cm2
AV Area VTI: 1.4 cm2
AV Area mean vel: 1.35 cm2
AV Mean grad: 9 mmHg
AV Peak grad: 15.8 mmHg
Ao pk vel: 1.99 m/s
Area-P 1/2: 2.42 cm2
Height: 65 in
MV M vel: 5.16 m/s
MV Peak grad: 106.5 mmHg
S' Lateral: 2.4 cm
Weight: 2070.56 [oz_av]

## 2024-02-21 LAB — CBC
HCT: 39.9 % (ref 36.0–46.0)
Hemoglobin: 12.6 g/dL (ref 12.0–15.0)
MCH: 31.7 pg (ref 26.0–34.0)
MCHC: 31.6 g/dL (ref 30.0–36.0)
MCV: 100.3 fL — ABNORMAL HIGH (ref 80.0–100.0)
Platelets: 217 K/uL (ref 150–400)
RBC: 3.98 MIL/uL (ref 3.87–5.11)
RDW: 13.8 % (ref 11.5–15.5)
WBC: 4.8 K/uL (ref 4.0–10.5)
nRBC: 0 % (ref 0.0–0.2)

## 2024-02-21 LAB — MAGNESIUM: Magnesium: 2.4 mg/dL (ref 1.7–2.4)

## 2024-02-21 LAB — PHOSPHORUS: Phosphorus: 3.8 mg/dL (ref 2.5–4.6)

## 2024-02-21 NOTE — Plan of Care (Signed)
   Problem: Education: Goal: Knowledge of General Education information will improve Description: Including pain rating scale, medication(s)/side effects and non-pharmacologic comfort measures Outcome: Progressing   Problem: Clinical Measurements: Goal: Will remain free from infection Outcome: Progressing   Problem: Clinical Measurements: Goal: Respiratory complications will improve Outcome: Progressing

## 2024-02-21 NOTE — Progress Notes (Signed)
 PROGRESS NOTE    Lauren Davidson  FMW:[id number] DOB: [birth year] DOA: 02/17/2024 PCP: Dwight Trula SQUIBB, MD   Brief Narrative:  This 89 years old female with PMH significant for HFpEF , Lumbar compression fracture, Hyperlipidemia presented with acute hypoxic respiratory failure due to acute heart failure exacerbation with preserved ejection fraction. Has developed worsening shortness of breath over the last few days, She has been also having worsening back pain. Patient also developed worsening lower extremity edema.   Assessment & Plan:   Principal Problem:   Acute CHF (congestive heart failure) (HCC)   Acute on chronic diastolic heart failure: Mild to moderate mitral valve regurgitation,  Moderate tricuspid valve regurgitation: Patient presented with elevated BNP, pulmonary interstitial edema, lower extremity edema. Continue IV Lasix 20 mg IV daily.  Strict I's and O's:  negative 500cc Daily weight: 129 Pd    Intake/Output Summary (Last 24 hours) at 02/21/2024 1304 Last data filed at 02/21/2024 1204 Gross per 24 hour  Intake 200 ml  Output 1200 ml  Net -1000 ml      Acute hypoxic respiratory failure in the setting of heart failure exacerbation; CTA chest: Pulmonary findings compatible with airway infection /inflammation and air trapping. Small-to-moderate bilateral pleural effusions with lower-lobe compressive atelectasis. Continue with DuoNeb and added azithromycin due to CT chest findings. Continue with IV Lasix Continue Guaifenesin.    Hypertension: Hold Norvasc  to avoid hypotension.    Chronic back pain Continue as needed oxycodone .   Elevated alkaline phosphatase GGT not significantly elevated. Follow as an outpatient   Generalized weakness:  Suspect related to acute illness. Magnesium and phosphorus. Normal.  -B12: 622   Elevated D dimer: CTA chest negative for PE>  Sjogren's syndrome:  Continue biotene mouth wash.    Estimated body mass index is 21.61 kg/m  as calculated from the following:   Height as of this encounter: 5' 5 (1.651 m).   Weight as of this encounter: 58.9 kg.   DVT prophylaxis: Lovenox  Code Status: DNR Family Communication: No family at bed side. Disposition Plan:    Status is: Inpatient Remains inpatient appropriate because: Patient is medically ready,  awaiting SNF placement.   Consultants:  None  Procedures: None  Antimicrobials:  Anti-infectives (From admission, onward)    Start     Dose/Rate Route Frequency Ordered Stop   02/18/24 1215  azithromycin (ZITHROMAX) tablet 500 mg        500 mg Oral Daily 02/18/24 1124 02/20/24 0948      Subjective: Patient was seen and examined at bedside.  Overnight events noted. Patient was lying comfortably, very hard of hearing. She reports feeling better, awaiting placement in the claps.  Objective: Vitals:   02/20/24 2016 02/21/24 0500 02/21/24 0547 02/21/24 1156  BP: 129/61  (!) 147/60 136/70  Pulse: 67  (!) 55 87  Resp: 18  18 20   Temp: 98.6 F (37 C)  97.9 F (36.6 C) 98.3 F (36.8 C)  TempSrc:    Oral  SpO2: 96%  100% 99%  Weight:  58.7 kg    Height:        Intake/Output Summary (Last 24 hours) at 02/21/2024 1304 Last data filed at 02/21/2024 1204 Gross per 24 hour  Intake 200 ml  Output 1200 ml  Net -1000 ml   Filed Weights   02/19/24 1024 02/20/24 0628 02/21/24 0500  Weight: 58.9 kg 58 kg 58.7 kg    Examination:  General exam: Appears calm and comfortable, deconditioned, not in any acute distress. Respiratory  system: CTA bilaterally.  Respiratory effort normal.  RR 15 Cardiovascular system: S1 & S2 heard, RRR. No JVD, murmurs, rubs, gallops or clicks.  Gastrointestinal system: Abdomen is non distended, soft and non tender.  Normal bowel sounds heard. Central nervous system: Alert and oriented X 3. No focal neurological deficits. Extremities: No edema, no cyanosis, no clubbing. Skin: No rashes, lesions or ulcers Psychiatry: Judgement and  insight appear normal. Mood & affect appropriate.   Data Reviewed: I have personally reviewed following labs and imaging studies  CBC: Recent Labs  Lab 02/17/24 0921 02/18/24 0523 02/21/24 0624  WBC 4.1 4.4 4.8  NEUTROABS 2.6  --   --   HGB 12.2 12.0 12.6  HCT 37.4 36.6 39.9  MCV 101.1* 100.5* 100.3*  PLT 241 208 217   Basic Metabolic Panel: Recent Labs  Lab 02/17/24 0921 02/18/24 0523 02/18/24 1329 02/19/24 0532 02/21/24 0624  NA 134* 135  --  135 134*  K 4.3 4.5  --  4.5 4.4  CL 98 99  --  98 95*  CO2 25 26  --  27 29  GLUCOSE 86 74  --  94 88  BUN 16 14  --  25* 30*  CREATININE 0.59 0.60  --  0.80 0.77  CALCIUM  9.4 9.0  --  8.9 9.4  MG  --   --  2.1  --  2.4  PHOS  --   --  3.6  --  3.8   GFR: Estimated Creatinine Clearance: 33.6 mL/min (by C-G formula based on SCr of 0.77 mg/dL). Liver Function Tests: Recent Labs  Lab 02/17/24 0921 02/18/24 0523  AST 34 30  ALT 15 13  ALKPHOS 353* 306*  BILITOT 0.6 0.7  PROT 7.5 6.2*  ALBUMIN  4.0 3.3*   No results for input(s): LIPASE, AMYLASE in the last 168 hours. No results for input(s): AMMONIA in the last 168 hours. Coagulation Profile: No results for input(s): INR, PROTIME in the last 168 hours. Cardiac Enzymes: No results for input(s): CKTOTAL, CKMB, CKMBINDEX, TROPONINI in the last 168 hours. BNP (last 3 results) Recent Labs    02/17/24 0921  PROBNP 976.0*   HbA1C: No results for input(s): HGBA1C in the last 72 hours. CBG: No results for input(s): GLUCAP in the last 168 hours. Lipid Profile: No results for input(s): CHOL, HDL, LDLCALC, TRIG, CHOLHDL, LDLDIRECT in the last 72 hours. Thyroid  Function Tests: No results for input(s): TSH, T4TOTAL, FREET4, T3FREE, THYROIDAB in the last 72 hours. Anemia Panel: Recent Labs    02/18/24 1329  VITAMINB12 622   Sepsis Labs: Recent Labs  Lab 02/17/24 0921 02/18/24 0523  PROCALCITON  --  <0.10  LATICACIDVEN  0.7  --     Recent Results (from the past 240 hours)  Resp panel by RT-PCR (RSV, Flu A&B, Covid) Anterior Nasal Swab     Status: None   Collection Time: 02/17/24  9:09 AM   Specimen: Anterior Nasal Swab  Result Value Ref Range Status   SARS Coronavirus 2 by RT PCR NEGATIVE NEGATIVE Final    Comment: (NOTE) SARS-CoV-2 target nucleic acids are NOT DETECTED.  The SARS-CoV-2 RNA is generally detectable in upper respiratory specimens during the acute phase of infection. The lowest concentration of SARS-CoV-2 viral copies this assay can detect is 138 copies/mL. A negative result does not preclude SARS-Cov-2 infection and should not be used as the sole basis for treatment or other patient management decisions. A negative result may occur with  improper specimen collection/handling,  submission of specimen other than nasopharyngeal swab, presence of viral mutation(s) within the areas targeted by this assay, and inadequate number of viral copies(<138 copies/mL). A negative result must be combined with clinical observations, patient history, and epidemiological information. The expected result is Negative.  Fact Sheet for Patients:  BloggerCourse.com  Fact Sheet for Healthcare Providers:  SeriousBroker.it  This test is no t yet approved or cleared by the [place]  FDA and  has been authorized for detection and/or diagnosis of SARS-CoV-2 by FDA under an Emergency Use Authorization (EUA). This EUA will remain  in effect (meaning this test can be used) for the duration of the COVID-19 declaration under Section 564(b)(1) of the Act, 21 U.S.C.section 360bbb-3(b)(1), unless the authorization is terminated  or revoked sooner.       Influenza A by PCR NEGATIVE NEGATIVE Final   Influenza B by PCR NEGATIVE NEGATIVE Final    Comment: (NOTE) The Xpert Xpress SARS-CoV-2/FLU/RSV plus assay is intended as an aid in the diagnosis of influenza  from Nasopharyngeal swab specimens and should not be used as a sole basis for treatment. Nasal washings and aspirates are unacceptable for Xpert Xpress SARS-CoV-2/FLU/RSV testing.  Fact Sheet for Patients: BloggerCourse.com  Fact Sheet for Healthcare Providers: SeriousBroker.it  This test is not yet approved or cleared by the [place]  FDA and has been authorized for detection and/or diagnosis of SARS-CoV-2 by FDA under an Emergency Use Authorization (EUA). This EUA will remain in effect (meaning this test can be used) for the duration of the COVID-19 declaration under Section 564(b)(1) of the Act, 21 U.S.C. section 360bbb-3(b)(1), unless the authorization is terminated or revoked.     Resp Syncytial Virus by PCR NEGATIVE NEGATIVE Final    Comment: (NOTE) Fact Sheet for Patients: BloggerCourse.com  Fact Sheet for Healthcare Providers: SeriousBroker.it  This test is not yet approved or cleared by the [place]  FDA and has been authorized for detection and/or diagnosis of SARS-CoV-2 by FDA under an Emergency Use Authorization (EUA). This EUA will remain in effect (meaning this test can be used) for the duration of the COVID-19 declaration under Section 564(b)(1) of the Act, 21 U.S.C. section 360bbb-3(b)(1), unless the authorization is terminated or revoked.  Performed at [facility], [address], [place], [place] [address]   Respiratory (~20 pathogens) panel by PCR     Status: None   Collection Time: 02/17/24  9:09 AM   Specimen: Nasopharyngeal Swab; Respiratory  Result Value Ref Range Status   Adenovirus NOT DETECTED NOT DETECTED Final   Coronavirus 229E NOT DETECTED NOT DETECTED Final    Comment: (NOTE) The Coronavirus on the Respiratory Panel, DOES NOT test for the novel  Coronavirus (2019 nCoV)    Coronavirus HKU1 NOT DETECTED NOT DETECTED  Final   Coronavirus NL63 NOT DETECTED NOT DETECTED Final   Coronavirus OC43 NOT DETECTED NOT DETECTED Final   Metapneumovirus NOT DETECTED NOT DETECTED Final   Rhinovirus / Enterovirus NOT DETECTED NOT DETECTED Final   Influenza A NOT DETECTED NOT DETECTED Final   Influenza B NOT DETECTED NOT DETECTED Final   Parainfluenza Virus 1 NOT DETECTED NOT DETECTED Final   Parainfluenza Virus 2 NOT DETECTED NOT DETECTED Final   Parainfluenza Virus 3 NOT DETECTED NOT DETECTED Final   Parainfluenza Virus 4 NOT DETECTED NOT DETECTED Final   Respiratory Syncytial Virus NOT DETECTED NOT DETECTED Final   Bordetella pertussis NOT DETECTED NOT DETECTED Final   Bordetella Parapertussis NOT DETECTED NOT DETECTED Final  Chlamydophila pneumoniae NOT DETECTED NOT DETECTED Final   Mycoplasma pneumoniae NOT DETECTED NOT DETECTED Final    Comment: Performed at [facility], [address], [place], [place] [address]  Blood culture (routine x 2)     Status: None (Preliminary result)   Collection Time: 02/17/24  9:21 AM   Specimen: BLOOD LEFT ARM  Result Value Ref Range Status   Specimen Description   Final    BLOOD LEFT ARM Performed at [facility], [address], [place], [place] [address]    Special Requests   Final    BOTTLES DRAWN AEROBIC AND ANAEROBIC Blood Culture adequate volume Performed at [facility], [address], [place], [place] [address]    Culture   Final    NO GROWTH 4 DAYS Performed at [facility], [address] [place], [place] [address]    Report Status PENDING  Incomplete  Blood culture (routine x 2)     Status: None (Preliminary result)   Collection Time: 02/17/24  9:59 AM   Specimen: BLOOD  Result Value Ref Range Status   Specimen Description   Final    BLOOD SITE NOT SPECIFIED Performed at [facility], [address], [place], [place] [address]    Special Requests   Final    BOTTLES DRAWN AEROBIC AND ANAEROBIC  Blood Culture adequate volume Performed at [facility], [address] [place], [place] [address]    Culture   Final    NO GROWTH 4 DAYS Performed at [facility], [address] [place], [place] [address]    Report Status PENDING  Incomplete  MRSA Next Gen by PCR, Nasal     Status: None   Collection Time: 02/17/24  8:17 PM   Specimen: Nasal Mucosa; Nasal Swab  Result Value Ref Range Status   MRSA by PCR Next Gen NOT DETECTED NOT DETECTED Final    Comment: (NOTE) The GeneXpert MRSA Assay (FDA approved for NASAL specimens only), is one component of a comprehensive MRSA colonization surveillance program. It is not intended to diagnose MRSA infection nor to guide or monitor treatment for MRSA infections. Test performance is not FDA approved in patients less than 17 years old. Performed at [facility], [address], [place], [place] [address]     Radiology Studies: No results found.  Scheduled Meds:  acetaminophen   650 mg Oral TID   enoxaparin  (LOVENOX ) injection  40 mg Subcutaneous Q24H   furosemide  20 mg Intravenous Daily   guaiFENesin  1,200 mg Oral BID   pantoprazole   40 mg Oral Daily   saline  1 Application Each Nare Q4H   Continuous Infusions:   LOS: 3 days    Time spent: 35 Mins    Darcel Dawley, MD [facility]   If 7PM-7AM, please contact night-coverage

## 2024-02-21 NOTE — Plan of Care (Signed)

## 2024-02-22 DIAGNOSIS — I5031 Acute diastolic (congestive) heart failure: Secondary | ICD-10-CM | POA: Diagnosis not present

## 2024-02-22 LAB — CULTURE, BLOOD (ROUTINE X 2)
Culture: NO GROWTH
Culture: NO GROWTH
Special Requests: ADEQUATE
Special Requests: ADEQUATE

## 2024-02-22 NOTE — Plan of Care (Signed)
  Problem: Education: Goal: Knowledge of General Education information will improve Description: Including pain rating scale, medication(s)/side effects and non-pharmacologic comfort measures Outcome: Progressing   Problem: Clinical Measurements: Goal: Will remain free from infection Outcome: Progressing Goal: Respiratory complications will improve Outcome: Progressing Goal: Cardiovascular complication will be avoided Outcome: Progressing   Problem: Safety: Goal: Ability to remain free from injury will improve Outcome: Progressing   Problem: Skin Integrity: Goal: Risk for impaired skin integrity will decrease Outcome: Progressing

## 2024-02-22 NOTE — Progress Notes (Signed)
 PROGRESS NOTE    Lauren Davidson  FMW:[id number] DOB: 1921-10-20 DOA: 02/17/2024 PCP: Dwight Trula SQUIBB, MD   Brief Narrative:  This 89 years old female with PMH significant for HFpEF , Lumbar compression fracture, Hyperlipidemia presented with acute hypoxic respiratory failure due to acute heart failure exacerbation with preserved ejection fraction. Has developed worsening shortness of breath over the last few days, She has been also having worsening back pain. Patient also developed worsening lower extremity edema.   Assessment & Plan:   Principal Problem:   Acute CHF (congestive heart failure) (HCC)   Acute on chronic diastolic heart failure: Mild to moderate mitral valve regurgitation,  Moderate tricuspid valve regurgitation: Patient presented with elevated BNP, pulmonary interstitial edema, lower extremity edema. Continue IV Lasix 20 mg IV daily.  Strict I's and O's:  negative 500cc Daily weight: 129 Pd    Intake/Output Summary (Last 24 hours) at 02/22/2024 1511 Last data filed at 02/22/2024 0810 Gross per 24 hour  Intake 330 ml  Output --  Net 330 ml      Acute hypoxic respiratory failure in the setting of heart failure exacerbation; CTA chest: Pulmonary findings compatible with airway infection /inflammation and air trapping. Small-to-moderate bilateral pleural effusions with lower-lobe compressive atelectasis. Continue with DuoNeb and added azithromycin due to CT chest findings. Continue with IV Lasix Continue Guaifenesin.    Hypertension: Hold Norvasc  to avoid hypotension.    Chronic back pain Continue as needed oxycodone .   Elevated alkaline phosphatase GGT not significantly elevated. Follow as an outpatient   Generalized weakness:  Suspect related to acute illness. Magnesium and phosphorus. Normal.  -B12: 622   Elevated D dimer: CTA chest negative for PE>  Sjogren's syndrome:  Continue biotene mouth wash.    Estimated body mass index is 21.61 kg/m as  calculated from the following:   Height as of this encounter: 5' 5 (1.651 m).   Weight as of this encounter: 58.9 kg.   DVT prophylaxis: Lovenox  Code Status: DNR Family Communication: No family at bed side. Disposition Plan:    Status is: Inpatient Remains inpatient appropriate because: Patient is medically ready,  awaiting SNF placement.   Consultants:  None  Procedures: None  Antimicrobials:  Anti-infectives (From admission, onward)    Start     Dose/Rate Route Frequency Ordered Stop   02/18/24 1215  azithromycin (ZITHROMAX) tablet 500 mg        500 mg Oral Daily 02/18/24 1124 02/20/24 0948      Subjective: Patient was seen and examined at bedside.  Overnight events noted. Patient was lying comfortably, very hard of hearing. She reports feeling better, awaiting placement in the claps.  Objective: Vitals:   02/21/24 1927 02/22/24 0449 02/22/24 0500 02/22/24 1256  BP: (!) 149/73 (!) 141/64  123/63  Pulse: 65 (!) 57  60  Resp: 18 16  16   Temp: 97.8 F (36.6 C) 98.3 F (36.8 C)  98 F (36.7 C)  TempSrc:      SpO2: 97% 99%  99%  Weight:   56.6 kg   Height:        Intake/Output Summary (Last 24 hours) at 02/22/2024 1511 Last data filed at 02/22/2024 0810 Gross per 24 hour  Intake 330 ml  Output --  Net 330 ml   Filed Weights   02/20/24 0628 02/21/24 0500 02/22/24 0500  Weight: 58 kg 58.7 kg 56.6 kg    Examination:  General exam: Appears calm and comfortable, deconditioned, not in any acute distress. Respiratory system:  CTA bilaterally.  Respiratory effort normal.  RR 15 Cardiovascular system: S1 & S2 audible and no audible murmur.   Gastrointestinal system: Abdomen is non distended, soft and non tender.  Normal bowel sounds heard. Central nervous system: Alert and oriented X 3. No focal neurological deficits. Extremities: No edema, no cyanosis, no clubbing. Skin: No rashes, lesions or ulcers Psychiatry: Judgement and insight appear normal. Mood & affect  appropriate.   Data Reviewed: I have personally reviewed following labs and imaging studies  CBC: Recent Labs  Lab 02/17/24 0921 02/18/24 0523 02/21/24 0624  WBC 4.1 4.4 4.8  NEUTROABS 2.6  --   --   HGB 12.2 12.0 12.6  HCT 37.4 36.6 39.9  MCV 101.1* 100.5* 100.3*  PLT 241 208 217   Basic Metabolic Panel: Recent Labs  Lab 02/17/24 0921 02/18/24 0523 02/18/24 1329 02/19/24 0532 02/21/24 0624  NA 134* 135  --  135 134*  K 4.3 4.5  --  4.5 4.4  CL 98 99  --  98 95*  CO2 25 26  --  27 29  GLUCOSE 86 74  --  94 88  BUN 16 14  --  25* 30*  CREATININE 0.59 0.60  --  0.80 0.77  CALCIUM  9.4 9.0  --  8.9 9.4  MG  --   --  2.1  --  2.4  PHOS  --   --  3.6  --  3.8   GFR: Estimated Creatinine Clearance: 33.4 mL/min (by C-G formula based on SCr of 0.77 mg/dL). Liver Function Tests: Recent Labs  Lab 02/17/24 0921 02/18/24 0523  AST 34 30  ALT 15 13  ALKPHOS 353* 306*  BILITOT 0.6 0.7  PROT 7.5 6.2*  ALBUMIN  4.0 3.3*   No results for input(s): LIPASE, AMYLASE in the last 168 hours. No results for input(s): AMMONIA in the last 168 hours. Coagulation Profile: No results for input(s): INR, PROTIME in the last 168 hours. Cardiac Enzymes: No results for input(s): CKTOTAL, CKMB, CKMBINDEX, TROPONINI in the last 168 hours. BNP (last 3 results) Recent Labs    02/17/24 0921  PROBNP 976.0*   HbA1C: No results for input(s): HGBA1C in the last 72 hours. CBG: No results for input(s): GLUCAP in the last 168 hours. Lipid Profile: No results for input(s): CHOL, HDL, LDLCALC, TRIG, CHOLHDL, LDLDIRECT in the last 72 hours. Thyroid  Function Tests: No results for input(s): TSH, T4TOTAL, FREET4, T3FREE, THYROIDAB in the last 72 hours. Anemia Panel: No results for input(s): VITAMINB12, FOLATE, FERRITIN, TIBC, IRON , RETICCTPCT in the last 72 hours.  Sepsis Labs: Recent Labs  Lab 02/17/24 0921 02/18/24 0523  PROCALCITON   --  <0.10  LATICACIDVEN 0.7  --     Recent Results (from the past 240 hours)  Resp panel by RT-PCR (RSV, Flu A&B, Covid) Anterior Nasal Swab     Status: None   Collection Time: 02/17/24  9:09 AM   Specimen: Anterior Nasal Swab  Result Value Ref Range Status   SARS Coronavirus 2 by RT PCR NEGATIVE NEGATIVE Final    Comment: (NOTE) SARS-CoV-2 target nucleic acids are NOT DETECTED.  The SARS-CoV-2 RNA is generally detectable in upper respiratory specimens during the acute phase of infection. The lowest concentration of SARS-CoV-2 viral copies this assay can detect is 138 copies/mL. A negative result does not preclude SARS-Cov-2 infection and should not be used as the sole basis for treatment or other patient management decisions. A negative result may occur with  improper specimen collection/handling,  submission of specimen other than nasopharyngeal swab, presence of viral mutation(s) within the areas targeted by this assay, and inadequate number of viral copies(<138 copies/mL). A negative result must be combined with clinical observations, patient history, and epidemiological information. The expected result is Negative.  Fact Sheet for Patients:  BloggerCourse.com  Fact Sheet for Healthcare Providers:  SeriousBroker.it  This test is no t yet approved or cleared by the [place]  FDA and  has been authorized for detection and/or diagnosis of SARS-CoV-2 by FDA under an Emergency Use Authorization (EUA). This EUA will remain  in effect (meaning this test can be used) for the duration of the COVID-19 declaration under Section 564(b)(1) of the Act, 21 U.S.C.section 360bbb-3(b)(1), unless the authorization is terminated  or revoked sooner.       Influenza A by PCR NEGATIVE NEGATIVE Final   Influenza B by PCR NEGATIVE NEGATIVE Final    Comment: (NOTE) The Xpert Xpress SARS-CoV-2/FLU/RSV plus assay is intended as an aid in the  diagnosis of influenza from Nasopharyngeal swab specimens and should not be used as a sole basis for treatment. Nasal washings and aspirates are unacceptable for Xpert Xpress SARS-CoV-2/FLU/RSV testing.  Fact Sheet for Patients: BloggerCourse.com  Fact Sheet for Healthcare Providers: SeriousBroker.it  This test is not yet approved or cleared by the [place]  FDA and has been authorized for detection and/or diagnosis of SARS-CoV-2 by FDA under an Emergency Use Authorization (EUA). This EUA will remain in effect (meaning this test can be used) for the duration of the COVID-19 declaration under Section 564(b)(1) of the Act, 21 U.S.C. section 360bbb-3(b)(1), unless the authorization is terminated or revoked.     Resp Syncytial Virus by PCR NEGATIVE NEGATIVE Final    Comment: (NOTE) Fact Sheet for Patients: BloggerCourse.com  Fact Sheet for Healthcare Providers: SeriousBroker.it  This test is not yet approved or cleared by the [place]  FDA and has been authorized for detection and/or diagnosis of SARS-CoV-2 by FDA under an Emergency Use Authorization (EUA). This EUA will remain in effect (meaning this test can be used) for the duration of the COVID-19 declaration under Section 564(b)(1) of the Act, 21 U.S.C. section 360bbb-3(b)(1), unless the authorization is terminated or revoked.  Performed at [facility], [address] [place], [place] [address]   Respiratory (~20 pathogens) panel by PCR     Status: None   Collection Time: 02/17/24  9:09 AM   Specimen: Nasopharyngeal Swab; Respiratory  Result Value Ref Range Status   Adenovirus NOT DETECTED NOT DETECTED Final   Coronavirus 229E NOT DETECTED NOT DETECTED Final    Comment: (NOTE) The Coronavirus on the Respiratory Panel, DOES NOT test for the novel  Coronavirus (2019 nCoV)    Coronavirus HKU1  NOT DETECTED NOT DETECTED Final   Coronavirus NL63 NOT DETECTED NOT DETECTED Final   Coronavirus OC43 NOT DETECTED NOT DETECTED Final   Metapneumovirus NOT DETECTED NOT DETECTED Final   Rhinovirus / Enterovirus NOT DETECTED NOT DETECTED Final   Influenza A NOT DETECTED NOT DETECTED Final   Influenza B NOT DETECTED NOT DETECTED Final   Parainfluenza Virus 1 NOT DETECTED NOT DETECTED Final   Parainfluenza Virus 2 NOT DETECTED NOT DETECTED Final   Parainfluenza Virus 3 NOT DETECTED NOT DETECTED Final   Parainfluenza Virus 4 NOT DETECTED NOT DETECTED Final   Respiratory Syncytial Virus NOT DETECTED NOT DETECTED Final   Bordetella pertussis NOT DETECTED NOT DETECTED Final   Bordetella Parapertussis NOT DETECTED NOT DETECTED Final  Chlamydophila pneumoniae NOT DETECTED NOT DETECTED Final   Mycoplasma pneumoniae NOT DETECTED NOT DETECTED Final    Comment: Performed at [facility], [address], [place], [place] [address]  Blood culture (routine x 2)     Status: None   Collection Time: 02/17/24  9:21 AM   Specimen: BLOOD LEFT ARM  Result Value Ref Range Status   Specimen Description   Final    BLOOD LEFT ARM Performed at [facility], [address], [place], [place] [address]    Special Requests   Final    BOTTLES DRAWN AEROBIC AND ANAEROBIC Blood Culture adequate volume Performed at [facility], [address] [place], [place] [address]    Culture   Final    NO GROWTH 5 DAYS Performed at [facility], [address], [place], [place] [address]    Report Status 02/22/2024 FINAL  Final  Blood culture (routine x 2)     Status: None   Collection Time: 02/17/24  9:59 AM   Specimen: BLOOD  Result Value Ref Range Status   Specimen Description   Final    BLOOD SITE NOT SPECIFIED Performed at [facility], [address], [place], [place] [address]    Special Requests   Final    BOTTLES DRAWN AEROBIC AND ANAEROBIC Blood Culture  adequate volume Performed at [facility], [address], [place], [place] [address]    Culture   Final    NO GROWTH 5 DAYS Performed at [facility], [address], [place], [place] [address]    Report Status 02/22/2024 FINAL  Final  MRSA Next Gen by PCR, Nasal     Status: None   Collection Time: 02/17/24  8:17 PM   Specimen: Nasal Mucosa; Nasal Swab  Result Value Ref Range Status   MRSA by PCR Next Gen NOT DETECTED NOT DETECTED Final    Comment: (NOTE) The GeneXpert MRSA Assay (FDA approved for NASAL specimens only), is one component of a comprehensive MRSA colonization surveillance program. It is not intended to diagnose MRSA infection nor to guide or monitor treatment for MRSA infections. Test performance is not FDA approved in patients less than 20 years old. Performed at [facility], [address] [place], [place] [address]     Radiology Studies: ECHOCARDIOGRAM COMPLETE Result Date: 02/21/2024    ECHOCARDIOGRAM REPORT   Patient Name:   Lauren Davidson Date of Exam: 02/21/2024 Medical Rec #:  [id number]      Height:       65.0 in Accession #:    [id number]     Weight:       129.4 lb Date of Birth:  10-24-1921      BSA:          1.644 m Patient Age:    89 years      BP:           147/60 mmHg Patient Gender: F              HR:           67 bpm. Exam Location:  Inpatient Procedure: 2D Echo, Cardiac Doppler and Color Doppler (Both Spectral and Color            Flow Doppler were utilized during procedure). Indications:    Dyspnea  History:        Patient has prior history of Echocardiogram examinations, most  recent 06/13/2022. Mitral Valve Prolapse and Mitral Valve                 Disease; Risk Factors:Hypertension and Dyslipidemia. CKD.  Sonographer:    Philomena Daring Referring Phys: [id number] PARDEEP KHATRI IMPRESSIONS  1. Left ventricular ejection fraction, by estimation, is 60 to 65%. The left ventricle has normal function. The  left ventricle has no regional wall motion abnormalities. Left ventricular diastolic parameters are consistent with Grade I diastolic dysfunction (impaired relaxation). Elevated left ventricular end-diastolic pressure.  2. Right ventricular systolic function is normal. The right ventricular size is normal. There is mildly elevated pulmonary artery systolic pressure. The estimated right ventricular systolic pressure is 37.3 mmHg.  3. The mitral valve is abnormal. Moderate mitral valve regurgitation, eccentric jet impinging on the wall/septum. Mild mitral stenosis. The mean mitral valve gradient is 5.8 mmHg. Severe mitral annular calcification.  4. The tricuspid valve is abnormal. Possible prolpase of anterior and posterior tricuspid valve leaflets. Tricuspid valve regurgitation is moderate, eccentric jet impinging on the interatrial septum.  5. The aortic valve is tricuspid. Aortic valve regurgitation is not visualized. Mild aortic valve stenosis. Aortic valve area, by VTI measures 1.40 cm. Aortic valve mean gradient measures 9.0 mmHg. Aortic valve Vmax measures 1.99 m/s.  6. The inferior vena cava is normal in size with greater than 50% respiratory variability, suggesting right atrial pressure of 3 mmHg. Comparison(s): No significant change from prior study. FINDINGS  Left Ventricle: Left ventricular ejection fraction, by estimation, is 60 to 65%. The left ventricle has normal function. The left ventricle has no regional wall motion abnormalities. Strain was performed and the global longitudinal strain is indeterminate. The left ventricular internal cavity size was normal in size. There is no left ventricular hypertrophy. Left ventricular diastolic parameters are consistent with Grade I diastolic dysfunction (impaired relaxation). Elevated left ventricular end-diastolic pressure. Right Ventricle: The right ventricular size is normal. No increase in right ventricular wall thickness. Right ventricular systolic  function is normal. There is mildly elevated pulmonary artery systolic pressure. The tricuspid regurgitant velocity is 2.93  m/s, and with an assumed right atrial pressure of 3 mmHg, the estimated right ventricular systolic pressure is 37.3 mmHg. Left Atrium: Left atrial size was normal in size. Right Atrium: Right atrial size was normal in size. Pericardium: There is no evidence of pericardial effusion. Mitral Valve: The mitral valve is abnormal. Severe mitral annular calcification. Moderate mitral valve regurgitation, with eccentric anteriorly directed jet. Mild mitral valve stenosis. The mean mitral valve gradient is 5.8 mmHg. Tricuspid Valve: The tricuspid valve is abnormal. Tricuspid valve regurgitation is moderate . No evidence of tricuspid stenosis. There is moderate holosystolic prolapse of the tricuspid anterior and posterior leaflets. Aortic Valve: The aortic valve is tricuspid. Aortic valve regurgitation is not visualized. Mild aortic stenosis is present. Aortic valve mean gradient measures 9.0 mmHg. Aortic valve peak gradient measures 15.8 mmHg. Aortic valve area, by VTI measures 1.40 cm. Pulmonic Valve: The pulmonic valve was normal in structure. Pulmonic valve regurgitation is trivial. No evidence of pulmonic stenosis. Aorta: The aortic root and ascending aorta are structurally normal, with no evidence of dilitation. Venous: The inferior vena cava is normal in size with greater than 50% respiratory variability, suggesting right atrial pressure of 3 mmHg. IAS/Shunts: No atrial level shunt detected by color flow Doppler. Additional Comments: 3D was performed not requiring image post processing on an independent workstation and was indeterminate.  LEFT VENTRICLE PLAX 2D LVIDd:  3.70 cm   Diastology LVIDs:         2.40 cm   LV e' medial:    4.57 cm/s LV PW:         1.00 cm   LV E/e' medial:  24.7 LV IVS:        1.00 cm   LV e' lateral:   5.33 cm/s LVOT diam:     1.70 cm   LV E/e' lateral: 21.2 LV  SV:         63 LV SV Index:   38 LVOT Area:     2.27 cm  RIGHT VENTRICLE             IVC RV S prime:     11.30 cm/s  IVC diam: 1.60 cm TAPSE (M-mode): 3.3 cm LEFT ATRIUM             Index        RIGHT ATRIUM           Index LA Vol (A2C):   45.2 ml 27.49 ml/m  RA Area:     14.20 cm LA Vol (A4C):   44.7 ml 27.19 ml/m  RA Volume:   29.60 ml  18.00 ml/m LA Biplane Vol: 45.9 ml 27.92 ml/m  AORTIC VALVE AV Area (Vmax):    1.42 cm AV Area (Vmean):   1.35 cm AV Area (VTI):     1.40 cm AV Vmax:           199.00 cm/s AV Vmean:          136.000 cm/s AV VTI:            0.447 m AV Peak Grad:      15.8 mmHg AV Mean Grad:      9.0 mmHg LVOT Vmax:         124.50 cm/s LVOT Vmean:        81.150 cm/s LVOT VTI:          0.276 m LVOT/AV VTI ratio: 0.62  AORTA Ao Root diam: 2.60 cm Ao Asc diam:  2.70 cm MITRAL VALVE                TRICUSPID VALVE MV Area (PHT): 2.42 cm     TR Peak grad:   34.3 mmHg MV Mean grad:  5.8 mmHg     TR Vmax:        293.00 cm/s MV Decel Time: 314 msec MR Peak grad: 106.5 mmHg    SHUNTS MR Mean grad: 67.0 mmHg     Systemic VTI:  0.28 m MR Vmax:      516.00 cm/s   Systemic Diam: 1.70 cm MR Vmean:     384.0 cm/s MV E velocity: 113.00 cm/s MV A velocity: 145.00 cm/s MV E/A ratio:  0.78 Vishnu Priya Mallipeddi Electronically signed by Diannah Late Mallipeddi Signature Date/Time: 02/21/2024/1:39:05 PM    Final     Scheduled Meds:  acetaminophen   650 mg Oral TID   enoxaparin  (LOVENOX ) injection  40 mg Subcutaneous Q24H   furosemide  20 mg Intravenous Daily   guaiFENesin  1,200 mg Oral BID   pantoprazole   40 mg Oral Daily   saline  1 Application Each Nare Q4H   Continuous Infusions:   LOS: 4 days    Time spent: 35 Mins    Elvan Sor, MD [facility]   If 7PM-7AM, please contact night-coverage

## 2024-02-22 NOTE — Plan of Care (Signed)
  Problem: Education: Goal: Knowledge of General Education information will improve Description: Including pain rating scale, medication(s)/side effects and non-pharmacologic comfort measures Outcome: Progressing   Problem: Health Behavior/Discharge Planning: Goal: Ability to manage health-related needs will improve Outcome: Progressing   Problem: Elimination: Goal: Will not experience complications related to bowel motility Outcome: Progressing Goal: Will not experience complications related to urinary retention Outcome: Progressing   Problem: Pain Managment: Goal: General experience of comfort will improve and/or be controlled Outcome: Progressing   Problem: Safety: Goal: Ability to remain free from injury will improve Outcome: Progressing   Problem: Skin Integrity: Goal: Risk for impaired skin integrity will decrease Outcome: Progressing

## 2024-02-23 DIAGNOSIS — R41841 Cognitive communication deficit: Secondary | ICD-10-CM | POA: Diagnosis not present

## 2024-02-23 DIAGNOSIS — F411 Generalized anxiety disorder: Secondary | ICD-10-CM | POA: Diagnosis not present

## 2024-02-23 DIAGNOSIS — R1310 Dysphagia, unspecified: Secondary | ICD-10-CM | POA: Diagnosis not present

## 2024-02-23 DIAGNOSIS — M810 Age-related osteoporosis without current pathological fracture: Secondary | ICD-10-CM | POA: Diagnosis not present

## 2024-02-23 DIAGNOSIS — R2689 Other abnormalities of gait and mobility: Secondary | ICD-10-CM | POA: Diagnosis not present

## 2024-02-23 DIAGNOSIS — S72142D Displaced intertrochanteric fracture of left femur, subsequent encounter for closed fracture with routine healing: Secondary | ICD-10-CM | POA: Diagnosis not present

## 2024-02-23 DIAGNOSIS — Z681 Body mass index (BMI) 19 or less, adult: Secondary | ICD-10-CM | POA: Diagnosis not present

## 2024-02-23 DIAGNOSIS — D649 Anemia, unspecified: Secondary | ICD-10-CM | POA: Diagnosis not present

## 2024-02-23 DIAGNOSIS — M48061 Spinal stenosis, lumbar region without neurogenic claudication: Secondary | ICD-10-CM | POA: Diagnosis not present

## 2024-02-23 DIAGNOSIS — Z23 Encounter for immunization: Secondary | ICD-10-CM | POA: Diagnosis present

## 2024-02-23 DIAGNOSIS — G8929 Other chronic pain: Secondary | ICD-10-CM | POA: Diagnosis not present

## 2024-02-23 DIAGNOSIS — I10 Essential (primary) hypertension: Secondary | ICD-10-CM | POA: Diagnosis not present

## 2024-02-23 DIAGNOSIS — E440 Moderate protein-calorie malnutrition: Secondary | ICD-10-CM | POA: Diagnosis not present

## 2024-02-23 DIAGNOSIS — Z743 Need for continuous supervision: Secondary | ICD-10-CM | POA: Diagnosis not present

## 2024-02-23 DIAGNOSIS — R296 Repeated falls: Secondary | ICD-10-CM | POA: Diagnosis not present

## 2024-02-23 DIAGNOSIS — M79674 Pain in right toe(s): Secondary | ICD-10-CM | POA: Diagnosis not present

## 2024-02-23 DIAGNOSIS — I509 Heart failure, unspecified: Secondary | ICD-10-CM | POA: Diagnosis not present

## 2024-02-23 DIAGNOSIS — I5032 Chronic diastolic (congestive) heart failure: Secondary | ICD-10-CM | POA: Diagnosis not present

## 2024-02-23 DIAGNOSIS — M3500 Sicca syndrome, unspecified: Secondary | ICD-10-CM | POA: Diagnosis not present

## 2024-02-23 DIAGNOSIS — K219 Gastro-esophageal reflux disease without esophagitis: Secondary | ICD-10-CM | POA: Diagnosis not present

## 2024-02-23 DIAGNOSIS — M6281 Muscle weakness (generalized): Secondary | ICD-10-CM | POA: Diagnosis not present

## 2024-02-23 DIAGNOSIS — R2681 Unsteadiness on feet: Secondary | ICD-10-CM | POA: Diagnosis not present

## 2024-02-23 DIAGNOSIS — N182 Chronic kidney disease, stage 2 (mild): Secondary | ICD-10-CM | POA: Diagnosis not present

## 2024-02-23 DIAGNOSIS — R531 Weakness: Secondary | ICD-10-CM | POA: Diagnosis not present

## 2024-02-23 DIAGNOSIS — R262 Difficulty in walking, not elsewhere classified: Secondary | ICD-10-CM | POA: Diagnosis not present

## 2024-02-23 DIAGNOSIS — H9190 Unspecified hearing loss, unspecified ear: Secondary | ICD-10-CM | POA: Diagnosis not present

## 2024-02-23 DIAGNOSIS — L259 Unspecified contact dermatitis, unspecified cause: Secondary | ICD-10-CM | POA: Diagnosis not present

## 2024-02-23 DIAGNOSIS — J9601 Acute respiratory failure with hypoxia: Secondary | ICD-10-CM | POA: Diagnosis not present

## 2024-02-23 DIAGNOSIS — Z7401 Bed confinement status: Secondary | ICD-10-CM | POA: Diagnosis not present

## 2024-02-23 DIAGNOSIS — R54 Age-related physical debility: Secondary | ICD-10-CM | POA: Diagnosis not present

## 2024-02-23 DIAGNOSIS — Z9181 History of falling: Secondary | ICD-10-CM | POA: Diagnosis not present

## 2024-02-23 DIAGNOSIS — E785 Hyperlipidemia, unspecified: Secondary | ICD-10-CM | POA: Diagnosis not present

## 2024-02-23 LAB — BASIC METABOLIC PANEL WITH GFR
Anion gap: 8 (ref 5–15)
BUN: 26 mg/dL — ABNORMAL HIGH (ref 8–23)
CO2: 29 mmol/L (ref 22–32)
Calcium: 9.3 mg/dL (ref 8.9–10.3)
Chloride: 94 mmol/L — ABNORMAL LOW (ref 98–111)
Creatinine, Ser: 0.66 mg/dL (ref 0.44–1.00)
GFR, Estimated: >60 mL/min (ref >60)
Glucose, Bld: 85 mg/dL (ref 70–99)
Potassium: 4.1 mmol/L (ref 3.5–5.1)
Sodium: 131 mmol/L — ABNORMAL LOW (ref 135–145)

## 2024-02-23 LAB — MAGNESIUM: Magnesium: 2.4 mg/dL (ref 1.7–2.4)

## 2024-02-23 LAB — CBC
HCT: 37.2 % (ref 36.0–46.0)
Hemoglobin: 12.3 g/dL (ref 12.0–15.0)
MCH: 32.6 pg (ref 26.0–34.0)
MCHC: 33.1 g/dL (ref 30.0–36.0)
MCV: 98.7 fL (ref 80.0–100.0)
Platelets: 194 K/uL (ref 150–400)
RBC: 3.77 MIL/uL — ABNORMAL LOW (ref 3.87–5.11)
RDW: 13.3 % (ref 11.5–15.5)
WBC: 3.4 K/uL — ABNORMAL LOW (ref 4.0–10.5)
nRBC: 0 % (ref 0.0–0.2)

## 2024-02-23 LAB — PHOSPHORUS: Phosphorus: 3.4 mg/dL (ref 2.5–4.6)

## 2024-02-23 MED ORDER — OXYCODONE HCL 5 MG PO TABS: 2.5000 mg | ORAL_TABLET | ORAL | 0 refills | Status: DC | PRN

## 2024-02-23 MED ORDER — FUROSEMIDE 20 MG PO TABS
20.0000 mg | ORAL_TABLET | Freq: Every day | ORAL | Status: DC
  Administered 2024-02-23: 20 mg via ORAL
  Filled 2024-02-23: qty 1

## 2024-02-23 MED ORDER — FUROSEMIDE 20 MG PO TABS: 20.0000 mg | ORAL_TABLET | Freq: Every day | ORAL | 1 refills | Status: AC

## 2024-02-23 MED ORDER — GUAIFENESIN ER 600 MG PO TB12: 1200.0000 mg | ORAL_TABLET | Freq: Two times a day (BID) | ORAL | 0 refills | Status: DC

## 2024-02-23 NOTE — Plan of Care (Signed)

## 2024-02-23 NOTE — Progress Notes (Signed)
   02/23/24 1416  SNF Authorization Status  SNF AUTH USER ROLE CMRN  Date SNF Auth Started 02/20/24  Date SNF Authorization Complete 89/93/74 (Cert. # [id number])  SNF Insurance Authorization Status Approved

## 2024-02-23 NOTE — Progress Notes (Signed)
 The patient has met all goals per MD orders and is ready for discharge. Patient in agreement, being discharged to [facility] under care of physician. Discharge summary paperwork placed in packet. Patient left with all belongings in stable conditions via [facility]. Report called to Nurse [facility] LPN.

## 2024-02-23 NOTE — Progress Notes (Signed)
 I attest to student documentation.  Cherye Gaertner, MSN-RN Nursing Faculty/Clinical Instructor [facility]

## 2024-02-23 NOTE — Plan of Care (Signed)
  Problem: Education: Goal: Knowledge of General Education information will improve Description: Including pain rating scale, medication(s)/side effects and non-pharmacologic comfort measures Outcome: Progressing   Problem: Clinical Measurements: Goal: Will remain free from infection Outcome: Progressing Goal: Diagnostic test results will improve Outcome: Progressing Goal: Respiratory complications will improve Outcome: Progressing Goal: Cardiovascular complication will be avoided Outcome: Progressing   Problem: Safety: Goal: Ability to remain free from injury will improve Outcome: Progressing   Problem: Skin Integrity: Goal: Risk for impaired skin integrity will decrease Outcome: Progressing

## 2024-02-23 NOTE — Plan of Care (Signed)
  Problem: Education: Goal: Knowledge of General Education information will improve Description: Including pain rating scale, medication(s)/side effects and non-pharmacologic comfort measures 02/23/2024 1528 by Morna Aquas, RN Outcome: Adequate for Discharge 02/23/2024 1516 by Morna Aquas, RN Outcome: Adequate for Discharge 02/23/2024 0746 by Morna Aquas, RN Outcome: Progressing   Problem: Health Behavior/Discharge Planning: Goal: Ability to manage health-related needs will improve 02/23/2024 1528 by Morna Aquas, RN Outcome: Adequate for Discharge 02/23/2024 1516 by Morna Aquas, RN Outcome: Adequate for Discharge   Problem: Clinical Measurements: Goal: Ability to maintain clinical measurements within normal limits will improve 02/23/2024 1528 by Morna Aquas, RN Outcome: Adequate for Discharge 02/23/2024 1516 by Morna Aquas, RN Outcome: Adequate for Discharge Goal: Will remain free from infection 02/23/2024 1528 by Morna Aquas, RN Outcome: Adequate for Discharge 02/23/2024 1516 by Morna Aquas, RN Outcome: Adequate for Discharge 02/23/2024 0746 by Morna Aquas, RN Outcome: Progressing Goal: Diagnostic test results will improve 02/23/2024 1528 by Morna Aquas, RN Outcome: Adequate for Discharge 02/23/2024 1516 by Morna Aquas, RN Outcome: Adequate for Discharge 02/23/2024 0746 by Morna Aquas, RN Outcome: Progressing Goal: Respiratory complications will improve 02/23/2024 1528 by Morna Aquas, RN Outcome: Adequate for Discharge 02/23/2024 1516 by Morna Aquas, RN Outcome: Adequate for Discharge 02/23/2024 0746 by Morna Aquas, RN Outcome: Progressing Goal: Cardiovascular complication will be avoided 02/23/2024 1528 by Morna Aquas, RN Outcome: Adequate for Discharge 02/23/2024 1516 by Morna Aquas, RN Outcome: Adequate for Discharge 02/23/2024 0746 by Morna Aquas, RN Outcome: Progressing   Problem: Activity: Goal: Risk  for activity intolerance will decrease 02/23/2024 1528 by Morna Aquas, RN Outcome: Adequate for Discharge 02/23/2024 1516 by Morna Aquas, RN Outcome: Adequate for Discharge   Problem: Nutrition: Goal: Adequate nutrition will be maintained 02/23/2024 1528 by Morna Aquas, RN Outcome: Adequate for Discharge 02/23/2024 1516 by Morna Aquas, RN Outcome: Adequate for Discharge   Problem: Coping: Goal: Level of anxiety will decrease 02/23/2024 1528 by Morna Aquas, RN Outcome: Adequate for Discharge 02/23/2024 1516 by Morna Aquas, RN Outcome: Adequate for Discharge   Problem: Elimination: Goal: Will not experience complications related to bowel motility 02/23/2024 1528 by Morna Aquas, RN Outcome: Adequate for Discharge 02/23/2024 1516 by Morna Aquas, RN Outcome: Adequate for Discharge Goal: Will not experience complications related to urinary retention 02/23/2024 1528 by Morna Aquas, RN Outcome: Adequate for Discharge 02/23/2024 1516 by Morna Aquas, RN Outcome: Adequate for Discharge   Problem: Pain Managment: Goal: General experience of comfort will improve and/or be controlled 02/23/2024 1528 by Morna Aquas, RN Outcome: Adequate for Discharge 02/23/2024 1516 by Morna Aquas, RN Outcome: Adequate for Discharge   Problem: Safety: Goal: Ability to remain free from injury will improve 02/23/2024 1528 by Morna Aquas, RN Outcome: Adequate for Discharge 02/23/2024 1516 by Morna Aquas, RN Outcome: Adequate for Discharge 02/23/2024 0746 by Morna Aquas, RN Outcome: Progressing   Problem: Skin Integrity: Goal: Risk for impaired skin integrity will decrease 02/23/2024 1528 by Morna Aquas, RN Outcome: Adequate for Discharge 02/23/2024 1516 by Morna Aquas, RN Outcome: Adequate for Discharge 02/23/2024 0746 by Morna Aquas, RN Outcome: Progressing

## 2024-02-23 NOTE — Plan of Care (Signed)
  Problem: Education: Goal: Knowledge of General Education information will improve Description: Including pain rating scale, medication(s)/side effects and non-pharmacologic comfort measures 02/23/2024 1516 by Morna Aquas, RN Outcome: Adequate for Discharge 02/23/2024 0746 by Morna Aquas, RN Outcome: Progressing   Problem: Health Behavior/Discharge Planning: Goal: Ability to manage health-related needs will improve Outcome: Adequate for Discharge   Problem: Clinical Measurements: Goal: Ability to maintain clinical measurements within normal limits will improve Outcome: Adequate for Discharge Goal: Will remain free from infection 02/23/2024 1516 by Morna Aquas, RN Outcome: Adequate for Discharge 02/23/2024 0746 by Morna Aquas, RN Outcome: Progressing Goal: Diagnostic test results will improve 02/23/2024 1516 by Morna Aquas, RN Outcome: Adequate for Discharge 02/23/2024 0746 by Morna Aquas, RN Outcome: Progressing Goal: Respiratory complications will improve 02/23/2024 1516 by Morna Aquas, RN Outcome: Adequate for Discharge 02/23/2024 0746 by Morna Aquas, RN Outcome: Progressing Goal: Cardiovascular complication will be avoided 02/23/2024 1516 by Morna Aquas, RN Outcome: Adequate for Discharge 02/23/2024 0746 by Morna Aquas, RN Outcome: Progressing   Problem: Activity: Goal: Risk for activity intolerance will decrease Outcome: Adequate for Discharge   Problem: Nutrition: Goal: Adequate nutrition will be maintained Outcome: Adequate for Discharge   Problem: Coping: Goal: Level of anxiety will decrease Outcome: Adequate for Discharge   Problem: Elimination: Goal: Will not experience complications related to bowel motility Outcome: Adequate for Discharge Goal: Will not experience complications related to urinary retention Outcome: Adequate for Discharge   Problem: Pain Managment: Goal: General experience of comfort will improve and/or  be controlled Outcome: Adequate for Discharge   Problem: Safety: Goal: Ability to remain free from injury will improve 02/23/2024 1516 by Morna Aquas, RN Outcome: Adequate for Discharge 02/23/2024 0746 by Morna Aquas, RN Outcome: Progressing   Problem: Skin Integrity: Goal: Risk for impaired skin integrity will decrease 02/23/2024 1516 by Morna Aquas, RN Outcome: Adequate for Discharge 02/23/2024 0746 by Morna Aquas, RN Outcome: Progressing

## 2024-02-23 NOTE — Progress Notes (Signed)
 Physical Therapy Treatment Patient Details Name: Lauren Davidson MRN: [id number] DOB: 1921-06-10 Today's Date: 02/23/2024   History of Present Illness Pt is a 89 y.o. female admitted on 02/17/24 with acute hypoxic respiratory failure likely due to acute heart failure with preserved EF.  PMH significant for heart failure with preserved EF, lumbar compression fractures, hyperlipidemia, and L IM nail.    PT Comments  Pt making gradual progress but remains weak and needing min-mod A.  Motivated and tolerated therapy well. Continue to recommend Patient will benefit from continued inpatient follow up therapy, <3 hours/day at d/c.    If plan is discharge home, recommend the following: A lot of help with bathing/dressing/bathroom;A lot of help with walking and/or transfers;Help with stairs or ramp for entrance;Assist for transportation;Assistance with cooking/housework   Can travel by private vehicle     Yes  Equipment Recommendations  None recommended by PT    Recommendations for Other Services       Precautions / Restrictions Precautions Precautions: Fall     Mobility  Bed Mobility Overal bed mobility: Needs Assistance Bed Mobility: Sit to Supine       Sit to supine: Min assist        Transfers Overall transfer level: Needs assistance Equipment used: Rolling walker (2 wheels) Transfers: Sit to/from Stand Sit to Stand: Mod assist           General transfer comment: STS x 3 with cues for hand placement    Ambulation/Gait Ambulation/Gait assistance: Mod assist, +2 safety/equipment Gait Distance (Feet): 10 Feet (10' then 5') Assistive device: Rolling walker (2 wheels) Gait Pattern/deviations: Step-to pattern, Trunk flexed Gait velocity: decreased     General Gait Details: Min cues for RW use and posture, Had +2 for safety/chair follow but pt only needing min to mod A, did fatigue easily   Stairs             Wheelchair Mobility     Tilt Bed    Modified  Rankin (Stroke Patients Only)       Balance Overall balance assessment: Needs assistance Sitting-balance support: No upper extremity supported, Feet supported Sitting balance-Leahy Scale: Fair Sitting balance - Comments: Can sit at edge of chair but fatigueds   Standing balance support: Bilateral upper extremity supported, During functional activity Standing balance-Leahy Scale: Poor Standing balance comment: RW and min-mod support                            Communication Communication Factors Affecting Communication: Hearing impaired  Cognition Arousal: Alert Behavior During Therapy: WFL for tasks assessed/performed   PT - Cognitive impairments: No apparent impairments                       PT - Cognition Comments: AxO x 4 sharp lady for her age.  Retired Engineer, civil (consulting)..  Following all instructions and willing.  Resides at ALF past 1.5 years and prior to that was at [facility].        Cueing    Exercises General Exercises - Lower Extremity Ankle Circles/Pumps: AROM, Both, 10 reps, Supine Quad Sets: AROM, Both, 10 reps, Supine Gluteal Sets: AROM, Both, 10 reps, Supine Long Arc Quad: AROM, Both, 10 reps, Seated Heel Slides: AROM, 10 reps, Both, Supine Hip ABduction/ADduction: AROM, Supine, Both, 10 reps    General Comments General comments (skin integrity, edema, etc.): VSS on RA - sats 96%  Pertinent Vitals/Pain Pain Assessment Pain Assessment: Faces Faces Pain Scale: Hurts a little bit Pain Location: Back and L hip with activity Pain Descriptors / Indicators: Discomfort Pain Intervention(s): Limited activity within patient's tolerance, Monitored during session, Premedicated before session, Repositioned    Home Living                          Prior Function            PT Goals (current goals can now be found in the care plan section) Progress towards PT goals: Progressing toward goals    Frequency    Min  2X/week      PT Plan      Co-evaluation              AM-PAC PT 6 Clicks Mobility   Outcome Measure  Help needed turning from your back to your side while in a flat bed without using bedrails?: A Little Help needed moving from lying on your back to sitting on the side of a flat bed without using bedrails?: A Lot Help needed moving to and from a bed to a chair (including a wheelchair)?: A Little Help needed standing up from a chair using your arms (e.g., wheelchair or bedside chair)?: A Little Help needed to walk in hospital room?: A Lot Help needed climbing 3-5 steps with a railing? : Total 6 Click Score: 14    End of Session Equipment Utilized During Treatment: Gait belt Activity Tolerance: Patient tolerated treatment well Patient left: in bed;with call bell/phone within reach;with bed alarm set Nurse Communication: Mobility status PT Visit Diagnosis: Muscle weakness (generalized) (M62.81);Difficulty in walking, not elsewhere classified (R26.2);Repeated falls (R29.6);Pain     Time: 8768-8745 PT Time Calculation (min) (ACUTE ONLY): 23 min  Charges:    $Gait Training: 8-22 mins $Therapeutic Exercise: 8-22 mins PT General Charges $$ ACUTE PT VISIT: 1 Visit                     Benjiman, PT Acute Rehab [facility] [phone]    Benjiman VEAR Mulberry 02/23/2024, 1:54 PM

## 2024-02-23 NOTE — TOC Transition Note (Signed)
 Transition of Care [facility]) - Discharge Note   Patient Details  Name: Lauren Davidson MRN: [id number] Date of Birth: 03/10/1922  Transition of Care [facility]) CM/SW Contact:  Doneta Glenys ONEIDA, RN Phone Number: 02/23/2024, 1:02 PM   Clinical Narrative:    Patient will DC un:Yzjmuojwi Anticipated DC date:10/6 Family notified:Harold Dunn Transport ab:[id number]   Per MD patient ready for DC to [facility] . RN to call report prior to discharge [phone] Rm [phone]). RN, patient, patient's family, and facility notified of DC. Discharge Summary and FL2 sent to facility via HUB. DC packet on chart includes face sheet, medical necessity, signed DNR. Ambulance PT[facility] transport requested for patient.   CM will sign off. Please consult us  again if new needs arise.     Final next level of care: Skilled Nursing Facility Barriers to Discharge: Barriers Resolved   Patient Goals and CMS Choice Patient states their goals for this hospitalization and ongoing recovery are:: To return to [facility]          Discharge Placement   Existing PASRR number confirmed : 02/23/24            Patient to be transferred to facility by: PTAR Name of family member notified: Helayne Grand Patient and family notified of of transfer: 02/23/24  Discharge Plan and Services Additional resources added to the After Visit Summary for   In-house Referral: Clinical Social Work Discharge Planning Services: NA Post Acute Care Choice: NA          DME Arranged: N/A DME Agency: NA       HH Arranged: NA HH Agency: NA        Social Drivers of Health (SDOH) Interventions SDOH Screenings   Food Insecurity: No Food Insecurity (02/17/2024)  Housing: Low Risk  (02/17/2024)  Transportation Needs: No Transportation Needs (02/17/2024)  Utilities: Not At Risk (02/17/2024)  Social Connections: Moderately Integrated (02/17/2024)  Tobacco Use: Low Risk  (02/17/2024)     Readmission Risk Interventions     No data to display

## 2024-02-23 NOTE — Discharge Summary (Signed)
 Physician Discharge Summary   Patient: DALEISA HALPERIN MRN: [id number] DOB: 07/30/1921  Admit date:     02/17/2024  Discharge date: 02/23/24  Discharge Physician: Owen DELENA Lore   PCP: Dwight Trula SQUIBB, MD   Recommendations at discharge:    Needs palliative care follow up at Rehab.  Needs to follow fluid restriction and low sodium diet.    Discharge Diagnoses: Principal Problem:   Acute CHF (congestive heart failure) (HCC)  Resolved Problems:   * No resolved hospital problems. *  Hospital Course: 89 year old with past medical history significant for heart failure ejection fraction preserved, lumbar compression fracture, hyperlipidemia presented with acute hypoxic respiratory failure due to acute heart failure exacerbation with preserved ejection fraction. Has developed worsening shortness of breath over the last few days, she has been also having worsening back pain. Patient also developed worsening lower extremity edema.   Assessment and Plan: Acute on chronic diastolic heart failure : Mild to moderate mitral valve regurgitation, Moderate tricuspid valve regurgitation: Patient presented with elevated BNP, pulmonary interstitial edema, lower extremity edema. Treated with  IV Lasix 20 mg I Transition to oral lasix.  Monitor electrolytes.  Negative 1.2 L Stable for rehab.   Acute hypoxic respiratory failure in the setting of heart failure exacerbation; -CTA chest: Pulmonary findings compatible with airway infection /inflammation and air trapping. Small-to-moderate bilateral pleural effusions with lower-lobe compressive atelectasis. --Treated  with DuoNeb and completed azithromycin for bronchitis.  Treated  with IV Lasix, off oxygen. Transition to oral lasix.  Continue Guaifenesin.   -off oxygen this morning.   Hypertension: Hold Norvasc  to avoid hypotension.    Chronic back pain Continue as needed oxycodone    Elevated alkaline phosphatase GGT not significantly  elevated. Follow as an outpatient   Generalized weakness:  Suspect related to acute illness. Magnesium and phosphorus. Normal.  -B12: 622   Elevated D dimer: CTA chest negative for PE>  Sjogren's syndrome:  Continue biotene mouth wash.    Estimated body mass index is 21.61 kg/m as calculated from the following:   Height as of this encounter: 5' 5 (1.651 m).   Weight as of this encounter: 58.9 kg.          Consultants: Palliative care Procedures performed: ECHO Disposition: Skilled nursing facility Diet recommendation:  Discharge Diet Orders (From admission, onward)     Start     Ordered   02/23/24 0000  Diet - low sodium heart healthy        02/23/24 1102           Cardiac diet DISCHARGE MEDICATION: Allergies as of 02/23/2024       Reactions   Statins Other (See Comments)   Muscle weakness and pain   Buprenorphine Hcl Other (See Comments)   Patient questioned this entry   Buspirone Other (See Comments)   Weakness   Lexapro  [escitalopram ] Other (See Comments)   Muscle problems   Meperidine Other (See Comments)   Demerol- questionable allergy- reaction not recalled   Morphine And Codeine Rash        Medication List     STOP taking these medications    amLODipine  5 MG tablet Commonly known as: NORVASC    ibuprofen  200 MG tablet Commonly known as: ADVIL    oxycodone  5 MG capsule Commonly known as: OXY-IR Replaced by: oxyCODONE  5 MG immediate release tablet       TAKE these medications    acetaminophen  500 MG tablet Commonly known as: TYLENOL  Take 500 mg by mouth every  6 (six) hours as needed (for pain).   CALCIUM +D3 PO Take 1 tablet by mouth daily.   docusate sodium  100 MG capsule Commonly known as: COLACE Take 1 capsule (100 mg total) by mouth 2 (two) times daily.   ferrous gluconate  324 MG tablet Commonly known as: FERGON Take 324 mg by mouth daily with breakfast.   furosemide 20 MG tablet Commonly known as: LASIX Take 1  tablet (20 mg total) by mouth daily.   GenTeal Tears 0.1-0.3 % Soln Generic drug: Dextran 70-Hypromellose Place 1 drop into both eyes every 12 (twelve) hours as needed (dry eyes).   guaiFENesin 600 MG 12 hr tablet Commonly known as: MUCINEX Take 2 tablets (1,200 mg total) by mouth 2 (two) times daily.   lidocaine  4 % Place 1 patch onto the skin daily.   oxyCODONE  5 MG immediate release tablet Commonly known as: Oxy IR/ROXICODONE  Take 0.5 tablets (2.5 mg total) by mouth every 4 (four) hours as needed for moderate pain (pain score 4-6). Replaces: oxycodone  5 MG capsule   pantoprazole  40 MG tablet Commonly known as: PROTONIX  Take 40 mg by mouth every morning.   PreserVision AREDS 2 Caps Take 1 capsule by mouth in the morning and at bedtime.        Contact information for follow-up providers     Dwight Trula SQUIBB, MD Follow up in 1 week(s).   Specialty: Internal Medicine Contact information: [address] [place] [address] [phone]              Contact information for after-discharge care     Destination     [facility], [place] .   Service: Skilled Nursing Contact information: [address] [place]  [phone]                    Discharge Exam: Fredricka Weights   02/22/24 0500 02/23/24 0512 02/23/24 0859  Weight: 56.6 kg 57.9 kg 57.9 kg   General; NAD  Condition at discharge: stable  The results of significant diagnostics from this hospitalization (including imaging, microbiology, ancillary and laboratory) are listed below for reference.   Imaging Studies: ECHOCARDIOGRAM COMPLETE Result Date: 02/21/2024    ECHOCARDIOGRAM REPORT   Patient Name:   GRACLYNN VANANTWERP Date of Exam: 02/21/2024 Medical Rec #:  [id number]      Height:       65.0 in Accession #:    [id number]     Weight:       129.4 lb Date of Birth:  1921-07-26      BSA:          1.644 m Patient Age:    89 years      BP:            147/60 mmHg Patient Gender: F              HR:           67 bpm. Exam Location:  Inpatient Procedure: 2D Echo, Cardiac Doppler and Color Doppler (Both Spectral and Color            Flow Doppler were utilized during procedure). Indications:    Dyspnea  History:        Patient has prior history of Echocardiogram examinations, most                 recent 06/13/2022. Mitral Valve Prolapse and Mitral Valve  Disease; Risk Factors:Hypertension and Dyslipidemia. CKD.  Sonographer:    Philomena Daring Referring Phys: [id number] PARDEEP KHATRI IMPRESSIONS  1. Left ventricular ejection fraction, by estimation, is 60 to 65%. The left ventricle has normal function. The left ventricle has no regional wall motion abnormalities. Left ventricular diastolic parameters are consistent with Grade I diastolic dysfunction (impaired relaxation). Elevated left ventricular end-diastolic pressure.  2. Right ventricular systolic function is normal. The right ventricular size is normal. There is mildly elevated pulmonary artery systolic pressure. The estimated right ventricular systolic pressure is 37.3 mmHg.  3. The mitral valve is abnormal. Moderate mitral valve regurgitation, eccentric jet impinging on the wall/septum. Mild mitral stenosis. The mean mitral valve gradient is 5.8 mmHg. Severe mitral annular calcification.  4. The tricuspid valve is abnormal. Possible prolpase of anterior and posterior tricuspid valve leaflets. Tricuspid valve regurgitation is moderate, eccentric jet impinging on the interatrial septum.  5. The aortic valve is tricuspid. Aortic valve regurgitation is not visualized. Mild aortic valve stenosis. Aortic valve area, by VTI measures 1.40 cm. Aortic valve mean gradient measures 9.0 mmHg. Aortic valve Vmax measures 1.99 m/s.  6. The inferior vena cava is normal in size with greater than 50% respiratory variability, suggesting right atrial pressure of 3 mmHg. Comparison(s): No significant change from prior  study. FINDINGS  Left Ventricle: Left ventricular ejection fraction, by estimation, is 60 to 65%. The left ventricle has normal function. The left ventricle has no regional wall motion abnormalities. Strain was performed and the global longitudinal strain is indeterminate. The left ventricular internal cavity size was normal in size. There is no left ventricular hypertrophy. Left ventricular diastolic parameters are consistent with Grade I diastolic dysfunction (impaired relaxation). Elevated left ventricular end-diastolic pressure. Right Ventricle: The right ventricular size is normal. No increase in right ventricular wall thickness. Right ventricular systolic function is normal. There is mildly elevated pulmonary artery systolic pressure. The tricuspid regurgitant velocity is 2.93  m/s, and with an assumed right atrial pressure of 3 mmHg, the estimated right ventricular systolic pressure is 37.3 mmHg. Left Atrium: Left atrial size was normal in size. Right Atrium: Right atrial size was normal in size. Pericardium: There is no evidence of pericardial effusion. Mitral Valve: The mitral valve is abnormal. Severe mitral annular calcification. Moderate mitral valve regurgitation, with eccentric anteriorly directed jet. Mild mitral valve stenosis. The mean mitral valve gradient is 5.8 mmHg. Tricuspid Valve: The tricuspid valve is abnormal. Tricuspid valve regurgitation is moderate . No evidence of tricuspid stenosis. There is moderate holosystolic prolapse of the tricuspid anterior and posterior leaflets. Aortic Valve: The aortic valve is tricuspid. Aortic valve regurgitation is not visualized. Mild aortic stenosis is present. Aortic valve mean gradient measures 9.0 mmHg. Aortic valve peak gradient measures 15.8 mmHg. Aortic valve area, by VTI measures 1.40 cm. Pulmonic Valve: The pulmonic valve was normal in structure. Pulmonic valve regurgitation is trivial. No evidence of pulmonic stenosis. Aorta: The aortic root  and ascending aorta are structurally normal, with no evidence of dilitation. Venous: The inferior vena cava is normal in size with greater than 50% respiratory variability, suggesting right atrial pressure of 3 mmHg. IAS/Shunts: No atrial level shunt detected by color flow Doppler. Additional Comments: 3D was performed not requiring image post processing on an independent workstation and was indeterminate.  LEFT VENTRICLE PLAX 2D LVIDd:         3.70 cm   Diastology LVIDs:         2.40 cm   LV e' medial:  4.57 cm/s LV PW:         1.00 cm   LV E/e' medial:  24.7 LV IVS:        1.00 cm   LV e' lateral:   5.33 cm/s LVOT diam:     1.70 cm   LV E/e' lateral: 21.2 LV SV:         63 LV SV Index:   38 LVOT Area:     2.27 cm  RIGHT VENTRICLE             IVC RV S prime:     11.30 cm/s  IVC diam: 1.60 cm TAPSE (M-mode): 3.3 cm LEFT ATRIUM             Index        RIGHT ATRIUM           Index LA Vol (A2C):   45.2 ml 27.49 ml/m  RA Area:     14.20 cm LA Vol (A4C):   44.7 ml 27.19 ml/m  RA Volume:   29.60 ml  18.00 ml/m LA Biplane Vol: 45.9 ml 27.92 ml/m  AORTIC VALVE AV Area (Vmax):    1.42 cm AV Area (Vmean):   1.35 cm AV Area (VTI):     1.40 cm AV Vmax:           199.00 cm/s AV Vmean:          136.000 cm/s AV VTI:            0.447 m AV Peak Grad:      15.8 mmHg AV Mean Grad:      9.0 mmHg LVOT Vmax:         124.50 cm/s LVOT Vmean:        81.150 cm/s LVOT VTI:          0.276 m LVOT/AV VTI ratio: 0.62  AORTA Ao Root diam: 2.60 cm Ao Asc diam:  2.70 cm MITRAL VALVE                TRICUSPID VALVE MV Area (PHT): 2.42 cm     TR Peak grad:   34.3 mmHg MV Mean grad:  5.8 mmHg     TR Vmax:        293.00 cm/s MV Decel Time: 314 msec MR Peak grad: 106.5 mmHg    SHUNTS MR Mean grad: 67.0 mmHg     Systemic VTI:  0.28 m MR Vmax:      516.00 cm/s   Systemic Diam: 1.70 cm MR Vmean:     384.0 cm/s MV E velocity: 113.00 cm/s MV A velocity: 145.00 cm/s MV E/A ratio:  0.78 Vishnu Priya Mallipeddi Electronically signed by Diannah Late  Mallipeddi Signature Date/Time: 02/21/2024/1:39:05 PM    Final    CT Angio Chest Pulmonary Embolism (PE) W or WO Contrast Result Date: 02/17/2024 EXAM: CTA CHEST 02/17/2024 07:21:19 PM TECHNIQUE: CTA of the chest was performed without and with the administration of 75 mL of iohexol  (OMNIPAQUE ) 350 MG/ML injection. Multiplanar reformatted images are provided for review. MIP images are provided for review. Automated exposure control, iterative reconstruction, and/or weight based adjustment of the mA/kV was utilized to reduce the radiation dose to as low as reasonably achievable. COMPARISON: Chest x-ray and CT chest 10/16/2023 negative for pulmonary embolism. CLINICAL HISTORY: Pulmonary embolism (PE) suspected, high prob. PE suspected. Shortness of breath. FINDINGS: PULMONARY ARTERIES: Pulmonary arteries are adequately opacified for evaluation. No acute pulmonary embolus. Main pulmonary artery is normal in  caliber. MEDIASTINUM: Cardiomegaly. Aortic and coronary artery atherosclerotic calcifications. No pericardial effusion. There is no acute abnormality of the thoracic aorta. LYMPH NODES: No mediastinal, hilar or axillary lymphadenopathy. LUNGS AND PLEURA: Bronchial wall thickening with mucus plugging in the lower lobes. Small-to-moderate bilateral pleural effusions and compressive atelectasis in the lower lobes. No zte attenuation of the lungs compatible with air trapping and small airway infection/inflammation. No focal consolidation or pulmonary edema. No pneumothorax. UPPER ABDOMEN: Limited images of the upper abdomen are unremarkable. SOFT TISSUES AND BONES: Numerous vertebral body compression fractures in the thoracic and upper lumbar spine are unchanged from prior. Vertebroplasty of T12, L1, and L2. Remote sternal fracture. No acute soft tissue abnormality. IMPRESSION: 1. No pulmonary embolism. 2. Pulmonary findings compatible with airway infection/inflammation and air trapping. 3. Small-to-moderate bilateral  pleural effusions with lower-lobe compressive atelectasis. Electronically signed by: Norman Gatlin MD 02/17/2024 07:43 PM EDT RP Workstation: [id number]   DG Chest Portable 1 View Result Date: 02/17/2024 CLINICAL DATA:  One 89 year old female with shortness of breath. EXAM: PORTABLE CHEST 1 VIEW COMPARISON:  Chest CT 10/16/2023 and earlier. FINDINGS: Portable AP semi upright view at 0926 hours. Stable cardiomegaly since May. Calcified aortic atherosclerosis. Other mediastinal contours are within normal limits. Visualized tracheal air column is within normal limits. Stable lung volumes. Small pleural effusions at that time likely persist. Increased generalized pulmonary vascular congestion from previous exams. No pneumothorax. No consolidation. Stable visualized osseous structures. Multilevel previous spinal compression fractures and augmentation. Paucity of bowel gas the visible abdomen. IMPRESSION: 1. Chronic cardiomegaly with suspected acute pulmonary interstitial edema. Viral/atypical respiratory infection felt less likely. Small chronic pleural effusions suspected. 2. Chronic compression fractures and augmentation. Electronically Signed   By: VEAR Hurst M.D.   On: 02/17/2024 09:49    Microbiology: Results for orders placed or performed during the hospital encounter of 02/17/24  Resp panel by RT-PCR (RSV, Flu A&B, Covid) Anterior Nasal Swab     Status: None   Collection Time: 02/17/24  9:09 AM   Specimen: Anterior Nasal Swab  Result Value Ref Range Status   SARS Coronavirus 2 by RT PCR NEGATIVE NEGATIVE Final    Comment: (NOTE) SARS-CoV-2 target nucleic acids are NOT DETECTED.  The SARS-CoV-2 RNA is generally detectable in upper respiratory specimens during the acute phase of infection. The lowest concentration of SARS-CoV-2 viral copies this assay can detect is 138 copies/mL. A negative result does not preclude SARS-Cov-2 infection and should not be used as the sole basis for treatment  or other patient management decisions. A negative result may occur with  improper specimen collection/handling, submission of specimen other than nasopharyngeal swab, presence of viral mutation(s) within the areas targeted by this assay, and inadequate number of viral copies(<138 copies/mL). A negative result must be combined with clinical observations, patient history, and epidemiological information. The expected result is Negative.  Fact Sheet for Patients:  BloggerCourse.com  Fact Sheet for Healthcare Providers:  SeriousBroker.it  This test is no t yet approved or cleared by the [place]  FDA and  has been authorized for detection and/or diagnosis of SARS-CoV-2 by FDA under an Emergency Use Authorization (EUA). This EUA will remain  in effect (meaning this test can be used) for the duration of the COVID-19 declaration under Section 564(b)(1) of the Act, 21 U.S.C.section 360bbb-3(b)(1), unless the authorization is terminated  or revoked sooner.       Influenza A by PCR NEGATIVE NEGATIVE Final   Influenza B by PCR NEGATIVE NEGATIVE Final  Comment: (NOTE) The Xpert Xpress SARS-CoV-2/FLU/RSV plus assay is intended as an aid in the diagnosis of influenza from Nasopharyngeal swab specimens and should not be used as a sole basis for treatment. Nasal washings and aspirates are unacceptable for Xpert Xpress SARS-CoV-2/FLU/RSV testing.  Fact Sheet for Patients: BloggerCourse.com  Fact Sheet for Healthcare Providers: SeriousBroker.it  This test is not yet approved or cleared by the [place]  FDA and has been authorized for detection and/or diagnosis of SARS-CoV-2 by FDA under an Emergency Use Authorization (EUA). This EUA will remain in effect (meaning this test can be used) for the duration of the COVID-19 declaration under Section 564(b)(1) of the Act, 21 U.S.C. section  360bbb-3(b)(1), unless the authorization is terminated or revoked.     Resp Syncytial Virus by PCR NEGATIVE NEGATIVE Final    Comment: (NOTE) Fact Sheet for Patients: BloggerCourse.com  Fact Sheet for Healthcare Providers: SeriousBroker.it  This test is not yet approved or cleared by the [place]  FDA and has been authorized for detection and/or diagnosis of SARS-CoV-2 by FDA under an Emergency Use Authorization (EUA). This EUA will remain in effect (meaning this test can be used) for the duration of the COVID-19 declaration under Section 564(b)(1) of the Act, 21 U.S.C. section 360bbb-3(b)(1), unless the authorization is terminated or revoked.  Performed at [facility], [address] [place], [place] [address]   Respiratory (~20 pathogens) panel by PCR     Status: None   Collection Time: 02/17/24  9:09 AM   Specimen: Nasopharyngeal Swab; Respiratory  Result Value Ref Range Status   Adenovirus NOT DETECTED NOT DETECTED Final   Coronavirus 229E NOT DETECTED NOT DETECTED Final    Comment: (NOTE) The Coronavirus on the Respiratory Panel, DOES NOT test for the novel  Coronavirus (2019 nCoV)    Coronavirus HKU1 NOT DETECTED NOT DETECTED Final   Coronavirus NL63 NOT DETECTED NOT DETECTED Final   Coronavirus OC43 NOT DETECTED NOT DETECTED Final   Metapneumovirus NOT DETECTED NOT DETECTED Final   Rhinovirus / Enterovirus NOT DETECTED NOT DETECTED Final   Influenza A NOT DETECTED NOT DETECTED Final   Influenza B NOT DETECTED NOT DETECTED Final   Parainfluenza Virus 1 NOT DETECTED NOT DETECTED Final   Parainfluenza Virus 2 NOT DETECTED NOT DETECTED Final   Parainfluenza Virus 3 NOT DETECTED NOT DETECTED Final   Parainfluenza Virus 4 NOT DETECTED NOT DETECTED Final   Respiratory Syncytial Virus NOT DETECTED NOT DETECTED Final   Bordetella pertussis NOT DETECTED NOT DETECTED Final   Bordetella Parapertussis  NOT DETECTED NOT DETECTED Final   Chlamydophila pneumoniae NOT DETECTED NOT DETECTED Final   Mycoplasma pneumoniae NOT DETECTED NOT DETECTED Final    Comment: Performed at [facility], [address], [place], [place] [address]  Blood culture (routine x 2)     Status: None   Collection Time: 02/17/24  9:21 AM   Specimen: BLOOD LEFT ARM  Result Value Ref Range Status   Specimen Description   Final    BLOOD LEFT ARM Performed at [facility], [address], [place], [place] [address]    Special Requests   Final    BOTTLES DRAWN AEROBIC AND ANAEROBIC Blood Culture adequate volume Performed at [facility], [address], [place], [place] [address]    Culture   Final    NO GROWTH 5 DAYS Performed at [facility], [address], [place], [place] [address]    Report Status  02/22/2024 FINAL  Final  Blood culture (routine x 2)     Status: None   Collection Time: 02/17/24  9:59 AM   Specimen: BLOOD  Result Value Ref Range Status   Specimen Description   Final    BLOOD SITE NOT SPECIFIED Performed at [facility], [address], [place], [place] [address]    Special Requests   Final    BOTTLES DRAWN AEROBIC AND ANAEROBIC Blood Culture adequate volume Performed at [facility], [address], [place], [place] [address]    Culture   Final    NO GROWTH 5 DAYS Performed at [facility], [address], [place], [place] [address]    Report Status 02/22/2024 FINAL  Final  MRSA Next Gen by PCR, Nasal     Status: None   Collection Time: 02/17/24  8:17 PM   Specimen: Nasal Mucosa; Nasal Swab  Result Value Ref Range Status   MRSA by PCR Next Gen NOT DETECTED NOT DETECTED Final    Comment: (NOTE) The GeneXpert MRSA Assay (FDA approved for NASAL specimens only), is one component of a comprehensive MRSA colonization surveillance program. It is not intended to diagnose MRSA infection nor to guide or monitor  treatment for MRSA infections. Test performance is not FDA approved in patients less than 31 years old. Performed at [facility], [address] [place], [place] [address]     Labs: CBC: Recent Labs  Lab 02/17/24 [phone] 02/18/24 0523 02/21/24 0624 02/23/24 0508  WBC 4.1 4.4 4.8 3.4*  NEUTROABS 2.6  --   --   --   HGB 12.2 12.0 12.6 12.3  HCT 37.4 36.6 39.9 37.2  MCV 101.1* 100.5* 100.3* 98.7  PLT 241 208 217 194   Basic Metabolic Panel: Recent Labs  Lab 02/17/24 0921 02/18/24 0523 02/18/24 1329 02/19/24 0532 02/21/24 0624 02/23/24 0508  NA 134* 135  --  135 134* 131*  K 4.3 4.5  --  4.5 4.4 4.1  CL 98 99  --  98 95* 94*  CO2 25 26  --  27 29 29   GLUCOSE 86 74  --  94 88 85  BUN 16 14  --  25* 30* 26*  CREATININE 0.59 0.60  --  0.80 0.77 0.66  CALCIUM  9.4 9.0  --  8.9 9.4 9.3  MG  --   --  2.1  --  2.4 2.4  PHOS  --   --  3.6  --  3.8 3.4   Liver Function Tests: Recent Labs  Lab 02/17/24 0921 02/18/24 0523  AST 34 30  ALT 15 13  ALKPHOS 353* 306*  BILITOT 0.6 0.7  PROT 7.5 6.2*  ALBUMIN  4.0 3.3*   CBG: No results for input(s): GLUCAP in the last 168 hours.  Discharge time spent: greater than 30 minutes.  Signed: Owen DELENA Lore, MD [facility] 02/23/2024

## 2024-02-23 NOTE — TOC Progression Note (Addendum)
 Transition of Care [facility]) - Progression Note    Patient Details  Name: Lauren Davidson MRN: [id number] Date of Birth: 1921/08/22  [facility]) CM/SW Contact  Doneta Glenys ONEIDA, RN Phone Number: 02/23/2024, 9:06 AM  Clinical Narrative:    Insurance auth still pending. 11:50 AM CM called [facility] [phone]. Discussed SNF,  transport for charge and [facility] for palliative care.   Expected Discharge Plan: Assisted Living Barriers to Discharge: Continued Medical Work up               Expected Discharge Plan and Services In-house Referral: Clinical Social Work Discharge Planning Services: NA Post Acute Care Choice: NA Living arrangements for the past 2 months: Assisted Living Facility                 DME Arranged: N/A DME Agency: NA       HH Arranged: NA HH Agency: NA         Social Drivers of Health (SDOH) Interventions SDOH Screenings   Food Insecurity: No Food Insecurity (02/17/2024)  Housing: Low Risk  (02/17/2024)  Transportation Needs: No Transportation Needs (02/17/2024)  Utilities: Not At Risk (02/17/2024)  Social Connections: Moderately Integrated (02/17/2024)  Tobacco Use: Low Risk  (02/17/2024)    Readmission Risk Interventions     No data to display

## 2024-02-24 DIAGNOSIS — I509 Heart failure, unspecified: Secondary | ICD-10-CM | POA: Diagnosis not present

## 2024-02-24 DIAGNOSIS — K219 Gastro-esophageal reflux disease without esophagitis: Secondary | ICD-10-CM | POA: Diagnosis not present

## 2024-02-24 DIAGNOSIS — G8929 Other chronic pain: Secondary | ICD-10-CM | POA: Diagnosis not present

## 2024-02-25 DIAGNOSIS — R2689 Other abnormalities of gait and mobility: Secondary | ICD-10-CM | POA: Diagnosis not present

## 2024-02-25 DIAGNOSIS — I5032 Chronic diastolic (congestive) heart failure: Secondary | ICD-10-CM | POA: Diagnosis not present

## 2024-02-25 DIAGNOSIS — M6281 Muscle weakness (generalized): Secondary | ICD-10-CM | POA: Diagnosis not present

## 2024-02-25 DIAGNOSIS — E440 Moderate protein-calorie malnutrition: Secondary | ICD-10-CM | POA: Diagnosis not present

## 2024-02-26 DIAGNOSIS — L259 Unspecified contact dermatitis, unspecified cause: Secondary | ICD-10-CM | POA: Diagnosis not present

## 2024-03-01 DIAGNOSIS — L259 Unspecified contact dermatitis, unspecified cause: Secondary | ICD-10-CM | POA: Diagnosis not present

## 2024-03-01 DIAGNOSIS — K219 Gastro-esophageal reflux disease without esophagitis: Secondary | ICD-10-CM | POA: Diagnosis not present

## 2024-03-03 DIAGNOSIS — E440 Moderate protein-calorie malnutrition: Secondary | ICD-10-CM | POA: Diagnosis not present

## 2024-03-03 DIAGNOSIS — R2689 Other abnormalities of gait and mobility: Secondary | ICD-10-CM | POA: Diagnosis not present

## 2024-03-03 DIAGNOSIS — R296 Repeated falls: Secondary | ICD-10-CM | POA: Diagnosis not present

## 2024-03-03 DIAGNOSIS — M6281 Muscle weakness (generalized): Secondary | ICD-10-CM | POA: Diagnosis not present

## 2024-03-03 DIAGNOSIS — I5032 Chronic diastolic (congestive) heart failure: Secondary | ICD-10-CM | POA: Diagnosis not present

## 2024-03-04 DIAGNOSIS — I509 Heart failure, unspecified: Secondary | ICD-10-CM | POA: Diagnosis not present

## 2024-03-04 DIAGNOSIS — K219 Gastro-esophageal reflux disease without esophagitis: Secondary | ICD-10-CM | POA: Diagnosis not present

## 2024-03-04 DIAGNOSIS — L259 Unspecified contact dermatitis, unspecified cause: Secondary | ICD-10-CM | POA: Diagnosis not present

## 2024-03-04 DIAGNOSIS — G8929 Other chronic pain: Secondary | ICD-10-CM | POA: Diagnosis not present

## 2024-03-05 DIAGNOSIS — M79674 Pain in right toe(s): Secondary | ICD-10-CM | POA: Diagnosis not present

## 2024-03-09 DIAGNOSIS — Z681 Body mass index (BMI) 19 or less, adult: Secondary | ICD-10-CM | POA: Diagnosis not present

## 2024-03-09 DIAGNOSIS — I509 Heart failure, unspecified: Secondary | ICD-10-CM | POA: Diagnosis not present

## 2024-03-10 DIAGNOSIS — R2689 Other abnormalities of gait and mobility: Secondary | ICD-10-CM | POA: Diagnosis not present

## 2024-03-10 DIAGNOSIS — I5032 Chronic diastolic (congestive) heart failure: Secondary | ICD-10-CM | POA: Diagnosis not present

## 2024-03-10 DIAGNOSIS — M6281 Muscle weakness (generalized): Secondary | ICD-10-CM | POA: Diagnosis not present

## 2024-03-10 DIAGNOSIS — E440 Moderate protein-calorie malnutrition: Secondary | ICD-10-CM | POA: Diagnosis not present

## 2024-03-16 DIAGNOSIS — G8929 Other chronic pain: Secondary | ICD-10-CM | POA: Diagnosis not present

## 2024-03-16 DIAGNOSIS — I509 Heart failure, unspecified: Secondary | ICD-10-CM | POA: Diagnosis not present

## 2024-03-16 DIAGNOSIS — D649 Anemia, unspecified: Secondary | ICD-10-CM | POA: Diagnosis not present

## 2024-03-16 DIAGNOSIS — K219 Gastro-esophageal reflux disease without esophagitis: Secondary | ICD-10-CM | POA: Diagnosis not present

## 2024-03-26 DIAGNOSIS — G8929 Other chronic pain: Secondary | ICD-10-CM | POA: Diagnosis not present

## 2024-03-26 DIAGNOSIS — N1831 Chronic kidney disease, stage 3a: Secondary | ICD-10-CM | POA: Diagnosis not present

## 2024-03-26 DIAGNOSIS — M3500 Sicca syndrome, unspecified: Secondary | ICD-10-CM | POA: Diagnosis not present

## 2024-03-26 DIAGNOSIS — I10 Essential (primary) hypertension: Secondary | ICD-10-CM | POA: Diagnosis not present

## 2024-03-26 DIAGNOSIS — Z23 Encounter for immunization: Secondary | ICD-10-CM | POA: Diagnosis not present

## 2024-03-26 DIAGNOSIS — I340 Nonrheumatic mitral (valve) insufficiency: Secondary | ICD-10-CM | POA: Diagnosis not present

## 2024-03-26 DIAGNOSIS — S81811D Laceration without foreign body, right lower leg, subsequent encounter: Secondary | ICD-10-CM | POA: Diagnosis not present

## 2024-03-26 DIAGNOSIS — Z0000 Encounter for general adult medical examination without abnormal findings: Secondary | ICD-10-CM | POA: Diagnosis not present

## 2024-03-26 DIAGNOSIS — I5032 Chronic diastolic (congestive) heart failure: Secondary | ICD-10-CM | POA: Diagnosis not present

## 2024-03-26 DIAGNOSIS — Z1331 Encounter for screening for depression: Secondary | ICD-10-CM | POA: Diagnosis not present

## 2024-03-26 DIAGNOSIS — K219 Gastro-esophageal reflux disease without esophagitis: Secondary | ICD-10-CM | POA: Diagnosis not present

## 2024-03-26 DIAGNOSIS — R262 Difficulty in walking, not elsewhere classified: Secondary | ICD-10-CM | POA: Diagnosis not present

## 2024-03-30 ENCOUNTER — Emergency Department (HOSPITAL_COMMUNITY)

## 2024-03-30 ENCOUNTER — Inpatient Hospital Stay (HOSPITAL_COMMUNITY)
Admission: EM | Admit: 2024-03-30 | Discharge: 2024-04-02 | DRG: 291 | Disposition: A | Discharge status: Skilled Nursing Facility | Source: Skilled Nursing Facility | Attending: Internal Medicine | Admitting: Internal Medicine

## 2024-03-30 ENCOUNTER — Encounter (HOSPITAL_COMMUNITY): Payer: Self-pay | Admitting: *Deleted

## 2024-03-30 ENCOUNTER — Other Ambulatory Visit: Payer: Self-pay

## 2024-03-30 DIAGNOSIS — R32 Unspecified urinary incontinence: Secondary | ICD-10-CM | POA: Diagnosis present

## 2024-03-30 DIAGNOSIS — I5033 Acute on chronic diastolic (congestive) heart failure: Secondary | ICD-10-CM | POA: Diagnosis present

## 2024-03-30 DIAGNOSIS — I350 Nonrheumatic aortic (valve) stenosis: Secondary | ICD-10-CM | POA: Diagnosis present

## 2024-03-30 DIAGNOSIS — M3500 Sicca syndrome, unspecified: Secondary | ICD-10-CM | POA: Diagnosis present

## 2024-03-30 DIAGNOSIS — J9601 Acute respiratory failure with hypoxia: Principal | ICD-10-CM

## 2024-03-30 DIAGNOSIS — Z79899 Other long term (current) drug therapy: Secondary | ICD-10-CM

## 2024-03-30 DIAGNOSIS — I499 Cardiac arrhythmia, unspecified: Secondary | ICD-10-CM | POA: Diagnosis not present

## 2024-03-30 DIAGNOSIS — M4856XD Collapsed vertebra, not elsewhere classified, lumbar region, subsequent encounter for fracture with routine healing: Secondary | ICD-10-CM | POA: Diagnosis present

## 2024-03-30 DIAGNOSIS — Z993 Dependence on wheelchair: Secondary | ICD-10-CM

## 2024-03-30 DIAGNOSIS — I10 Essential (primary) hypertension: Secondary | ICD-10-CM | POA: Diagnosis not present

## 2024-03-30 DIAGNOSIS — Z66 Do not resuscitate: Secondary | ICD-10-CM | POA: Diagnosis present

## 2024-03-30 DIAGNOSIS — J90 Pleural effusion, not elsewhere classified: Secondary | ICD-10-CM | POA: Diagnosis not present

## 2024-03-30 DIAGNOSIS — I509 Heart failure, unspecified: Secondary | ICD-10-CM | POA: Diagnosis not present

## 2024-03-30 DIAGNOSIS — E785 Hyperlipidemia, unspecified: Secondary | ICD-10-CM | POA: Diagnosis present

## 2024-03-30 DIAGNOSIS — I110 Hypertensive heart disease with heart failure: Secondary | ICD-10-CM | POA: Diagnosis not present

## 2024-03-30 DIAGNOSIS — R918 Other nonspecific abnormal finding of lung field: Secondary | ICD-10-CM | POA: Diagnosis not present

## 2024-03-30 DIAGNOSIS — R0602 Shortness of breath: Secondary | ICD-10-CM | POA: Diagnosis not present

## 2024-03-30 DIAGNOSIS — J811 Chronic pulmonary edema: Secondary | ICD-10-CM | POA: Diagnosis not present

## 2024-03-30 DIAGNOSIS — R262 Difficulty in walking, not elsewhere classified: Secondary | ICD-10-CM | POA: Diagnosis present

## 2024-03-30 DIAGNOSIS — J9621 Acute and chronic respiratory failure with hypoxia: Secondary | ICD-10-CM | POA: Diagnosis present

## 2024-03-30 DIAGNOSIS — I340 Nonrheumatic mitral (valve) insufficiency: Secondary | ICD-10-CM | POA: Diagnosis present

## 2024-03-30 DIAGNOSIS — Z885 Allergy status to narcotic agent status: Secondary | ICD-10-CM

## 2024-03-30 DIAGNOSIS — Z888 Allergy status to other drugs, medicaments and biological substances status: Secondary | ICD-10-CM

## 2024-03-30 DIAGNOSIS — Z743 Need for continuous supervision: Secondary | ICD-10-CM | POA: Diagnosis not present

## 2024-03-30 LAB — CBC WITH DIFFERENTIAL/PLATELET
Abs Immature Granulocytes: 0.03 K/uL (ref 0.00–0.07)
Basophils Absolute: 0 K/uL (ref 0.0–0.1)
Basophils Relative: 1 %
Eosinophils Absolute: 0.2 K/uL (ref 0.0–0.5)
Eosinophils Relative: 3 %
HCT: 36.8 % (ref 36.0–46.0)
Hemoglobin: 12.3 g/dL (ref 12.0–15.0)
Immature Granulocytes: 1 %
Lymphocytes Relative: 18 %
Lymphs Abs: 1 K/uL (ref 0.7–4.0)
MCH: 33.7 pg (ref 26.0–34.0)
MCHC: 33.4 g/dL (ref 30.0–36.0)
MCV: 100.8 fL — ABNORMAL HIGH (ref 80.0–100.0)
Monocytes Absolute: 0.8 K/uL (ref 0.1–1.0)
Monocytes Relative: 16 %
Neutro Abs: 3.2 K/uL (ref 1.7–7.7)
Neutrophils Relative %: 61 %
Platelets: 171 K/uL (ref 150–400)
RBC: 3.65 MIL/uL — ABNORMAL LOW (ref 3.87–5.11)
RDW: 14.5 % (ref 11.5–15.5)
WBC: 5.2 K/uL (ref 4.0–10.5)
nRBC: 0 % (ref 0.0–0.2)

## 2024-03-30 LAB — BASIC METABOLIC PANEL WITH GFR
Anion gap: 10 (ref 5–15)
BUN: 21 mg/dL (ref 8–23)
CO2: 26 mmol/L (ref 22–32)
Calcium: 9.3 mg/dL (ref 8.9–10.3)
Chloride: 104 mmol/L (ref 98–111)
Creatinine, Ser: 0.71 mg/dL (ref 0.44–1.00)
GFR, Estimated: >60 mL/min (ref >60)
Glucose, Bld: 85 mg/dL (ref 70–99)
Potassium: 4.2 mmol/L (ref 3.5–5.1)
Sodium: 140 mmol/L (ref 135–145)

## 2024-03-30 LAB — I-STAT CG4 LACTIC ACID, ED: Lactic Acid, Venous: 0.5 mmol/L (ref 0.5–1.9)

## 2024-03-30 LAB — MRSA NEXT GEN BY PCR, NASAL: MRSA by PCR Next Gen: DETECTED — AB

## 2024-03-30 LAB — PRO BRAIN NATRIURETIC PEPTIDE: Pro Brain Natriuretic Peptide: 1287 pg/mL — ABNORMAL HIGH (ref <300.0)

## 2024-03-30 LAB — TROPONIN T, HIGH SENSITIVITY
Troponin T High Sensitivity: 28 ng/L — ABNORMAL HIGH (ref 0–19)
Troponin T High Sensitivity: 32 ng/L — ABNORMAL HIGH (ref 0–19)

## 2024-03-30 MED ORDER — ENOXAPARIN SODIUM 30 MG/0.3ML IJ SOSY
30.0000 mg | PREFILLED_SYRINGE | INTRAMUSCULAR | Status: DC
Start: 2024-03-30 — End: 2024-03-30

## 2024-03-30 MED ORDER — ONDANSETRON HCL 4 MG/2ML IJ SOLN: 4.0000 mg | Freq: Four times a day (QID) | INTRAMUSCULAR | Status: DC | PRN

## 2024-03-30 MED ORDER — CHLORHEXIDINE GLUCONATE CLOTH 2 % EX PADS
6.0000 | MEDICATED_PAD | Freq: Every day | CUTANEOUS | Status: DC
  Administered 2024-03-30 – 2024-04-01 (×3): 6 via TOPICAL

## 2024-03-30 MED ORDER — ENOXAPARIN SODIUM 30 MG/0.3ML IJ SOSY
30.0000 mg | PREFILLED_SYRINGE | INTRAMUSCULAR | Status: DC
  Administered 2024-03-30: 30 mg via SUBCUTANEOUS
  Filled 2024-03-30: qty 0.3

## 2024-03-30 MED ORDER — FUROSEMIDE 10 MG/ML IJ SOLN
20.0000 mg | Freq: Two times a day (BID) | INTRAMUSCULAR | Status: DC
  Administered 2024-03-30 – 2024-04-02 (×7): 20 mg via INTRAVENOUS
  Filled 2024-03-30 (×7): qty 2

## 2024-03-30 MED ORDER — MUPIROCIN 2 % EX OINT
1.0000 | TOPICAL_OINTMENT | Freq: Two times a day (BID) | CUTANEOUS | Status: DC
  Administered 2024-03-30 – 2024-04-02 (×6): 1 via NASAL
  Filled 2024-03-30 (×3): qty 22

## 2024-03-30 MED ORDER — SODIUM CHLORIDE 0.9 % IV SOLN: 250.0000 mL | INTRAVENOUS | Status: AC | PRN

## 2024-03-30 MED ORDER — GUAIFENESIN ER 600 MG PO TB12
1200.0000 mg | ORAL_TABLET | Freq: Two times a day (BID) | ORAL | Status: DC
  Administered 2024-03-30 – 2024-04-02 (×7): 1200 mg via ORAL
  Filled 2024-03-30 (×7): qty 2

## 2024-03-30 MED ORDER — SODIUM CHLORIDE 0.9% FLUSH: 3.0000 mL | INTRAVENOUS | Status: DC | PRN

## 2024-03-30 MED ORDER — DOCUSATE SODIUM 100 MG PO CAPS
100.0000 mg | ORAL_CAPSULE | Freq: Two times a day (BID) | ORAL | Status: DC
  Administered 2024-03-30 – 2024-04-02 (×7): 100 mg via ORAL
  Filled 2024-03-30 (×7): qty 1

## 2024-03-30 MED ORDER — PANTOPRAZOLE SODIUM 40 MG PO TBEC
40.0000 mg | DELAYED_RELEASE_TABLET | Freq: Every morning | ORAL | Status: DC
  Administered 2024-03-30 – 2024-03-31 (×2): 40 mg via ORAL
  Filled 2024-03-30 (×2): qty 1

## 2024-03-30 MED ORDER — SODIUM CHLORIDE 0.9% FLUSH
3.0000 mL | Freq: Two times a day (BID) | INTRAVENOUS | Status: DC
  Administered 2024-03-30 – 2024-04-02 (×7): 3 mL via INTRAVENOUS

## 2024-03-30 MED ORDER — OXYCODONE HCL 5 MG PO TABS: 2.5000 mg | ORAL_TABLET | ORAL | Status: DC | PRN

## 2024-03-30 MED ORDER — ACETAMINOPHEN 325 MG PO TABS: 650.0000 mg | ORAL_TABLET | ORAL | Status: DC | PRN

## 2024-03-30 NOTE — H&P (Addendum)
 History and Physical    Lauren Davidson FMW:[id number] DOB: 1921/09/06 DOA: 03/30/2024  PCP: Dwight Trula SQUIBB, MD (Confirm with patient/family/NH records and if not entered, this has to be entered at [facility] : [facility] point of entry) Patient coming from: SNF  I have personally briefly reviewed patient's old medical records in [facility]  Chief Complaint: Cough, SOB  HPI: Lauren Davidson is a 89 y.o. female with medical history significant of chronic HFpEF, valvular heart disease including AS, MR, HLD, lumbar compression fracture, Sjogren syndrome, sent from nursing home for evaluation of hypoxia.  Symptoms started yesterday patient started to have a dry cough and increasing exertional dyspnea, at baseline she uses wheelchair to ambulate however she found it is increasingly difficult for her to carry out daily activity.  She also has orthopnea.  She denies any chest pain, no fever or chills.  She has been taking her daily 20 mg Lasix and she has a chronic urinary incontinence and wearing pad.  She was found to be hypoxic O2 saturation in the lower 80s this morning and the facility staff called EMS.  ED Course: Afebrile, not tachycardia, blood pressure 145/60 O2 sats from 85% on room air and stabilized on 2 L.  Chest x-ray showed mild interstitial congestion and CT chest without contrast showed pulmonary vascular congestion and small bilateral pleural effusion. Blood work showed troponin 28, proBNP 1200, BUN 21 creatinine 0.7, WBC 5.2 hemoglobin 12.3.  Patient was given IV Lasix 20 mg in the ED. Review of Systems: As per HPI otherwise 14 point review of systems negative.    Past Medical History:  Diagnosis Date   Closed compression fracture of L2 lumbar vertebra, initial encounter (HCC) 06/16/2022   Closed compression fracture of L4 lumbar vertebra, initial encounter (HCC) 09/17/2021   Closed displaced intertrochanteric fracture of left femur, initial encounter (HCC) 05/10/2023   Hyperlipidemia     Lumbar compression fracture (HCC) 06/12/2022    Past Surgical History:  Procedure Laterality Date   FEMUR IM NAIL Left 05/11/2023   Procedure: INTRAMEDULLARY (IM) NAIL FEMORAL;  Surgeon: Barton Drape, MD;  Location: [facility];  Service: Orthopedics;  Laterality: Left;   IR KYPHO LUMBAR INC FX REDUCE BONE BX UNI/BIL CANNULATION INC/IMAGING  06/20/2022   IR KYPHO LUMBAR INC FX REDUCE BONE BX UNI/BIL CANNULATION INC/IMAGING  09/26/2022   IR KYPHO THORACIC WITH BONE BIOPSY  09/26/2022     reports that she has never smoked. She has never used smokeless tobacco. She reports that she does not drink alcohol  and does not use drugs.  Allergies  Allergen Reactions   Statins Other (See Comments)    Muscle weakness and pain   Buprenorphine Hcl Other (See Comments)    Patient questioned this entry   Buspirone Other (See Comments)    Weakness    Lexapro  [Escitalopram ] Other (See Comments)    Muscle problems   Meperidine Other (See Comments)    Demerol- questionable allergy- reaction not recalled   Morphine And Codeine Rash    History reviewed. No pertinent family history.   Prior to Admission medications   Medication Sig Start Date End Date Taking? Authorizing Provider  acetaminophen  (TYLENOL ) 500 MG tablet Take 500 mg by mouth every 6 (six) hours as needed (for pain).    [provider]  Calcium  Carb-Cholecalciferol  (CALCIUM +D3 PO) Take 1 tablet by mouth daily.    [provider]  Dextran 70-Hypromellose (GENTEAL TEARS) 0.1-0.3 % SOLN Place 1 drop into both eyes every 12 (twelve)  hours as needed (dry eyes).    [provider]  docusate sodium  (COLACE) 100 MG capsule Take 1 capsule (100 mg total) by mouth 2 (two) times daily. 09/20/21   Cindy Garnette POUR, MD  ferrous gluconate  (FERGON) 324 MG tablet Take 324 mg by mouth daily with breakfast.    [provider]  furosemide (LASIX) 20 MG tablet Take 1 tablet (20 mg total) by mouth daily. 02/23/24   Regalado,  Belkys A, MD  guaiFENesin (MUCINEX) 600 MG 12 hr tablet Take 2 tablets (1,200 mg total) by mouth 2 (two) times daily. 02/23/24   Regalado, Belkys A, MD  lidocaine  4 % Place 1 patch onto the skin daily.    [provider]  Multiple Vitamins-Minerals (PRESERVISION AREDS 2) CAPS Take 1 capsule by mouth in the morning and at bedtime.    [provider]  oxyCODONE  (OXY IR/ROXICODONE ) 5 MG immediate release tablet Take 0.5 tablets (2.5 mg total) by mouth every 4 (four) hours as needed for moderate pain (pain score 4-6). 02/23/24   Regalado, Belkys A, MD  pantoprazole  (PROTONIX ) 40 MG tablet Take 40 mg by mouth every morning. 04/27/23   [provider]    Physical Exam: Vitals:   03/30/24 0643 03/30/24 0647  BP:  (!) 145/60  Pulse:  87  Resp:  20  Temp:  98.5 F (36.9 C)  SpO2: (!) 85% 95%    Constitutional: NAD, calm, comfortable Vitals:   03/30/24 0643 03/30/24 0647  BP:  (!) 145/60  Pulse:  87  Resp:  20  Temp:  98.5 F (36.9 C)  SpO2: (!) 85% 95%   Eyes: PERRL, lids and conjunctivae normal ENMT: Mucous membranes are moist. Posterior pharynx clear of any exudate or lesions.Normal dentition.  Neck: normal, supple, no masses, no thyromegaly Respiratory: clear to auscultation bilaterally, no wheezing, fine crackles on bilateral lower fields, increasing respiratory effort. No accessory muscle use.  Cardiovascular: Regular rate and rhythm, systolic murmur. 1+ extremity edema. 2+ pedal pulses. No carotid bruits.  Abdomen: no tenderness, no masses palpated. No hepatosplenomegaly. Bowel sounds positive.  Musculoskeletal: no clubbing / cyanosis. No joint deformity upper and lower extremities. Good ROM, no contractures. Normal muscle tone.  Skin: no rashes, lesions, ulcers. No induration Neurologic: CN 2-12 grossly intact. Sensation intact, DTR normal. Strength 5/5 in all 4.  Psychiatric: Normal judgment and insight. Alert and oriented x 3. Normal mood.     Labs on  Admission: I have personally reviewed following labs and imaging studies  CBC: Recent Labs  Lab 03/30/24 0651  WBC 5.2  NEUTROABS 3.2  HGB 12.3  HCT 36.8  MCV 100.8*  PLT 171   Basic Metabolic Panel: Recent Labs  Lab 03/30/24 0651  NA 140  K 4.2  CL 104  CO2 26  GLUCOSE 85  BUN 21  CREATININE 0.71  CALCIUM  9.3   GFR: CrCl cannot be calculated (Unknown ideal weight.). Liver Function Tests: No results for input(s): AST, ALT, ALKPHOS, BILITOT, PROT, ALBUMIN  in the last 168 hours. No results for input(s): LIPASE, AMYLASE in the last 168 hours. No results for input(s): AMMONIA in the last 168 hours. Coagulation Profile: No results for input(s): INR, PROTIME in the last 168 hours. Cardiac Enzymes: No results for input(s): CKTOTAL, CKMB, CKMBINDEX, TROPONINI in the last 168 hours. BNP (last 3 results) Recent Labs    02/17/24 0921 03/30/24 0651  PROBNP 976.0* 1,287.0*   HbA1C: No results for input(s): HGBA1C in the last 72 hours. CBG:  No results for input(s): GLUCAP in the last 168 hours. Lipid Profile: No results for input(s): CHOL, HDL, LDLCALC, TRIG, CHOLHDL, LDLDIRECT in the last 72 hours. Thyroid  Function Tests: No results for input(s): TSH, T4TOTAL, FREET4, T3FREE, THYROIDAB in the last 72 hours. Anemia Panel: No results for input(s): VITAMINB12, FOLATE, FERRITIN, TIBC, IRON , RETICCTPCT in the last 72 hours. Urine analysis:    Component Value Date/Time   COLORURINE YELLOW 10/16/2023 1614   APPEARANCEUR CLOUDY (A) 10/16/2023 1614   LABSPEC 1.011 10/16/2023 1614   PHURINE 8.0 10/16/2023 1614   GLUCOSEU NEGATIVE 10/16/2023 1614   HGBUR NEGATIVE 10/16/2023 1614   BILIRUBINUR NEGATIVE 10/16/2023 1614   KETONESUR NEGATIVE 10/16/2023 1614   PROTEINUR NEGATIVE 10/16/2023 1614   NITRITE NEGATIVE 10/16/2023 1614   LEUKOCYTESUR LARGE (A) 10/16/2023 1614    Radiological Exams on Admission: CT  CHEST WO CONTRAST Result Date: 03/30/2024 EXAM: CT CHEST WITHOUT CONTRAST 03/30/2024 08:06:34 AM TECHNIQUE: CT of the chest was performed without the administration of intravenous contrast. Multiplanar reformatted images are provided for review. Automated exposure control, iterative reconstruction, and/or weight based adjustment of the mA/kV was utilized to reduce the radiation dose to as low as reasonably achievable. COMPARISON: None available. CLINICAL HISTORY: Respiratory illness, nondiagnostic xray. FINDINGS: MEDIASTINUM: Heart: Moderate cardiomegaly. Dense multivessel coronary atherosclerosis. Central airways: Right tracheal diverticulum measuring 1.1 cm. The central airways are otherwise clear. Esophagus: Ingested material within the mid and upper esophagus. Vessels: Extensive aortic atherosclerosis. LYMPH NODES: No mediastinal, hilar or axillary lymphadenopathy. LUNGS AND PLEURA: Biapical pleuroparenchymal scarring, unchanged. Redemonstrated mosaic attenuation throughout the lungs, with an upper lobe predominance, likely reflecting changes of chronic small vessel disease. Multifocal subsegmental atelectasis in the lower lobes. Unchanged branching and calcification noted in the right lower lobe likely related to prior infection or aspiration. Trace right and small left pleural effusions, overall decreased in size in the interim. No pneumothorax. SOFT TISSUES/BONES: No acute abnormality of the bones or soft tissues. Diffuse osteopenia. Chronic, healed fracture deformity of the upper sternal body. Unchanged chronic multilevel thoracic compression fractures with multilevel vertebroplasty with cement at the thoracolumbar junction. UPPER ABDOMEN: Limited images of the upper abdomen demonstrates no acute abnormality. IMPRESSION: 1. Mosaic attenuation throughout the lungs, with an upper lobe predominance, likely reflecting changes of chronic small vessel disease. Mild pulmonary edema could also have this appearance  in the correct clinical context. 2. Trace right and small left pleural effusions, overall decreased in size in the interim. 3. Ingested material in the mid and upper esophagus, likely due to incompletely emptied or gastroesophageal reflux disease. Electronically signed by: Rogelia Myers MD 03/30/2024 08:57 AM EST RP Workstation: [id number]   DG Chest Port 1 View Result Date: 03/30/2024 CLINICAL DATA:  Shortness of breath. EXAM: PORTABLE CHEST 1 VIEW COMPARISON:  02/17/2024 FINDINGS: The cardio pericardial silhouette is enlarged. Interstitial markings are diffusely coarsened with chronic features. Interstitial pulmonary edema pattern seen previously has decreased in the interval. There is persistent left base atelectasis or infiltrate. Possible tiny bilateral effusions. Bones are diffusely demineralized. Telemetry leads overlie the chest. IMPRESSION: 1. Interval decrease in interstitial pulmonary edema pattern. 2. Persistent left base atelectasis or infiltrate. Electronically Signed   By: Camellia Candle M.D.   On: 03/30/2024 07:16    EKG: Independently reviewed.  Sinus rhythm, chronic RBBB, no acute ST changes.  Assessment/Plan Principal Problem:   CHF (congestive heart failure) (HCC) Active Problems:   Acute CHF (congestive heart failure) (HCC)  (please populate well all problems here in Problem List. (  For example, if patient is on BP meds at home and you resume or decide to hold them, it is a problem that needs to be her. Same for CAD, COPD, HLD and so on)  Acute hypoxic respiratory failure Acute on chronic HFpEF decompensation - Patient has both symptoms and signs of fluid overload, plan to continue IV diuresis Lasix 20 mg twice daily - Echocardiogram was done within 2 months, will not repeat echo at this time. - Other DDx, low suspicion for pneumonia, monitor off antibiotics.  HTN - Controlled, on IV Lasix.  Deconditioning - PT evaluation  Sjogren syndrome - Chronic tremors, educated  the patient regarding fluid restriction for Hx of CHF.  DVT prophylaxis: Lovenox  Code Status: DNR/DNI Family Communication: None at bedside Disposition Plan: Expect less than 2 midnight hospital stay Consults called: None Admission status: Telemetry observation  Cort ONEIDA Mana MD [facility] Pager [phone]  03/30/2024, 9:58 AM

## 2024-03-30 NOTE — Consult Note (Addendum)
 WOC Nurse Consult  patient with history of abrasions to B lower legs; last seen by dermatology 02/11/2024 with use of Dermend cream which is not on formulary at [facility]; has been followed by [facility] for local wound care  Reason for Consult:R lower leg wound  Wound type: full thickness r/t trauma  Pressure Injury POA: NA not pressure related  Measurement: see nursing flowsheet  Wound bed: black eschar with some lifting superior aspect with red moist tissue noted  Drainage (amount, consistency, odor) see nursing flowsheet  Periwound:chronic discoloration likely consistent with venous changes  Dressing procedure/placement/frequency: Cleanse R lower leg wound with Vashe wound cleanser, do not rinse.  Apply Xeroform gauze ([place] [phone]) to wound bed daily and secure with silicone foam.  May lift foam daily to replace Xeroform, change silicone foam q3 days and prn soiling.   POC discussed with bedside nurse. WOC team will not follow. Re-consult if further needs arise.   Thank you,    Powell Bar MSN, RN-BC, [employer]

## 2024-03-30 NOTE — Plan of Care (Signed)

## 2024-03-30 NOTE — ED Provider Notes (Signed)
 She is seen after prior ED provider.  Patient is appropriate for admission.  Hospitalist service made aware of case.   Laurice Maude BROCKS, MD 03/30/24 [phone]

## 2024-03-30 NOTE — ED Triage Notes (Signed)
 Pt arrived with [facility] from [facility] for SOB. Fire dept got initial sats in the mid 80s, placed pt on a NRB which improved sats. EMS able to titrate oxygen down to 2L on a [place].Pt denies CP, has chronic back pain. 18g IV to right forearm

## 2024-03-30 NOTE — ED Provider Notes (Signed)
  EMERGENCY DEPARTMENT AT [facility] Provider Note   CSN: [id number] Arrival date & time: 03/30/24  [id number]     Patient presents with: Shortness of Breath   Lauren Davidson is a 89 y.o. female.   The history is provided by the patient.  Shortness of Breath  She has history of hyperlipidemia, heart failure and comes in by ambulance because of shortness of breath and hypoxia.  She is not on home oxygen, was noted to be short of breath this morning.  Fire rescue noted oxygen saturation in the mid 80s and placed her on a nonrebreather mask.  EMS switched her to nasal cannula and transported her here.  She denies fever, chills, sweats.  She denies chest pain, heaviness, tightness, pressure.  There has been no cough.  She is a poor historian, I was not able to determine if she was having any dyspnea yesterday despite asking numerous times.    Prior to Admission medications   Medication Sig Start Date End Date Taking? Authorizing Provider  acetaminophen  (TYLENOL ) 500 MG tablet Take 500 mg by mouth every 6 (six) hours as needed (for pain).    [provider]  Calcium  Carb-Cholecalciferol  (CALCIUM +D3 PO) Take 1 tablet by mouth daily.    [provider]  Dextran 70-Hypromellose (GENTEAL TEARS) 0.1-0.3 % SOLN Place 1 drop into both eyes every 12 (twelve) hours as needed (dry eyes).    [provider]  docusate sodium  (COLACE) 100 MG capsule Take 1 capsule (100 mg total) by mouth 2 (two) times daily. 09/20/21   Cindy Garnette POUR, MD  ferrous gluconate  (FERGON) 324 MG tablet Take 324 mg by mouth daily with breakfast.    [provider]  furosemide (LASIX) 20 MG tablet Take 1 tablet (20 mg total) by mouth daily. 02/23/24   Regalado, Belkys A, MD  guaiFENesin (MUCINEX) 600 MG 12 hr tablet Take 2 tablets (1,200 mg total) by mouth 2 (two) times daily. 02/23/24   Regalado, Belkys A, MD  lidocaine  4 % Place 1 patch onto the skin daily.    [provider]  Multiple Vitamins-Minerals (PRESERVISION AREDS 2) CAPS Take 1 capsule by mouth in the morning and at bedtime.    [provider]  oxyCODONE  (OXY IR/ROXICODONE ) 5 MG immediate release tablet Take 0.5 tablets (2.5 mg total) by mouth every 4 (four) hours as needed for moderate pain (pain score 4-6). 02/23/24   Regalado, Belkys A, MD  pantoprazole  (PROTONIX ) 40 MG tablet Take 40 mg by mouth every morning. 04/27/23   [provider]    Allergies: Statins, Buprenorphine hcl, Buspirone, Lexapro  [escitalopram ], Meperidine, and Morphine and codeine    Review of Systems  Respiratory:  Positive for shortness of breath.   All other systems reviewed and are negative.   Updated Vital Signs BP (!) 145/60 (BP Location: Left Arm)   Pulse 87   Temp 98.5 F (36.9 C)   Resp 20   SpO2 95%   Physical Exam Vitals and nursing note reviewed.   89 year old female, resting comfortably and in no acute distress. Vital signs are significant for mildly elevated blood pressure. Oxygen saturation is 95%, which is normal, but only maintained with supplemental oxygen. Head is normocephalic and atraumatic. PERRLA, EOMI. Oropharynx is clear. Neck is nontender and supple without adenopathy or JVD. Back is nontender.  There is no presacral edema. Lungs have bibasilar rales going about one third of the way up.  There are no wheezes  or rhonchi. Chest is nontender. Heart has regular rate and rhythm without murmur. Abdomen is soft, flat, nontendere. Extremities have no cyanosis or edema. Skin is warm and dry without rash. Neurologic: Awake and alert, moves all extremities equally.  (all labs ordered are listed, but only abnormal results are displayed) Labs Reviewed  CULTURE, BLOOD (ROUTINE X 2)  CULTURE, BLOOD (ROUTINE X 2)  PRO BRAIN NATRIURETIC PEPTIDE  BASIC METABOLIC PANEL WITH GFR  CBC WITH DIFFERENTIAL/PLATELET  I-STAT CG4 LACTIC ACID, ED  TROPONIN T, HIGH SENSITIVITY     EKG: EKG Interpretation Date/Time:  Tuesday March 30 2024 06:53:53 EST Ventricular Rate:  82 PR Interval:  241 QRS Duration:  130 QT Interval:  430 QTC Calculation: 503 R Axis:   96  Text Interpretation: Sinus rhythm Prolonged PR interval Probable left atrial enlargement RBBB and LPFB Baseline wander in lead(s) V2 When compared with ECG of 02/17/2024, No significant change was found Confirmed by Raford Lenis ([id number]) on 03/30/2024 7:26:06 AM  Radiology: [facility] Chest Port 1 View Result Date: 03/30/2024 CLINICAL DATA:  Shortness of breath. EXAM: PORTABLE CHEST 1 VIEW COMPARISON:  02/17/2024 FINDINGS: The cardio pericardial silhouette is enlarged. Interstitial markings are diffusely coarsened with chronic features. Interstitial pulmonary edema pattern seen previously has decreased in the interval. There is persistent left base atelectasis or infiltrate. Possible tiny bilateral effusions. Bones are diffusely demineralized. Telemetry leads overlie the chest. IMPRESSION: 1. Interval decrease in interstitial pulmonary edema pattern. 2. Persistent left base atelectasis or infiltrate. Electronically Signed   By: Camellia Candle M.D.   On: 03/30/2024 07:16     Procedures   Medications Ordered in the ED - No data to display                                  Medical Decision Making Amount and/or Complexity of Data Reviewed Labs: ordered. Radiology: ordered.   Shortness of breath with hypoxia.  This is a presentation with a wide range of treatment options and carries with it a high risk of morbidity and complications.  Differential diagnosis includes, but is not limited to, heart failure exacerbation, pneumonia, pulmonary embolism.  I have reviewed her past records, and note hospitalization 02/17/2024-02/23/2024 for acute heart failure.  Echocardiogram in 02/21/2024 showed left ventricular ejection fraction 60-65% with grade 1 diastolic dysfunction.  I have ordered workup including ECG, chest x-ray,  screening labs including troponin and BNP.  Chest x-ray shows interstitial edema pattern which is slightly improved compared with prior, also persistent left base atelectasis or infiltrate.  Have independently viewed the image, and agree with the radiologist's interpretation.  Clinically, I feel the presentation is more consistent with heart failure than pneumonia and I have not ordered antibiotics at this point.  I have reviewed her electrocardiogram, and my interpretation is right bundle branch block and left posterior fascicular block unchanged from prior.  Labs are pending.  Case is signed out to Dr. Laurice.     Final diagnoses:  Acute respiratory failure with hypoxemia [facility])    ED Discharge Orders     None          Raford Lenis, MD 03/30/24 [phone]

## 2024-03-30 NOTE — ED Notes (Signed)
 Patient transported to CT

## 2024-03-31 ENCOUNTER — Observation Stay (HOSPITAL_COMMUNITY)

## 2024-03-31 DIAGNOSIS — M6281 Muscle weakness (generalized): Secondary | ICD-10-CM | POA: Diagnosis not present

## 2024-03-31 DIAGNOSIS — I110 Hypertensive heart disease with heart failure: Secondary | ICD-10-CM | POA: Diagnosis not present

## 2024-03-31 DIAGNOSIS — Z79899 Other long term (current) drug therapy: Secondary | ICD-10-CM | POA: Diagnosis not present

## 2024-03-31 DIAGNOSIS — Z885 Allergy status to narcotic agent status: Secondary | ICD-10-CM | POA: Diagnosis not present

## 2024-03-31 DIAGNOSIS — J9621 Acute and chronic respiratory failure with hypoxia: Secondary | ICD-10-CM | POA: Diagnosis not present

## 2024-03-31 DIAGNOSIS — Z993 Dependence on wheelchair: Secondary | ICD-10-CM | POA: Diagnosis not present

## 2024-03-31 DIAGNOSIS — G8929 Other chronic pain: Secondary | ICD-10-CM | POA: Diagnosis not present

## 2024-03-31 DIAGNOSIS — Z888 Allergy status to other drugs, medicaments and biological substances status: Secondary | ICD-10-CM | POA: Diagnosis not present

## 2024-03-31 DIAGNOSIS — I5031 Acute diastolic (congestive) heart failure: Secondary | ICD-10-CM | POA: Diagnosis not present

## 2024-03-31 DIAGNOSIS — I350 Nonrheumatic aortic (valve) stenosis: Secondary | ICD-10-CM | POA: Diagnosis not present

## 2024-03-31 DIAGNOSIS — Z66 Do not resuscitate: Secondary | ICD-10-CM | POA: Diagnosis not present

## 2024-03-31 DIAGNOSIS — R2689 Other abnormalities of gait and mobility: Secondary | ICD-10-CM | POA: Diagnosis not present

## 2024-03-31 DIAGNOSIS — Z9181 History of falling: Secondary | ICD-10-CM | POA: Diagnosis not present

## 2024-03-31 DIAGNOSIS — R32 Unspecified urinary incontinence: Secondary | ICD-10-CM | POA: Diagnosis not present

## 2024-03-31 DIAGNOSIS — I509 Heart failure, unspecified: Secondary | ICD-10-CM | POA: Diagnosis not present

## 2024-03-31 DIAGNOSIS — R2681 Unsteadiness on feet: Secondary | ICD-10-CM | POA: Diagnosis not present

## 2024-03-31 DIAGNOSIS — I340 Nonrheumatic mitral (valve) insufficiency: Secondary | ICD-10-CM | POA: Diagnosis not present

## 2024-03-31 DIAGNOSIS — M4856XD Collapsed vertebra, not elsewhere classified, lumbar region, subsequent encounter for fracture with routine healing: Secondary | ICD-10-CM | POA: Diagnosis not present

## 2024-03-31 DIAGNOSIS — R918 Other nonspecific abnormal finding of lung field: Secondary | ICD-10-CM | POA: Diagnosis not present

## 2024-03-31 DIAGNOSIS — E785 Hyperlipidemia, unspecified: Secondary | ICD-10-CM | POA: Diagnosis not present

## 2024-03-31 DIAGNOSIS — S72142D Displaced intertrochanteric fracture of left femur, subsequent encounter for closed fracture with routine healing: Secondary | ICD-10-CM | POA: Diagnosis not present

## 2024-03-31 DIAGNOSIS — M3500 Sicca syndrome, unspecified: Secondary | ICD-10-CM | POA: Diagnosis not present

## 2024-03-31 DIAGNOSIS — J811 Chronic pulmonary edema: Secondary | ICD-10-CM | POA: Diagnosis not present

## 2024-03-31 DIAGNOSIS — I5033 Acute on chronic diastolic (congestive) heart failure: Secondary | ICD-10-CM | POA: Diagnosis not present

## 2024-03-31 DIAGNOSIS — R0602 Shortness of breath: Secondary | ICD-10-CM | POA: Diagnosis not present

## 2024-03-31 DIAGNOSIS — R262 Difficulty in walking, not elsewhere classified: Secondary | ICD-10-CM | POA: Diagnosis not present

## 2024-03-31 DIAGNOSIS — J9601 Acute respiratory failure with hypoxia: Secondary | ICD-10-CM | POA: Diagnosis not present

## 2024-03-31 LAB — BASIC METABOLIC PANEL WITH GFR
Anion gap: 8 (ref 5–15)
BUN: 20 mg/dL (ref 8–23)
CO2: 28 mmol/L (ref 22–32)
Calcium: 8.9 mg/dL (ref 8.9–10.3)
Chloride: 100 mmol/L (ref 98–111)
Creatinine, Ser: 0.73 mg/dL (ref 0.44–1.00)
GFR, Estimated: >60 mL/min (ref >60)
Glucose, Bld: 79 mg/dL (ref 70–99)
Potassium: 4.2 mmol/L (ref 3.5–5.1)
Sodium: 136 mmol/L (ref 135–145)

## 2024-03-31 MED ORDER — POLYVINYL ALCOHOL 1.4 % OP SOLN
1.0000 [drp] | Freq: Two times a day (BID) | OPHTHALMIC | Status: DC | PRN
  Administered 2024-03-31 – 2024-04-01 (×2): 1 [drp] via OPHTHALMIC
  Filled 2024-03-31: qty 15

## 2024-03-31 MED ORDER — FAMOTIDINE 20 MG PO TABS: 20.0000 mg | ORAL_TABLET | Freq: Every day | ORAL | Status: DC | PRN

## 2024-03-31 MED ORDER — LIDOCAINE 5 % EX PTCH
1.0000 | MEDICATED_PATCH | CUTANEOUS | Status: DC
  Administered 2024-03-31 – 2024-04-02 (×3): 1 via TRANSDERMAL
  Filled 2024-03-31 (×3): qty 1

## 2024-03-31 MED ORDER — AMLODIPINE BESYLATE 5 MG PO TABS
2.5000 mg | ORAL_TABLET | Freq: Every day | ORAL | Status: DC
  Administered 2024-03-31 – 2024-04-02 (×3): 2.5 mg via ORAL
  Filled 2024-03-31 (×3): qty 1

## 2024-03-31 MED ORDER — PANTOPRAZOLE SODIUM 40 MG PO TBEC
40.0000 mg | DELAYED_RELEASE_TABLET | Freq: Every day | ORAL | Status: DC
  Administered 2024-04-01 – 2024-04-02 (×2): 40 mg via ORAL
  Filled 2024-03-31 (×2): qty 1

## 2024-03-31 MED ORDER — DEXTRAN 70-HYPROMELLOSE 0.1-0.3 % OP SOLN: 1.0000 [drp] | Freq: Two times a day (BID) | OPHTHALMIC | Status: DC | PRN

## 2024-03-31 NOTE — TOC Initial Note (Signed)
 Transition of Care [facility]) - Initial/Assessment Note   Patient Details  Name: Lauren Davidson MRN: [id number] Date of Birth: 1922/02/01  Transition of Care [facility]) CM/SW Contact:    Duwaine GORMAN [name], LCSW Phone Number: 03/31/2024, 10:03 AM  Clinical Narrative: CSW met with patient to complete assessment. Patient confirmed she resides at [facility]F and plans to return at discharge. Care management consulted for heart failure screening, but patient has been referred to heart failure navigation team so consult will be cleared at this time. CSW spoke with Jama Baron at [facility] and confirmed patient can return at discharge. Discharge summary and FL2 will need to be faxed to [facility] ([phone]). FL2 started. PT consulted. Care management to follow.  Expected Discharge Plan: Assisted Living Barriers to Discharge: Continued Medical Work up  Patient Goals and CMS Choice Patient states their goals for this hospitalization and ongoing recovery are:: Return to [facility]  Expected Discharge Plan and Services In-house Referral: Clinical Social Work Living arrangements for the past 2 months: Assisted Living Facility           DME Arranged: N/A DME Agency: NA  Prior Living Arrangements/Services Living arrangements for the past 2 months: Assisted Living Facility Lives with:: Facility Resident Patient language and need for interpreter reviewed:: Yes Do you feel safe going back to the place where you live?: Yes      Need for Family Participation in Patient Care: No (Comment) Care giver support system in place?: Yes (comment) Criminal Activity/Legal Involvement Pertinent to Current Situation/Hospitalization: No - Comment as needed  Activities of Daily Living ADL Screening (condition at time of admission) Independently performs ADLs?: No Does the patient have a NEW difficulty with bathing/dressing/toileting/self-feeding that is expected to last >3 days?: No Does the patient have a  NEW difficulty with getting in/out of bed, walking, or climbing stairs that is expected to last >3 days?: Yes (Initiates electronic notice to provider for possible PT consult) Does the patient have a NEW difficulty with communication that is expected to last >3 days?: No Is the patient deaf or have difficulty hearing?: Yes Does the patient have difficulty seeing, even when wearing glasses/contacts?: No Does the patient have difficulty concentrating, remembering, or making decisions?: No  Emotional Assessment Appearance:: Appears stated age Attitude/Demeanor/Rapport: Engaged Affect (typically observed): Appropriate Orientation: : Oriented to Self, Oriented to Place, Oriented to  Time, Oriented to Situation Alcohol  / Substance Use: Not Applicable Psych Involvement: No (comment)  Admission diagnosis:  CHF (congestive heart failure) (HCC) [I50.9] Acute respiratory failure with hypoxemia (HCC) [J96.01] Patient Active Problem List   Diagnosis Date Noted   CHF (congestive heart failure) (HCC) 03/30/2024   Acute CHF (congestive heart failure) (HCC) 02/17/2024   Fall at home, initial encounter 10/22/2023   Frail elderly 10/22/2023   DNR (do not resuscitate)/DNI(Do Not Intubate) 10/22/2023   Closed fracture of multiple ribs of right side 10/16/2023   HOH (hard of hearing) 05/16/2023   Back pain 09/17/2021   Lumbar spinal stenosis 09/17/2021   HTN (hypertension) 09/17/2021   HLD (hyperlipidemia) 09/17/2021   Gastroesophageal reflux disease without esophagitis 03/01/2019   Sjogren's syndrome 05/18/2015   Chronic kidney disease (CKD), stage II (mild) 05/18/2015   Generalized anxiety disorder 05/18/2015   Osteoporosis 05/18/2015   Status post intraocular lens implant 11/14/2011   PCP:  Dwight Trula SQUIBB, MD Pharmacy:  No Pharmacies Listed  Social Drivers of Health (SDOH) Social History: SDOH Screenings   Food Insecurity: No Food Insecurity (03/30/2024)  Housing: Low Risk  (03/30/2024)   Transportation Needs: No Transportation Needs (03/30/2024)  Utilities: Not At Risk (03/30/2024)  Social Connections: Moderately Integrated (03/30/2024)  Tobacco Use: Low Risk  (03/30/2024)   SDOH Interventions:    Readmission Risk Interventions     No data to display

## 2024-03-31 NOTE — Care Management Obs Status (Signed)
 MEDICARE OBSERVATION STATUS NOTIFICATION   Patient Details  Name: Lauren Davidson MRN: [id number] Date of Birth: 1922/03/10   Medicare Observation Status Notification Given:  Yes    Duwaine GORMAN [name], LCSW 03/31/2024, 9:33 AM

## 2024-03-31 NOTE — Evaluation (Addendum)
 Physical Therapy Evaluation Patient Details Name: Lauren Davidson MRN: [id number] DOB: 08-03-1921 Today's Date: 03/31/2024  History of Present Illness  Pt is a 89 y.o. female admitted 03/30/24 with acute hypoxia from ALF.  PMH significant for heart failure with preserved EF, lumbar compression fractures, hyperlipidemia, and L IM nail.  Clinical Impression  Pt admitted with above diagnosis.  Pt currently with functional limitations due to the deficits listed below (see PT Problem List). Pt will benefit from acute skilled PT to increase their independence and safety with mobility to allow discharge.    The patient was placed on RA x ~ 25 for  mobility with SPo2 remaining > 95%  HR 60's. RN aware that SPO2 remained > 95% and  allowed patient remain off of O2 and will monitor patient. Patient does reports feeling SOB with activity. Patient resides in ALF with assistance for transfers  at [facility] level. Limited ambulation past with PT. Does not appear that patient is  receiving HHPT> Patient can benefit from HHPT if returns to ALF.  Patient noted to strangle when swabbing mouth  with water soaked mouth sponge. RN aware.  Patient reports she has to et and drink slowly due to swallow difficulties.         If plan is discharge home, recommend the following: A lot of help with walking and/or transfers;A little help with bathing/dressing/bathroom;Assist for transportation;Help with stairs or ramp for entrance   Can travel by private vehicle        Equipment Recommendations None recommended by PT  Recommendations for Other Services       Functional Status Assessment Patient has had a recent decline in their functional status and/or demonstrates limited ability to make significant improvements in function in a reasonable and predictable amount of time     Precautions / Restrictions Precautions Precautions: Fall Precaution/Restrictions Comments: monitor sats Restrictions Weight Bearing Restrictions  Per Provider Order: No      Mobility  Bed Mobility               General bed mobility comments: in recliner    Transfers Overall transfer level: Needs assistance Equipment used: Rolling walker (2 wheels) Transfers: Sit to/from Stand Sit to Stand: Mod assist, +2 physical assistance, +2 safety/equipment, From elevated surface           General transfer comment: max assist to  power to stand  from recliner( pillow padded).  patient able to step in place x 10, on ?Ra , SPO2 95%>. Patient seated rest break and stood again and stepped in place x 5 at RW.  Then  stood  a third time  at [facility] but sat  quickly and reported fatigue and SOB.    Ambulation/Gait                  Stairs            Wheelchair Mobility     Tilt Bed    Modified Rankin (Stroke Patients Only)       Balance Overall balance assessment: Needs assistance, History of Falls Sitting-balance support: Feet supported, Bilateral upper extremity supported Sitting balance-Leahy Scale: Fair   Postural control: Posterior lean Standing balance support: Reliant on assistive device for balance, During functional activity, Bilateral upper extremity supported Standing balance-Leahy Scale: Poor                               Pertinent Vitals/Pain Pain  Assessment Pain Assessment: No/denies pain    Home Living Family/patient expects to be discharged to:: Assisted living                 Home Equipment: Rollator (4 wheels) Additional Comments: resides at [facility]F, transfers with assistance, limited ambulation    Prior Function Prior Level of Function : Needs assist       Physical Assist : Mobility (physical);ADLs (physical)     Mobility Comments: self propels WC to dining room with use of B LEs; history of falls,  transfers with assistance only ADLs Comments: assistance ADL's     Extremity/Trunk Assessment   Upper Extremity Assessment Upper Extremity Assessment:  Generalized weakness    Lower Extremity Assessment Lower Extremity Assessment: Generalized weakness    Cervical / Trunk Assessment Cervical / Trunk Assessment: Kyphotic  Communication   Communication Communication: Impaired Factors Affecting Communication: Hearing impaired    Cognition Arousal: Alert Behavior During Therapy: WFL for tasks assessed/performed   PT - Cognitive impairments: No apparent impairments                       PT - Cognition Comments: oriented x 3, follows directions with increased time with HOH. Following commands: Impaired       Cueing Cueing Techniques: Verbal cues, Gestural cues, Visual cues (reads lips better)     General Comments      Exercises     Assessment/Plan    PT Assessment Patient needs continued PT services  PT Problem List Decreased strength;Decreased range of motion;Decreased activity tolerance;Decreased balance;Decreased mobility;Cardiopulmonary status limiting activity       PT Treatment Interventions DME instruction;Gait training;Functional mobility training;Therapeutic activities;Therapeutic exercise;Patient/family education    PT Goals (Current goals can be found in the Care Plan section)  Acute Rehab PT Goals Patient Stated Goal: to return to [facility], walk PT Goal Formulation: With patient/family Time For Goal Achievement: 04/14/24 Potential to Achieve Goals: Fair    Frequency Min 3X/week     Co-evaluation               AM-PAC PT 6 Clicks Mobility  Outcome Measure Help needed turning from your back to your side while in a flat bed without using bedrails?: A Lot Help needed moving from lying on your back to sitting on the side of a flat bed without using bedrails?: A Lot Help needed moving to and from a bed to a chair (including a wheelchair)?: A Lot Help needed standing up from a chair using your arms (e.g., wheelchair or bedside chair)?: A Lot Help needed to walk in hospital room?:  Total Help needed climbing 3-5 steps with a railing? : Total 6 Click Score: 10    End of Session Equipment Utilized During Treatment: Gait belt Activity Tolerance: Patient tolerated treatment well Patient left: in chair;with call bell/phone within reach;with chair alarm set Nurse Communication: Mobility status PT Visit Diagnosis: Other abnormalities of gait and mobility (R26.89);Repeated falls (R29.6);History of falling (Z91.81)    Time: 8765-8684 PT Time Calculation (min) (ACUTE ONLY): 41 min   Charges:   PT Evaluation $PT Eval Low Complexity: 1 Low PT Treatments $Therapeutic Activity: 23-37 mins PT General Charges $$ ACUTE PT VISIT: 1 Visit         {Hazem Kenner [facility] PT Acute Rehabilitation Services Office [phone]  Leigh Darice Norris 03/31/2024, 2:15 PM

## 2024-03-31 NOTE — Plan of Care (Signed)

## 2024-03-31 NOTE — Progress Notes (Signed)
 PROGRESS NOTE    Lauren Davidson  FMW:[id number] DOB: [birth year] DOA: 03/30/2024 PCP: Dwight Trula SQUIBB, MD   Brief Narrative:  Lauren Davidson is a 89 y.o. female with medical history significant of chronic HFpEF, valvular heart disease including AS, MR, HLD, lumbar compression fracture, Sjogren syndrome, sent from nursing home for evaluation of hypoxia.    Assessment & Plan:   Principal Problem:   CHF (congestive heart failure) (HCC) Active Problems:   Acute CHF (congestive heart failure) (HCC)  Goals of care - Lengthy discussion today at bedside with patient and family, she is apparently being evaluated by [facility] and family is requesting that her team be notified.  We discussed that it certainly reasonable to have [facility] notified to help weigh in on her current situation. - Patient remains DNR - daughter/son-in-law at bedside are primary decision makers/caretakers  Acute hypoxic respiratory failure Acute on chronic HFpEF decompensation - Patient has both symptoms and signs of fluid overload, plan to continue IV diuresis Lasix 20 mg twice daily - Echocardiogram was done within 2 months, will not repeat echo at this time. - Unlikely pneumonia given onset and exam, continue to follow clinically   HTN - Controlled, on IV Lasix.  Acute on chronic ambulatory dysfunction -continue PT OT evaluation and treatment, currently undergoing PT at facility -reportedly 2+ assist   Sjogren syndrome - Chronic tremors, educated the patient regarding fluid restriction for Hx of CHF.  DVT prophylaxis: Place and maintain sequential compression device Start: 03/31/24 0925 Code Status:   Code Status: Limited: Do not attempt resuscitation (DNR) -DNR-LIMITED -Do Not Intubate/DNI  Family Communication: Daughter and son-in-law at bedside  Status is: Inpatient  Dispo: The patient is from: Facility              Anticipated d/c is to: Same              Anticipated d/c date is: 24 to 48 hours               Patient currently not medically stable for discharge  Consultants:  None  Procedures:  None  Antimicrobials:  None  Subjective: No acute issues or events overnight  Objective: Vitals:   03/30/24 2257 03/31/24 0515 03/31/24 0600 03/31/24 0734  BP: (!) 128/59 (!) 144/78  139/63  Pulse: 66 73  66  Resp: 16 17    Temp: 98.5 F (36.9 C) 98.7 F (37.1 C)  98.3 F (36.8 C)  TempSrc: Oral   Oral  SpO2: 97% 95%  97%  Weight:   58 kg   Height:        Intake/Output Summary (Last 24 hours) at 03/31/2024 1123 Last data filed at 03/31/2024 1020 Gross per 24 hour  Intake 230 ml  Output 650 ml  Net -420 ml   Filed Weights   03/31/24 0600  Weight: 58 kg    Examination:  General: Elderly female, cachectic in appearance resting comfortably in bed in no acute distress . HEENT:  Normocephalic atraumatic.  Sclerae nonicteric, noninjected.  Extraocular movements intact bilaterally. Neck:  Without mass or deformity.  Trachea is midline. Lungs: Scant bilateral rales. Heart:  Regular rate and rhythm.  Without murmurs, rubs, or gallops. Abdomen:  Soft, nontender, nondistended.  Without guarding or rebound. Extremities: Without cyanosis, clubbing, overt edema. Skin:  Warm and dry, no erythema.  Scars consistent with surgical history  Data Reviewed: I have personally reviewed following labs and imaging studies  CBC: Recent Labs  Lab 03/30/24 [phone]  WBC 5.2  NEUTROABS 3.2  HGB 12.3  HCT 36.8  MCV 100.8*  PLT 171   Basic Metabolic Panel: Recent Labs  Lab 03/30/24 0651 03/31/24 0403  NA 140 136  K 4.2 4.2  CL 104 100  CO2 26 28  GLUCOSE 85 79  BUN 21 20  CREATININE 0.71 0.73  CALCIUM  9.3 8.9   GFR: Estimated Creatinine Clearance: 32.3 mL/min (by C-G formula based on SCr of 0.73 mg/dL).  BNP (last 3 results) Recent Labs    02/17/24 0921 03/30/24 0651  PROBNP 976.0* 1,287.0*   Sepsis Labs: Recent Labs  Lab 03/30/24 9178  LATICACIDVEN 0.5     Recent Results (from the past 240 hours)  Culture, blood (routine x 2)     Status: None (Preliminary result)   Collection Time: 03/30/24  8:08 AM   Specimen: BLOOD RIGHT ARM  Result Value Ref Range Status   Specimen Description   Final    BLOOD RIGHT ARM Performed at [facility], [address] [place], [place] [address]    Special Requests   Final    BOTTLES DRAWN AEROBIC AND ANAEROBIC Blood Culture adequate volume Performed at [facility], [address] [place], [place] [address]    Culture   Final    NO GROWTH < 24 HOURS Performed at [facility], [address], [place], [place] [address]    Report Status PENDING  Incomplete  Culture, blood (routine x 2)     Status: None (Preliminary result)   Collection Time: 03/30/24 10:41 AM   Specimen: BLOOD LEFT ARM  Result Value Ref Range Status   Specimen Description   Final    BLOOD LEFT ARM Performed at [facility], [address] [place], [place] [address]    Special Requests   Final    BOTTLES DRAWN AEROBIC AND ANAEROBIC Blood Culture results may not be optimal due to an inadequate volume of blood received in culture bottles Performed at [facility], [address], [place], [place] [address]    Culture   Final    NO GROWTH < 24 HOURS Performed at [facility], [address], [place], [place] [address]    Report Status PENDING  Incomplete  MRSA Next Gen by PCR, Nasal     Status: Abnormal   Collection Time: 03/30/24  1:01 PM   Specimen: Nasal Mucosa; Nasal Swab  Result Value Ref Range Status   MRSA by PCR Next Gen DETECTED (A) NOT DETECTED Final    Comment: (NOTE) The GeneXpert MRSA Assay (FDA approved for NASAL specimens only), is one component of a comprehensive MRSA colonization surveillance program. It is not intended to diagnose MRSA infection nor to guide or monitor treatment for MRSA infections. Test performance is not FDA approved in  patients less than 16 years old. Performed at [facility], [address] [place], [place] [address]          Radiology Studies: DG CHEST PORT 1 VIEW Result Date: 03/31/2024 CLINICAL DATA:  CHF. EXAM: PORTABLE CHEST 1 VIEW COMPARISON:  03/30/2024, 02/17/2024 and 06/20/2023, chest CT 03/30/2024 FINDINGS: Lungs are adequately inflated without lobar consolidation or effusion. Mild stable hazy prominence of the central pulmonary vasculature suggesting mild degree of vascular congestion. Minimal chronic interstitial prominence over the upper lungs. Borderline stable cardiomegaly. Remainder of the exam is unchanged. IMPRESSION: Borderline stable cardiomegaly with suggestion of mild vascular congestion. Electronically Signed  By: Toribio Agreste M.D.   On: 03/31/2024 07:52   CT CHEST WO CONTRAST Result Date: 03/30/2024 EXAM: CT CHEST WITHOUT CONTRAST 03/30/2024 08:06:34 AM TECHNIQUE: CT of the chest was performed without the administration of intravenous contrast. Multiplanar reformatted images are provided for review. Automated exposure control, iterative reconstruction, and/or weight based adjustment of the mA/kV was utilized to reduce the radiation dose to as low as reasonably achievable. COMPARISON: None available. CLINICAL HISTORY: Respiratory illness, nondiagnostic xray. FINDINGS: MEDIASTINUM: Heart: Moderate cardiomegaly. Dense multivessel coronary atherosclerosis. Central airways: Right tracheal diverticulum measuring 1.1 cm. The central airways are otherwise clear. Esophagus: Ingested material within the mid and upper esophagus. Vessels: Extensive aortic atherosclerosis. LYMPH NODES: No mediastinal, hilar or axillary lymphadenopathy. LUNGS AND PLEURA: Biapical pleuroparenchymal scarring, unchanged. Redemonstrated mosaic attenuation throughout the lungs, with an upper lobe predominance, likely reflecting changes of chronic small vessel disease. Multifocal subsegmental  atelectasis in the lower lobes. Unchanged branching and calcification noted in the right lower lobe likely related to prior infection or aspiration. Trace right and small left pleural effusions, overall decreased in size in the interim. No pneumothorax. SOFT TISSUES/BONES: No acute abnormality of the bones or soft tissues. Diffuse osteopenia. Chronic, healed fracture deformity of the upper sternal body. Unchanged chronic multilevel thoracic compression fractures with multilevel vertebroplasty with cement at the thoracolumbar junction. UPPER ABDOMEN: Limited images of the upper abdomen demonstrates no acute abnormality. IMPRESSION: 1. Mosaic attenuation throughout the lungs, with an upper lobe predominance, likely reflecting changes of chronic small vessel disease. Mild pulmonary edema could also have this appearance in the correct clinical context. 2. Trace right and small left pleural effusions, overall decreased in size in the interim. 3. Ingested material in the mid and upper esophagus, likely due to incompletely emptied or gastroesophageal reflux disease. Electronically signed by: Rogelia Myers MD 03/30/2024 08:57 AM EST RP Workstation: [id number]   DG Chest Port 1 View Result Date: 03/30/2024 CLINICAL DATA:  Shortness of breath. EXAM: PORTABLE CHEST 1 VIEW COMPARISON:  02/17/2024 FINDINGS: The cardio pericardial silhouette is enlarged. Interstitial markings are diffusely coarsened with chronic features. Interstitial pulmonary edema pattern seen previously has decreased in the interval. There is persistent left base atelectasis or infiltrate. Possible tiny bilateral effusions. Bones are diffusely demineralized. Telemetry leads overlie the chest. IMPRESSION: 1. Interval decrease in interstitial pulmonary edema pattern. 2. Persistent left base atelectasis or infiltrate. Electronically Signed   By: Camellia Candle M.D.   On: 03/30/2024 07:16        Scheduled Meds:  amLODipine   2.5 mg Oral Daily    Chlorhexidine  Gluconate Cloth  6 each Topical Daily   docusate sodium   100 mg Oral BID   furosemide  20 mg Intravenous BID   guaiFENesin  1,200 mg Oral BID   lidocaine   1 patch Transdermal Q24H   mupirocin  ointment  1 Application Nasal BID   [START ON 04/01/2024] pantoprazole   40 mg Oral Q0600   sodium chloride  flush  3 mL Intravenous Q12H   Continuous Infusions:     LOS: 0 days   Time spent:  Elsie JAYSON Mont[place], DO [facility]  If 7PM-7AM, please contact night-coverage [email or web address]  03/31/2024, 11:23 AM

## 2024-03-31 NOTE — Plan of Care (Signed)
  Problem: Clinical Measurements: Goal: Diagnostic test results will improve Outcome: Progressing   Problem: Clinical Measurements: Goal: Respiratory complications will improve Outcome: Progressing   Problem: Clinical Measurements: Goal: Cardiovascular complication will be avoided Outcome: Progressing   Problem: Nutrition: Goal: Adequate nutrition will be maintained Outcome: Progressing   

## 2024-04-01 DIAGNOSIS — I5031 Acute diastolic (congestive) heart failure: Secondary | ICD-10-CM | POA: Diagnosis not present

## 2024-04-01 LAB — BASIC METABOLIC PANEL WITH GFR
Anion gap: 10 (ref 5–15)
BUN: 29 mg/dL — ABNORMAL HIGH (ref 8–23)
CO2: 29 mmol/L (ref 22–32)
Calcium: 9.3 mg/dL (ref 8.9–10.3)
Chloride: 97 mmol/L — ABNORMAL LOW (ref 98–111)
Creatinine, Ser: 0.82 mg/dL (ref 0.44–1.00)
GFR, Estimated: >60 mL/min (ref >60)
Glucose, Bld: 76 mg/dL (ref 70–99)
Potassium: 4 mmol/L (ref 3.5–5.1)
Sodium: 135 mmol/L (ref 135–145)

## 2024-04-01 NOTE — Discharge Summary (Signed)
 Physician Discharge Summary  Lauren Davidson FMW:[id number] DOB: 1922/04/11 DOA: 03/30/2024  PCP: Dwight Trula SQUIBB, MD  Admit date: 03/30/2024 Discharge date: 04/02/2024  Admitted From: ALF Disposition:  Same  Recommendations for Outpatient Follow-up:  Follow-up with [facility] palliative care as scheduled  Home Health: PT/OT, resume Equipment/Devices: None  Discharge Condition: Stable CODE STATUS: DNR Diet recommendation: As tolerated  Brief/Interim Summary: Lauren Davidson is a 89 y.o. female with medical history significant of chronic HFpEF, valvular heart disease including AS, MR, HLD, lumbar compression fracture, Sjogren syndrome, sent from nursing home for evaluation of hypoxia.  Patient presents to our facility with worsening respiratory failure and acute hypoxia secondary to heart failure exacerbation.  She diuresed quite well, after lengthy discussion with family as below with goals of care as our primary directives she is going to be followed with [facility] in the outpatient setting at her assisted living.  At this time she does not require oxygen at rest or with exertion.  She continues to complain of dyspnea with exertion which is not unreasonable given her age and comorbid conditions.  At this time she is stable for discharge back to her prior facility, discussed at length goals of care and if patient would want to be brought back to the hospital should she continue to have episodes or decline, defer to [facility] for ongoing discussion in regards to her palliative care treatment.  Discharge Diagnoses:  Principal Problem:   CHF (congestive heart failure) (HCC) Active Problems:   Acute CHF (congestive heart failure) (HCC)   Goals of care - Follow-up outpatient with [facility]. - Patient remains DNR - daughter/son-in-law at bedside are primary decision makers/caretakers   Acute hypoxic respiratory failure Acute on chronic HFpEF decompensation - Patient has both  symptoms and signs of fluid overload, plan to continue IV diuresis Lasix 20 mg twice daily - Echocardiogram was done within 2 months, will not repeat echo at this time. - Unlikely pneumonia given onset and exam, continue to follow clinically   HTN - Controlled, on IV Lasix.   Acute on chronic ambulatory dysfunction -continue current PT at facility -reportedly 2+ assist   Sjogren syndrome - Chronic, educated the patient regarding fluid restriction for Hx of CHF.  Discharge Instructions  Discharge Instructions     Call MD for:  difficulty breathing, headache or visual disturbances   Complete by: As directed    Call MD for:  extreme fatigue   Complete by: As directed    Call MD for:  hives   Complete by: As directed    Call MD for:  persistant dizziness or light-headedness   Complete by: As directed    Call MD for:  persistant nausea and vomiting   Complete by: As directed    Call MD for:  severe uncontrolled pain   Complete by: As directed    Call MD for:  temperature >100.4   Complete by: As directed    Diet general   Complete by: As directed    Discharge instructions   Complete by: As directed    Follow up with [facility] as scheduled   Increase activity slowly   Complete by: As directed    No wound care   Complete by: As directed       Allergies as of 04/02/2024       Reactions   Statins Other (See Comments)   Muscle weakness and pain   Buprenorphine Hcl Other (See Comments)   Patient questioned this entry. Allergie not  listed on MAR    Buspirone Other (See Comments)   Weakness   Lexapro  [escitalopram ] Other (See Comments)   Muscle problems   Meperidine Other (See Comments)   Demerol- questionable allergy- reaction not recalled   Morphine And Codeine Rash        Medication List     STOP taking these medications    amLODipine  5 MG tablet Commonly known as: NORVASC    oxyCODONE  5 MG immediate release tablet Commonly known as: Oxy IR/ROXICODONE         TAKE these medications    acetaminophen  500 MG tablet Commonly known as: TYLENOL  Take 1,000 mg by mouth every 6 (six) hours as needed for mild pain (pain score 1-3) or moderate pain (pain score 4-6).   CALCIUM +D3 PO Take 1 tablet by mouth daily.   docusate sodium  100 MG capsule Commonly known as: COLACE Take 1 capsule (100 mg total) by mouth 2 (two) times daily.   famotidine 20 MG tablet Commonly known as: PEPCID Take 20 mg by mouth daily as needed (gerd).   ferrous gluconate  324 MG tablet Commonly known as: FERGON Take 324 mg by mouth daily with breakfast.   furosemide 20 MG tablet Commonly known as: LASIX Take 1 tablet (20 mg total) by mouth daily.   GenTeal Tears 0.1-0.3 % Soln Generic drug: Dextran 70-Hypromellose Place 1 drop into both eyes every 12 (twelve) hours as needed (dry eyes).   ibuprofen  200 MG tablet Commonly known as: ADVIL  Take 200 mg by mouth every 6 (six) hours as needed for mild pain (pain score 1-3) or moderate pain (pain score 4-6).   lidocaine  4 % Place 1 patch onto the skin daily. Apply to lower back   pantoprazole  40 MG tablet Commonly known as: PROTONIX  Take 40 mg by mouth every morning.   PreserVision AREDS 2 Caps Take 1 capsule by mouth in the morning and at bedtime.   Vitamin D  50 MCG (2000 UT) Caps Take 2,000 Units by mouth daily.        Contact information for after-discharge care     Home Medical Care     [facility]) .   Service: Home Health Services Contact information: [phone] [address] [place] [address] [place]  [phone]                    Allergies  Allergen Reactions   Statins Other (See Comments)    Muscle weakness and pain   Buprenorphine Hcl Other (See Comments)    Patient questioned this entry. Allergie not listed on MAR    Buspirone Other (See Comments)    Weakness    Lexapro  [Escitalopram ] Other (See Comments)    Muscle problems   Meperidine Other  (See Comments)    Demerol- questionable allergy- reaction not recalled   Morphine And Codeine Rash    Consultations: None  Procedures/Studies: DG CHEST PORT 1 VIEW Result Date: 03/31/2024 CLINICAL DATA:  CHF. EXAM: PORTABLE CHEST 1 VIEW COMPARISON:  03/30/2024, 02/17/2024 and 06/20/2023, chest CT 03/30/2024 FINDINGS: Lungs are adequately inflated without lobar consolidation or effusion. Mild stable hazy prominence of the central pulmonary vasculature suggesting mild degree of vascular congestion. Minimal chronic interstitial prominence over the upper lungs. Borderline stable cardiomegaly. Remainder of the exam is unchanged. IMPRESSION: Borderline stable cardiomegaly with suggestion of mild vascular congestion. Electronically Signed   By: Toribio Agreste M.D.   On: 03/31/2024 07:52   CT CHEST WO CONTRAST Result Date: 03/30/2024 EXAM: CT  CHEST WITHOUT CONTRAST 03/30/2024 08:06:34 AM TECHNIQUE: CT of the chest was performed without the administration of intravenous contrast. Multiplanar reformatted images are provided for review. Automated exposure control, iterative reconstruction, and/or weight based adjustment of the mA/kV was utilized to reduce the radiation dose to as low as reasonably achievable. COMPARISON: None available. CLINICAL HISTORY: Respiratory illness, nondiagnostic xray. FINDINGS: MEDIASTINUM: Heart: Moderate cardiomegaly. Dense multivessel coronary atherosclerosis. Central airways: Right tracheal diverticulum measuring 1.1 cm. The central airways are otherwise clear. Esophagus: Ingested material within the mid and upper esophagus. Vessels: Extensive aortic atherosclerosis. LYMPH NODES: No mediastinal, hilar or axillary lymphadenopathy. LUNGS AND PLEURA: Biapical pleuroparenchymal scarring, unchanged. Redemonstrated mosaic attenuation throughout the lungs, with an upper lobe predominance, likely reflecting changes of chronic small vessel disease. Multifocal subsegmental atelectasis in the  lower lobes. Unchanged branching and calcification noted in the right lower lobe likely related to prior infection or aspiration. Trace right and small left pleural effusions, overall decreased in size in the interim. No pneumothorax. SOFT TISSUES/BONES: No acute abnormality of the bones or soft tissues. Diffuse osteopenia. Chronic, healed fracture deformity of the upper sternal body. Unchanged chronic multilevel thoracic compression fractures with multilevel vertebroplasty with cement at the thoracolumbar junction. UPPER ABDOMEN: Limited images of the upper abdomen demonstrates no acute abnormality. IMPRESSION: 1. Mosaic attenuation throughout the lungs, with an upper lobe predominance, likely reflecting changes of chronic small vessel disease. Mild pulmonary edema could also have this appearance in the correct clinical context. 2. Trace right and small left pleural effusions, overall decreased in size in the interim. 3. Ingested material in the mid and upper esophagus, likely due to incompletely emptied or gastroesophageal reflux disease. Electronically signed by: Rogelia Myers MD 03/30/2024 08:57 AM EST RP Workstation: [id number]   DG Chest Port 1 View Result Date: 03/30/2024 CLINICAL DATA:  Shortness of breath. EXAM: PORTABLE CHEST 1 VIEW COMPARISON:  02/17/2024 FINDINGS: The cardio pericardial silhouette is enlarged. Interstitial markings are diffusely coarsened with chronic features. Interstitial pulmonary edema pattern seen previously has decreased in the interval. There is persistent left base atelectasis or infiltrate. Possible tiny bilateral effusions. Bones are diffusely demineralized. Telemetry leads overlie the chest. IMPRESSION: 1. Interval decrease in interstitial pulmonary edema pattern. 2. Persistent left base atelectasis or infiltrate. Electronically Signed   By: Camellia Candle M.D.   On: 03/30/2024 07:16     Subjective: No acute issues or events overnight denies nausea vomiting diarrhea  constipation headache fevers chills or chest pain, continues to complain of dyspnea with exertion and general shortness of breath but again is not hypoxic.   Discharge Exam: Vitals:   04/01/24 2040 04/02/24 0421  BP: (!) 114/56 (!) 131/59  Pulse: 67 70  Resp: (!) 24 20  Temp: 98.4 F (36.9 C) 98 F (36.7 C)  SpO2: 94% 96%   Vitals:   04/01/24 1320 04/01/24 2040 04/02/24 0421 04/02/24 0500  BP: 118/60 (!) 114/56 (!) 131/59   Pulse: 66 67 70   Resp: 18 (!) 24 20   Temp: 97.7 F (36.5 C) 98.4 F (36.9 C) 98 F (36.7 C)   TempSrc: Oral Oral Oral   SpO2: 98% 94% 96%   Weight:    50.4 kg  Height:        General: Pt is alert, awake, not in acute distress Cardiovascular: RRR, S1/S2 +, no rubs, no gallops Respiratory: CTA bilaterally, no wheezing, no rhonchi Abdominal: Soft, NT, ND, bowel sounds + Extremities: no edema, no cyanosis    The results of significant  diagnostics from this hospitalization (including imaging, microbiology, ancillary and laboratory) are listed below for reference.     Microbiology: Recent Results (from the past 240 hours)  Culture, blood (routine x 2)     Status: None (Preliminary result)   Collection Time: 03/30/24  8:08 AM   Specimen: BLOOD RIGHT ARM  Result Value Ref Range Status   Specimen Description   Final    BLOOD RIGHT ARM Performed at [facility], [address], [place], [place] [address]    Special Requests   Final    BOTTLES DRAWN AEROBIC AND ANAEROBIC Blood Culture adequate volume Performed at [facility], [address] [place], [place] [address]    Culture   Final    NO GROWTH 3 DAYS Performed at [facility], [address] [place], [place] [address]    Report Status PENDING  Incomplete  Culture, blood (routine x 2)     Status: None (Preliminary result)   Collection Time: 03/30/24 10:41 AM   Specimen: BLOOD LEFT ARM  Result Value Ref Range Status   Specimen Description   Final    BLOOD  LEFT ARM Performed at [facility], [address], [place], [place] [address]    Special Requests   Final    BOTTLES DRAWN AEROBIC AND ANAEROBIC Blood Culture results may not be optimal due to an inadequate volume of blood received in culture bottles Performed at [facility], [address], [place], [place] [address]    Culture   Final    NO GROWTH 3 DAYS Performed at [facility], [address] [place], [place] [address]    Report Status PENDING  Incomplete  MRSA Next Gen by PCR, Nasal     Status: Abnormal   Collection Time: 03/30/24  1:01 PM   Specimen: Nasal Mucosa; Nasal Swab  Result Value Ref Range Status   MRSA by PCR Next Gen DETECTED (A) NOT DETECTED Final    Comment: (NOTE) The GeneXpert MRSA Assay (FDA approved for NASAL specimens only), is one component of a comprehensive MRSA colonization surveillance program. It is not intended to diagnose MRSA infection nor to guide or monitor treatment for MRSA infections. Test performance is not FDA approved in patients less than 52 years old. Performed at [facility], [address], [place], [place] [address]      Labs: BNP (last 3 results) No results for input(s): BNP in the last 8760 hours. Basic Metabolic Panel: Recent Labs  Lab 03/30/24 0651 03/31/24 0403 04/01/24 0407  NA 140 136 135  K 4.2 4.2 4.0  CL 104 100 97*  CO2 26 28 29   GLUCOSE 85 79 76  BUN 21 20 29*  CREATININE 0.71 0.73 0.82  CALCIUM  9.3 8.9 9.3   Liver Function Tests: No results for input(s): AST, ALT, ALKPHOS, BILITOT, PROT, ALBUMIN  in the last 168 hours. No results for input(s): LIPASE, AMYLASE in the last 168 hours. No results for input(s): AMMONIA in the last 168 hours. CBC: Recent Labs  Lab 03/30/24 0651  WBC 5.2  NEUTROABS 3.2  HGB 12.3  HCT 36.8  MCV 100.8*  PLT 171   Cardiac Enzymes: No results for input(s): CKTOTAL, CKMB, CKMBINDEX, TROPONINI in  the last 168 hours. BNP: Invalid input(s): POCBNP CBG: No results for input(s): GLUCAP in the last 168 hours. D-Dimer No results for input(s): DDIMER in the last 72 hours. Hgb A1c No results for input(s):  HGBA1C in the last 72 hours. Lipid Profile No results for input(s): CHOL, HDL, LDLCALC, TRIG, CHOLHDL, LDLDIRECT in the last 72 hours. Thyroid  function studies No results for input(s): TSH, T4TOTAL, T3FREE, THYROIDAB in the last 72 hours.  Invalid input(s): FREET3 Anemia work up No results for input(s): VITAMINB12, FOLATE, FERRITIN, TIBC, IRON , RETICCTPCT in the last 72 hours. Urinalysis    Component Value Date/Time   COLORURINE YELLOW 10/16/2023 1614   APPEARANCEUR CLOUDY (A) 10/16/2023 1614   LABSPEC 1.011 10/16/2023 1614   PHURINE 8.0 10/16/2023 1614   GLUCOSEU NEGATIVE 10/16/2023 1614   HGBUR NEGATIVE 10/16/2023 1614   BILIRUBINUR NEGATIVE 10/16/2023 1614   KETONESUR NEGATIVE 10/16/2023 1614   PROTEINUR NEGATIVE 10/16/2023 1614   NITRITE NEGATIVE 10/16/2023 1614   LEUKOCYTESUR LARGE (A) 10/16/2023 1614   Sepsis Labs Recent Labs  Lab 03/30/24 0651  WBC 5.2   Microbiology Recent Results (from the past 240 hours)  Culture, blood (routine x 2)     Status: None (Preliminary result)   Collection Time: 03/30/24  8:08 AM   Specimen: BLOOD RIGHT ARM  Result Value Ref Range Status   Specimen Description   Final    BLOOD RIGHT ARM Performed at [facility], [address], [place], [place] [address]    Special Requests   Final    BOTTLES DRAWN AEROBIC AND ANAEROBIC Blood Culture adequate volume Performed at [facility], [address] [place], [place] [address]    Culture   Final    NO GROWTH 3 DAYS Performed at [facility], [address] [place], [place] [address]    Report Status PENDING  Incomplete  Culture, blood (routine x 2)     Status: None (Preliminary result)   Collection Time:  03/30/24 10:41 AM   Specimen: BLOOD LEFT ARM  Result Value Ref Range Status   Specimen Description   Final    BLOOD LEFT ARM Performed at [facility], [address] [place], [place] [address]    Special Requests   Final    BOTTLES DRAWN AEROBIC AND ANAEROBIC Blood Culture results may not be optimal due to an inadequate volume of blood received in culture bottles Performed at [facility], [address] [place], [place] [address]    Culture   Final    NO GROWTH 3 DAYS Performed at [facility], [address], [place], [place] [address]    Report Status PENDING  Incomplete  MRSA Next Gen by PCR, Nasal     Status: Abnormal   Collection Time: 03/30/24  1:01 PM   Specimen: Nasal Mucosa; Nasal Swab  Result Value Ref Range Status   MRSA by PCR Next Gen DETECTED (A) NOT DETECTED Final    Comment: (NOTE) The GeneXpert MRSA Assay (FDA approved for NASAL specimens only), is one component of a comprehensive MRSA colonization surveillance program. It is not intended to diagnose MRSA infection nor to guide or monitor treatment for MRSA infections. Test performance is not FDA approved in patients less than 23 years old. Performed at [facility], [address], [place], [place] [address]      Time coordinating discharge: Over 30 minutes  SIGNED:   Elsie JAYSON [place], DO [facility] 04/02/2024, 9:35 AM Pager   If 7PM-7AM, please contact night-coverage [email or web address]

## 2024-04-01 NOTE — Progress Notes (Signed)
 Mobility Specialist - Progress Note   04/01/24 1404  Mobility  Activity Ambulated with assistance;Pivoted/transferred from chair to bed  Level of Assistance +2 (takes two people)  Press Photographer wheel walker  Distance Ambulated (ft) 3 ft  Range of Motion/Exercises Active  Activity Response Tolerated well  Mobility visit 1 Mobility  Mobility Specialist Start Time (ACUTE ONLY) 1350  Mobility Specialist Stop Time (ACUTE ONLY) 1402  Mobility Specialist Time Calculation (min) (ACUTE ONLY) 12 min   Pt was found on recliner chair and wanting to get back in bed. Assisted and stated wanting to perform UE exercises. Provided yellow thera band and demonstrated Supine BUE Exercises: 10 reps each,  1) Elbow Flexion  2) Elbow extension   3) Horizontal Abduction  Was left with all needs met. Call bell in reach.   Erminio Leos,  Mobility Specialist Can be reached via Secure Chat

## 2024-04-01 NOTE — Progress Notes (Signed)
 Mobility Specialist - Progress Note   04/01/24 0940  Mobility  Activity Pivoted/transferred from bed to chair  Level of Assistance +2 (takes two people)  Assistive Device Other (Comment) (HHA)  Distance Ambulated (ft) 3 ft  Range of Motion/Exercises Active  Activity Response Tolerated well  Mobility visit 1 Mobility  Mobility Specialist Start Time (ACUTE ONLY) 0930  Mobility Specialist Stop Time (ACUTE ONLY) 0940  Mobility Specialist Time Calculation (min) (ACUTE ONLY) 10 min   Pt was found in bed and agreeable to mobilize. NT assisted with transfer to recliner chair. At EOS was left on recliner chair with all needs met. Call bell in reach and chair alarm on. NT in room.   Erminio Leos,  Mobility Specialist Can be reached via Secure Chat

## 2024-04-01 NOTE — Progress Notes (Signed)
 Heart Failure Navigator Progress Note  Assessed for Heart & Vascular TOC clinic readiness.  Patient does not meet criteria due to EF 60-65%, per MD patient to see her PCP with in 1-2 weeks. No HF TOC. .   Navigator will sign off at this time.   Stephane Haddock, BSN, Scientist, Clinical (histocompatibility And Immunogenetics) Only

## 2024-04-01 NOTE — TOC Progression Note (Signed)
 Transition of Care [facility]) - Progression Note   Patient Details  Name: Lauren Davidson MRN: [id number] Date of Birth: 1921/12/23  [facility] CM/SW Contact  Duwaine GORMAN [name], LCSW Phone Number: 04/01/2024, 3:51 PM  Clinical Narrative: PT evaluation recommended HHPT. CSW followed up with [facility] and confirmed the patient was already active for HHPT/OT through [facility]. CSW made [facility] referral to [place] in hub, which was accepted. CSW spoke with son regarding home oxygen and he reported no DME company preference. CSW made referral to [facility] with [employer] for home oxygen. Care management awaiting oxygen orders/saturation note and HHPT/OT orders. Care management to follow.  Expected Discharge Plan: Assisted Living Barriers to Discharge: Continued Medical Work up  Expected Discharge Plan and Services In-house Referral: Clinical Social Work Living arrangements for the past 2 months: Assisted Living Facility             DME Arranged: N/A DME Agency: NA  Social Drivers of Health (SDOH) Interventions SDOH Screenings   Food Insecurity: No Food Insecurity (03/30/2024)  Housing: Low Risk  (03/30/2024)  Transportation Needs: No Transportation Needs (03/30/2024)  Utilities: Not At Risk (03/30/2024)  Social Connections: Moderately Integrated (03/30/2024)  Tobacco Use: Low Risk  (03/30/2024)   Readmission Risk Interventions     No data to display

## 2024-04-01 NOTE — Plan of Care (Signed)

## 2024-04-01 NOTE — Progress Notes (Signed)
 PROGRESS NOTE    Lauren Davidson  FMW:[id number] DOB: 1921-08-10 DOA: 03/30/2024 PCP: Dwight Trula SQUIBB, MD   Brief Narrative:  Lauren Davidson is a 89 y.o. female with medical history significant of chronic HFpEF, valvular heart disease including AS, MR, HLD, lumbar compression fracture, Sjogren syndrome, sent from nursing home for evaluation of hypoxia.    Assessment & Plan:   Principal Problem:   CHF (congestive heart failure) (HCC) Active Problems:   Acute CHF (congestive heart failure) (HCC)  Goals of care - Lengthy discussion today at bedside with patient and family, she is apparently being evaluated by [facility] and family is requesting that her team be notified.  We discussed that it certainly reasonable to have [facility] notified to help weigh in on her current situation. - Patient remains DNR - daughter/son-in-law at bedside are primary decision makers/caretakers  Acute hypoxic respiratory failure Acute on chronic HFpEF decompensation - Patient has both symptoms and signs of fluid overload, plan to continue IV diuresis Lasix 20 mg twice daily - Echocardiogram was done within 2 months, will not repeat echo at this time. - Unlikely pneumonia given onset and exam, continue to follow clinically   HTN - Controlled, on IV Lasix.  Acute on chronic ambulatory dysfunction -continue PT OT evaluation and treatment, currently undergoing PT at facility -reportedly 2+ assist   Sjogren syndrome - Chronic tremors, educated the patient regarding fluid restriction for Hx of CHF.  DVT prophylaxis: Place and maintain sequential compression device Start: 03/31/24 0925 Code Status:   Code Status: Limited: Do not attempt resuscitation (DNR) -DNR-LIMITED -Do Not Intubate/DNI  Family Communication: Daughter and son-in-law at bedside  Status is: Inpatient  Dispo: The patient is from: Facility              Anticipated d/c is to: Same              Anticipated d/c date is: 24 to 48 hours               Patient currently not medically stable for discharge  Consultants:  None  Procedures:  None  Antimicrobials:  None  Subjective: No acute issues or events overnight  Objective: Vitals:   03/31/24 2003 04/01/24 0500 04/01/24 0628 04/01/24 1320  BP: (!) 123/57  (!) 110/53 118/60  Pulse: 66  78 66  Resp: 17  18 18   Temp: 98.1 F (36.7 C)  98 F (36.7 C) 97.7 F (36.5 C)  TempSrc:   Oral Oral  SpO2: 95%  95% 98%  Weight:  50.8 kg    Height:        Intake/Output Summary (Last 24 hours) at 04/01/2024 1606 Last data filed at 04/01/2024 1004 Gross per 24 hour  Intake 490 ml  Output 800 ml  Net -310 ml   Filed Weights   03/31/24 0600 04/01/24 0500  Weight: 58 kg 50.8 kg    Examination:  General: Elderly female, cachectic in appearance resting comfortably in bed in no acute distress . HEENT:  Normocephalic atraumatic.  Sclerae nonicteric, noninjected.  Extraocular movements intact bilaterally. Neck:  Without mass or deformity.  Trachea is midline. Lungs: Scant bilateral rales. Heart:  Regular rate and rhythm.  Without murmurs, rubs, or gallops. Abdomen:  Soft, nontender, nondistended.  Without guarding or rebound. Extremities: Without cyanosis, clubbing, overt edema. Skin:  Warm and dry, no erythema.  Scars consistent with surgical history  Data Reviewed: I have personally reviewed following labs and imaging studies  CBC: Recent Labs  Lab 03/30/24 0651  WBC 5.2  NEUTROABS 3.2  HGB 12.3  HCT 36.8  MCV 100.8*  PLT 171   Basic Metabolic Panel: Recent Labs  Lab 03/30/24 0651 03/31/24 0403 04/01/24 0407  NA 140 136 135  K 4.2 4.2 4.0  CL 104 100 97*  CO2 26 28 29   GLUCOSE 85 79 76  BUN 21 20 29*  CREATININE 0.71 0.73 0.82  CALCIUM  9.3 8.9 9.3   GFR: Estimated Creatinine Clearance: 29.3 mL/min (by C-G formula based on SCr of 0.82 mg/dL).  BNP (last 3 results) Recent Labs    02/17/24 0921 03/30/24 0651  PROBNP 976.0* 1,287.0*   Sepsis  Labs: Recent Labs  Lab 03/30/24 9178  LATICACIDVEN 0.5    Recent Results (from the past 240 hours)  Culture, blood (routine x 2)     Status: None (Preliminary result)   Collection Time: 03/30/24  8:08 AM   Specimen: BLOOD RIGHT ARM  Result Value Ref Range Status   Specimen Description   Final    BLOOD RIGHT ARM Performed at [facility], [address], [place], [place] [address]    Special Requests   Final    BOTTLES DRAWN AEROBIC AND ANAEROBIC Blood Culture adequate volume Performed at [facility], [address] [place], [place] [address]    Culture   Final    NO GROWTH 2 DAYS Performed at [facility], [address], [place], [place] [address]    Report Status PENDING  Incomplete  Culture, blood (routine x 2)     Status: None (Preliminary result)   Collection Time: 03/30/24 10:41 AM   Specimen: BLOOD LEFT ARM  Result Value Ref Range Status   Specimen Description   Final    BLOOD LEFT ARM Performed at [facility], [address], [place], [place] [address]    Special Requests   Final    BOTTLES DRAWN AEROBIC AND ANAEROBIC Blood Culture results may not be optimal due to an inadequate volume of blood received in culture bottles Performed at [facility], [address] [place], [place] [address]    Culture   Final    NO GROWTH 2 DAYS Performed at [facility], [address] [place], [place] [address]    Report Status PENDING  Incomplete  MRSA Next Gen by PCR, Nasal     Status: Abnormal   Collection Time: 03/30/24  1:01 PM   Specimen: Nasal Mucosa; Nasal Swab  Result Value Ref Range Status   MRSA by PCR Next Gen DETECTED (A) NOT DETECTED Final    Comment: (NOTE) The GeneXpert MRSA Assay (FDA approved for NASAL specimens only), is one component of a comprehensive MRSA colonization surveillance program. It is not intended to diagnose MRSA infection nor to guide or monitor treatment for MRSA  infections. Test performance is not FDA approved in patients less than 24 years old. Performed at [facility], [address] [place], [place] [address]          Radiology Studies: DG CHEST PORT 1 VIEW Result Date: 03/31/2024 CLINICAL DATA:  CHF. EXAM: PORTABLE CHEST 1 VIEW COMPARISON:  03/30/2024, 02/17/2024 and 06/20/2023, chest CT 03/30/2024 FINDINGS: Lungs are adequately inflated without lobar consolidation or effusion. Mild stable hazy prominence of the central pulmonary vasculature suggesting mild degree of vascular congestion. Minimal chronic interstitial prominence over the upper lungs. Borderline stable cardiomegaly. Remainder of the exam is unchanged. IMPRESSION: Borderline  stable cardiomegaly with suggestion of mild vascular congestion. Electronically Signed   By: Toribio Agreste M.D.   On: 03/31/2024 07:52        Scheduled Meds:  amLODipine   2.5 mg Oral Daily   Chlorhexidine  Gluconate Cloth  6 each Topical Daily   docusate sodium   100 mg Oral BID   furosemide  20 mg Intravenous BID   guaiFENesin  1,200 mg Oral BID   lidocaine   1 patch Transdermal Q24H   mupirocin  ointment  1 Application Nasal BID   pantoprazole   40 mg Oral Q0600   sodium chloride  flush  3 mL Intravenous Q12H   Continuous Infusions:     LOS: 1 day   Time spent:  Elsie JAYSON Montclair, DO [facility]  If 7PM-7AM, please contact night-coverage [email or web address]  04/01/2024, 4:06 PM

## 2024-04-01 NOTE — Plan of Care (Signed)
  Problem: Clinical Measurements: Goal: Will remain free from infection Outcome: Progressing   Problem: Clinical Measurements: Goal: Diagnostic test results will improve Outcome: Progressing   Problem: Clinical Measurements: Goal: Respiratory complications will improve Outcome: Progressing   Problem: Clinical Measurements: Goal: Cardiovascular complication will be avoided Outcome: Progressing   Problem: Nutrition: Goal: Adequate nutrition will be maintained Outcome: Progressing

## 2024-04-02 DIAGNOSIS — I5033 Acute on chronic diastolic (congestive) heart failure: Secondary | ICD-10-CM | POA: Diagnosis not present

## 2024-04-02 DIAGNOSIS — I509 Heart failure, unspecified: Secondary | ICD-10-CM | POA: Diagnosis not present

## 2024-04-02 NOTE — Plan of Care (Signed)
  Problem: Education: Goal: Knowledge of General Education information will improve Description: Including pain rating scale, medication(s)/side effects and non-pharmacologic comfort measures Outcome: Progressing   Problem: Clinical Measurements: Goal: Ability to maintain clinical measurements within normal limits will improve Outcome: Progressing Goal: Will remain free from infection Outcome: Progressing Goal: Diagnostic test results will improve Outcome: Progressing   Problem: Health Behavior/Discharge Planning: Goal: Ability to manage health-related needs will improve Outcome: Not Progressing

## 2024-04-02 NOTE — Progress Notes (Signed)
 IV removed, copy of AVS given to son, dressed, belongings packed, attempted to call report to [facility], will continue to try and contact for report. Packet with AVS sent to [facility] by CM.

## 2024-04-02 NOTE — [place] FL2 (Signed)
 [place]  MEDICAID [id number] LEVEL OF CARE FORM     IDENTIFICATION  Patient Name: Lauren Davidson Birthdate: March 26, 1922 Sex: female Admission Date (Current Location): 03/30/2024  [facility] and [place] Number:  Producer, Television/film/video and Address:  [facility],  [address], [place] [address]      Provider Number: [id number]  Attending Physician Name and Address:  Lue Elsie BROCKS, MD  Relative Name and Phone Number:  Helayne Grand (son-in-law) Ph: [phone]    Current Level of Care: Hospital Recommended Level of Care: Assisted Living Facility Prior Approval Number:    Date Approved/Denied:   PASRR Number:    Discharge Plan: Other (Comment) [facility])    Current Diagnoses: Patient Active Problem List   Diagnosis Date Noted   CHF (congestive heart failure) (HCC) 03/30/2024   Acute CHF (congestive heart failure) (HCC) 02/17/2024   Fall at home, initial encounter 10/22/2023   Frail elderly 10/22/2023   DNR (do not resuscitate)/DNI(Do Not Intubate) 10/22/2023   Closed fracture of multiple ribs of right side 10/16/2023   HOH (hard of hearing) 05/16/2023   Back pain 09/17/2021   Lumbar spinal stenosis 09/17/2021   HTN (hypertension) 09/17/2021   HLD (hyperlipidemia) 09/17/2021   Gastroesophageal reflux disease without esophagitis 03/01/2019   Sjogren's syndrome 05/18/2015   Chronic kidney disease (CKD), stage II (mild) 05/18/2015   Generalized anxiety disorder 05/18/2015   Osteoporosis 05/18/2015   Status post intraocular lens implant 11/14/2011    Orientation RESPIRATION BLADDER Height & Weight     Self, Time, Situation, Place  Normal Continent Weight: 111 lb 1.8 oz (50.4 kg) Height:  5' 4 (162.6 cm)  BEHAVIORAL SYMPTOMS/MOOD NEUROLOGICAL BOWEL NUTRITION STATUS      Continent Diet (2 gram sodium/2059mL diet)  AMBULATORY STATUS COMMUNICATION OF NEEDS Skin   Limited Assist Verbally Other (Comment) (Erythema: groin)                        Personal Care Assistance Level of Assistance  Bathing, Feeding, Dressing Bathing Assistance: Limited assistance Feeding assistance: Independent Dressing Assistance: Limited assistance     Functional Limitations Info  Sight, Hearing, Speech Sight Info: Impaired Hearing Info: Impaired Speech Info: Adequate    SPECIAL CARE FACTORS FREQUENCY                       Contractures Contractures Info: Not present    Additional Factors Info  Code Status, Allergies Code Status Info: DNR Allergies Info: Statins, Buprenorphine Hcl, Buspirone, Lexapro  (Escitalopram ), Meperidine, Morphine And Codeine           Current Medications (04/02/2024):  This is the current hospital active medication list Current Facility-Administered Medications  Medication Dose Route Frequency Provider Last Rate Last Admin   acetaminophen  (TYLENOL ) tablet 650 mg  650 mg Oral Q4H PRN Laurita Manor T, MD       amLODipine  (NORVASC ) tablet 2.5 mg  2.5 mg Oral Daily Lue Elsie BROCKS, MD   2.5 mg at 04/02/24 9057   artificial tears ophthalmic solution 1 drop  1 drop Both Eyes Q12H PRN Lue Elsie BROCKS, MD   1 drop at 04/01/24 1001   Chlorhexidine  Gluconate Cloth 2 % PADS 6 each  6 each Topical Daily Laurita Manor ONEIDA, MD   6 each at 04/01/24 1003   docusate sodium  (COLACE) capsule 100 mg  100 mg Oral BID Laurita Manor T, MD   100 mg at 04/02/24 [phone]  famotidine (PEPCID) tablet 20 mg  20 mg Oral Daily PRN Lue Elsie BROCKS, MD       furosemide (LASIX) injection 20 mg  20 mg Intravenous BID Laurita Manor T, MD   20 mg at 04/02/24 0941   guaiFENesin (MUCINEX) 12 hr tablet 1,200 mg  1,200 mg Oral BID Laurita Manor T, MD   1,200 mg at 04/02/24 9057   lidocaine  (LIDODERM ) 5 % 1 patch  1 patch Transdermal Q24H Lue Elsie BROCKS, MD   1 patch at 04/02/24 0942   mupirocin  ointment (BACTROBAN ) 2 % 1 Application  1 Application Nasal BID Laurita Manor DASEN, MD   1 Application at 04/02/24 9057   ondansetron  (ZOFRAN )  injection 4 mg  4 mg Intravenous Q6H PRN Laurita Manor T, MD       pantoprazole  (PROTONIX ) EC tablet 40 mg  40 mg Oral Q0600 Lue Elsie BROCKS, MD   40 mg at 04/02/24 9386   sodium chloride  flush (NS) 0.9 % injection 3 mL  3 mL Intravenous Q12H Laurita Manor T, MD   3 mL at 04/02/24 0942   sodium chloride  flush (NS) 0.9 % injection 3 mL  3 mL Intravenous PRN Laurita Manor DASEN, MD         Discharge Medications: Please see discharge summary for a list of discharge medications. TAKE these medications     acetaminophen  500 MG tablet Commonly known as: TYLENOL  Take 1,000 mg by mouth every 6 (six) hours as needed for mild pain (pain score 1-3) or moderate pain (pain score 4-6).    CALCIUM +D3 PO Take 1 tablet by mouth daily.    docusate sodium  100 MG capsule Commonly known as: COLACE Take 1 capsule (100 mg total) by mouth 2 (two) times daily.    famotidine 20 MG tablet Commonly known as: PEPCID Take 20 mg by mouth daily as needed (gerd).    ferrous gluconate  324 MG tablet Commonly known as: FERGON Take 324 mg by mouth daily with breakfast.    furosemide 20 MG tablet Commonly known as: LASIX Take 1 tablet (20 mg total) by mouth daily.    GenTeal Tears 0.1-0.3 % Soln Generic drug: Dextran 70-Hypromellose Place 1 drop into both eyes every 12 (twelve) hours as needed (dry eyes).    ibuprofen  200 MG tablet Commonly known as: ADVIL  Take 200 mg by mouth every 6 (six) hours as needed for mild pain (pain score 1-3) or moderate pain (pain score 4-6).    lidocaine  4 % Place 1 patch onto the skin daily. Apply to lower back    pantoprazole  40 MG tablet Commonly known as: PROTONIX  Take 40 mg by mouth every morning.    PreserVision AREDS 2 Caps Take 1 capsule by mouth in the morning and at bedtime.    Vitamin D  50 MCG (2000 UT) Caps Take 2,000 Units by mouth daily.     Relevant Imaging Results:  Relevant Lab Results:   Additional Information SSN: [id number]  Duwaine GORMAN [name],  LCSW

## 2024-04-02 NOTE — Plan of Care (Signed)

## 2024-04-02 NOTE — TOC Transition Note (Signed)
 Transition of Care [facility]) - Discharge Note  Patient Details  Name: Lauren Davidson MRN: [id number] Date of Birth: 03/16/1922  [facility] CM/SW Contact:  Duwaine GORMAN [name], LCSW Phone Number: 04/02/2024, 12:40 PM  Clinical Narrative: CSW confirmed OP palliative referral with Melissa through [facility]. Patient weaned off oxygen, so no oxygen will need to be set up at this time. HHPT/OT orders placed by hospitalist for [facility]. FL2 completed. CSW faxed FL2 and discharge summary to Jama Baron at [facility] [phone]). CSW updated son and CHARITY FUNDRAISER. Care management signing off.  Final next level of care: Assisted Living Barriers to Discharge: Barriers Resolved  Patient Goals and CMS Choice Patient states their goals for this hospitalization and ongoing recovery are:: Return to [facility] CMS Medicare.gov Compare Post Acute Care list provided to:: Patient Choice offered to / list presented to : Patient  Discharge Plan and Services Additional resources added to the After Visit Summary for   In-house Referral: Clinical Social Work      DME Arranged: N/A DME Agency: NA HH Arranged: PT, OT HH Agency: Other - See comment Damita) Date HH Agency Contacted: 04/01/24 Representative spoke with at [facility] Agency: Referral made in hub  Social Drivers of Health (SDOH) Interventions SDOH Screenings   Food Insecurity: No Food Insecurity (03/30/2024)  Housing: Low Risk  (03/30/2024)  Transportation Needs: No Transportation Needs (03/30/2024)  Utilities: Not At Risk (03/30/2024)  Social Connections: Moderately Integrated (03/30/2024)  Tobacco Use: Low Risk  (03/30/2024)   Readmission Risk Interventions     No data to display

## 2024-04-02 NOTE — Plan of Care (Signed)
  Problem: Education: Goal: Knowledge of General Education information will improve Description: Including pain rating scale, medication(s)/side effects and non-pharmacologic comfort measures 04/02/2024 0813 by Alaina Dozier PARAS, RN Outcome: Adequate for Discharge 04/02/2024 0800 by Alaina Dozier PARAS, RN Outcome: Progressing   Problem: Health Behavior/Discharge Planning: Goal: Ability to manage health-related needs will improve 04/02/2024 0813 by Alaina Dozier PARAS, RN Outcome: Adequate for Discharge 04/02/2024 0800 by Alaina Dozier PARAS, RN Outcome: Not Progressing   Problem: Clinical Measurements: Goal: Ability to maintain clinical measurements within normal limits will improve 04/02/2024 0813 by Alaina Dozier PARAS, RN Outcome: Adequate for Discharge 04/02/2024 0800 by Alaina Dozier PARAS, RN Outcome: Progressing Goal: Will remain free from infection 04/02/2024 0813 by Alaina Dozier PARAS, RN Outcome: Adequate for Discharge 04/02/2024 0800 by Alaina Dozier PARAS, RN Outcome: Progressing Goal: Diagnostic test results will improve 04/02/2024 0813 by Alaina Dozier PARAS, RN Outcome: Adequate for Discharge 04/02/2024 0800 by Alaina Dozier PARAS, RN Outcome: Progressing Goal: Respiratory complications will improve Outcome: Adequate for Discharge Goal: Cardiovascular complication will be avoided Outcome: Adequate for Discharge   Problem: Activity: Goal: Risk for activity intolerance will decrease Outcome: Adequate for Discharge   Problem: Nutrition: Goal: Adequate nutrition will be maintained Outcome: Adequate for Discharge   Problem: Coping: Goal: Level of anxiety will decrease Outcome: Adequate for Discharge   Problem: Elimination: Goal: Will not experience complications related to bowel motility Outcome: Adequate for Discharge Goal: Will not experience complications related to urinary retention Outcome: Adequate for Discharge   Problem:  Pain Managment: Goal: General experience of comfort will improve and/or be controlled Outcome: Adequate for Discharge   Problem: Safety: Goal: Ability to remain free from injury will improve Outcome: Adequate for Discharge   Problem: Skin Integrity: Goal: Risk for impaired skin integrity will decrease Outcome: Adequate for Discharge

## 2024-04-04 LAB — CULTURE, BLOOD (ROUTINE X 2)
Culture: NO GROWTH
Culture: NO GROWTH
Special Requests: ADEQUATE

## 2024-04-11 DIAGNOSIS — I130 Hypertensive heart and chronic kidney disease with heart failure and stage 1 through stage 4 chronic kidney disease, or unspecified chronic kidney disease: Secondary | ICD-10-CM | POA: Diagnosis not present

## 2024-04-11 DIAGNOSIS — I5032 Chronic diastolic (congestive) heart failure: Secondary | ICD-10-CM | POA: Diagnosis not present

## 2024-04-11 DIAGNOSIS — N182 Chronic kidney disease, stage 2 (mild): Secondary | ICD-10-CM | POA: Diagnosis not present

## 2024-04-23 DIAGNOSIS — I10 Essential (primary) hypertension: Secondary | ICD-10-CM | POA: Diagnosis not present

## 2024-04-23 DIAGNOSIS — I129 Hypertensive chronic kidney disease with stage 1 through stage 4 chronic kidney disease, or unspecified chronic kidney disease: Secondary | ICD-10-CM | POA: Diagnosis not present

## 2024-04-23 DIAGNOSIS — N182 Chronic kidney disease, stage 2 (mild): Secondary | ICD-10-CM | POA: Diagnosis not present
